# Patient Record
Sex: Female | Born: 1950
Health system: Southern US, Community
[De-identification: ages and names within clinical notes are randomized; demographics above are authoritative.]

## PROBLEM LIST (undated history)

## (undated) DIAGNOSIS — Z9109 Other allergy status, other than to drugs and biological substances: Secondary | ICD-10-CM

## (undated) DIAGNOSIS — Z9289 Personal history of other medical treatment: Secondary | ICD-10-CM

## (undated) DIAGNOSIS — H269 Unspecified cataract: Secondary | ICD-10-CM

## (undated) DIAGNOSIS — R110 Nausea: Secondary | ICD-10-CM

## (undated) DIAGNOSIS — G43109 Migraine with aura, not intractable, without status migrainosus: Secondary | ICD-10-CM

## (undated) DIAGNOSIS — T7840XA Allergy, unspecified, initial encounter: Secondary | ICD-10-CM

## (undated) DIAGNOSIS — Z8619 Personal history of other infectious and parasitic diseases: Secondary | ICD-10-CM

## (undated) DIAGNOSIS — F32A Depression, unspecified: Secondary | ICD-10-CM

## (undated) DIAGNOSIS — G709 Myoneural disorder, unspecified: Secondary | ICD-10-CM

## (undated) DIAGNOSIS — E785 Hyperlipidemia, unspecified: Secondary | ICD-10-CM

## (undated) DIAGNOSIS — F419 Anxiety disorder, unspecified: Secondary | ICD-10-CM

## (undated) DIAGNOSIS — I10 Essential (primary) hypertension: Secondary | ICD-10-CM

## (undated) DIAGNOSIS — F329 Major depressive disorder, single episode, unspecified: Secondary | ICD-10-CM

## (undated) DIAGNOSIS — K219 Gastro-esophageal reflux disease without esophagitis: Secondary | ICD-10-CM

## (undated) DIAGNOSIS — R748 Abnormal levels of other serum enzymes: Secondary | ICD-10-CM

## (undated) DIAGNOSIS — G039 Meningitis, unspecified: Secondary | ICD-10-CM

## (undated) HISTORY — DX: Personal history of other medical treatment: Z92.89

## (undated) HISTORY — DX: Hyperlipidemia, unspecified: E78.5

## (undated) HISTORY — DX: Essential (primary) hypertension: I10

## (undated) HISTORY — DX: Nausea: R11.0

## (undated) HISTORY — DX: Myoneural disorder, unspecified: G70.9

## (undated) HISTORY — DX: Depression, unspecified: F32.A

## (undated) HISTORY — PX: NASAL SINUS SURGERY: SHX719

## (undated) HISTORY — DX: Migraine with aura, not intractable, without status migrainosus: G43.109

## (undated) HISTORY — DX: Gastro-esophageal reflux disease without esophagitis: K21.9

## (undated) HISTORY — DX: Anxiety disorder, unspecified: F41.9

## (undated) HISTORY — DX: Allergy, unspecified, initial encounter: T78.40XA

## (undated) HISTORY — DX: Major depressive disorder, single episode, unspecified: F32.9

## (undated) HISTORY — DX: Unspecified cataract: H26.9

## (undated) HISTORY — DX: Personal history of other infectious and parasitic diseases: Z86.19

---

## 2000-10-28 ENCOUNTER — Other Ambulatory Visit: Admission: RE | Admit: 2000-10-28 | Discharge: 2000-10-28 | Payer: Self-pay | Admitting: Obstetrics and Gynecology

## 2001-11-07 ENCOUNTER — Other Ambulatory Visit: Admission: RE | Admit: 2001-11-07 | Discharge: 2001-11-07 | Payer: Self-pay | Admitting: Obstetrics and Gynecology

## 2002-11-13 ENCOUNTER — Other Ambulatory Visit: Admission: RE | Admit: 2002-11-13 | Discharge: 2002-11-13 | Payer: Self-pay | Admitting: Obstetrics and Gynecology

## 2003-02-13 ENCOUNTER — Inpatient Hospital Stay (HOSPITAL_COMMUNITY): Admission: EM | Admit: 2003-02-13 | Discharge: 2003-02-17 | Payer: Self-pay | Admitting: Emergency Medicine

## 2003-03-19 ENCOUNTER — Ambulatory Visit (HOSPITAL_COMMUNITY): Admission: RE | Admit: 2003-03-19 | Discharge: 2003-03-19 | Payer: Self-pay | Admitting: Neurology

## 2003-03-19 ENCOUNTER — Encounter (INDEPENDENT_AMBULATORY_CARE_PROVIDER_SITE_OTHER): Payer: Self-pay | Admitting: *Deleted

## 2003-04-02 ENCOUNTER — Encounter (INDEPENDENT_AMBULATORY_CARE_PROVIDER_SITE_OTHER): Payer: Self-pay | Admitting: *Deleted

## 2003-04-02 ENCOUNTER — Encounter (INDEPENDENT_AMBULATORY_CARE_PROVIDER_SITE_OTHER): Payer: Self-pay | Admitting: Neurology

## 2003-04-02 ENCOUNTER — Ambulatory Visit (HOSPITAL_COMMUNITY): Admission: RE | Admit: 2003-04-02 | Discharge: 2003-04-02 | Payer: Self-pay | Admitting: Neurology

## 2003-05-08 ENCOUNTER — Encounter: Admission: RE | Admit: 2003-05-08 | Discharge: 2003-06-24 | Payer: Self-pay | Admitting: Neurology

## 2004-02-26 ENCOUNTER — Other Ambulatory Visit: Admission: RE | Admit: 2004-02-26 | Discharge: 2004-02-26 | Payer: Self-pay | Admitting: Obstetrics and Gynecology

## 2005-04-08 ENCOUNTER — Other Ambulatory Visit: Admission: RE | Admit: 2005-04-08 | Discharge: 2005-04-08 | Payer: Self-pay | Admitting: Obstetrics and Gynecology

## 2005-11-04 ENCOUNTER — Encounter: Admission: RE | Admit: 2005-11-04 | Discharge: 2005-11-04 | Payer: Self-pay | Admitting: Gastroenterology

## 2006-05-10 ENCOUNTER — Other Ambulatory Visit: Admission: RE | Admit: 2006-05-10 | Discharge: 2006-05-10 | Payer: Self-pay | Admitting: Obstetrics and Gynecology

## 2007-05-12 ENCOUNTER — Other Ambulatory Visit: Admission: RE | Admit: 2007-05-12 | Discharge: 2007-05-12 | Payer: Self-pay | Admitting: Obstetrics and Gynecology

## 2008-03-29 HISTORY — PX: LAPAROSCOPIC CHOLECYSTECTOMY: SUR755

## 2008-05-17 ENCOUNTER — Other Ambulatory Visit: Admission: RE | Admit: 2008-05-17 | Discharge: 2008-05-17 | Payer: Self-pay | Admitting: Obstetrics and Gynecology

## 2008-11-19 ENCOUNTER — Emergency Department (HOSPITAL_BASED_OUTPATIENT_CLINIC_OR_DEPARTMENT_OTHER): Admission: EM | Admit: 2008-11-19 | Discharge: 2008-11-19 | Payer: Self-pay | Admitting: Emergency Medicine

## 2008-11-19 ENCOUNTER — Ambulatory Visit: Payer: Self-pay | Admitting: Diagnostic Radiology

## 2009-07-11 HISTORY — PX: ABDOMINOPLASTY: SUR9

## 2010-07-04 LAB — DIFFERENTIAL
Basophils Absolute: 0.1 10*3/uL (ref 0.0–0.1)
Basophils Relative: 1 % (ref 0–1)
Eosinophils Absolute: 0.1 10*3/uL (ref 0.0–0.7)
Eosinophils Relative: 1 % (ref 0–5)
Lymphocytes Relative: 16 % (ref 12–46)
Lymphs Abs: 1.8 10*3/uL (ref 0.7–4.0)
Monocytes Absolute: 0.7 10*3/uL (ref 0.1–1.0)
Monocytes Relative: 7 % (ref 3–12)
Neutro Abs: 8.2 10*3/uL — ABNORMAL HIGH (ref 1.7–7.7)
Neutrophils Relative %: 75 % (ref 43–77)

## 2010-07-04 LAB — COMPREHENSIVE METABOLIC PANEL
ALT: 18 U/L (ref 0–35)
AST: 25 U/L (ref 0–37)
Albumin: 4.1 g/dL (ref 3.5–5.2)
Alkaline Phosphatase: 106 U/L (ref 39–117)
BUN: 16 mg/dL (ref 6–23)
CO2: 28 mEq/L (ref 19–32)
Calcium: 9.5 mg/dL (ref 8.4–10.5)
Chloride: 103 mEq/L (ref 96–112)
Creatinine, Ser: 0.7 mg/dL (ref 0.4–1.2)
GFR calc Af Amer: >60 mL/min (ref >60)
GFR calc non Af Amer: >60 mL/min (ref >60)
Glucose, Bld: 90 mg/dL (ref 70–99)
Potassium: 4 mEq/L (ref 3.5–5.1)
Sodium: 140 mEq/L (ref 135–145)
Total Bilirubin: 0.4 mg/dL (ref 0.3–1.2)
Total Protein: 7.3 g/dL (ref 6.0–8.3)

## 2010-07-04 LAB — URINALYSIS, ROUTINE W REFLEX MICROSCOPIC
Bilirubin Urine: NEGATIVE
Glucose, UA: NEGATIVE mg/dL
Hgb urine dipstick: NEGATIVE
Ketones, ur: NEGATIVE mg/dL
Nitrite: NEGATIVE
Protein, ur: NEGATIVE mg/dL
Specific Gravity, Urine: 1.014 (ref 1.005–1.030)
Urobilinogen, UA: 0.2 mg/dL (ref 0.0–1.0)
pH: 7 (ref 5.0–8.0)

## 2010-07-04 LAB — URINE CULTURE: Colony Count: 3000

## 2010-07-04 LAB — CBC
HCT: 39.9 % (ref 36.0–46.0)
Hemoglobin: 14 g/dL (ref 12.0–15.0)
MCHC: 35.2 g/dL (ref 30.0–36.0)
MCV: 90.5 fL (ref 78.0–100.0)
Platelets: 91 10*3/uL — ABNORMAL LOW (ref 150–400)
RBC: 4.41 MIL/uL (ref 3.87–5.11)
RDW: 12 % (ref 11.5–15.5)
WBC: 10.9 10*3/uL — ABNORMAL HIGH (ref 4.0–10.5)

## 2010-07-04 LAB — POCT CARDIAC MARKERS
CKMB, poc: <1 ng/mL — ABNORMAL LOW (ref 1.0–8.0)
CKMB, poc: <1 ng/mL — ABNORMAL LOW (ref 1.0–8.0)
Myoglobin, poc: 38 ng/mL (ref 12–200)
Myoglobin, poc: 54.1 ng/mL (ref 12–200)
Troponin i, poc: <0.05 ng/mL (ref 0.00–0.09)
Troponin i, poc: <0.05 ng/mL (ref 0.00–0.09)

## 2010-07-04 LAB — URINE MICROSCOPIC-ADD ON

## 2010-07-04 LAB — LIPASE, BLOOD: Lipase: 61 U/L (ref 23–300)

## 2010-07-04 LAB — D-DIMER, QUANTITATIVE: D-Dimer, Quant: <0.22 ug/mL-FEU (ref 0.00–0.48)

## 2010-08-14 NOTE — H&P (Signed)
NAME:  [name], [name]                             ACCOUNT NO.:  [id number]   MEDICAL RECORD NO.:  [id number]                   PATIENT TYPE:  INP   LOCATION:  0460                                 FACILITY:  [facility]   PHYSICIAN:  Corinna L. Lendell Caprice, MD             DATE OF BIRTH:  09-28-1950   DATE OF ADMISSION:  02/13/2003  DATE OF DISCHARGE:                                HISTORY & PHYSICAL   CHIEF COMPLAINT:  Back pain and vomiting.   HISTORY OF PRESENT ILLNESS:  [name] is a 60 year old white female patient  of Dr. Duwayne Heck L. Mahaffey with a several-week history of low back pain.  It has worsened over the past few days, despite being on Percocet and  Celebrex.  She is also having bilateral hip pain.  She has had nausea,  vomiting and anorexia for the past three days.  She is unable to keep even  her medications down, despite Phenergan suppositories.  The pain is so bad  that it has kept her up at nights and she is miserable.  She also had a few  episodes of diarrhea yesterday.   PAST MEDICAL HISTORY:  Chronic sinusitis and allergies for which she  receives weekly shots.   MEDICATIONS:  Phenergan suppositories, Allegra-D, Percocet, Celebrex.   ALLERGIES:  Allergies to COMPAZINE.   SOCIAL HISTORY:  She is married.  She does not work.  She does not smoke,  drink or use drugs.   FAMILY HISTORY:  Family history is noncontributory.   REVIEW OF SYSTEMS:  CONSTITUTIONAL:  No fevers or chills, although she has  been febrile here in the emergency room.  HEENT:  She has had a headache  over the past few days.  She has a lot of postnasal drip because she has  been unable to take her Allegra-D.  RESPIRATORY:  No cough or shortness of  breath.  CARDIOVASCULAR:  No chest pains or palpitations.  GI:  As above.  GU:  No dysuria.  MUSCULOSKELETAL:  As above.  PSYCHIATRIC:  No depression.  NEUROLOGIC:  No seizures.  ENDOCRINE:  No diabetes.  SKIN:  No rash.  HEMATOLOGIC:  No history of  DVT.   PHYSICAL EXAMINATION:  VITAL SIGNS:  On physical examination, her  temperature initially in the emergency room was afebrile but she  subsequently had a temperature of 101.1, blood pressure 158/80, pulse 80,  respiratory rate 16, pulse oximetry 94%.  GENERAL:  In general, patient is very uncomfortable appearing and has  periodic dry heaves.  HEENT:  Normocephalic, atraumatic.  Her eyes are closed with a rag over her  head but she has pupils equal, round and reactive to light.  Sclerae  nonicteric.  She has moist mucous membranes.  NECK:  Neck is supple.  No lymphadenopathy.  LUNGS:  Lungs are clear to auscultation bilaterally without wheezes, rhonchi  or rales.  CARDIOVASCULAR:  Regular rate  and rhythm without murmurs, gallops or rubs.  ABDOMEN:  Normal bowel sounds.  Soft, nontender and nondistended.  GU AND RECTAL:  Deferred.  EXTREMITIES:  No clubbing, cyanosis, or edema.  BACK:  She does have some tenderness of her lumbar spine.  Straight leg  raise is negative.  MUSCULOSKELETAL:  On joint exam, she has good range of motion of the hips  but she is tender over both trochanters.   LABORATORIES:  Her UA has a few squamous epithelial cells, small leukocytes,  negative nitrites, 7 to 10 white cells, a few bacteria.  Complete metabolic  panel is significant for a potassium of 3.3 and a BUN of 26; her creatinine  is normal at 0.7, otherwise, essentially normal.  CBC is normal.   She reportedly had a pelvis film done and lumbar spine films done; I do not  have these results, but the MRI wet reading showed small herniated nucleus  pulposus at L2-3, L3-4 and L4-5 with possible left L5 radiculopathy.   ASSESSMENT AND PLAN:  1. Back pain/herniated nucleus pulposus with possible radiculopathy.  The     patient was seen by Dr. Stefani Dama here in the emergency room, who     recommends conservative treatment.  I will give IV pain medications, IV     steroids, IV Valium until she is  able to take p.o. medications.  He     apparently will continue to follow.  2. Nausea, vomiting and dehydration possibly secondary to pain medications     versus a viral gastroenteritis, as she has a fever currently.  She will     get IV fluids, IV Protonix, n.p.o., except for ice chips, and advance her     diet as tolerated, Zofran, as the Phenergan has not worked very well here     in the emergency room or at home.  3. Fever possibly due to above.  I will, however, check a chest x-ray.  The     urinalysis is not terribly consistent with a bad urinary tract infection,     however, I will repeat this, as it looks a bit contaminated.  4. Chronic sinusitis.  The patient will get Flonase until she is able to     take Allegra-D p.o.                                               Corinna L. Lendell Caprice, MD    CLS/MEDQ  D:  02/13/2003  T:  02/14/2003  Job:  [id number]   cc:   Duwayne Heck L. Mahaffey, M.D.  [address]  [place]  [place] [address]  Fax: [phone]

## 2010-08-14 NOTE — Consult Note (Signed)
NAMENOEL, HENANDEZ                             ACCOUNT NO.:  [id number]   MEDICAL RECORD NO.:  [id number]                   PATIENT TYPE:  INP   LOCATION:  0460                                 FACILITY:  [facility]   PHYSICIAN:  Stefani Dama, M.D.               DATE OF BIRTH:  [birth year]   DATE OF CONSULTATION:  02/13/2003  DATE OF DISCHARGE:                                   CONSULTATION   NEUROSURGERY CONSULTATION:   REQUESTOR:  Carleene Cooper, M.D.   REASON FOR REQUEST:  Back pain, bulging disk.   HISTORY OF PRESENT ILLNESS:  Patient is a 60 year old white female who has a  history of back pain that started a few weeks ago while she was dancing  holding her grandson, the pain localized to her lumbar spine, gradually  resolved after a few days but came back a few days later and was quite  severe in her back and radiating into both hips.  She denies any numbness or  tingling in the legs, denies any weakness in the lower extremities, bowel  and bladder control has not been a problem.  The pain became so severe that  patient was becoming gradually immobilized and presented to the emergency  room at the request of her primary care physician.  An MRI of the lumbar  spine was performed at [facility], this has been reviewed by  me, it demonstrates that she has a mild disk bulge at the level of L4-L5  without any evidence of neurogenic compression.   The patient complains of pain primarily in her low back radiating to both  hips.  There is no numbness and weakness of her legs.  She notes that the  pain is not relieved in any position.   PAST MEDICAL HISTORY:  Not obtained at the current time.   PHYSICAL EXAMINATION:  Her physical examination reveals that she is alert  and arousable to voice; however, she has considerable slurring of speech  secondary to the significant narcotic medications given in the emergency  department.  She moves very slowly and cautiously and can  turn onto her side  with a significant amount of back pain and spasm.  She is able to bear  weight on her legs and her motor strength is good in the iliopsoas, quad,  tibialis anterior, and her gastrocs.  Tone and bulk are normal.  Deep tendon  reflexes are 2+ in the patellae and the Achilles.  Babinski's are downgoing.  Palpation of her back reproduces some modest back pain in the lumbar spine,  no significant paraspinous muscle spasm is noted, however, there is great  tenderness over the palpation of the greater trochanters and Patrick's sign  is markedly positive.  The straight leg raising is negative to 60 degrees  save for some localized back pain.   IMPRESSION:  Patient has evidence of significant pain  in both hip regions  and in the low back to some degree.  She does not have a surgical lesion.  She does have a slight bulge of the disk at L4-5 which is normal considering  her age and I am not certain is the cause of her significant back and hip  pain.  At this point I would advise conservative treatment using a  nonsteroidal anti-inflammatory, some mild muscle relaxers such as Flexeril  and a pain medication for acute pain.  She could be considered to have a  lumbar epidural steroid injection if no other contraindication exists.  Furthermore, I feel evaluation of both hips is appropriate to rule out  particularly avascular  necrosis of the hips.  I am not certain that the patient has any underlying  cause that would lead this into consideration.  Other causes of an  inflammatory arthropathy should be worked up including evaluation with a sed  rate, rheumatoid factor and test for lupus if it is a consideration.  Follow  up will be on an as-requested basis.                                               Stefani Dama, M.D.    Merla Riches  D:  02/13/2003  T:  02/14/2003  Job:  [id number]

## 2010-08-14 NOTE — Discharge Summary (Signed)
[name], [name]                             ACCOUNT NO.:  [id number]   MEDICAL RECORD NO.:  [id number]                   PATIENT TYPE:  INP   LOCATION:  0460                                 FACILITY:  [facility]   PHYSICIAN:  Jackie Plum, M.D.             DATE OF BIRTH:  May 28, 1950   DATE OF ADMISSION:  02/13/2003  DATE OF DISCHARGE:  02/17/2003                                 DISCHARGE SUMMARY   DISCHARGE DIAGNOSES:  1. Nausea, vomiting, anorexia secondary to pain medication, possibly.     Resolved at time of discharge.  2. Low back pain improved significantly.  3. Left hip pain likely musculoskeletal.  4. History of chronic sinusitis and allergies.   DISCHARGE MEDICATIONS:  Darvocet-N 100 one to two tablets p.o. q.4-6h.  p.r.n. and prednisone 40 mg p.o. once daily for today and tomorrow.  Patient  is to resume her preadmission medications.   RADIOLOGIC INVESTIGATIONS:  1. MRI of the spine showed small to moderate right paracentral/foraminal     disk protrusion at T12-L1 which may contact central nerve roots.  2. Mild diffuse disk bulges at L2-3, L3-4, L4-5 with very small eccentric     paracentral/foraminal disk protrusions at these levels.  No evidence of     central or neural foraminal narrowing.  3. Bilateral hip x-rays on February 14, 2003 showed no acute findings.   CONSULTANT:  Dr. Danielle Dess of neurosurgery.   PROCEDURES:  Not applicable.   CONDITION ON DISCHARGE:  Improved and stable.   DISCHARGE LABORATORIES:  WBC count 9.2, hemoglobin 14.4, hematocrit 41.1,  MCV 87.7, platelet count 205,000.  Sodium 135, potassium 3.5, chloride 107,  CO2 26, glucose 134, BUN 21, creatinine 0.6.   ACTIVITIES:  As tolerated.   DIET:  Regular diet.   FOLLOWUP:  Followup appointment will be with Dr. Adonis Housekeeper, patient's primary  care physician, next week.  She is to call for appointment.  She is to  report to MD if there is any increasing pain or any other problems.   REASON FOR  HOSPITALIZATION:  Nausea, vomiting and intractable low back pain.   The patient is a 60 year old Caucasian lady who presented with several-week  history of low back pain worse for last few days prior to admission as well  as bilateral hip pain.  She had been nauseous with some vomiting for 2 days  prior to admission.  Unable to keep even medication down.                                               Jackie Plum, M.D.    GO/MEDQ  D:  02/17/2003  T:  02/17/2003  Job:  [id number]   cc:   Duwayne Heck L. Mahaffey, M.D.  [address]  [place]  [place]  [address]  Fax: [phone]

## 2010-08-14 NOTE — Discharge Summary (Signed)
[name], [name]                             ACCOUNT NO.:  [id number]   MEDICAL RECORD NO.:  [id number]                   PATIENT TYPE:  INP   LOCATION:  0460                                 FACILITY[facility]   PHYSICIAN:  Jackie Plum, M.D.             DATE OF BIRTH:  [birth year]   DATE OF ADMISSION:  02/13/2003  DATE OF DISCHARGE:                                 DISCHARGE SUMMARY   CONTINUATION:  In view of patient's persistent nausea and vomiting and anterior lobar pain,  it was thought expedient to admit patient to the hospital for further  evaluation.  Please see the admission H&P dictated by Dr. ___________  February 13, 2003, for further information regarding the patient's admission  symptoms, signs and assessment and plan at time.   HOSPITAL COURSE:  The patient was admitted to the hospitalist service.  She  was seen by Dr. Danielle Dess of neurosurgery, who, after reviewing the patient and  the patient's films, told that she does not have a physical lesion and that  this bulge at L4 to L5 may be normal for her age.  He advised conservative  treatment only with a nonsteroidal anti-inflammatory with a muscle relaxant.  The plan was that should these conservative treatments fail, then lumbar  epidural steroid injection may be appropriate.  He also suggested evaluation  of the hips to rule out avascular necrosis.  With muscle relaxants, pain  medications, the patient's symptoms have improved.  She has not been  nauseous or vomiting ovary the last 24 hours prior to discharge.  She is  able to keep food down without any problems.  She has been able to ambulate  in the hallways without any significant debilitating pain.  She feels that  she is strong enough and well enough to go home today, and she is being  discharged home with outpatient follow-up.  The patient has some bilateral  hip pain which was x-rayed and results as noted above (unremarkable).  I  believe that the cause of the  patient's hip pain may be musculoskeletal and  if supportive care with pain medicines fail, at that point, an MRI may be  necessary due to the concern for avascular necrosis is not obtained.                                               Jackie Plum, M.D.    GO/MEDQ  D:  02/17/2003  T:  02/17/2003  Job:  [id number]   cc:   Stefani Dama, M.D.  [address]  [place]  [place] [address]  Fax: [phone]

## 2011-01-19 ENCOUNTER — Other Ambulatory Visit: Payer: Self-pay | Admitting: Gastroenterology

## 2011-01-26 ENCOUNTER — Ambulatory Visit
Admission: RE | Admit: 2011-01-26 | Discharge: 2011-01-26 | Disposition: A | Discharge status: Home / Self Care | Payer: 59 | Source: Ambulatory Visit | Attending: Gastroenterology | Admitting: Gastroenterology

## 2011-01-26 ENCOUNTER — Other Ambulatory Visit: Payer: Self-pay

## 2011-08-09 ENCOUNTER — Ambulatory Visit (INDEPENDENT_AMBULATORY_CARE_PROVIDER_SITE_OTHER): Payer: 59 | Admitting: Surgery

## 2011-08-09 ENCOUNTER — Encounter (INDEPENDENT_AMBULATORY_CARE_PROVIDER_SITE_OTHER): Payer: Self-pay | Admitting: Surgery

## 2011-08-09 VITALS — BP 138/82 | HR 84 | Resp 18 | Ht 62.0 in | Wt 171.0 lb

## 2011-08-09 DIAGNOSIS — K429 Umbilical hernia without obstruction or gangrene: Secondary | ICD-10-CM | POA: Insufficient documentation

## 2011-08-09 NOTE — Patient Instructions (Signed)
Umbilical Herniorrhaphy A herniorrhaphy is surgery to repair a hernia. A hernia is a gap or weakness in the muscles of your abdomen. Umbilical means that your hernia is in the area around your belly button. You might be able to feel a small bulge in your abdomen where the hernia is. You might also have pain or discomfort. If the hernia is not repaired, the gap could get bigger. Your intestines could get trapped in the gap. This can be painful. It also can lead to other health problems, such as blocked intestines. LET YOUR CAREGIVER KNOW ABOUT:  Any allergies.   All medications you are taking, including:   Herbs, eyedrops, over-the-counter medications and cream   Blood thinners (anticoagulants), aspirin or other drugs that could affect blood clotting.   Use of steroids (by mouth or as creams).   Previous problems with anesthetics, including local anesthetics.   Possibility of pregnancy, if this applies.   Any history of blood clots.   Any history of bleeding or other blood problems.   Previous surgery.   Smoking history.   Other health problems.  RISKS AND COMPLICATIONS  Short-term possibilities include:   Pain.   Excessive bleeding.   Hematoma. This is a pooling of blood under the wound.   Infection at the surgery site or of the mesh.   Numbness at the surgery site.   Swelling and bruising.   Slow healing.   Blood clots.   Intestine or bowel damage. This is rare.   Longer-term possibilities include:   Scarring.   Skin damage.   The need for additional surgery.   Another hernia.  BEFORE THE PROCEDURE  Stop using aspirin and non-steroidal anti-inflammatory drugs (NSAIDs) for pain relief. Also stop taking vitamin E. If possible, do this two weeks in advance.   If you take blood-thinners, ask your healthcare provider when you should stop taking them.   Do not eat or drink for about 8 hours before your surgery.   You might be asked to shower or wash with a  special antibacterial soap before the procedure.   Wear clothes that will be easy to put on after the surgery.   Arrive at least an hour before the surgery, or whenever your surgeon recommends. This will give you time to check in and fill out any needed paperwork.   This surgery is usually an outpatient procedure, so you will be able to go home the same day. Less often people stay overnight in the hospital after the procedure. Ask your healthcare provider what to expect. Either way, make arrangements in advance for someone to drive you home. After an outpatient procedure, you should have someone stay with you overnight.  PROCEDURE Your procedure can be done with traditional surgery. The surgeon opens the abdomen and repairs the hernia. Or, sometimes it can be done with laparoscopic surgery. Then the procedure is done through multiple small incisions, using a camera to guide the repair. Talk with your surgeon about how the hernia will be repaired.  The preparation:   You will change into a hospital gown.   You will be given an IV. A needle will be inserted in your arm. Medication can flow directly into your body through this needle.   You might be given a sedative to help you relax.   You may be given a drug that will put you to sleep during the surgery (general anesthetic). Or, your abdomen will be numbed, and you will be drowsy but awake (local   anesthetic).   For a traditional surgery (sometimes called open surgery):   Once you are pain-free, the surgeon will make a small cut (incision) in your abdomen.   The gap in the muscle wall will be repaired. The surgeon could sew muscle together over the gap. Or, a mesh or soft screen material can be used to strengthen the area. The mesh acts as a scaffolding and the body grows new strong tissue into and around it. This new tissue is what closes the gap of the hernia and prevents it from coming back.   A drain might be put in. Fluid sometimes  collects under the wound as it heals. The drain helps get the fluid out of the area. A drain will probably be used if your hernia is large.   The surgeon will close the incision with small stitches.   For a laparoscopic surgery:   One you are pain-free, the surgeon will make a small incision in your abdomen.   A thin tube with a tiny camera attached to it will be inserted into the abdomen through the incision. What the camera "sees" is projected on a screen in the room. This gives the surgeon a good view of the hernia.   Other small incisions will be made so the surgeon can insert small tools that are used to repair the hernia.   The incisions will be closed with small stitches.  AFTER THE PROCEDURE  You will be stay in a recovery area until the anesthesia has worn off. Your blood pressure and pulse will be checked every so often.   You might be asked to get up and try walking.   Sometimes people can go home the same day. For others, an overnight stay is needed.  HOME CARE INSTRUCTIONS   Take any medication that your surgeon prescribed. Follow the directions carefully. Take all of the medication.   Ask your surgeon whether you can take over-the-counter medicines for pain, discomfort or fever. Do not take aspirin unless your healthcare team says that you should. Aspirin increases the chances of bleeding.   Do not get the incision area wet for the first few days after surgery (or until your surgeon says it is OK).   Avoid lifting heavy objects (more than 10 lbs, 4.5 kilograms) for 6 to 8 weeks after your surgery.   Expect some pain when climbing stairs for several days after surgery.   Avoid sexual activity for a few weeks. It can be uncomfortable or painful.   You should be able to drive within a few days. However, do not drive until you are no longer taking pain medicine. It can make you drowsy. It also can slow your reaction time.   You should be able to resume normal activity in  a few days. When you can return to work will depend on the type of work you do. You can go back to a desk job much sooner than you can return to work that requires physical labor. Talk about this with your healthcare provider.  SEEK MEDICAL CARE IF:   You notice blood or fluid leaking from the wound.   The area around the incision becomes red or swollen.   You are having problems urinating.   You become nauseous or throw up for more than two days after the surgery.   You develop a fever of more than 100.5 F (38.1 C).  SEEK IMMEDIATE MEDICAL CARE IF:  You develop a fever of more than   102.0 F (38.9 C). Document Released: 06/11/2008 Document Revised: 03/04/2011 Document Reviewed: 06/11/2008 [employer] Patient Information 2012 [employer]. 

## 2011-08-09 NOTE — Progress Notes (Signed)
Chief Complaint  Patient presents with  . New Evaluation    evaluate for possible ventral hernia after abdominoplasty - referral from Dr. Delia Chimes    HISTORY: The patient is a 61 year old white female who underwent abdominoplasty in May 2012. Approximately 2 weeks following her procedure she had an episode of nausea and vomiting. She felt a "pop" at the level of the umbilicus.  Patient then noted a small bulge which was more evident in a standing position then when in a recumbent position. This has not significantly changed over the past year. It does cause occasional discomfort when pressure is applied to this point on the abdominal wall.  Previous abdominal surgery includes laparoscopic cholecystectomy.  Past Medical History  Diagnosis Date  . GERD (gastroesophageal reflux disease)   . Neuromuscular disorder     arachnoiditis     Current Outpatient Prescriptions  Medication Sig Dispense Refill  . ARIPiprazole (ABILIFY) 2 MG tablet Take 2 mg by mouth daily.      . clonazePAM (KLONOPIN) 0.5 MG tablet Take 0.5 mg by mouth 2 (two) times daily.      Marland Kitchen Dexlansoprazole (DEXILANT PO) Take 40 mg by mouth daily.      . DULoxetine (CYMBALTA) 30 MG capsule Take 30 mg by mouth daily.      . DULoxetine (CYMBALTA) 60 MG capsule Take 60 mg by mouth at bedtime.      . fentaNYL (DURAGESIC - DOSED MCG/HR) 100 MCG/HR Place 1 patch onto the skin every 3 (three) days.      Marland Kitchen Fexofenadine-Pseudoephedrine (ALLEGRA-D PO) Take by mouth as needed.      . gabapentin (NEURONTIN) 600 MG tablet Take 600 mg by mouth. x5      . HYDROcodone-acetaminophen (VICODIN) 5-500 MG per tablet Take 2 tablets by mouth every 6 (six) hours as needed.         Allergies  Allergen Reactions  . Compazine (Prochlorperazine)     hyperactivity  . Morphine And Related Hives and Itching     No family history on file.   History   Social History  . Marital Status: Married    Spouse Name: N/A    Number of Children: N/A    . Years of Education: N/A   Social History Main Topics  . Smoking status: Former Smoker    Quit date: 08/09/1986  . Smokeless tobacco: None  . Alcohol Use: No  . Drug Use: No  . Sexually Active: None   Other Topics Concern  . None   Social History Narrative  . None     REVIEW OF SYSTEMS - PERTINENT POSITIVES ONLY: Intermittent pain with pressure at the umbilicus, no signs or symptoms of intestinal obstruction, no prior hernia repairs  EXAM: Filed Vitals:   08/09/11 1525  BP: 138/82  Pulse: 84  Resp: 18    HEENT: normocephalic; pupils equal and reactive; sclerae clear; dentition good; mucous membranes moist NECK:  symmetric on extension; no palpable anterior or posterior cervical lymphadenopathy; no supraclavicular masses; no tenderness CHEST: clear to auscultation bilaterally without rales, rhonchi, or wheezes CARDIAC: regular rate and rhythm without significant murmur; peripheral pulses are full ABDOMEN: soft without distension; bowel sounds present; no mass; no hepatosplenomegaly; Well-healed surgical incisions consistent with previous abdominoplasty; patient is examined both standing and recumbent. There is slight tenderness at the umbilicus. With Valsalva I can appreciate a small umbilical hernia. The fascial defect measured less than 1 cm. It is reducible. It is mildly tender. EXT:  non-tender  without edema; no deformity NEURO: no gross focal deficits; no sign of tremor   LABORATORY RESULTS: See [facility] (CHL-Epic) for most recent results   RADIOLOGY RESULTS: See [facility] (CHL-Epic) for most recent results   IMPRESSION: #1 reducible umbilical hernia, mildly symptomatic #2 status post abdominoplasty #3 status post laparoscopic cholecystectomy  PLAN: I provided the patient with written literature regarding umbilical hernia repair. We discussed the options for management. At this point the patient has a very small fascial defect at the level of  the umbilicus which likely measures less than 1 cm in size. Patient has mild pain on a rare occasion. The hernia has been essentially stable for one year.  Open repair of her umbilical hernia would put the umbilicus at risk for ischemia or infarction. I explained this to the patient.  An alternative approach would be laparoscopic repair. Certainly this would be a more involved procedure and would necessitate at least an overnight hospital stay.  I offered to reevaluate the patient in 6 months. If the hernia has not increased in size and she has not become more symptomatic, I believe it can be observed. Certainly if the hernia enlarges or if she experiences more discomfort or develops any signs or symptoms of intestinal obstruction, and the patient will have to undergo repair. At that point I would recommend a laparoscopic approach.  Patient will return in 6 months for evaluation. She will contact me in the interim if she develops more symptoms.  Velora Heckler, MD, FACS General & Endocrine Surgery [facility]   Visit Diagnoses: 1. Umbilical hernia     Primary Care Physician: Raynelle Jan., MD, MD  Plastic Surgeon:  Dr. Delia Chimes

## 2011-10-13 LAB — HM MAMMOGRAPHY

## 2011-11-01 LAB — HM PAP SMEAR: HM Pap smear: NEGATIVE

## 2012-03-29 DIAGNOSIS — Z9289 Personal history of other medical treatment: Secondary | ICD-10-CM

## 2012-03-29 HISTORY — DX: Personal history of other medical treatment: Z92.89

## 2012-05-03 ENCOUNTER — Encounter (HOSPITAL_COMMUNITY): Payer: Self-pay | Admitting: *Deleted

## 2012-05-03 ENCOUNTER — Other Ambulatory Visit: Payer: Self-pay | Admitting: Gastroenterology

## 2012-05-03 NOTE — Pre-Procedure Instructions (Signed)
Your procedure is scheduled ZO:[id number] 07,2014 Report to [facility] Admitting at:1100Call this number if you have problems morning of your procedure:[phone]  Follow all bowel prep instructions per your doctor's orders.  Do not eat or drink anything after midnight the night before your procedure. You may brush your teeth, rinse out your mouth, but no water, no food, no chewing gum, no mints, no candies, no chewing tobacco.     Take these medicines the morning of your procedure with A SIP OF WATER:Gabapentin,Dexilant and mat take pain medication  Please make arrangements for a responsible person to drive you home after the procedure. You cannot go home by cab/taxi. We recommend you have someone with you at home the first 24 hours after your procedure. Driver for procedure is spouse Tondra Reierson [phone]  LEAVE ALL VALUABLES, JEWELRY, BILLFOLD AT HOME.  NO DENTURES, CONTACT LENSES ALLOWED IN THE ENDOSCOPY ROOM.   YOU MAY WEAR DEODORANT, PLEASE REMOVE ALL JEWELRY, WATCHES RINGS, BODY PIERCINGS AND LEAVE AT HOME.   WOMEN: NO MAKE-UP, LOTIONS PERFUMES

## 2012-05-05 ENCOUNTER — Encounter (HOSPITAL_COMMUNITY): Payer: Self-pay | Admitting: *Deleted

## 2012-05-05 ENCOUNTER — Encounter (HOSPITAL_COMMUNITY): Payer: Self-pay | Admitting: Anesthesiology

## 2012-05-05 ENCOUNTER — Ambulatory Visit (HOSPITAL_COMMUNITY): Payer: 59 | Admitting: Anesthesiology

## 2012-05-05 ENCOUNTER — Encounter (HOSPITAL_COMMUNITY)
Admission: RE | Disposition: A | Discharge status: Home / Self Care | Payer: Self-pay | Source: Ambulatory Visit | Attending: Gastroenterology

## 2012-05-05 ENCOUNTER — Encounter (HOSPITAL_COMMUNITY): Payer: Self-pay | Admitting: Gastroenterology

## 2012-05-05 ENCOUNTER — Ambulatory Visit (HOSPITAL_COMMUNITY)
Admission: RE | Admit: 2012-05-05 | Discharge: 2012-05-05 | Disposition: A | Discharge status: Home / Self Care | Payer: 59 | Source: Ambulatory Visit | Attending: Gastroenterology | Admitting: Gastroenterology

## 2012-05-05 DIAGNOSIS — R748 Abnormal levels of other serum enzymes: Secondary | ICD-10-CM | POA: Insufficient documentation

## 2012-05-05 DIAGNOSIS — R1013 Epigastric pain: Secondary | ICD-10-CM | POA: Insufficient documentation

## 2012-05-05 DIAGNOSIS — K219 Gastro-esophageal reflux disease without esophagitis: Secondary | ICD-10-CM | POA: Insufficient documentation

## 2012-05-05 HISTORY — DX: Meningitis, unspecified: G03.9

## 2012-05-05 HISTORY — DX: Abnormal levels of other serum enzymes: R74.8

## 2012-05-05 HISTORY — DX: Other allergy status, other than to drugs and biological substances: Z91.09

## 2012-05-05 HISTORY — PX: EUS: SHX5427

## 2012-05-05 SURGERY — UPPER ENDOSCOPIC ULTRASOUND (EUS) LINEAR
Anesthesia: General

## 2012-05-05 MED ORDER — SODIUM CHLORIDE 0.9 % IV SOLN: INTRAVENOUS | Status: DC

## 2012-05-05 MED ORDER — MIDAZOLAM HCL 5 MG/5ML IJ SOLN
INTRAMUSCULAR | Status: DC | PRN
  Administered 2012-05-05: 2 mg via INTRAVENOUS

## 2012-05-05 MED ORDER — LACTATED RINGERS IV SOLN
INTRAVENOUS | Status: DC | PRN
  Administered 2012-05-05: 12:00:00 via INTRAVENOUS

## 2012-05-05 MED ORDER — PROPOFOL 10 MG/ML IV BOLUS
INTRAVENOUS | Status: DC | PRN
  Administered 2012-05-05: 120 mg via INTRAVENOUS

## 2012-05-05 MED ORDER — DEXAMETHASONE SODIUM PHOSPHATE 10 MG/ML IJ SOLN
INTRAMUSCULAR | Status: DC | PRN
  Administered 2012-05-05: 10 mg via INTRAVENOUS

## 2012-05-05 MED ORDER — FENTANYL CITRATE 0.05 MG/ML IJ SOLN
INTRAMUSCULAR | Status: DC | PRN
  Administered 2012-05-05: 100 ug via INTRAVENOUS

## 2012-05-05 MED ORDER — ONDANSETRON HCL 4 MG/2ML IJ SOLN
INTRAMUSCULAR | Status: DC | PRN
  Administered 2012-05-05: 4 mg via INTRAVENOUS

## 2012-05-05 MED ORDER — LACTATED RINGERS IV SOLN
INTRAVENOUS | Status: DC
  Administered 2012-05-05: 12:00:00 via INTRAVENOUS

## 2012-05-05 MED ORDER — SUCCINYLCHOLINE CHLORIDE 20 MG/ML IJ SOLN
INTRAMUSCULAR | Status: DC | PRN
  Administered 2012-05-05: 100 mg via INTRAVENOUS

## 2012-05-05 MED ORDER — ONDANSETRON HCL 4 MG/2ML IJ SOLN: 4.0000 mg | Freq: Once | INTRAMUSCULAR | Status: DC | PRN

## 2012-05-05 MED ORDER — PROPOFOL 10 MG/ML IV BOLUS: INTRAVENOUS | Status: DC | PRN

## 2012-05-05 NOTE — Op Note (Signed)
[facility] [address] [place], [address]   ENDOSCOPIC ULTRASOUND PROCEDURE REPORT  PATIENT: [name], [name]  MR#: [id number] BIRTHDATE: 26-Nov-1950  GENDER: Female ENDOSCOPIST: Jeani Hawking, MD REFERRED BY: PROCEDURE DATE:  05/05/2012 PROCEDURE:   Upper EUS ASA CLASS:      Class II INDICATIONS:   1.  Epigastric pain and abnormal liver enzymes. MEDICATIONS: See Anesthesia Report.  DESCRIPTION OF PROCEDURE:   After the risks benefits and alternatives of the procedure were  explained, informed consent was obtained. The patient was then placed in the left, lateral, decubitus postion and IV sedation was administered. Throughout the procedure, the patients blood pressure, pulse and oxygen saturations were monitored continuously.  Under direct visualization, the Pentax Linear [id number]  endoscope was introduced through the mouth  and advanced to the second portion of the duodenum .  Water was used as necessary to provide an acoustic interface.  Upon completion of the imaging, water was removed and the patient was sent to the recovery room in satisfactory condition.   FINDINGS: An excellent view of the CBD was able to be obtained.  The entire CBD was clearly visualized and there was no evidence of any retained stones or sludge.  The CBD diameter was noted to be a 9-10 mm.  The PD was also normal in caliber and course.  No evidence of any pancreatic masses.   The scope was then withdrawn from the patient and the procedure completed.  COMPLICATIONS: There were no complications. ENDOSCOPIC VISUALIZATION:    ENDOSCOPIC IMPRESSION: 1) Midly dilated CBD at 9-10 mm.  No other abnormalities identified.  RECOMMENDATIONS: 1) Repeat liver enzymes in one week. 2) ? trial of a PPI. 3) Follow up with Dr.  Loreta Ave as scheduled.   _______________________________ eSignedJeani Hawking, MD 05/05/2012 1:10 PM

## 2012-05-05 NOTE — Anesthesia Postprocedure Evaluation (Signed)
Anesthesia Post Note  Patient: [name]  Procedure(s) Performed: Procedure(s) (LRB): UPPER ENDOSCOPIC ULTRASOUND (EUS) LINEAR (N/A) ENDOSCOPIC RETROGRADE CHOLANGIOPANCREATOGRAPHY (ERCP) (N/A)  Anesthesia type: General  Patient location: PACU  Post pain: Pain level controlled  Post assessment: Patient's Cardiovascular Status Stable  Last Vitals:  Filed Vitals:   05/05/12 1340  BP: 125/79  Pulse:   Temp:   Resp: 14    Post vital signs: Reviewed and stable  Level of consciousness: alert  Complications: No apparent anesthesia complications

## 2012-05-05 NOTE — Preoperative (Signed)
Beta Blockers   Reason not to administer Beta Blockers:Not Applicable 

## 2012-05-05 NOTE — Anesthesia Preprocedure Evaluation (Signed)
Anesthesia Evaluation  Patient identified by MRN, date of birth, ID band Patient awake    Reviewed: Allergy & Precautions, H&P , NPO status , Patient's Chart, lab work & pertinent test results, reviewed documented beta blocker date and time   Airway Mallampati: II TM Distance: >3 FB Neck ROM: full    Dental   Pulmonary neg pulmonary ROS,  breath sounds clear to auscultation        Cardiovascular negative cardio ROS  Rhythm:regular     Neuro/Psych  Neuromuscular disease negative neurological ROS  negative psych ROS   GI/Hepatic Neg liver ROS, GERD-  Medicated and Controlled,  Endo/Other  negative endocrine ROS  Renal/GU negative Renal ROS  negative genitourinary   Musculoskeletal   Abdominal   Peds  Hematology negative hematology ROS (+)   Anesthesia Other Findings See surgeon's H&P   Reproductive/Obstetrics negative OB ROS                           Anesthesia Physical Anesthesia Plan  ASA: II  Anesthesia Plan: General   Post-op Pain Management:    Induction: Intravenous  Airway Management Planned: Oral ETT  Additional Equipment:   Intra-op Plan:   Post-operative Plan: Extubation in OR  Informed Consent: I have reviewed the patients History and Physical, chart, labs and discussed the procedure including the risks, benefits and alternatives for the proposed anesthesia with the patient or authorized representative who has indicated his/her understanding and acceptance.   Dental Advisory Given  Plan Discussed with: CRNA and Surgeon  Anesthesia Plan Comments:         Anesthesia Quick Evaluation

## 2012-05-05 NOTE — Transfer of Care (Signed)
Immediate Anesthesia Transfer of Care Note  Patient: [name]  Procedure(s) Performed: Procedure(s) (LRB) with comments: UPPER ENDOSCOPIC ULTRASOUND (EUS) LINEAR (N/A) ENDOSCOPIC RETROGRADE CHOLANGIOPANCREATOGRAPHY (ERCP) (N/A)  Patient Location: PACU and Endoscopy Unit  Anesthesia Type:General  Level of Consciousness: awake, alert  and patient cooperative  Airway & Oxygen Therapy: Patient Spontanous Breathing and Patient connected to face mask oxygen  Post-op Assessment: Report given to PACU RN and Post -op Vital signs reviewed and stable  Post vital signs: Reviewed and stable  Complications: No apparent anesthesia complications

## 2012-05-05 NOTE — H&P (Signed)
  [name] HPI: This is a 62 year old female who was recently treated at [facility] for choledocholithiasis.  She presented with upper abdominal pain and nausea/vomiting.  Initially her liver enzymes were normal, but as her symptoms worsened she was admitted to the hospital and her enzymes were noted to be elevated.  Her TB was at 1.7, but her AP was in the 300 range as well as the AST/ALT.  She ruled out for any viral hepatidites.  The CT scan and the ultrasound was negative for any abnormalities, but the MRCP revealed two filling defects.  An ERCP was performed and the stones were extracted.  Her liver enzymes did decline, but it never normalized and her symptoms also do not resolve.  She presented to Dr. Loreta Ave and her most recent AP was at 175 and her ALT was at 37.  With her persistent symptoms the decision was made to perform an EUS +/- ERCP.    Past Medical History  Diagnosis Date  . GERD (gastroesophageal reflux disease)   . Neuromuscular disorder     arachnoiditis  . Arachnoiditis   . Environmental allergies   . Elevated liver enzymes     Past Surgical History  Procedure Date  . Abdominoplasty 07/11/09  . Cholecystectomy 2010    History reviewed. No pertinent family history.  Social History:  reports that she quit smoking about 25 years ago. She has never used smokeless tobacco. She reports that she does not drink alcohol or use illicit drugs.  Allergies:  Allergies  Allergen Reactions  . Compazine (Prochlorperazine)     hyperactivity  . Morphine And Related Hives and Itching    Medications:  Scheduled:  Continuous:   . sodium chloride    . lactated ringers 125 mL/hr at 05/05/12 1217    No results found for this or any previous visit (from the past 24 hour(s)).   No results found.  ROS:  As stated above in the HPI otherwise negative.  Blood pressure 122/68, pulse 75, temperature 98.2 F (36.8 C), temperature source Oral, resp. rate 15, height  5\' 2"  (1.575 m), weight 140 lb (63.504 kg), SpO2 93.00%.    PE: Gen: NAD, Alert and Oriented HEENT:  /AT, EOMI Neck: Supple, no LAD Lungs: CTA Bilaterally CV: RRR without M/G/R ABM: Soft, NTND, +BS Ext: No C/C/E  Assessment/Plan: 1) History of choledocholithiasis. 2) Abnormal liver enzymes. 3) Persistent nausea and RUQ pain.  Plan: 1) EUS +/- ERCP.  Renada Cronin D 05/05/2012, 12:16 PM

## 2012-05-08 ENCOUNTER — Encounter (HOSPITAL_COMMUNITY): Payer: Self-pay | Admitting: Gastroenterology

## 2012-06-26 ENCOUNTER — Encounter: Payer: Self-pay | Admitting: Obstetrics and Gynecology

## 2012-06-27 ENCOUNTER — Encounter: Payer: Self-pay | Admitting: Obstetrics and Gynecology

## 2012-06-27 ENCOUNTER — Institutional Professional Consult (permissible substitution): Payer: Self-pay | Admitting: Obstetrics and Gynecology

## 2012-07-05 ENCOUNTER — Telehealth: Payer: Self-pay | Admitting: Obstetrics and Gynecology

## 2012-07-05 ENCOUNTER — Institutional Professional Consult (permissible substitution): Payer: Self-pay | Admitting: Obstetrics and Gynecology

## 2012-07-05 NOTE — Telephone Encounter (Signed)
Pt cancelled appt for this afternoon because she is sick. Will call back to reschedule.

## 2012-07-05 NOTE — Telephone Encounter (Signed)
What was the appt for?

## 2012-07-05 NOTE — Telephone Encounter (Signed)
Never mind [name].  I see that it was discuss vaginal dryness.  All is ok then and we don't need to do anything.

## 2012-11-01 ENCOUNTER — Ambulatory Visit: Payer: Self-pay | Admitting: Obstetrics and Gynecology

## 2012-11-01 ENCOUNTER — Telehealth: Payer: Self-pay | Admitting: Obstetrics and Gynecology

## 2012-11-01 NOTE — Telephone Encounter (Signed)
Patient cancelled AEX appointment today due to "coming down with something and not feeling well." Rescheduled for 11/15/12 with Dr. Tresa Res.

## 2012-11-15 ENCOUNTER — Encounter: Payer: Self-pay | Admitting: Obstetrics and Gynecology

## 2012-11-15 ENCOUNTER — Ambulatory Visit (INDEPENDENT_AMBULATORY_CARE_PROVIDER_SITE_OTHER): Payer: 59 | Admitting: Obstetrics and Gynecology

## 2012-11-15 VITALS — BP 118/60 | HR 72 | Resp 12 | Ht 62.0 in | Wt 147.2 lb

## 2012-11-15 DIAGNOSIS — Z01419 Encounter for gynecological examination (general) (routine) without abnormal findings: Secondary | ICD-10-CM

## 2012-11-15 DIAGNOSIS — Z0000 Encounter for general adult medical examination without abnormal findings: Secondary | ICD-10-CM

## 2012-11-15 LAB — POCT URINALYSIS DIPSTICK
Spec Grav, UA: 1.02
pH, UA: 6

## 2012-11-15 MED ORDER — ESTRADIOL 0.1 MG/GM VA CREA: TOPICAL_CREAM | VAGINAL | Status: DC

## 2012-11-15 NOTE — Progress Notes (Addendum)
62 y.o.   Married    Caucasian   female   G1P0010   here for annual exam.  Having work up for chronic nausea.  No positive dx yet.  Not sexually active in 2 years.  Tried to have sex, even using olive oil, and it is very painful.  Wants to be able to have sex.  Discussed option for vaginal estrogen and also po osphena, and pt wants to use the cream. Pt very stressed at home and is tearful today.     No LMP recorded. Patient is postmenopausal.          Sexually active: no  The current method of family planning is vasectomy.    Exercising: n/a Last mammogram:  10/2012 Last pap smear: 11/01/2011  Negative   History of abnormal pap: no Smoking: no Alcohol: no Last colonoscopy: 10/2005 Last Bone Density:  2007 Last tetanus shot: 2010 Last cholesterol check: 03/2012  Hgb: PCP maintains all labs               Urine:  negative   Family History  Problem Relation Age of Onset  . Alzheimer's disease Mother   . Hypertension Mother   . Stroke Mother   . Migraines Mother   . Hypertension Father     Patient Active Problem List   Diagnosis Date Noted  . Umbilical hernia 08/09/2011    Past Medical History  Diagnosis Date  . GERD (gastroesophageal reflux disease)   . Neuromuscular disorder     arachnoiditis  . Arachnoiditis   . Environmental allergies   . Elevated liver enzymes   . Depression   . Migraines   . H/O being hospitalized 03/2012    x1 week for nausea  . Nausea     chronic nausea     Past Surgical History  Procedure Laterality Date  . Abdominoplasty  07/11/09  . Cholecystectomy  2010  . Eus N/A 05/05/2012    Procedure: UPPER ENDOSCOPIC ULTRASOUND (EUS) LINEAR;  Surgeon: Theda Belfast, MD;  Location: [facility] ENDOSCOPY;  Service: Endoscopy;  Laterality: N/A;    Allergies: Compazine and Morphine and related  Current Outpatient Prescriptions  Medication Sig Dispense Refill  . CARAFATE 1 GM/10ML suspension 100 mLs.      . clonazePAM (KLONOPIN) 0.5 MG tablet Take 0.5 mg by mouth  2 (two) times daily.      Marland Kitchen Dexlansoprazole (DEXILANT PO) Take 40 mg by mouth daily.      Marland Kitchen dronabinol (MARINOL) 10 MG capsule Take 60 mg by mouth.      Marland Kitchen FLUoxetine (PROZAC) 20 MG capsule 20 mg.      . gabapentin (NEURONTIN) 600 MG tablet Take 600 mg by mouth. x5      . oxyCODONE (ROXICODONE) 15 MG immediate release tablet Take 15 mg by mouth every 4 (four) hours as needed.      . promethazine (PHENERGAN) 25 MG tablet 25 mg.       No current facility-administered medications for this visit.    ROS: Pertinent items are noted in HPI.  Social Hx: married, 2 adopted step daughters, homemaker   Exam:    BP 118/60  Pulse 72  Resp 12  Ht 5\' 2"  (1.575 m)  Wt 147 lb 3.2 oz (66.769 kg)  BMI 26.92 kg/m2Ht stable and wt down 21 pounds from last year   Wt Readings from Last 3 Encounters:  11/15/12 147 lb 3.2 oz (66.769 kg)  05/05/12 140 lb (63.504 kg)  05/05/12  140 lb (63.504 kg)     Ht Readings from Last 3 Encounters:  11/15/12 5\' 2"  (1.575 m)  05/05/12 5\' 2"  (1.575 m)  05/05/12 5\' 2"  (1.575 m)    General appearance: alert, cooperative and appears stated age Head: Normocephalic, without obvious abnormality, atraumatic Neck: no adenopathy, supple, symmetrical, trachea midline and thyroid not enlarged, symmetric, no tenderness/mass/nodules Lungs: clear to auscultation bilaterally Breasts: Inspection negative, No nipple retraction or dimpling, No nipple discharge or bleeding, No axillary or supraclavicular adenopathy, Normal to palpation without dominant masses Heart: regular rate and rhythm Abdomen: soft, non-tender; bowel sounds normal; no masses,  no organomegaly Extremities: extremities normal, atraumatic, no cyanosis or edema Skin: Skin color, texture, turgor normal. No rashes or lesions Lymph nodes: Cervical, supraclavicular, and axillary nodes normal. No abnormal inguinal nodes palpated Neurologic: Grossly normal   Pelvic: External genitalia:  no lesions               Urethra:  normal appearing urethra with no masses, tenderness or lesions              Bartholins and Skenes: normal                 Vagina: normal appearing vagina with normal color and discharge, no lesions              Cervix: normal appearance              Pap taken: no        Bimanual Exam:  Uterus:  uterus is normal size, shape, consistency and nontender                                      Adnexa: normal adnexa in size, nontender and no masses                                      Rectovaginal: Confirms                                      Anus:  normal sphincter tone, no lesions  A: normal menopausal exam, no HRT     H/o simple hyperplasia w/o atypia in 2004     Depression/anxiety; GERD; migraines     H/o accessory ribs     P:     mammogram counseled on breast self exam, mammography screening, adequate intake of calcium and vitamin D, diet and exercise return annually or prn   Pt is seeing a psychiatrist and feels very upset by her family situation.  We discussed the effects of anxiety on the GI tract.  Pt is worried that her husband will leave her beacause she can't have sex.  Will start estrace vaginal cream, and pt instructed.     An After Visit Summary was printed and given to the patient.   11/18/2012 Pt decided she would prefer the pill for vag dryness to the cream.  Rx for osphena 1 po qd sent with RF for 1 year.  CPRomine MD

## 2012-11-15 NOTE — Patient Instructions (Signed)

## 2012-11-17 ENCOUNTER — Telehealth: Payer: Self-pay | Admitting: Obstetrics and Gynecology

## 2012-11-17 NOTE — Telephone Encounter (Signed)
Spoke to patient regarding Estrace Cream. Patient would rather have pill medication. States the cream is too much bother to fool with in keeping applicator clean. States she did use x  2 nights. Pharmacy [facility]. Please advise.

## 2012-11-17 NOTE — Telephone Encounter (Signed)
Patient was given Estrace Cream to use on Wednesday. She has since decided that she would rather have something in pill form.

## 2012-11-18 MED ORDER — OSPEMIFENE 60 MG PO TABS: 1.0000 | ORAL_TABLET | Freq: Every day | ORAL | Status: DC

## 2012-11-18 NOTE — Telephone Encounter (Signed)
I sent the rx to her pharmacy for Osphena.

## 2012-11-18 NOTE — Addendum Note (Signed)
Addended by: Alison Murray on: 11/18/2012 12:23 PM   Modules accepted: Orders

## 2012-11-20 NOTE — Telephone Encounter (Signed)
Left a message on answering machine telling patient new Rx at [facility]

## 2012-12-27 DIAGNOSIS — R110 Nausea: Secondary | ICD-10-CM | POA: Diagnosis not present

## 2013-02-01 DIAGNOSIS — R141 Gas pain: Secondary | ICD-10-CM | POA: Diagnosis not present

## 2013-02-01 DIAGNOSIS — K5900 Constipation, unspecified: Secondary | ICD-10-CM | POA: Diagnosis not present

## 2013-02-01 DIAGNOSIS — R110 Nausea: Secondary | ICD-10-CM | POA: Diagnosis not present

## 2013-02-27 DIAGNOSIS — R112 Nausea with vomiting, unspecified: Secondary | ICD-10-CM | POA: Diagnosis not present

## 2013-02-27 DIAGNOSIS — IMO0002 Reserved for concepts with insufficient information to code with codable children: Secondary | ICD-10-CM | POA: Diagnosis not present

## 2013-02-27 DIAGNOSIS — G039 Meningitis, unspecified: Secondary | ICD-10-CM | POA: Diagnosis not present

## 2013-04-17 DIAGNOSIS — IMO0001 Reserved for inherently not codable concepts without codable children: Secondary | ICD-10-CM | POA: Diagnosis not present

## 2013-04-17 DIAGNOSIS — J069 Acute upper respiratory infection, unspecified: Secondary | ICD-10-CM | POA: Diagnosis not present

## 2013-04-19 DIAGNOSIS — R112 Nausea with vomiting, unspecified: Secondary | ICD-10-CM | POA: Diagnosis not present

## 2013-04-19 DIAGNOSIS — IMO0002 Reserved for concepts with insufficient information to code with codable children: Secondary | ICD-10-CM | POA: Diagnosis not present

## 2013-04-19 DIAGNOSIS — G039 Meningitis, unspecified: Secondary | ICD-10-CM | POA: Diagnosis not present

## 2013-06-14 DIAGNOSIS — R5381 Other malaise: Secondary | ICD-10-CM | POA: Diagnosis not present

## 2013-06-14 DIAGNOSIS — M359 Systemic involvement of connective tissue, unspecified: Secondary | ICD-10-CM | POA: Diagnosis not present

## 2013-06-14 DIAGNOSIS — E039 Hypothyroidism, unspecified: Secondary | ICD-10-CM | POA: Diagnosis not present

## 2013-06-14 DIAGNOSIS — D518 Other vitamin B12 deficiency anemias: Secondary | ICD-10-CM | POA: Diagnosis not present

## 2013-06-14 DIAGNOSIS — R7989 Other specified abnormal findings of blood chemistry: Secondary | ICD-10-CM | POA: Diagnosis not present

## 2013-06-14 DIAGNOSIS — R6889 Other general symptoms and signs: Secondary | ICD-10-CM | POA: Diagnosis not present

## 2013-06-14 DIAGNOSIS — E2839 Other primary ovarian failure: Secondary | ICD-10-CM | POA: Diagnosis not present

## 2013-06-14 DIAGNOSIS — R5383 Other fatigue: Secondary | ICD-10-CM | POA: Diagnosis not present

## 2013-06-19 DIAGNOSIS — M47817 Spondylosis without myelopathy or radiculopathy, lumbosacral region: Secondary | ICD-10-CM | POA: Diagnosis not present

## 2013-06-19 DIAGNOSIS — G039 Meningitis, unspecified: Secondary | ICD-10-CM | POA: Diagnosis not present

## 2013-07-16 DIAGNOSIS — E781 Pure hyperglyceridemia: Secondary | ICD-10-CM | POA: Diagnosis not present

## 2013-07-30 DIAGNOSIS — K589 Irritable bowel syndrome without diarrhea: Secondary | ICD-10-CM | POA: Diagnosis not present

## 2013-09-26 DIAGNOSIS — G039 Meningitis, unspecified: Secondary | ICD-10-CM | POA: Diagnosis not present

## 2013-09-26 DIAGNOSIS — M47817 Spondylosis without myelopathy or radiculopathy, lumbosacral region: Secondary | ICD-10-CM | POA: Diagnosis not present

## 2013-11-16 ENCOUNTER — Ambulatory Visit: Payer: 59 | Admitting: Gynecology

## 2013-11-27 DIAGNOSIS — Z0000 Encounter for general adult medical examination without abnormal findings: Secondary | ICD-10-CM | POA: Diagnosis not present

## 2013-11-27 DIAGNOSIS — K219 Gastro-esophageal reflux disease without esophagitis: Secondary | ICD-10-CM | POA: Diagnosis not present

## 2013-11-27 DIAGNOSIS — E669 Obesity, unspecified: Secondary | ICD-10-CM | POA: Diagnosis not present

## 2013-11-27 DIAGNOSIS — E781 Pure hyperglyceridemia: Secondary | ICD-10-CM | POA: Diagnosis not present

## 2013-11-27 DIAGNOSIS — F411 Generalized anxiety disorder: Secondary | ICD-10-CM | POA: Diagnosis not present

## 2013-11-27 DIAGNOSIS — J309 Allergic rhinitis, unspecified: Secondary | ICD-10-CM | POA: Diagnosis not present

## 2013-11-27 DIAGNOSIS — R110 Nausea: Secondary | ICD-10-CM | POA: Diagnosis not present

## 2013-11-27 DIAGNOSIS — F339 Major depressive disorder, recurrent, unspecified: Secondary | ICD-10-CM | POA: Diagnosis not present

## 2013-11-28 DIAGNOSIS — G039 Meningitis, unspecified: Secondary | ICD-10-CM | POA: Diagnosis not present

## 2013-12-04 ENCOUNTER — Encounter: Payer: Self-pay | Admitting: Gynecology

## 2013-12-04 ENCOUNTER — Ambulatory Visit (INDEPENDENT_AMBULATORY_CARE_PROVIDER_SITE_OTHER): Payer: 59 | Admitting: Gynecology

## 2013-12-04 VITALS — BP 140/80 | HR 78 | Resp 16 | Ht 61.0 in | Wt 144.0 lb

## 2013-12-04 DIAGNOSIS — G039 Meningitis, unspecified: Secondary | ICD-10-CM

## 2013-12-04 DIAGNOSIS — N63 Unspecified lump in unspecified breast: Secondary | ICD-10-CM

## 2013-12-04 DIAGNOSIS — Z01419 Encounter for gynecological examination (general) (routine) without abnormal findings: Secondary | ICD-10-CM

## 2013-12-04 DIAGNOSIS — K5900 Constipation, unspecified: Secondary | ICD-10-CM

## 2013-12-04 DIAGNOSIS — IMO0002 Reserved for concepts with insufficient information to code with codable children: Secondary | ICD-10-CM | POA: Diagnosis not present

## 2013-12-04 DIAGNOSIS — N6310 Unspecified lump in the right breast, unspecified quadrant: Secondary | ICD-10-CM

## 2013-12-04 NOTE — Patient Instructions (Signed)
miralax-poly-ethylene glycol powder glycerine suppository

## 2013-12-04 NOTE — Progress Notes (Addendum)
Patient scheduled for Diagnostic Mammogram of  R Breast with R Breast Ultrasound at [facility] for 12/11/13 at 1300. Patient agreeable. Order faxed to [facility].

## 2013-12-04 NOTE — Progress Notes (Signed)
63 y.o. Married Caucasian female   G1P0010 here for annual exam. Pt reports menses are absent due to menopause. She does not report hot flashes, once in awhile have night sweats, does have vaginal dryness.  She is not using lubricants,.  She does not report post-menopasual bleeding.  Pt reports no vaginal penetration for 3y as a result of pain.  Pt never used osphena due to GI issues.    Patient's last menstrual period was 12/28/2011.          Sexually active: No.  The current method of family planning is post menopausal status and Husband has Vasectomy.    Exercising: No.  The patient does not participate in regular exercise at present. Last pap: 11/01/11 NEG HR HPV Abnormal PAP: no Mammogram: 11/08/13 Bi-Rads 1 BSE: yes  Colonoscopy: 11/19/2005-2 polyps f/u in 10 years DEXA: 2007 Alcohol: no Tobacco: no  Labs:  Ward Givens, MD   Health Maintenance  Topic Date Due  . Tetanus/tdap  12/27/1969  . Zostavax  12/28/2010  . Influenza Vaccine  10/27/2013  . Pap Smear  11/01/2014  . Mammogram  11/09/2015  . Colonoscopy  11/20/2015    Family History  Problem Relation Age of Onset  . Alzheimer's disease Mother   . Hypertension Mother   . Stroke Mother   . Migraines Mother   . Hypertension Father     Patient Active Problem List   Diagnosis Date Noted  . Umbilical hernia 43/15/4008    Past Medical History  Diagnosis Date  . GERD (gastroesophageal reflux disease)   . Neuromuscular disorder     arachnoiditis  . Arachnoiditis   . Environmental allergies   . Elevated liver enzymes   . Depression   . Migraines   . H/O being hospitalized 03/2012    x1 week for nausea  . Nausea     chronic nausea     Past Surgical History  Procedure Laterality Date  . Abdominoplasty  07/11/09  . Cholecystectomy  2010  . Eus N/A 05/05/2012    Procedure: UPPER ENDOSCOPIC ULTRASOUND (EUS) LINEAR;  Surgeon: Beryle Beams, MD;  Location: [facility];  Service: Endoscopy;  Laterality: N/A;     Allergies: Compazine and Morphine and related  Current Outpatient Prescriptions  Medication Sig Dispense Refill  . CARAFATE 1 GM/10ML suspension 100 mLs.      . clonazePAM (KLONOPIN) 0.5 MG tablet Take 0.5 mg by mouth 2 (two) times daily.      Marland Kitchen Dexlansoprazole (DEXILANT PO) Take 40 mg by mouth daily.      Marland Kitchen dronabinol (MARINOL) 10 MG capsule Take 60 mg by mouth.      . estradiol (ESTRACE) 0.1 MG/GM vaginal cream 1/2 g pv qhs x 2 wks, then 1/2 g pv 2-3 times q wk  42.5 g  3  . FLUoxetine (PROZAC) 20 MG capsule 20 mg.      . gabapentin (NEURONTIN) 600 MG tablet Take 600 mg by mouth. x5      . Ospemifene (OSPHENA) 60 MG TABS Take 1 tablet by mouth daily.  30 tablet  12  . oxyCODONE (ROXICODONE) 15 MG immediate release tablet Take 15 mg by mouth every 4 (four) hours as needed.      . promethazine (PHENERGAN) 25 MG tablet 25 mg.       No current facility-administered medications for this visit.    ROS: Pertinent items are noted in HPI.  Exam:    LMP 12/28/2011 Weight change: @WEIGHTCHANGE @ Last 3 height  recordings:  Ht Readings from Last 3 Encounters:  11/15/12 5\' 2"  (1.575 m)  05/05/12 5\' 2"  (1.575 m)  05/05/12 5\' 2"  (1.575 m)   General appearance: alert, cooperative and appears stated age Head: Normocephalic, without obvious abnormality, atraumatic Neck: no adenopathy, no carotid bruit, no JVD, supple, symmetrical, trachea midline and thyroid not enlarged, symmetric, no tenderness/mass/nodules Lungs: clear to auscultation bilaterally Breasts: Inspection negative, No nipple retraction or dimpling, No nipple discharge or bleeding, No axillary or supraclavicular adenopathy, positive findings: tenderness on right at 8 o'clock, fibrocystic like changes, felt best in upright position, left negative Heart: regular rate and rhythm, S1, S2 normal, no murmur, click, rub or gallop Abdomen: soft, non-tender; bowel sounds normal; no masses,  no organomegaly Extremities: extremities normal,  atraumatic, no cyanosis or edema Skin: Skin color, texture, turgor normal. No rashes or lesions Lymph nodes: Cervical, supraclavicular, and axillary nodes normal. no inguinal nodes palpated Neurologic: Grossly normal   Pelvic: External genitalia:  no lesions              Urethra: normal appearing urethra with no masses, tenderness or lesions              Bartholins and Skenes: Bartholin's, Urethra, Skene's normal                 Vagina: atrophic, rusty discharge, large amount of hard stool in rectum limiting exam, affecting depth, tenderness at edge of stool on left              Cervix: normal appearance              Pap taken: No.        Bimanual Exam:  Uterus:  uterus is normal size, shape, consistency and nontender                                      Adnexa:    no masses                                      Rectovaginal: Deferred                                      Anus:  defer exam       1. Routine gynecological examination  counseled on breast self exam, adequate intake of calcium and vitamin D, diet and exercise return annually or prn Discussed PAP guideline changes, importance of weight bearing exercises, calcium, vit D and balanced diet.  2. Dyspareunia Evaluation limited by stool in rectum Pt with atrophic changes, normal length and aperture Will rto after chronic constipation resolved so RVE can be done to assess left sided tenderness  3. Unspecified constipation Many GI issues, on zofran for nausea, recommend using miralax to soften stool and help with evacuation so pelvic exam can be done, may contribute to dyspareunia  4. Mass of breast, right Right dx mammogram and u/s  An After Visit Summary was printed and given to the patient.

## 2013-12-14 ENCOUNTER — Telehealth: Payer: Self-pay | Admitting: Gynecology

## 2013-12-14 NOTE — Telephone Encounter (Signed)
Advised patient per Dr. Orpah Greek note did want patient to return and have vaginal exam. Patient states she used miralax and suppository and is feeling much better. Patient requests mid-afternoon appointment. Appointment scheduled for 01/04/14 at 1200. Patient agreeable. Routing to provider for final review. Patient agreeable to disposition. Will close encounter

## 2013-12-14 NOTE — Telephone Encounter (Signed)
Pt is not sure if she need an appointment to come back in to see Dr Charlies Constable.

## 2013-12-17 DIAGNOSIS — N644 Mastodynia: Secondary | ICD-10-CM | POA: Diagnosis not present

## 2013-12-25 ENCOUNTER — Ambulatory Visit (INDEPENDENT_AMBULATORY_CARE_PROVIDER_SITE_OTHER): Payer: 59 | Admitting: Family

## 2013-12-25 ENCOUNTER — Encounter: Payer: Self-pay | Admitting: Family

## 2013-12-25 VITALS — BP 135/75 | HR 65 | Temp 98.5°F | Resp 16 | Ht 61.75 in | Wt 146.4 lb

## 2013-12-25 DIAGNOSIS — Z23 Encounter for immunization: Secondary | ICD-10-CM

## 2013-12-25 DIAGNOSIS — F341 Dysthymic disorder: Secondary | ICD-10-CM

## 2013-12-25 DIAGNOSIS — F419 Anxiety disorder, unspecified: Secondary | ICD-10-CM

## 2013-12-25 DIAGNOSIS — F32A Depression, unspecified: Secondary | ICD-10-CM | POA: Insufficient documentation

## 2013-12-25 DIAGNOSIS — G039 Meningitis, unspecified: Secondary | ICD-10-CM | POA: Diagnosis not present

## 2013-12-25 DIAGNOSIS — R110 Nausea: Secondary | ICD-10-CM

## 2013-12-25 DIAGNOSIS — F329 Major depressive disorder, single episode, unspecified: Secondary | ICD-10-CM | POA: Insufficient documentation

## 2013-12-25 MED ORDER — ESCITALOPRAM OXALATE 10 MG PO TABS: 10.0000 mg | ORAL_TABLET | Freq: Every day | ORAL | Status: DC

## 2013-12-25 MED ORDER — OMEPRAZOLE 40 MG PO CPDR: 40.0000 mg | DELAYED_RELEASE_CAPSULE | Freq: Every day | ORAL | Status: DC

## 2013-12-25 NOTE — Assessment & Plan Note (Signed)
This is managed by Dr. Marijean Bravo- neurology. She continues to have chronic low back pain.

## 2013-12-25 NOTE — Assessment & Plan Note (Addendum)
I think most likely cause for her chronic nausea is her duragesic patch. IBS may also be a contributing factor given her irregular bowel patterns. I have advised her to speak with her neurologist about changing her duragesic patch to another med.  We also discussed that SSRI's can cause nausea, however her nausea has been present x 2 years and she started paxil 8 months ago without worsening of her symptoms. Will add empiric PPI to see if this helps. Continue miralax to prevent constipation.

## 2013-12-25 NOTE — Progress Notes (Signed)
Pre visit review using our clinic review tool, if applicable. No additional management support is needed unless otherwise documented below in the visit note. 

## 2013-12-25 NOTE — Patient Instructions (Signed)
Stop Paxil, start lexapro.   Start omeprazole Follow up in 1 month.

## 2013-12-25 NOTE — Assessment & Plan Note (Signed)
Uncontrolled. Will try switching paxil to lexapro to see if this helps.  If no significant improvement, consider addition of SNRI.

## 2013-12-25 NOTE — Progress Notes (Signed)
Subjective:    Patient ID: [name], female    DOB: Mar 25, 1951, 63 y.o.   MRN: [id number]  HPI  Pt new to establish care. He primary concern today is chronic nausea x 2 years. Has seen multiple doctors and cause has not been determined.    Doesn't remember last tetanus, would like flu and pneumonia vaccines. Last colonoscopy 2010. Has upcoming pap smear 01/04/14, last mammogram 12/17/13.  Reports that initially she "could barely eat."  Has seen Dr. Zandra Abts.  Was referred back to Dr. Marijean Bravo her neurologist.  Reports several endoscopies in the last 2 years.   Chart reviewed- pt had choledocholithiasis 2/14 and underwent EUS and ultimately underwent cholecystectomy. She has known umbilical hernia which is followed by Dr. Harlow Asa. In addition to seeing Dr. Lacinda Axon at [facility] for [facility], she has also seen Dr. Benson Norway and Dr. Collene Mares in the past.    Reports that she had a stent placed endoscopically following her cholecystectomy which was subsequently removed.  Dr. Benson Norway also did an endoscopy Reports last cholonoscopy 7 years ago.   Occasional GERD.  She has seen Dr. Collene Mares who had her on dexilant. However she has not been on Dexilant or a PPI in a long time. Reports that she also had a normal gastric emptying study.  .    Reports that her BM's are "feast or famine."  She takes miralax every day  She uses odansetron prn for nausea. Reports stress worsens the nausea.  Has occasional associated vomiting.  Stopped working due to nausea.    Weighed 200 pounds 2 years ago. Weight is now 146.    Anxiety/Depression- reports that she has been on paxil for 8 months.  Continues to have anxiety, irritability, anorexia.   Uses klonazepam, daily tearfulness. She uses zquil to sleep or clonazepam. Otherwise she wakes up 2-3 times a night.  She is working with a Transport planner.    Arachnoiditis- Fentanyl patch- x 7 years. Initially was on 12.37mcg. Now on 50 mcg. This is prescribed by Dr. Marijean Bravo her neurologist for her chronic  low back pain.   Review of Systems See HPI  Past Medical History  Diagnosis Date  . GERD (gastroesophageal reflux disease)   . Arachnoiditis   . Environmental allergies   . Elevated liver enzymes   . Depression   . Migraine with typical aura     resolved years ago  . H/O being hospitalized 03/2012    x1 week for nausea  . Nausea     chronic nausea   . History of chicken pox   . Allergy     seasonal    History   Social History  . Marital Status: Married    Spouse Name: N/A    Number of Children: N/A  . Years of Education: N/A   Occupational History  . Not on file.   Social History Main Topics  . Smoking status: Former Smoker    Quit date: 08/09/1986  . Smokeless tobacco: Never Used  . Alcohol Use: No  . Drug Use: No  . Sexual Activity: No     Comment: husband had vasectomy.    Other Topics Concern  . Not on file   Social History Narrative   2 step daughters- 66 and 49   She worked as an Web designer.   Enjoys puzzles, counted cross stitch.   Completed 1 year of college   4 cats    Past Surgical History  Procedure Laterality Date  .  Abdominoplasty  07/11/09  . Eus N/A 05/05/2012    Procedure: UPPER ENDOSCOPIC ULTRASOUND (EUS) LINEAR;  Surgeon: Beryle Beams, MD;  Location: [facility];  Service: Endoscopy;  Laterality: N/A;  . Laparoscopic cholecystectomy  2010  . Nasal sinus surgery      "Has had 2-3 surgeries over the years"    Family History  Problem Relation Age of Onset  . Alzheimer's disease Mother   . Hypertension Mother   . Migraines Mother   . Hypertension Father   . Other Father     ?carotid artery aneurysm    Allergies  Allergen Reactions  . Compazine [Prochlorperazine]     hyperactivity  . Morphine And Related Hives and Itching    Current Outpatient Prescriptions on File Prior to Visit  Medication Sig Dispense Refill  . clonazePAM (KLONOPIN) 0.5 MG tablet Take 0.5 mg by mouth 2 (two) times daily.      . fentaNYL  (DURAGESIC - DOSED MCG/HR) 50 MCG/HR See admin instructions. Every 3 days      . gabapentin (NEURONTIN) 600 MG tablet Take 600 mg by mouth. x5      . ondansetron (ZOFRAN) 8 MG tablet daily.       No current facility-administered medications on file prior to visit.    BP 135/75  Pulse 65  Temp(Src) 98.5 F (36.9 C) (Oral)  Resp 16  Ht 5' 1.75" (1.568 m)  Wt 146 lb 6.4 oz (66.407 kg)  BMI 27.01 kg/m2  SpO2 95%  LMP 03/29/1993       Objective:   Physical Exam  Constitutional: She is oriented to person, place, and time. She appears well-developed and well-nourished. No distress.  HENT:  Head: Normocephalic and atraumatic.  Cardiovascular: Normal rate and regular rhythm.   No murmur heard. Pulmonary/Chest: Effort normal and breath sounds normal. No respiratory distress. She has no wheezes. She has no rales. She exhibits no tenderness.  Abdominal: Soft. Bowel sounds are normal. She exhibits no distension and no mass. There is no tenderness. There is no rebound and no guarding.  Neurological: She is alert and oriented to person, place, and time.  Psychiatric: Her behavior is normal. Judgment and thought content normal.  Mildly anxious, flat affect, intermittently tearful          Assessment & Plan:  55 minutes spent with pt today.  >50% of this time was spent counseling patient on anxiety, depression, chronic nausea.

## 2014-01-04 ENCOUNTER — Ambulatory Visit: Payer: Medicare Other | Admitting: Gynecology

## 2014-01-04 ENCOUNTER — Telehealth: Payer: Self-pay | Admitting: Gynecology

## 2014-01-04 ENCOUNTER — Telehealth: Payer: Self-pay | Admitting: Family

## 2014-01-04 NOTE — Telephone Encounter (Signed)
Caller name: Kashia Relation to pt: Call back [place]:  Reason for call:  Pt wonders if her vagus nerve is causing her nausea.  She had her bladder removed, and she thinks this might be her issues.    Pt would like to possibly if you think she should.

## 2014-01-04 NOTE — Telephone Encounter (Signed)
FYI--patient called and cx for today due to Follow up/RVE/tf /pt rs due to up sick all night/[place]

## 2014-01-06 NOTE — Telephone Encounter (Signed)
That would be unlikely, I think that the duragesic patch is most likely cause.  I would recommend that she contact her neurologist- Dr. Marijean Bravo to discuss alternative treatment besides the fentanyl patch.

## 2014-01-07 NOTE — Telephone Encounter (Signed)
Notified pt and she states she has contacted Dr Barbera Setters office. He is out of the office until tomorrow and she is awaiting their return call.

## 2014-01-14 ENCOUNTER — Other Ambulatory Visit: Payer: Self-pay | Admitting: Family

## 2014-01-14 NOTE — Telephone Encounter (Signed)
Caller name: Aeon  Call back number:[phone]   Reason for call: pt states that she has a hx of nausea but her new Rx escitalopram (LEXAPRO) 10 MG tablet is making her more nauseous.  Advise.

## 2014-01-14 NOTE — Telephone Encounter (Signed)
[name] called to see if someone was going to call her back, I told her that you usually call back at the end of day after seeing patients.

## 2014-01-14 NOTE — Telephone Encounter (Signed)
Spoke with pt, she states she has been taking lexapro every morning.  Nausea is so bad that she states she can hardly get out of bed and feels sick all day.  Please advise.

## 2014-01-15 MED ORDER — VENLAFAXINE HCL ER 37.5 MG PO CP24: ORAL_CAPSULE | ORAL | Status: DC

## 2014-01-15 NOTE — Telephone Encounter (Signed)
Spoke with pt, she is agreeable to proceed with Effexor XR. Rx sent and f/u scheduled for 02/13/14 at 1:15pm. Cancelled 01/24/14 appt.

## 2014-01-15 NOTE — Telephone Encounter (Signed)
If she would like to come off of lexapro, then I would recommend a different med for anxiety/depression such as effexor.  If she would like to start, I will send to her pharmacy and she will need to follow up in 1 month (pended below).

## 2014-01-16 ENCOUNTER — Ambulatory Visit: Payer: Medicare Other | Admitting: Gynecology

## 2014-01-16 NOTE — Telephone Encounter (Signed)
Pt rescheduled her appointment for today because she is sick.

## 2014-01-17 ENCOUNTER — Telehealth: Payer: Self-pay | Admitting: Family Medicine

## 2014-01-17 ENCOUNTER — Other Ambulatory Visit: Payer: Self-pay | Admitting: Family

## 2014-01-17 DIAGNOSIS — F329 Major depressive disorder, single episode, unspecified: Secondary | ICD-10-CM

## 2014-01-17 DIAGNOSIS — F32A Depression, unspecified: Secondary | ICD-10-CM

## 2014-01-17 DIAGNOSIS — F419 Anxiety disorder, unspecified: Principal | ICD-10-CM

## 2014-01-17 MED ORDER — PAROXETINE HCL 30 MG PO TABS: 30.0000 mg | ORAL_TABLET | Freq: Every day | ORAL | Status: DC

## 2014-01-17 NOTE — Telephone Encounter (Signed)
Received call from patient's psychiatrist and therapist Dr. Elane Fritz at [place]. She reports the patient is seeing her and not doing well and she would like to put the patient back on Paxil 30 mg. She will discontinue the patient's Venlafaxine. She reports patient tried Venlafaxine in past without good results.  Informed Dr Silvio Pate that patient had only mentioned a therapist and that we will make note of this in her chart for future reference.

## 2014-01-18 ENCOUNTER — Ambulatory Visit (INDEPENDENT_AMBULATORY_CARE_PROVIDER_SITE_OTHER): Payer: 59 | Admitting: Gynecology

## 2014-01-18 ENCOUNTER — Encounter: Payer: Self-pay | Admitting: Gynecology

## 2014-01-18 VITALS — BP 128/74 | HR 68 | Resp 16 | Ht 61.0 in | Wt 146.0 lb

## 2014-01-18 DIAGNOSIS — R1032 Left lower quadrant pain: Secondary | ICD-10-CM

## 2014-01-18 DIAGNOSIS — N941 Dyspareunia: Secondary | ICD-10-CM | POA: Diagnosis not present

## 2014-01-18 DIAGNOSIS — IMO0002 Reserved for concepts with insufficient information to code with codable children: Secondary | ICD-10-CM

## 2014-01-18 MED ORDER — LIDOCAINE HCL 2 % EX GEL: 1.0000 "application " | CUTANEOUS | Status: DC | PRN

## 2014-01-18 NOTE — Patient Instructions (Signed)
Lidocaine jelly as directed Cocoanut oil as lubricant Glycerine suppository before sex

## 2014-01-18 NOTE — Progress Notes (Signed)
Subjective:     Patient ID: [name], female   DOB: 10-21-1950, 63 y.o.   MRN: [id number]  HPI Comments: Pt here for follow up.  She had been using the miralax daily and feels like she no longer needs to strain for bowel movements.  Pt states that she still has chronic nausea and is using zofran with good results, evaluation has been normal.  Pt states that she still has some LLQ discomfort that could not be illicited at annual    Review of Systems Per hpi    Objective:   Physical Exam  Nursing note and vitals reviewed. Constitutional: She is oriented to person, place, and time. She appears well-developed and well-nourished.  Genitourinary:   Pelvic: External genitalia:  PM changes              Urethra:  normal appearing urethra with no masses, tenderness or lesions              Bartholins and Skenes: normal                 Vagina: atrophic changes, moderate firm stool palpated through rectum              Cervix: normal appearance                     Bimanual Exam:  Uterus:  uterus is normal size, shape, consistency and nontender                                       Adnexa: normal adnexa in size, nontender and no masses                                      Rectovaginal: left sided firmness, posterior uterus c/w described pain   Neurological: She is alert and oriented to person, place, and time.       Assessment:     Atrophic vaginitis Dyspareunia llq fullness Chronic constipation     Plan:     Pt want to try lidocaine jelly to help break anticipation of pain Instructed on use PUS-reviewed u/s 2003, small posterior fibroid Recommend trying to keep rectum empty to reset sensation of fullness-pt agrees Pt is on several medications that can cause constipation

## 2014-01-21 ENCOUNTER — Telehealth: Payer: Self-pay | Admitting: Family

## 2014-01-21 DIAGNOSIS — G039 Meningitis, unspecified: Secondary | ICD-10-CM

## 2014-01-21 NOTE — Telephone Encounter (Signed)
Pt cancelled her appt on 02/13/14, states it was a follow-up on her meds that [facility] had changed, however her phychiatric dr took her off the lexapro and put her back on paxil. States the phyciatric doctor informed her that she is the only one that can change her meds. Pt states if Lenna Sciara still needs to see her to give her a call

## 2014-01-22 ENCOUNTER — Telehealth: Payer: Self-pay | Admitting: Gynecology

## 2014-01-22 DIAGNOSIS — R1032 Left lower quadrant pain: Secondary | ICD-10-CM

## 2014-01-22 NOTE — Telephone Encounter (Signed)
Noted,  Lets bring her back in 3 months for a complete physical.

## 2014-01-22 NOTE — Telephone Encounter (Signed)
Spoke with patient. Advised that per benefit quote received, she will be responsible for $100.19 when she comes in for PUS. Patient agreeable. Patient states that she is in bed with a headache, but will try to call back tomorrow for scheduling.

## 2014-01-23 DIAGNOSIS — G894 Chronic pain syndrome: Secondary | ICD-10-CM | POA: Diagnosis not present

## 2014-01-23 NOTE — Telephone Encounter (Signed)
[name] can you hold the order in the work que until patient schedules.

## 2014-01-24 ENCOUNTER — Ambulatory Visit: Payer: 59 | Admitting: Family

## 2014-01-24 NOTE — Telephone Encounter (Signed)
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

## 2014-01-25 ENCOUNTER — Other Ambulatory Visit: Payer: Self-pay | Admitting: Family

## 2014-01-25 NOTE — Telephone Encounter (Signed)
Rx request to pharmacy/[facility]S  

## 2014-01-28 ENCOUNTER — Encounter: Payer: Self-pay | Admitting: Gynecology

## 2014-01-29 DIAGNOSIS — M5418 Radiculopathy, sacral and sacrococcygeal region: Secondary | ICD-10-CM | POA: Diagnosis not present

## 2014-01-29 DIAGNOSIS — G039 Meningitis, unspecified: Secondary | ICD-10-CM | POA: Diagnosis not present

## 2014-01-30 NOTE — Telephone Encounter (Signed)
Pt calling to schedule an ultrasound appointment. °

## 2014-01-30 NOTE — Telephone Encounter (Signed)
Spoke with patient. Patient would like to schedule PUS at this time. Advised patient Dr.Lathrop is out of the office on administrative leave for personal reasons and will be relocating practices at the first of the year. Advised we would like to take the best care of her during this time and one of our other providers would be happy to see her and continue her care. Patient is agreeable. Ultrasound scheduled for 11/12 at 1 pm with 1:30 pm consult with Dr.Miller. Patient is agreeable to date and time. Patient verbalized understanding of the [facility] appointment cancellation policy. Advised will need to cancel or reschedule within 72 business hours of appointment (3 business days) or will have $100.00 late cancellation fee placed to account. $150.00 for Sonohysterogram.   Routing to provider for final review. Patient agreeable to disposition. Will close encounter

## 2014-01-31 ENCOUNTER — Telehealth: Payer: Self-pay | Admitting: *Deleted

## 2014-01-31 NOTE — Telephone Encounter (Signed)
Received medical records from [facility] [employer] [facility]. Records forwarded to [facility] JG//CMA

## 2014-02-05 NOTE — Addendum Note (Signed)
Addended by: Michele Mcalpine on: 02/05/2014 02:23 PM   Modules accepted: Orders

## 2014-02-05 NOTE — Telephone Encounter (Signed)
Patient has cancelled Pelvic ultrasound  appointment due to back pain. Reordered Pelvic ultrasound under provider Dr. Sabra Heck.

## 2014-02-07 ENCOUNTER — Other Ambulatory Visit: Payer: Medicare Other | Admitting: Obstetrics & Gynecology

## 2014-02-07 ENCOUNTER — Other Ambulatory Visit: Payer: Medicare Other

## 2014-02-13 ENCOUNTER — Ambulatory Visit: Payer: 59 | Admitting: Family

## 2014-02-26 DIAGNOSIS — M5418 Radiculopathy, sacral and sacrococcygeal region: Secondary | ICD-10-CM | POA: Diagnosis not present

## 2014-02-26 DIAGNOSIS — G039 Meningitis, unspecified: Secondary | ICD-10-CM | POA: Diagnosis not present

## 2014-02-26 DIAGNOSIS — M47817 Spondylosis without myelopathy or radiculopathy, lumbosacral region: Secondary | ICD-10-CM | POA: Diagnosis not present

## 2014-03-14 ENCOUNTER — Telehealth: Payer: Self-pay | Admitting: Emergency Medicine

## 2014-03-14 NOTE — Telephone Encounter (Signed)
-----   Message from Willow, MD sent at 03/08/2014  1:40 PM EST ----- Regarding: RE: Mammogram hold Ok to remove from mammogram hold.   Josefa Half ----- Message -----    From: Babbette Chafe, RN    Sent: 03/05/2014   2:14 PM      To: Brook E Amundson de Berton Lan, MD Subject: Mammogram hold                                 Dr. Quincy Simmonds,  This is a former patient of Dr. Charlies Constable. She had diagnostic imaging completed at [facility] for Breast Pain. Okay to remove from Mammogram hold? Images scanned into EPIC.

## 2014-04-12 DIAGNOSIS — G894 Chronic pain syndrome: Secondary | ICD-10-CM | POA: Diagnosis not present

## 2014-05-09 ENCOUNTER — Telehealth: Payer: Self-pay | Admitting: Obstetrics & Gynecology

## 2014-05-09 NOTE — Telephone Encounter (Signed)
Call to patient to see if she is interested in rescheduling PUS that she had cancelled due to back pain. Patient states that she does not want to proceed with the PUS. She states that she is not having any issues nor is she feeling any pain.

## 2014-05-10 ENCOUNTER — Encounter: Payer: Self-pay | Admitting: Physician Assistant

## 2014-05-10 ENCOUNTER — Ambulatory Visit (INDEPENDENT_AMBULATORY_CARE_PROVIDER_SITE_OTHER): Payer: 59 | Admitting: Physician Assistant

## 2014-05-10 VITALS — BP 127/83 | HR 74 | Temp 98.4°F | Resp 16 | Ht 61.0 in | Wt 156.1 lb

## 2014-05-10 DIAGNOSIS — J0100 Acute maxillary sinusitis, unspecified: Secondary | ICD-10-CM

## 2014-05-10 MED ORDER — AMOXICILLIN-POT CLAVULANATE 875-125 MG PO TABS: 1.0000 | ORAL_TABLET | Freq: Two times a day (BID) | ORAL | Status: DC

## 2014-05-10 NOTE — Progress Notes (Signed)
  Subjective:     [name] is a 64 y.o. female who presents for evaluation of symptoms of a URI, possible sinusitis. Symptoms include bilateral ear pressure/pain, congestion, nasal congestion, no  fever, non productive cough, post nasal drip, sinus pressure and tooth pain. Onset of symptoms was 7 days ago, and has been gradually worsening since that time. Treatment to date: cough suppressants and decongestants.  The following portions of the patient's history were reviewed and updated as appropriate: allergies, current medications, past family history, past medical history, past social history, past surgical history and problem list.  Review of Systems Pertinent items are noted in HPI.   Objective:    BP 127/83 mmHg  Pulse 74  Temp(Src) 98.4 F (36.9 C) (Oral)  Resp 16  Ht 5\' 1"  (1.549 m)  Wt 156 lb 2 oz (70.818 kg)  BMI 29.51 kg/m2  SpO2 97%  LMP 03/29/1993 General appearance: alert, cooperative, appears stated age and no distress Head: Normocephalic, without obvious abnormality, atraumatic Ears: abnormal TM left ear - erythematous and bulging Nose: mild congestion, turbinates red, swollen Throat: lips, mucosa, and tongue normal; teeth and gums normal Lungs: clear to auscultation bilaterally Heart: regular rate and rhythm, S1, S2 normal, no murmur, click, rub or gallop Lymph nodes: Cervical, supraclavicular, and axillary nodes normal.   Assessment:    sinusitis   Plan:    Discussed the diagnosis and treatment of sinusitis. Suggested symptomatic OTC remedies. Nasal saline spray for congestion. Augmentin per orders. Follow up as needed.

## 2014-05-10 NOTE — Patient Instructions (Signed)
Please take antibiotic as directed.  Increase fluid intake.  Use Saline nasal spray.  Take a daily multivitamin. Use Plain Mucinex to help with mucous production.  Place a humidifier in the bedroom.  Please call or return clinic if symptoms are not improving.  Sinusitis Sinusitis is redness, soreness, and swelling (inflammation) of the paranasal sinuses. Paranasal sinuses are air pockets within the bones of your face (beneath the eyes, the middle of the forehead, or above the eyes). In healthy paranasal sinuses, mucus is able to drain out, and air is able to circulate through them by way of your nose. However, when your paranasal sinuses are inflamed, mucus and air can become trapped. This can allow bacteria and other germs to grow and cause infection. Sinusitis can develop quickly and last only a short time (acute) or continue over a long period (chronic). Sinusitis that lasts for more than 12 weeks is considered chronic.  CAUSES  Causes of sinusitis include:  Allergies.  Structural abnormalities, such as displacement of the cartilage that separates your nostrils (deviated septum), which can decrease the air flow through your nose and sinuses and affect sinus drainage.  Functional abnormalities, such as when the small hairs (cilia) that line your sinuses and help remove mucus do not work properly or are not present. SYMPTOMS  Symptoms of acute and chronic sinusitis are the same. The primary symptoms are pain and pressure around the affected sinuses. Other symptoms include:  Upper toothache.  Earache.  Headache.  Bad breath.  Decreased sense of smell and taste.  A cough, which worsens when you are lying flat.  Fatigue.  Fever.  Thick drainage from your nose, which often is green and may contain pus (purulent).  Swelling and warmth over the affected sinuses. DIAGNOSIS  Your caregiver will perform a physical exam. During the exam, your caregiver may:  Look in your nose for signs  of abnormal growths in your nostrils (nasal polyps).  Tap over the affected sinus to check for signs of infection.  View the inside of your sinuses (endoscopy) with a special imaging device with a light attached (endoscope), which is inserted into your sinuses. If your caregiver suspects that you have chronic sinusitis, one or more of the following tests may be recommended:  Allergy tests.  Nasal culture A sample of mucus is taken from your nose and sent to a lab and screened for bacteria.  Nasal cytology A sample of mucus is taken from your nose and examined by your caregiver to determine if your sinusitis is related to an allergy. TREATMENT  Most cases of acute sinusitis are related to a viral infection and will resolve on their own within 10 days. Sometimes medicines are prescribed to help relieve symptoms (pain medicine, decongestants, nasal steroid sprays, or saline sprays).  However, for sinusitis related to a bacterial infection, your caregiver will prescribe antibiotic medicines. These are medicines that will help kill the bacteria causing the infection.  Rarely, sinusitis is caused by a fungal infection. In theses cases, your caregiver will prescribe antifungal medicine. For some cases of chronic sinusitis, surgery is needed. Generally, these are cases in which sinusitis recurs more than 3 times per year, despite other treatments. HOME CARE INSTRUCTIONS   Drink plenty of water. Water helps thin the mucus so your sinuses can drain more easily.  Use a humidifier.  Inhale steam 3 to 4 times a day (for example, sit in the bathroom with the shower running).  Apply a warm, moist washcloth to  your face 3 to 4 times a day, or as directed by your caregiver.  Use saline nasal sprays to help moisten and clean your sinuses.  Take over-the-counter or prescription medicines for pain, discomfort, or fever only as directed by your caregiver. SEEK IMMEDIATE MEDICAL CARE IF:  You have  increasing pain or severe headaches.  You have nausea, vomiting, or drowsiness.  You have swelling around your face.  You have vision problems.  You have a stiff neck.  You have difficulty breathing. MAKE SURE YOU:   Understand these instructions.  Will watch your condition.  Will get help right away if you are not doing well or get worse. Document Released: 03/15/2005 Document Revised: 06/07/2011 Document Reviewed: 03/30/2011 [facility] Patient Information 2014 [place], [place].

## 2014-05-10 NOTE — Progress Notes (Signed)
Pre visit review using our clinic review tool, if applicable. No additional management support is needed unless otherwise documented below in the visit note/SLS  

## 2014-05-16 NOTE — Telephone Encounter (Signed)
As pt had normal physical exam and significant improvement with left sided pain by treating constipation, ok to remove from work queue.  Thank you for update.

## 2014-05-22 DIAGNOSIS — M5418 Radiculopathy, sacral and sacrococcygeal region: Secondary | ICD-10-CM | POA: Diagnosis not present

## 2014-05-31 ENCOUNTER — Ambulatory Visit (INDEPENDENT_AMBULATORY_CARE_PROVIDER_SITE_OTHER): Payer: 59 | Admitting: Physician Assistant

## 2014-05-31 ENCOUNTER — Encounter: Payer: Self-pay | Admitting: Physician Assistant

## 2014-05-31 ENCOUNTER — Ambulatory Visit (HOSPITAL_BASED_OUTPATIENT_CLINIC_OR_DEPARTMENT_OTHER)
Admission: RE | Admit: 2014-05-31 | Discharge: 2014-05-31 | Disposition: A | Discharge status: Home / Self Care | Payer: 59 | Source: Ambulatory Visit | Attending: Physician Assistant | Admitting: Physician Assistant

## 2014-05-31 ENCOUNTER — Ambulatory Visit: Payer: Medicare Other | Admitting: Physician Assistant

## 2014-05-31 VITALS — BP 124/76 | HR 78 | Temp 98.2°F | Resp 16 | Ht 61.0 in | Wt 162.5 lb

## 2014-05-31 DIAGNOSIS — R05 Cough: Secondary | ICD-10-CM | POA: Diagnosis not present

## 2014-05-31 DIAGNOSIS — J208 Acute bronchitis due to other specified organisms: Secondary | ICD-10-CM

## 2014-05-31 DIAGNOSIS — R0602 Shortness of breath: Secondary | ICD-10-CM | POA: Insufficient documentation

## 2014-05-31 DIAGNOSIS — J00 Acute nasopharyngitis [common cold]: Secondary | ICD-10-CM

## 2014-05-31 DIAGNOSIS — B9689 Other specified bacterial agents as the cause of diseases classified elsewhere: Secondary | ICD-10-CM | POA: Insufficient documentation

## 2014-05-31 MED ORDER — LEVOFLOXACIN 750 MG PO TABS: 750.0000 mg | ORAL_TABLET | Freq: Every day | ORAL | Status: DC

## 2014-05-31 MED ORDER — BENZONATATE 200 MG PO CAPS: 200.0000 mg | ORAL_CAPSULE | Freq: Three times a day (TID) | ORAL | Status: DC | PRN

## 2014-05-31 NOTE — Progress Notes (Signed)
History of Present Illness: [name] is a 64 y.o. female who present to the clinic today complaining of recurrence sinus pressure, sinus pain and nasal congestion. Patient endorses symptoms have now moved into her chest.  Endorses SOB, cough productive green sputum.  Patient denies chest pain or fever. Patient was seen 1 month ago and treated for bacterial sinusitis with Augmentin. Endorses improvement initially, but then symptoms worsened.  History: Past Medical History  Diagnosis Date  . GERD (gastroesophageal reflux disease)   . Arachnoiditis   . Environmental allergies   . Elevated liver enzymes   . Depression   . Migraine with typical aura     resolved years ago  . H/O being hospitalized 03/2012    x1 week for nausea  . Nausea     chronic nausea   . History of chicken pox   . Allergy     seasonal    Current outpatient prescriptions:  .  Brexpiprazole 2 MG TABS, Take 2 mg by mouth every morning., Disp: , Rfl:  .  clonazePAM (KLONOPIN) 0.5 MG tablet, Take 0.5 mg by mouth 2 (two) times daily., Disp: , Rfl:  .  fentaNYL (DURAGESIC - DOSED MCG/HR) 50 MCG/HR, See admin instructions. Every 3 days, Disp: , Rfl:  .  gabapentin (NEURONTIN) 600 MG tablet, Take 600 mg by mouth. X[5]Take 2 in the Am, 1 in Afternoon, and 2 in the PM, Disp: , Rfl:  .  omeprazole (PRILOSEC) 40 MG capsule, TAKE 1 CAPSULE (40 MG TOTAL) BY MOUTH DAILY., Disp: 30 capsule, Rfl: 2 .  PARoxetine (PAXIL) 30 MG tablet, Take 1 tablet (30 mg total) by mouth daily., Disp: , Rfl:  .  benzonatate (TESSALON) 200 MG capsule, Take 1 capsule (200 mg total) by mouth 3 (three) times daily as needed for cough., Disp: 30 capsule, Rfl: 0 .  levofloxacin (LEVAQUIN) 750 MG tablet, Take 1 tablet (750 mg total) by mouth daily., Disp: 7 tablet, Rfl: 0 Allergies  Allergen Reactions  . Compazine [Prochlorperazine]     hyperactivity  . Morphine And Related Hives and Itching   Family History  Problem Relation Age of Onset  .  Alzheimer's disease Mother   . Hypertension Mother   . Migraines Mother   . Hypertension Father   . Other Father     ?carotid artery aneurysm   History   Social History  . Marital Status: Married    Spouse Name: N/A  . Number of Children: N/A  . Years of Education: N/A   Social History Main Topics  . Smoking status: Former Smoker    Quit date: 08/09/1986  . Smokeless tobacco: Never Used  . Alcohol Use: No  . Drug Use: No  . Sexual Activity: No     Comment: husband had vasectomy.    Other Topics Concern  . None   Social History Narrative   2 step daughters- 51 and 58   She worked as an Web designer.   Enjoys puzzles, counted cross stitch.   Completed 1 year of college   4 cats    Review of Systems: See HPI.  All other ROS are negative.  Physical Examination: BP 124/76 mmHg  Pulse 78  Temp(Src) 98.2 F (36.8 C) (Oral)  Resp 16  Ht 5\' 1"  (1.549 m)  Wt 162 lb 8 oz (73.71 kg)  BMI 30.72 kg/m2  SpO2 94%  LMP 03/29/1993  General appearance: alert, cooperative and appears stated age Head: Normocephalic, without obvious abnormality, atraumatic, sinuses  tender to percussion Eyes: conjunctivae/corneas clear. PERRL, EOM's intact. Fundi benign. Ears: normal TM's and external ear canals both ears Nose: moderate congestion, turbinates swollen, sinus tenderness bilateral Throat: lips, mucosa, and tongue normal; teeth and gums normal Neck: no adenopathy, no carotid bruit, no JVD, supple, symmetrical, trachea midline and thyroid not enlarged, symmetric, no tenderness/mass/nodules Lungs: clear to auscultation bilaterally Chest wall: no tenderness  Assessment/Plan: Acute bacterial bronchitis Exam good.  O2 at 94%.  Giving severity and chronicity of symptoms, will obtain CXR. Rx Levaquin.  Tessalon per orders.  Supportive measures discussed.

## 2014-05-31 NOTE — Progress Notes (Signed)
Pre visit review using our clinic review tool, if applicable. No additional management support is needed unless otherwise documented below in the visit note/SLS  

## 2014-05-31 NOTE — Assessment & Plan Note (Signed)
Exam good.  O2 at 94%.  Giving severity and chronicity of symptoms, will obtain CXR. Rx Levaquin.  Tessalon per orders.  Supportive measures discussed.

## 2014-05-31 NOTE — Patient Instructions (Signed)
Please go downstairs for x-ray.  I will give you a call with your results over the weekend if anything worrisome is noted. Otherwise I will call on Monday.  Take antibiotic as directed.  Use the Tessalon for cough. Stay well hydrated.  Get plenty of rest. Can use Mucinex to help with congestion.

## 2014-06-20 DIAGNOSIS — G039 Meningitis, unspecified: Secondary | ICD-10-CM | POA: Diagnosis not present

## 2014-06-20 DIAGNOSIS — M5418 Radiculopathy, sacral and sacrococcygeal region: Secondary | ICD-10-CM | POA: Diagnosis not present

## 2014-06-20 DIAGNOSIS — M47817 Spondylosis without myelopathy or radiculopathy, lumbosacral region: Secondary | ICD-10-CM | POA: Diagnosis not present

## 2014-07-07 DIAGNOSIS — M5126 Other intervertebral disc displacement, lumbar region: Secondary | ICD-10-CM | POA: Diagnosis not present

## 2014-07-24 DIAGNOSIS — Z79899 Other long term (current) drug therapy: Secondary | ICD-10-CM | POA: Diagnosis not present

## 2014-07-24 DIAGNOSIS — G039 Meningitis, unspecified: Secondary | ICD-10-CM | POA: Diagnosis not present

## 2014-09-11 ENCOUNTER — Encounter: Payer: Self-pay | Admitting: Physician Assistant

## 2014-09-11 ENCOUNTER — Ambulatory Visit (INDEPENDENT_AMBULATORY_CARE_PROVIDER_SITE_OTHER): Payer: 59 | Admitting: Physician Assistant

## 2014-09-11 VITALS — BP 156/83 | HR 64 | Temp 97.7°F | Resp 16 | Ht 61.0 in | Wt 167.4 lb

## 2014-09-11 DIAGNOSIS — J208 Acute bronchitis due to other specified organisms: Principal | ICD-10-CM

## 2014-09-11 DIAGNOSIS — J4531 Mild persistent asthma with (acute) exacerbation: Secondary | ICD-10-CM | POA: Diagnosis not present

## 2014-09-11 DIAGNOSIS — J209 Acute bronchitis, unspecified: Secondary | ICD-10-CM | POA: Diagnosis not present

## 2014-09-11 DIAGNOSIS — J45909 Unspecified asthma, uncomplicated: Secondary | ICD-10-CM | POA: Insufficient documentation

## 2014-09-11 DIAGNOSIS — B9689 Other specified bacterial agents as the cause of diseases classified elsewhere: Secondary | ICD-10-CM

## 2014-09-11 MED ORDER — ALBUTEROL SULFATE HFA 108 (90 BASE) MCG/ACT IN AERS: 2.0000 | INHALATION_SPRAY | Freq: Four times a day (QID) | RESPIRATORY_TRACT | Status: DC | PRN

## 2014-09-11 MED ORDER — METHYLPREDNISOLONE 4 MG PO TBPK: ORAL_TABLET | ORAL | Status: DC

## 2014-09-11 MED ORDER — AZITHROMYCIN 250 MG PO TABS: ORAL_TABLET | ORAL | Status: DC

## 2014-09-11 MED ORDER — BENZONATATE 200 MG PO CAPS
200.0000 mg | ORAL_CAPSULE | Freq: Two times a day (BID) | ORAL | Status: DC | PRN
Start: 2014-09-11 — End: 2014-09-24

## 2014-09-11 MED ORDER — ALBUTEROL SULFATE (2.5 MG/3ML) 0.083% IN NEBU
2.5000 mg | INHALATION_SOLUTION | Freq: Once | RESPIRATORY_TRACT | Status: AC
  Administered 2014-09-11: 2.5 mg via RESPIRATORY_TRACT

## 2014-09-11 NOTE — Patient Instructions (Signed)
Please take the antibiotic as directed.   Stay well hydrated and get plenty of rest. Take the steroid pack as directed.  Use the albuterol inhaler every 6 hours as needed for wheeze. Continue the Allegra but start an over-the-counter Flonase for allergies.  Follow-up in 1 week.  Metered Dose Inhaler (No Spacer Used) Inhaled medicines are the basis of treatment for asthma and other breathing problems. Inhaled medicine can only be effective if used properly. Good technique assures that the medicine reaches the lungs. Metered dose inhalers (MDIs) are used to deliver a variety of inhaled medicines. These include quick relief or rescue medicines (such as bronchodilators) and controller medicines (such as corticosteroids). The medicine is delivered by pushing down on a metal canister to release a set amount of spray. If you are using different kinds of inhalers, use your quick relief medicine to open the airways 10-15 minutes before using a steroid, if instructed to do so by your health care provider. If you are unsure which inhalers to use and the order of using them, ask your health care provider, nurse, or respiratory therapist. HOW TO USE THE INHALER 1. Remove the cap from the inhaler. 2. If you are using the inhaler for the first time, you will need to prime it. Shake the inhaler for 5 seconds and release four puffs into the air, away from your face. Ask your health care provider or pharmacist if you have questions about priming your inhaler. 3. Shake the inhaler for 5 seconds before each breath in (inhalation). 4. Position the inhaler so that the top of the canister faces up. 5. Put your index finger on the top of the medicine canister. Your thumb supports the bottom of the inhaler. 6. Open your mouth. 7. Either place the inhaler between your teeth and place your lips tightly around the mouthpiece, or hold the inhaler 1-2 inches away from your open mouth. If you are unsure of which technique to  use, ask your health care provider. 8. Breathe out (exhale) normally and as completely as possible. 9. Press the canister down with the index finger to release the medicine. 10. At the same time as the canister is pressed, inhale deeply and slowly until your lungs are completely filled. This should take 4-6 seconds. Keep your tongue down. 11. Hold the medicine in your lungs for 5-10 seconds (10 seconds is best). This helps the medicine get into the small airways of your lungs. 12. Breathe out slowly, through pursed lips. Whistling is an example of pursed lips. 13. Wait at least 1 minute between puffs. Continue with the above steps until you have taken the number of puffs your health care provider has ordered. Do not use the inhaler more than your health care provider directs you to. 14. Replace the cap on the inhaler. 15. Follow the directions from your health care provider or the inhaler insert for cleaning the inhaler. If you are using a steroid inhaler, after your last puff, rinse your mouth with water, gargle, and spit out the water. Do not swallow the water. AVOID:  Inhaling before or after starting the spray of medicine. It takes practice to coordinate your breathing with triggering the spray.  Inhaling through the nose (rather than the mouth) when triggering the spray. HOW TO DETERMINE IF YOUR INHALER IS FULL OR NEARLY EMPTY You cannot know when an inhaler is empty by shaking it. Some inhalers are now being made with dose counters. Ask your health care provider for a prescription  that has a dose counter if you feel you need that extra help. If your inhaler does not have a counter, ask your health care provider to help you determine the date you need to refill your inhaler. Write the refill date on a calendar or your inhaler canister. Refill your inhaler 7-10 days before it runs out. Be sure to keep an adequate supply of medicine. This includes making sure it has not expired, and making sure  you have a spare inhaler. SEEK MEDICAL CARE IF:  Symptoms are only partially relieved with your inhaler.  You are having trouble using your inhaler.  You experience an increase in phlegm. SEEK IMMEDIATE MEDICAL CARE IF:  You feel little or no relief with your inhalers. You are still wheezing and feeling shortness of breath, tightness in your chest, or both.  You have dizziness, headaches, or a fast heart rate.  You have chills, fever, or night sweats.  There is a noticeable increase in phlegm production, or there is blood in the phlegm. MAKE SURE YOU:  Understand these instructions.  Will watch your condition.  Will get help right away if you are not doing well or get worse. Document Released: 01/10/2007 Document Revised: 07/30/2013 Document Reviewed: 08/31/2012 [facility] Patient Information 2015 [place], [place]. This information is not intended to replace advice given to you by your health care provider. Make sure you discuss any questions you have with your health care provider.

## 2014-09-11 NOTE — Assessment & Plan Note (Signed)
Rx Z pack and Tessalon Perles. Supportive measures reviewed.  Patient also given albuterol inhaler and steroid pack for reactive airway disease.  Continue allergy regimen.

## 2014-09-11 NOTE — Progress Notes (Signed)
Patient presents to clinic today c/o non-producitve cough x 1 week with SOB and chest tightness with wheezing. Denies fever, chills, recent travel or sick contact. Endorses history of allergies that seem to be worsening despite OTC medication.  Denies chest pain, palpitations, lightheadedness or dizziness. Endorses mild nausea over the past couple of days.  Past Medical History  Diagnosis Date  . GERD (gastroesophageal reflux disease)   . Arachnoiditis   . Environmental allergies   . Elevated liver enzymes   . Depression   . Migraine with typical aura     resolved years ago  . H/O being hospitalized 03/2012    x1 week for nausea  . Nausea     chronic nausea   . History of chicken pox   . Allergy     seasonal    Current Outpatient Prescriptions on File Prior to Visit  Medication Sig Dispense Refill  . Brexpiprazole 2 MG TABS Take 2 mg by mouth every morning.    . clonazePAM (KLONOPIN) 0.5 MG tablet Take 0.5 mg by mouth 2 (two) times daily.    . fentaNYL (DURAGESIC - DOSED MCG/HR) 50 MCG/HR Place 75 mcg onto the skin every 3 (three) days. Every 3 days    . gabapentin (NEURONTIN) 600 MG tablet Take 600 mg by mouth. X[5]Take 2 in the Am, 1 in Afternoon, and 2 in the PM    . omeprazole (PRILOSEC) 40 MG capsule TAKE 1 CAPSULE (40 MG TOTAL) BY MOUTH DAILY. 30 capsule 2  . PARoxetine (PAXIL) 30 MG tablet Take 1 tablet (30 mg total) by mouth daily.     No current facility-administered medications on file prior to visit.    Allergies  Allergen Reactions  . Compazine [Prochlorperazine]     hyperactivity  . Morphine And Related Hives and Itching    Family History  Problem Relation Age of Onset  . Alzheimer's disease Mother   . Hypertension Mother   . Migraines Mother   . Hypertension Father   . Other Father     ?carotid artery aneurysm    History   Social History  . Marital Status: Married    Spouse Name: N/A  . Number of Children: N/A  . Years of Education: N/A    Social History Main Topics  . Smoking status: Former Smoker    Quit date: 08/09/1986  . Smokeless tobacco: Never Used  . Alcohol Use: No  . Drug Use: No  . Sexual Activity: No     Comment: husband had vasectomy.    Other Topics Concern  . None   Social History Narrative   2 step daughters- 80 and 85   She worked as an Web designer.   Enjoys puzzles, counted cross stitch.   Completed 1 year of college   4 cats   Review of Systems - See HPI.  All other ROS are negative.  BP 156/83 mmHg  Pulse 64  Temp(Src) 97.7 F (36.5 C) (Oral)  Resp 16  Ht 5\' 1"  (1.549 m)  Wt 167 lb 6 oz (75.921 kg)  BMI 31.64 kg/m2  SpO2 95%  LMP 03/29/1993  Physical Exam  Constitutional: She is well-developed, well-nourished, and in no distress.  HENT:  Head: Normocephalic and atraumatic.  Right Ear: External ear normal.  Left Ear: External ear normal.  Nose: Nose normal.  Mouth/Throat: Oropharynx is clear and moist. No oropharyngeal exudate.  TM within normal limits bilaterally.  Eyes: Conjunctivae are normal. Pupils are equal, round, and reactive  to light.  Neck: Neck supple.  Cardiovascular: Normal rate, regular rhythm, normal heart sounds and intact distal pulses.   Pulmonary/Chest: Effort normal. No respiratory distress. She has wheezes. She has no rales. She exhibits no tenderness.  Lymphadenopathy:    She has no cervical adenopathy.   Assessment/Plan: Acute bacterial bronchitis Rx Z pack and Tessalon Perles. Supportive measures reviewed.  Patient also given albuterol inhaler and steroid pack for reactive airway disease.  Continue allergy regimen.  Reactive airway disease Rx Z pack and Tessalon Perles. Supportive measures reviewed.  Patient also given albuterol inhaler and steroid pack for reactive airway disease.  Continue allergy regimen.

## 2014-09-18 ENCOUNTER — Encounter: Payer: Self-pay | Admitting: Physician Assistant

## 2014-09-18 ENCOUNTER — Ambulatory Visit (INDEPENDENT_AMBULATORY_CARE_PROVIDER_SITE_OTHER): Payer: 59 | Admitting: Physician Assistant

## 2014-09-18 VITALS — BP 130/80 | HR 66 | Temp 98.3°F | Resp 16 | Ht 61.0 in | Wt 165.4 lb

## 2014-09-18 DIAGNOSIS — J4531 Mild persistent asthma with (acute) exacerbation: Secondary | ICD-10-CM

## 2014-09-18 MED ORDER — METHYLPREDNISOLONE 4 MG PO TBPK: ORAL_TABLET | ORAL | Status: DC

## 2014-09-18 MED ORDER — AMOXICILLIN-POT CLAVULANATE 875-125 MG PO TABS: 1.0000 | ORAL_TABLET | Freq: Two times a day (BID) | ORAL | Status: DC

## 2014-09-18 MED ORDER — IPRATROPIUM-ALBUTEROL 0.5-2.5 (3) MG/3ML IN SOLN: 3.0000 mL | Freq: Four times a day (QID) | RESPIRATORY_TRACT | Status: DC

## 2014-09-18 NOTE — Assessment & Plan Note (Signed)
With continued bronchitis symptoms. Breathing improved but sill intermittent tightness and wheeze.  Duobneb given with 3% O2 increase.  Rx Augmentin. Rx Medrol dose pack.  Supportive measures reviewed. Continue inhalers as directed. Follow-up if symptoms not improving.

## 2014-09-18 NOTE — Patient Instructions (Signed)
Please take new antibiotic as directed.  Restart Medrol taking as directed. Continue inhalers and stay well hydrated.  Get plenty of rest. Call or return to clinic if symptoms are not resolving.

## 2014-09-18 NOTE — Progress Notes (Signed)
Patient presents to clinic today for follow-up of acute bronchitis and reactive airway disease.  Has completed Z-pack but notes continued chest congestion, cough, sinus pressure/pain.  Endorses breathing is better but does endorses intermittent chest tightness. Is using inhalers as directed.  Past Medical History  Diagnosis Date  . GERD (gastroesophageal reflux disease)   . Arachnoiditis   . Environmental allergies   . Elevated liver enzymes   . Depression   . Migraine with typical aura     resolved years ago  . H/O being hospitalized 03/2012    x1 week for nausea  . Nausea     chronic nausea   . History of chicken pox   . Allergy     seasonal    Current Outpatient Prescriptions on File Prior to Visit  Medication Sig Dispense Refill  . albuterol (PROVENTIL HFA;VENTOLIN HFA) 108 (90 BASE) MCG/ACT inhaler Inhale 2 puffs into the lungs every 6 (six) hours as needed for wheezing or shortness of breath. 1 Inhaler 0  . benzonatate (TESSALON) 200 MG capsule Take 1 capsule (200 mg total) by mouth 2 (two) times daily as needed for cough. 20 capsule 0  . clonazePAM (KLONOPIN) 0.5 MG tablet Take 0.5 mg by mouth 2 (two) times daily.    . fentaNYL (DURAGESIC - DOSED MCG/HR) 50 MCG/HR Place 75 mcg onto the skin every 3 (three) days. Every 3 days    . gabapentin (NEURONTIN) 600 MG tablet Take 600 mg by mouth. X[5]Take 2 in the Am, 1 in Afternoon, and 2 in the PM    . PARoxetine (PAXIL) 30 MG tablet Take 1 tablet (30 mg total) by mouth daily.     No current facility-administered medications on file prior to visit.    Allergies  Allergen Reactions  . Compazine [Prochlorperazine]     hyperactivity  . Morphine And Related Hives and Itching    Family History  Problem Relation Age of Onset  . Alzheimer's disease Mother   . Hypertension Mother   . Migraines Mother   . Hypertension Father   . Other Father     ?carotid artery aneurysm    History   Social History  . Marital Status:  Married    Spouse Name: N/A  . Number of Children: N/A  . Years of Education: N/A   Social History Main Topics  . Smoking status: Former Smoker    Quit date: 08/09/1986  . Smokeless tobacco: Never Used  . Alcohol Use: No  . Drug Use: No  . Sexual Activity: No     Comment: husband had vasectomy.    Other Topics Concern  . None   Social History Narrative   2 step daughters- 72 and 74   She worked as an Web designer.   Enjoys puzzles, counted cross stitch.   Completed 1 year of college   4 cats   Review of Systems - See HPI.  All other ROS are negative.  BP 130/80 mmHg  Pulse 66  Temp(Src) 98.3 F (36.8 C) (Oral)  Resp 16  Ht 5\' 1"  (1.549 m)  Wt 165 lb 6.4 oz (75.025 kg)  BMI 31.27 kg/m2  SpO2 95%  LMP 03/29/1993  Physical Exam  Constitutional: She is oriented to person, place, and time and well-developed, well-nourished, and in no distress.  HENT:  Head: Normocephalic and atraumatic.  Right Ear: External ear normal.  Left Ear: External ear normal.  Nose: Nose normal.  Mouth/Throat: Oropharynx is clear and moist. No  oropharyngeal exudate.  TM within normal limits.  Eyes: Conjunctivae are normal. Pupils are equal, round, and reactive to light.  Neck: Neck supple.  Cardiovascular: Normal rate, regular rhythm, normal heart sounds and intact distal pulses.   Pulmonary/Chest: Effort normal and breath sounds normal. No respiratory distress. She has no wheezes. She has no rales. She exhibits no tenderness.  Neurological: She is alert and oriented to person, place, and time.  Skin: Skin is warm and dry. No rash noted.  Vitals reviewed.  Assessment/Plan: Reactive airway disease With continued bronchitis symptoms. Breathing improved but sill intermittent tightness and wheeze.  Duobneb given with 3% O2 increase.  Rx Augmentin. Rx Medrol dose pack.  Supportive measures reviewed. Continue inhalers as directed. Follow-up if symptoms not improving.

## 2014-09-18 NOTE — Progress Notes (Signed)
Pre visit review using our clinic review tool, if applicable. No additional management support is needed unless otherwise documented below in the visit note. 

## 2014-09-24 ENCOUNTER — Other Ambulatory Visit: Payer: Self-pay | Admitting: Physician Assistant

## 2014-10-01 DIAGNOSIS — G039 Meningitis, unspecified: Secondary | ICD-10-CM | POA: Diagnosis not present

## 2014-10-01 DIAGNOSIS — R7989 Other specified abnormal findings of blood chemistry: Secondary | ICD-10-CM | POA: Diagnosis not present

## 2014-10-16 ENCOUNTER — Ambulatory Visit (INDEPENDENT_AMBULATORY_CARE_PROVIDER_SITE_OTHER): Payer: 59 | Admitting: Family

## 2014-10-16 ENCOUNTER — Encounter: Payer: Self-pay | Admitting: Family

## 2014-10-16 VITALS — BP 118/76 | HR 74 | Temp 98.4°F | Resp 16 | Ht 61.75 in | Wt 169.8 lb

## 2014-10-16 DIAGNOSIS — R110 Nausea: Secondary | ICD-10-CM | POA: Diagnosis not present

## 2014-10-16 DIAGNOSIS — K219 Gastro-esophageal reflux disease without esophagitis: Secondary | ICD-10-CM | POA: Insufficient documentation

## 2014-10-16 DIAGNOSIS — F329 Major depressive disorder, single episode, unspecified: Secondary | ICD-10-CM

## 2014-10-16 DIAGNOSIS — F418 Other specified anxiety disorders: Secondary | ICD-10-CM | POA: Diagnosis not present

## 2014-10-16 DIAGNOSIS — J309 Allergic rhinitis, unspecified: Secondary | ICD-10-CM | POA: Insufficient documentation

## 2014-10-16 DIAGNOSIS — F32A Depression, unspecified: Secondary | ICD-10-CM

## 2014-10-16 DIAGNOSIS — F419 Anxiety disorder, unspecified: Principal | ICD-10-CM

## 2014-10-16 MED ORDER — OMEPRAZOLE 40 MG PO CPDR: 40.0000 mg | DELAYED_RELEASE_CAPSULE | Freq: Every day | ORAL | Status: DC

## 2014-10-16 MED ORDER — FEXOFENADINE-PSEUDOEPHED ER 180-240 MG PO TB24: 1.0000 | ORAL_TABLET | Freq: Every day | ORAL | Status: DC | PRN

## 2014-10-16 NOTE — Assessment & Plan Note (Signed)
Add PPI, i think this is cause for her hoarseness.

## 2014-10-16 NOTE — Assessment & Plan Note (Signed)
Improved, management per psychiatry.

## 2014-10-16 NOTE — Assessment & Plan Note (Signed)
Use nasonex ever day.  Use allegra-D daily until symptoms are improved then as needed.

## 2014-10-16 NOTE — Progress Notes (Signed)
Subjective:    Patient ID: [name], female    DOB: 12/31/1950, 64 y.o.   MRN: [id number]  HPI  [name] is a 64 yr old female who presents today to discuss several symptoms.  She reports intermittent nasal congestion and daily headaches x 1 month.  Reports hoarseness and GI upset x 5 days.  She reports + post nasal drip. No fever.  She reports some facial and frontal sinus pressure.  Attributes her HA's to allergies.  She uses nasocort on/off.    Anxiety/Depression- Maintained on paxil, klonopin and Rixulti under the care of Dr. Elane Fritz. Reports that this is well controlled.   Review of Systems See HPI  Past Medical History  Diagnosis Date  . GERD (gastroesophageal reflux disease)   . Arachnoiditis   . Environmental allergies   . Elevated liver enzymes   . Depression   . Migraine with typical aura     resolved years ago  . H/O being hospitalized 03/2012    x1 week for nausea  . Nausea     chronic nausea   . History of chicken pox   . Allergy     seasonal    History   Social History  . Marital Status: Married    Spouse Name: N/A  . Number of Children: N/A  . Years of Education: N/A   Occupational History  . Not on file.   Social History Main Topics  . Smoking status: Former Smoker    Quit date: 08/09/1986  . Smokeless tobacco: Never Used  . Alcohol Use: No  . Drug Use: No  . Sexual Activity: No     Comment: husband had vasectomy.    Other Topics Concern  . Not on file   Social History Narrative   2 step daughters- 57 and 71   She worked as an Web designer.   Enjoys puzzles, counted cross stitch.   Completed 1 year of college   4 cats    Past Surgical History  Procedure Laterality Date  . Abdominoplasty  07/11/09  . Eus N/A 05/05/2012    Procedure: UPPER ENDOSCOPIC ULTRASOUND (EUS) LINEAR;  Surgeon: Beryle Beams, MD;  Location: [facility];  Service: Endoscopy;  Laterality: N/A;  . Laparoscopic cholecystectomy  2010  . Nasal  sinus surgery      "Has had 2-3 surgeries over the years"    Family History  Problem Relation Age of Onset  . Alzheimer's disease Mother   . Hypertension Mother   . Migraines Mother   . Hypertension Father   . Other Father     ?carotid artery aneurysm    Allergies  Allergen Reactions  . Compazine [Prochlorperazine]     hyperactivity  . Morphine And Related Hives and Itching    Current Outpatient Prescriptions on File Prior to Visit  Medication Sig Dispense Refill  . albuterol (PROVENTIL HFA;VENTOLIN HFA) 108 (90 BASE) MCG/ACT inhaler Inhale 2 puffs into the lungs every 6 (six) hours as needed for wheezing or shortness of breath. 1 Inhaler 0  . clonazePAM (KLONOPIN) 0.5 MG tablet Take 0.5 mg by mouth 2 (two) times daily.    . fentaNYL (DURAGESIC - DOSED MCG/HR) 50 MCG/HR Place 75 mcg onto the skin every 3 (three) days. Every 3 days    . gabapentin (NEURONTIN) 600 MG tablet Take 600 mg by mouth. X[5]Take 2 in the Am, 1 in Afternoon, and 2 in the PM    . PARoxetine (PAXIL) 30 MG tablet  Take 1 tablet (30 mg total) by mouth daily.     Current Facility-Administered Medications on File Prior to Visit  Medication Dose Route Frequency Provider Last Rate Last Dose  . ipratropium-albuterol (DUONEB) 0.5-2.5 (3) MG/3ML nebulizer solution 3 mL  3 mL Nebulization Q6H Brunetta Jeans, PA-C        BP 118/76 mmHg  Pulse 74  Temp(Src) 98.4 F (36.9 C) (Oral)  Resp 16  Ht 5' 1.75" (1.568 m)  Wt 169 lb 12.8 oz (77.021 kg)  BMI 31.33 kg/m2  SpO2 94%  LMP 03/29/1993       Objective:   Physical Exam  Constitutional: She is oriented to person, place, and time. She appears well-developed and well-nourished.  HENT:  Head: Normocephalic and atraumatic.  Cardiovascular: Normal rate, regular rhythm and normal heart sounds.   No murmur heard. Pulmonary/Chest: Effort normal and breath sounds normal. No respiratory distress. She has no wheezes.  Neurological: She is alert and oriented to  person, place, and time.  Skin: Skin is warm.  Psychiatric: She has a normal mood and affect. Her behavior is normal. Judgment and thought content normal.          Assessment & Plan:

## 2014-10-16 NOTE — Progress Notes (Signed)
Pre visit review using our clinic review tool, if applicable. No additional management support is needed unless otherwise documented below in the visit note. 

## 2014-10-16 NOTE — Patient Instructions (Addendum)
For Reflux- start omeprazole 40mg  once daily. Call if symptoms worsen or if they do not improve in 1-2 weeks.   For Allergies- use nasonex ever day.  Use allegra-D daily until symptoms are improved then as needed.  For nausea- consider an alternative to the duragesic patch as I believe that this may be cause of your nausea.  Studies show >10% of people on Fentanyl experience nausea.  Please schedule a medicare wellness visit at the front desk.

## 2014-10-16 NOTE — Assessment & Plan Note (Signed)
Suspect secondary to duragesic patch.  We discussed that she may wish to discuss other options with pain management, however at this time I don't think she is willing to change nmed.

## 2014-10-24 ENCOUNTER — Ambulatory Visit (INDEPENDENT_AMBULATORY_CARE_PROVIDER_SITE_OTHER): Payer: 59 | Admitting: Licensed Clinical Social Worker

## 2014-10-24 DIAGNOSIS — F332 Major depressive disorder, recurrent severe without psychotic features: Secondary | ICD-10-CM

## 2014-11-07 ENCOUNTER — Ambulatory Visit: Payer: 59 | Admitting: Licensed Clinical Social Worker

## 2014-11-14 ENCOUNTER — Ambulatory Visit (INDEPENDENT_AMBULATORY_CARE_PROVIDER_SITE_OTHER): Payer: 59 | Admitting: Licensed Clinical Social Worker

## 2014-11-14 DIAGNOSIS — F332 Major depressive disorder, recurrent severe without psychotic features: Secondary | ICD-10-CM

## 2014-11-25 ENCOUNTER — Telehealth: Payer: Self-pay | Admitting: Family

## 2014-11-25 ENCOUNTER — Encounter: Payer: Self-pay | Admitting: Family

## 2014-11-25 NOTE — Progress Notes (Unsigned)
Left message for patient to call back regarding Pre-visit information.

## 2014-11-25 NOTE — Telephone Encounter (Signed)
Spoke with patient regarding pre-visit.

## 2014-11-25 NOTE — Telephone Encounter (Signed)
Patient returning your call patient states pre visit questions.

## 2014-11-26 ENCOUNTER — Ambulatory Visit: Payer: 59 | Admitting: Family

## 2014-11-28 ENCOUNTER — Ambulatory Visit: Payer: 59 | Admitting: Licensed Clinical Social Worker

## 2014-12-03 ENCOUNTER — Ambulatory Visit (INDEPENDENT_AMBULATORY_CARE_PROVIDER_SITE_OTHER): Payer: 59 | Admitting: Licensed Clinical Social Worker

## 2014-12-03 DIAGNOSIS — F332 Major depressive disorder, recurrent severe without psychotic features: Secondary | ICD-10-CM

## 2014-12-06 ENCOUNTER — Ambulatory Visit: Payer: Medicare Other | Admitting: Gynecology

## 2014-12-10 ENCOUNTER — Ambulatory Visit: Payer: 59 | Admitting: Licensed Clinical Social Worker

## 2014-12-12 DIAGNOSIS — G039 Meningitis, unspecified: Secondary | ICD-10-CM | POA: Diagnosis not present

## 2014-12-12 DIAGNOSIS — M5418 Radiculopathy, sacral and sacrococcygeal region: Secondary | ICD-10-CM | POA: Diagnosis not present

## 2014-12-12 DIAGNOSIS — F339 Major depressive disorder, recurrent, unspecified: Secondary | ICD-10-CM | POA: Diagnosis not present

## 2014-12-13 ENCOUNTER — Encounter: Payer: Self-pay | Admitting: Family

## 2014-12-13 ENCOUNTER — Ambulatory Visit (INDEPENDENT_AMBULATORY_CARE_PROVIDER_SITE_OTHER): Payer: 59 | Admitting: Family

## 2014-12-13 VITALS — BP 137/83 | HR 65 | Temp 98.4°F | Resp 16 | Ht 62.0 in | Wt 167.0 lb

## 2014-12-13 DIAGNOSIS — R110 Nausea: Secondary | ICD-10-CM

## 2014-12-13 DIAGNOSIS — Z0000 Encounter for general adult medical examination without abnormal findings: Secondary | ICD-10-CM | POA: Diagnosis not present

## 2014-12-13 DIAGNOSIS — F32A Depression, unspecified: Secondary | ICD-10-CM

## 2014-12-13 DIAGNOSIS — F418 Other specified anxiety disorders: Secondary | ICD-10-CM | POA: Diagnosis not present

## 2014-12-13 DIAGNOSIS — Z1239 Encounter for other screening for malignant neoplasm of breast: Secondary | ICD-10-CM

## 2014-12-13 DIAGNOSIS — F329 Major depressive disorder, single episode, unspecified: Secondary | ICD-10-CM

## 2014-12-13 DIAGNOSIS — F419 Anxiety disorder, unspecified: Secondary | ICD-10-CM

## 2014-12-13 DIAGNOSIS — Z23 Encounter for immunization: Secondary | ICD-10-CM | POA: Diagnosis not present

## 2014-12-13 MED ORDER — ONDANSETRON 4 MG PO TBDP: 4.0000 mg | ORAL_TABLET | Freq: Three times a day (TID) | ORAL | Status: DC | PRN

## 2014-12-13 NOTE — Progress Notes (Signed)
   Subjective:    Patient ID: [name], female    DOB: 12/31/1950, 64 y.o.   MRN: [id number]  HPI    Review of Systems     Objective:   Physical Exam        Assessment & Plan:

## 2014-12-13 NOTE — Assessment & Plan Note (Signed)
Uncontrolled. Advised pt to arrange follow up with her psychiatrist.

## 2014-12-13 NOTE — Patient Instructions (Addendum)
Please arrange a follow up with Dr. Silvio Pate- I think that you are very depressed and need the care of a psychiatrist. Follow up in 6 months, sooner if problems or concerns.  If you decide to schedule with another psychiatrist, here are some numbers of local psychiatrists:  Psychiatric Services:  [facility], [place] [id number], [place], [address], [place], [place] [facility] [phone] [place], [place], [facility], [address] [place], [place]

## 2014-12-13 NOTE — Progress Notes (Signed)
Subjective:    [name] is a 64 y.o. female who presents for Medicare Annual/Subsequent preventive examination.  Preventive Screening-Counseling & Management  Tobacco History  Smoking status  . Former Smoker  . Quit date: 08/09/1986  Smokeless tobacco  . Never Used     Problems Prior to Visit 1.   Preventative:  Immunizations: tetanus up to date Diet: reports diet is poor- does not eat much at all.  Weight: 167 lb (75.751 kg)  Exercise:  Not exercising. Colonoscopy: due 2017, (Dr. Collene Mares) Dexa: was normal 2016 per Life Screen Pap Smear:  18 months ago- GYN Mammogram: due   2.  Asthma- reports no wheezing recently.   3.  Anxiety/Depression-  She is maintained on paxil.  Dr. Oren Section Sena  4.  Chronic nausea- She reports that she is seeing Dr. Marijean Bravo. Saw him yesterday. She is continued on Fentanyl.    5. Allergic rhinitis- stable on prn allegra.   Current Problems (verified) Patient Active Problem List   Diagnosis Date Noted  . Allergic rhinitis 10/16/2014  . GERD (gastroesophageal reflux disease) 10/16/2014  . Reactive airway disease 09/11/2014  . Chronic nausea 12/25/2013  . Anxiety and depression 12/25/2013  . Arachnoiditis   . Umbilical hernia 16/12/9602    Medications Prior to Visit Current Outpatient Prescriptions on File Prior to Visit  Medication Sig Dispense Refill  . clonazePAM (KLONOPIN) 0.5 MG tablet Take 0.5 mg by mouth 2 (two) times daily.    . fentaNYL (DURAGESIC - DOSED MCG/HR) 50 MCG/HR Place 75 mcg onto the skin every 3 (three) days. Every 3 days    . fexofenadine-pseudoephedrine (ALLEGRA-D ALLERGY & CONGESTION) 180-240 MG per 24 hr tablet Take 1 tablet by mouth daily as needed.    . gabapentin (NEURONTIN) 600 MG tablet Take 600 mg by mouth. X[5]Take 2 in the Am, 1 in Afternoon, and 2 in the PM    . omeprazole (PRILOSEC) 40 MG capsule Take 1 capsule (40 mg total) by mouth daily. 30 capsule 5  . PARoxetine (PAXIL) 30 MG tablet Take 1 tablet (30 mg  total) by mouth daily.     Current Facility-Administered Medications on File Prior to Visit  Medication Dose Route Frequency Provider Last Rate Last Dose  . ipratropium-albuterol (DUONEB) 0.5-2.5 (3) MG/3ML nebulizer solution 3 mL  3 mL Nebulization Q6H Brunetta Jeans, PA-C        Current Medications (verified) Current Outpatient Prescriptions  Medication Sig Dispense Refill  . clonazePAM (KLONOPIN) 0.5 MG tablet Take 0.5 mg by mouth 2 (two) times daily.    . fentaNYL (DURAGESIC - DOSED MCG/HR) 50 MCG/HR Place 75 mcg onto the skin every 3 (three) days. Every 3 days    . fexofenadine-pseudoephedrine (ALLEGRA-D ALLERGY & CONGESTION) 180-240 MG per 24 hr tablet Take 1 tablet by mouth daily as needed.    . gabapentin (NEURONTIN) 600 MG tablet Take 600 mg by mouth. X[5]Take 2 in the Am, 1 in Afternoon, and 2 in the PM    . omeprazole (PRILOSEC) 40 MG capsule Take 1 capsule (40 mg total) by mouth daily. 30 capsule 5  . PARoxetine (PAXIL) 30 MG tablet Take 1 tablet (30 mg total) by mouth daily.     Current Facility-Administered Medications  Medication Dose Route Frequency Provider Last Rate Last Dose  . ipratropium-albuterol (DUONEB) 0.5-2.5 (3) MG/3ML nebulizer solution 3 mL  3 mL Nebulization Q6H Brunetta Jeans, PA-C         Allergies (verified) Compazine and Morphine and related  PAST HISTORY  Family History Family History  Problem Relation Age of Onset  . Alzheimer's disease Mother   . Hypertension Mother   . Migraines Mother   . Hypertension Father   . Other Father     ?carotid artery aneurysm  . Cancer Brother     mouth and throat    Social History Social History  Substance Use Topics  . Smoking status: Former Smoker    Quit date: 08/09/1986  . Smokeless tobacco: Never Used  . Alcohol Use: No     Are there smokers in your home (other than you)? No  Risk Factors Current exercise habits: not exercising  Dietary issues discussed: discussed healthy diet    Cardiac risk factors: advanced age (older than 58 for men, 52 for women) and sedentary lifestyle.  Depression Screen (Note: if answer to either of the following is "Yes", a more complete depression screening is indicated)   Over the past two weeks, have you felt down, depressed or hopeless? Yes  Over the past two weeks, have you felt little interest or pleasure in doing things? Yes  Have you lost interest or pleasure in daily life? Yes  Do you often feel hopeless? Yes  Do you cry easily over simple problems? Yes  Activities of Daily Living In your present state of health, do you have any difficulty performing the following activities?:  Driving? no Managing money?  no Feeding yourself? no Getting from bed to chair? no Climbing a flight of stairs? no Preparing food and eating?: no Bathing or showering? No Getting dressed: No Getting to the toilet? No Using the toilet:No Moving around from place to place: No In the past year have you fallen or had a near fall?:No   Are you sexually active?  No  Do you have more than one partner?  No  Hearing Difficulties: No Do you often ask people to speak up or repeat themselves? No Do you experience ringing or noises in your ears? sometimes Do you have difficulty understanding soft or whispered voices? No   Do you feel that you have a problem with memory? No  Do you often misplace items? Yes- sometimes  Do you feel safe at home?  Yes  Cognitive Testing  Alert? Yes  Normal Appearance?Yes  Oriented to person? Yes  Place? Yes   Time? Yes  Recall of three objects?  Yes  Can perform simple calculations? Yes  Displays appropriate judgment?Yes  Can read the correct time from a watch face?Yes   Advanced Directives have been discussed with the patient? Yes  List the Names of Other Physician/Practitioners you currently use: 1.    Indicate any recent Medical Services you may have received from other than [facility] providers in the past year  (date may be approximate).  Immunization History  Administered Date(s) Administered  . Influenza,inj,Quad PF,36+ Mos 12/25/2013  . Td 06/06/2008  . Zoster 01/15/2011    Screening Tests Health Maintenance  Topic Date Due  . Hepatitis C Screening  [birth year]  . HIV Screening  12/27/1965  . INFLUENZA VACCINE  10/28/2014  . PAP SMEAR  11/01/2014  . COLONOSCOPY  11/20/2015  . MAMMOGRAM  12/18/2015  . TETANUS/TDAP  06/07/2018  . ZOSTAVAX  Completed    All answers were reviewed with the patient and necessary referrals were made:  O'SULLIVAN,Karia Ehresman S., NP   12/13/2014   History reviewed: allergies, current medications, past family history, past medical history, past social history, past surgical history and problem list  Review of Systems Pertinent items are noted in HPI.    Objective:     Vision by Snellen chart: right ZDG:UYQIHKV declines measurement, left QQV:ZDGLOVF declines measurement  Body mass index is 30.54 kg/(m^2). BP 137/83 mmHg  Pulse 65  Temp(Src) 98.4 F (36.9 C) (Oral)  Resp 16  Ht 5\' 2"  (1.575 m)  Wt 167 lb (75.751 kg)  BMI 30.54 kg/m2  SpO2 97%  LMP 03/29/1993   Physical Exam  Constitutional: Overweight white female. She is oriented to person, place, and time. She appears well-developed and well-nourished. No distress.  HENT:  Head: Normocephalic and atraumatic.  Right Ear: Tympanic membrane and ear canal normal.  Left Ear: Tympanic membrane and ear canal normal.  Mouth/Throat: Oropharynx is clear and moist.  Eyes: Pupils are equal, round, and reactive to light. No scleral icterus.  Neck: Normal range of motion. No thyromegaly present.  Cardiovascular: Normal rate and regular rhythm.   No murmur heard. Pulmonary/Chest: Effort normal and breath sounds normal. No respiratory distress. He has no wheezes. She has no rales. She exhibits no tenderness.  Abdominal: Soft. Bowel sounds are normal. He exhibits no distension and no mass. There is no  tenderness. There is no rebound and no guarding.  Musculoskeletal: She exhibits no edema.  Lymphadenopathy:    She has no cervical adenopathy.  Neurological: She is alert and oriented to person, place, and time.  She exhibits normal muscle tone. Coordination normal.  Skin: Skin is warm and dry.  Psychiatric: She is tearful. Affect is labile. Her behavior is normal. Judgment and thought content normal.  Breasts: Examined lying Right: Without masses, retractions, discharge or axillary adenopathy.  Left: Without masses, retractions, discharge or axillary adenopathy.  Pelvic: deferred to GYN        Assessment & Plan:      Assessment:      Plan:     During the course of the visit the patient was educated and counseled about appropriate screening and preventive services including:    Pneumococcal vaccine   Influenza vaccine  Screening mammography  Advanced directives: has NO advanced directive  - add't info requested. Referral to SW: info provided  Diet review for nutrition referral? Yes ____  Not Indicated _x___   Patient Instructions (the written plan) was given to the patient.  Medicare Attestation I have personally reviewed: The patient's medical and social history Their use of alcohol, tobacco or illicit drugs Their current medications and supplements The patient's functional ability including ADLs,fall risks, home safety risks, cognitive, and hearing and visual impairment Diet and physical activities Evidence for depression or mood disorders  The patient's weight, height, BMI, and visual acuity have been recorded in the chart.  I have made referrals, counseling, and provided education to the patient based on review of the above and I have provided the patient with a written personalized care plan for preventive services.     O'SULLIVAN,Malacki Mcphearson S., NP   12/13/2014

## 2014-12-13 NOTE — Assessment & Plan Note (Addendum)
Uncontrolled. I still suspect that this is due to duragesic patch. Advised pt to discuss with her pain management specialist. rx provided for zofran prn.

## 2014-12-13 NOTE — Progress Notes (Signed)
Pre visit review using our clinic review tool, if applicable. No additional management support is needed unless otherwise documented below in the visit note. 

## 2014-12-15 ENCOUNTER — Encounter: Payer: Self-pay | Admitting: Family

## 2014-12-17 ENCOUNTER — Ambulatory Visit: Payer: 59 | Admitting: Licensed Clinical Social Worker

## 2014-12-24 ENCOUNTER — Ambulatory Visit (HOSPITAL_BASED_OUTPATIENT_CLINIC_OR_DEPARTMENT_OTHER): Payer: Self-pay

## 2014-12-24 ENCOUNTER — Inpatient Hospital Stay (HOSPITAL_BASED_OUTPATIENT_CLINIC_OR_DEPARTMENT_OTHER)
Admission: RE | Admit: 2014-12-24 | Discharge: 2014-12-24 | Disposition: A | Discharge status: Home / Self Care | Payer: Self-pay | Source: Ambulatory Visit | Attending: Family | Admitting: Family

## 2015-01-03 ENCOUNTER — Ambulatory Visit: Payer: 59 | Admitting: Family

## 2015-01-06 ENCOUNTER — Telehealth: Payer: Self-pay | Admitting: Family

## 2015-01-06 MED ORDER — ONDANSETRON 4 MG PO TBDP: 4.0000 mg | ORAL_TABLET | Freq: Three times a day (TID) | ORAL | Status: DC | PRN

## 2015-01-06 NOTE — Telephone Encounter (Signed)
Refills sent, notified pt. 

## 2015-01-06 NOTE — Telephone Encounter (Signed)
Relation to PB:DHDI Call back number: [phone] Pharmacy: [facility], [place] [phone] (Phone) [phone] (Fax)         Reason for call:  Patient requesting  A refill ondansetron (ZOFRAN ODT) 4 MG disintegrating tablet

## 2015-01-07 ENCOUNTER — Ambulatory Visit (HOSPITAL_BASED_OUTPATIENT_CLINIC_OR_DEPARTMENT_OTHER): Payer: Self-pay

## 2015-01-17 ENCOUNTER — Ambulatory Visit (HOSPITAL_BASED_OUTPATIENT_CLINIC_OR_DEPARTMENT_OTHER): Payer: Self-pay

## 2015-01-28 DIAGNOSIS — Z79899 Other long term (current) drug therapy: Secondary | ICD-10-CM | POA: Diagnosis not present

## 2015-01-28 DIAGNOSIS — G039 Meningitis, unspecified: Secondary | ICD-10-CM | POA: Diagnosis not present

## 2015-01-29 MED ORDER — ONDANSETRON 8 MG PO TBDP: 8.0000 mg | ORAL_TABLET | Freq: Three times a day (TID) | ORAL | Status: DC | PRN

## 2015-01-29 NOTE — Telephone Encounter (Addendum)
Rx sent for 8mg  tabs.  Please cancel 4mg  tabs at pharmacy.

## 2015-01-29 NOTE — Telephone Encounter (Signed)
Spoke with Pam at [facility] and Ondansetron 4mg  was filled in October, they will cancel any additional refills if any remain. They did fill the 8mg  for pt today.

## 2015-01-29 NOTE — Telephone Encounter (Signed)
Relation to KH:TXHF  Call back [phone]  Reason for call:  Patient states she had 8MG  ondansetron (ZOFRAN ODT) and it works so much better then the 4MG  . Patient requesting an increase please advise patient directly.

## 2015-01-29 NOTE — Addendum Note (Signed)
Addended by: [name] on: 01/29/2015 12:50 PM   Modules accepted: Orders

## 2015-01-29 NOTE — Telephone Encounter (Signed)
Melissa Please advise dose change?

## 2015-02-13 DIAGNOSIS — R635 Abnormal weight gain: Secondary | ICD-10-CM | POA: Diagnosis not present

## 2015-02-13 DIAGNOSIS — K5900 Constipation, unspecified: Secondary | ICD-10-CM | POA: Diagnosis not present

## 2015-02-13 DIAGNOSIS — K5730 Diverticulosis of large intestine without perforation or abscess without bleeding: Secondary | ICD-10-CM | POA: Diagnosis not present

## 2015-02-13 DIAGNOSIS — R110 Nausea: Secondary | ICD-10-CM | POA: Diagnosis not present

## 2015-02-24 ENCOUNTER — Other Ambulatory Visit: Payer: Self-pay | Admitting: Family

## 2015-02-25 NOTE — Telephone Encounter (Signed)
Refill sent per [facility] refill protocol/SLS  

## 2015-03-03 ENCOUNTER — Other Ambulatory Visit: Payer: Self-pay | Admitting: Family

## 2015-03-03 NOTE — Telephone Encounter (Signed)
[name]-- pt received #30 ondansetron on 02/25/15 and is requesting refill now.  Please advise?

## 2015-03-05 DIAGNOSIS — M47817 Spondylosis without myelopathy or radiculopathy, lumbosacral region: Secondary | ICD-10-CM | POA: Diagnosis not present

## 2015-03-05 DIAGNOSIS — G039 Meningitis, unspecified: Secondary | ICD-10-CM | POA: Diagnosis not present

## 2015-03-05 DIAGNOSIS — R110 Nausea: Secondary | ICD-10-CM | POA: Diagnosis not present

## 2015-03-20 ENCOUNTER — Other Ambulatory Visit: Payer: Self-pay | Admitting: Family

## 2015-03-21 NOTE — Telephone Encounter (Signed)
Pt's husband here requesting refill of zofran.

## 2015-04-01 DIAGNOSIS — F331 Major depressive disorder, recurrent, moderate: Secondary | ICD-10-CM | POA: Diagnosis not present

## 2015-04-01 MED FILL — clonazePAM 1 MG TABS: 1 | 30 days supply | Qty: 90 | Fill #0

## 2015-04-01 MED FILL — PARoxetine HCL 30 MG TABS: 30 | 30 days supply | Qty: 30 | Fill #0

## 2015-04-21 DIAGNOSIS — F331 Major depressive disorder, recurrent, moderate: Secondary | ICD-10-CM | POA: Diagnosis not present

## 2015-04-28 ENCOUNTER — Other Ambulatory Visit: Payer: Self-pay | Admitting: Family

## 2015-04-29 MED FILL — PARoxetine HCL 30 MG TABS: 30 | 30 days supply | Qty: 30 | Fill #1

## 2015-04-30 MED FILL — HYDROCODON-APAP 5-325: 5-325 | 5 days supply | Qty: 60 | Fill #0

## 2015-04-30 MED FILL — ONDANSETRON ODT 8 MG TABLET: 8 | 10 days supply | Qty: 30 | Fill #0

## 2015-05-01 MED FILL — GABAPENTIN 600 MG TABLET: 600 | 30 days supply | Qty: 180 | Fill #0

## 2015-05-15 ENCOUNTER — Other Ambulatory Visit: Payer: Self-pay | Admitting: Family

## 2015-05-15 MED FILL — clonazePAM 1 MG TABS: 1 | 30 days supply | Qty: 90 | Fill #0

## 2015-05-15 MED FILL — PARoxetine HCL 20 MG TABS: 20 | 30 days supply | Qty: 30 | Fill #0

## 2015-05-15 NOTE — Telephone Encounter (Signed)
Last OV 10/16/14 zofran last filled 04/15/15 #30 with 0

## 2015-05-16 MED FILL — ONDANSETRON ODT 8 MG TABLET: 8 | 10 days supply | Qty: 30 | Fill #0

## 2015-05-28 LAB — HM COLONOSCOPY

## 2015-05-29 MED FILL — ONDANSETRON ODT 8 MG TABLET: 8 | 10 days supply | Qty: 30 | Fill #1

## 2015-06-13 ENCOUNTER — Encounter: Payer: Self-pay | Admitting: Family

## 2015-06-13 ENCOUNTER — Ambulatory Visit (INDEPENDENT_AMBULATORY_CARE_PROVIDER_SITE_OTHER): Payer: 59 | Admitting: Family

## 2015-06-13 VITALS — BP 118/80 | HR 84 | Temp 97.6°F | Ht 62.0 in | Wt 155.0 lb

## 2015-06-13 DIAGNOSIS — Z1159 Encounter for screening for other viral diseases: Secondary | ICD-10-CM | POA: Diagnosis not present

## 2015-06-13 DIAGNOSIS — E669 Obesity, unspecified: Secondary | ICD-10-CM | POA: Diagnosis not present

## 2015-06-13 DIAGNOSIS — Z1231 Encounter for screening mammogram for malignant neoplasm of breast: Secondary | ICD-10-CM

## 2015-06-13 DIAGNOSIS — Z0000 Encounter for general adult medical examination without abnormal findings: Secondary | ICD-10-CM

## 2015-06-13 LAB — BASIC METABOLIC PANEL
BUN: 17 mg/dL (ref 6–23)
CHLORIDE: 104 meq/L (ref 96–112)
CO2: 26 meq/L (ref 19–32)
CREATININE: 0.96 mg/dL (ref 0.40–1.20)
Calcium: 9.4 mg/dL (ref 8.4–10.5)
GFR: 62.1 mL/min (ref >60.00)
GLUCOSE: 102 mg/dL — AB (ref 70–99)
Potassium: 3.6 mEq/L (ref 3.5–5.1)
Sodium: 141 mEq/L (ref 135–145)

## 2015-06-13 LAB — LIPID PANEL
Cholesterol: 203 mg/dL — ABNORMAL HIGH (ref 0–200)
HDL: 49.1 mg/dL (ref >39.00)
LDL Cholesterol: 116 mg/dL — ABNORMAL HIGH (ref 0–99)
NonHDL: 153.43
TRIGLYCERIDES: 189 mg/dL — AB (ref 0.0–149.0)
Total CHOL/HDL Ratio: 4
VLDL: 37.8 mg/dL (ref 0.0–40.0)

## 2015-06-13 LAB — HEPATITIS C ANTIBODY: HCV Ab: NEGATIVE

## 2015-06-13 MED FILL — ONDANSETRON ODT 8 MG TABLET: 8 | 10 days supply | Qty: 30 | Fill #2

## 2015-06-13 NOTE — Patient Instructions (Addendum)
Please complete lab work prior to leaving. Try to add walking daily as able. Call if cough and diarrhea worsen or if they do not improve in 3 days. Please follow up with psychiatry to discuss depression symptoms.

## 2015-06-13 NOTE — Progress Notes (Signed)
Subjective:    Patient ID: [name], female    DOB: 1951-03-03, 65 y.o.   MRN: [id number]  HPI Subjective:    [name] is a 65 y.o. female who presents for Medicare Annual/Subsequent preventive examination.  Preventive Screening-Counseling & Management  Tobacco History  Smoking status  . Former Smoker  . Quit date: 08/09/1986  Smokeless tobacco  . Never Used     Problems Prior to Visit 1. Anxiety/Depression- Dr. Marlan Palau and Dr Elane Fritz.  Reports stable on current meds.  2.  GERD- reports stable. Did not start prilosec.    3.  Migraines- stable.    4.  Allergic rhinitis-  Using allegra D  5.  Patient presents today for complete physical.  Immunizations: up to date Diet: reports diet could be better.  Does not eat enough.  Exercise: not active.  Colonoscopy: 2007 Dexa:declines Pap Smear:  due Mammogram: due  She does report that she has had a cough x 6 days, chronic nausea and today has had some diarrhea.   Current Problems (verified) Patient Active Problem List   Diagnosis Date Noted  . Allergic rhinitis 10/16/2014  . GERD (gastroesophageal reflux disease) 10/16/2014  . Reactive airway disease 09/11/2014  . Chronic nausea 12/25/2013  . Anxiety and depression 12/25/2013  . Arachnoiditis   . Umbilical hernia 0000000    Medications Prior to Visit Current Outpatient Prescriptions on File Prior to Visit  Medication Sig Dispense Refill  . clonazePAM (KLONOPIN) 0.5 MG tablet Take 0.5 mg by mouth 2 (two) times daily.    . fentaNYL (DURAGESIC - DOSED MCG/HR) 50 MCG/HR Place 75 mcg onto the skin every 3 (three) days. Every 3 days    . fexofenadine-pseudoephedrine (ALLEGRA-D ALLERGY & CONGESTION) 180-240 MG per 24 hr tablet Take 1 tablet by mouth daily as needed.    . gabapentin (NEURONTIN) 600 MG tablet Take 600 mg by mouth. X[5]Take 2 in the Am, 1 in Afternoon, and 2 in the PM    . ondansetron (ZOFRAN-ODT) 8 MG disintegrating tablet DISSOLVE 1 TABLET  BY MOUTH EVERY 8 HOURS AS NEEDED FOR NAUSEA OR VOMITING 30 tablet 2  . PARoxetine (PAXIL) 30 MG tablet Take 1 tablet (30 mg total) by mouth daily.     Current Facility-Administered Medications on File Prior to Visit  Medication Dose Route Frequency Provider Last Rate Last Dose  . ipratropium-albuterol (DUONEB) 0.5-2.5 (3) MG/3ML nebulizer solution 3 mL  3 mL Nebulization Q6H Brunetta Jeans, PA-C        Current Medications (verified) Current Outpatient Prescriptions  Medication Sig Dispense Refill  . clonazePAM (KLONOPIN) 0.5 MG tablet Take 0.5 mg by mouth 2 (two) times daily.    . fentaNYL (DURAGESIC - DOSED MCG/HR) 50 MCG/HR Place 75 mcg onto the skin every 3 (three) days. Every 3 days    . fexofenadine-pseudoephedrine (ALLEGRA-D ALLERGY & CONGESTION) 180-240 MG per 24 hr tablet Take 1 tablet by mouth daily as needed.    . gabapentin (NEURONTIN) 600 MG tablet Take 600 mg by mouth. X[5]Take 2 in the Am, 1 in Afternoon, and 2 in the PM    . ondansetron (ZOFRAN-ODT) 8 MG disintegrating tablet DISSOLVE 1 TABLET BY MOUTH EVERY 8 HOURS AS NEEDED FOR NAUSEA OR VOMITING 30 tablet 2  . PARoxetine (PAXIL) 30 MG tablet Take 1 tablet (30 mg total) by mouth daily.     Current Facility-Administered Medications  Medication Dose Route Frequency Provider Last Rate Last Dose  . ipratropium-albuterol (DUONEB) 0.5-2.5 (  3) MG/3ML nebulizer solution 3 mL  3 mL Nebulization Q6H Brunetta Jeans, PA-C         Allergies (verified) Compazine and Morphine and related   PAST HISTORY  Family History Family History  Problem Relation Age of Onset  . Alzheimer's disease Mother   . Hypertension Mother   . Migraines Mother   . Hypertension Father   . Other Father     ?carotid artery aneurysm  . Cancer Brother     mouth and throat    Social History Social History  Substance Use Topics  . Smoking status: Former Smoker    Quit date: 08/09/1986  . Smokeless tobacco: Never Used  . Alcohol Use: No      Are there smokers in your home (other than you)? Yes- daughter- smokes outside  Risk Factors Current exercise habits: none  Dietary issues discussed: balanced diet   Cardiac risk factors: advanced age (older than 5 for men, 33 for women), obesity (BMI >= 30 kg/m2) and sedentary lifestyle.  Depression Screen (Note: if answer to either of the following is "Yes", a more complete depression screening is indicated)   Over the past two weeks, have you felt down, depressed or hopeless? Yes mild depression- unchanged  Over the past two weeks, have you felt little interest or pleasure in doing things? Yes  Have you lost interest or pleasure in daily life? No  Do you often feel hopeless? No  Do you cry easily over simple problems? Yes- sometimes  Activities of Daily Living In your present state of health, do you have any difficulty performing the following activities?:  Driving? no Managing money?  No Feeding yourself? No Getting from bed to chair? No . Climbing a flight of stairs? No Preparing food and eating?: No Bathing or showering? No Getting dressed: No Getting to the toilet? No Using the toilet:No Moving around from place to place: No In the past year have you fallen or had a near fall?:No   Are you sexually active?  No  Do you have more than one partner?  No  Hearing Difficulties: No Do you often ask people to speak up or repeat themselves? No Do you experience ringing or noises in your ears? No Do you have difficulty understanding soft or whispered voices? No   Do you feel that you have a problem with memory? No  Do you often misplace items? No  Do you feel safe at home?  Yes  Cognitive Testing  Alert? Yes  Normal Appearance?Yes  Oriented to person? Yes  Place? Yes   Time? Yes  Recall of three objects?  Yes  Can perform simple calculations? Yes  Displays appropriate judgment?Yes  Can read the correct time from a watch face?Yes   Advanced Directives have been  discussed with the patient? Yes  List the Names of Other Physician/Practitioners you currently use: See care team  Indicate any recent Medical Services you may have received from other than [facility] providers in the past year (date may be approximate).  Immunization History  Administered Date(s) Administered  . Influenza,inj,Quad PF,36+ Mos 12/25/2013, 12/13/2014  . Pneumococcal Polysaccharide-23 12/13/2014  . Td 06/06/2008  . Zoster 01/15/2011    Screening Tests Health Maintenance  Topic Date Due  . Hepatitis C Screening  1951-01-30  . HIV Screening  12/27/1965  . PAP SMEAR  11/01/2014  . INFLUENZA VACCINE  10/28/2015  . COLONOSCOPY  11/20/2015  . MAMMOGRAM  12/18/2015  . TETANUS/TDAP  06/07/2018  .  ZOSTAVAX  Completed    All answers were reviewed with the patient and necessary referrals were made:  O'SULLIVAN,Samuele Storey S., NP   06/13/2015   History reviewed: allergies, current medications, past family history, past medical history, past social history, past surgical history and problem list  Review of Systems Pertinent items are noted in HPI.    Objective:      Body mass index is 28.34 kg/(m^2). BP 118/80 mmHg  Pulse 84  Temp(Src) 97.6 F (36.4 C) (Oral)  Ht 5\' 2"  (1.575 m)  Wt 155 lb (70.308 kg)  BMI 28.34 kg/m2  SpO2 98%  LMP 03/29/1993  Physical Exam  Constitutional: She is oriented to person, place, and time. She appears well-developed and well-nourished. No distress.  HENT:  Head: Normocephalic and atraumatic.  Right Ear: Tympanic membrane and ear canal normal.  Left Ear: Tympanic membrane and ear canal normal.  Mouth/Throat: Oropharynx is clear and moist.  Eyes: Pupils are equal, round, and reactive to light. No scleral icterus.  Neck: Normal range of motion. No thyromegaly present.  Cardiovascular: Normal rate and regular rhythm.   No murmur heard. Pulmonary/Chest: Effort normal and breath sounds normal. No respiratory distress. He has no wheezes. She  has no rales. She exhibits no tenderness.  Abdominal: Soft. Bowel sounds are normal. He exhibits no distension and no mass. There is no tenderness. There is no rebound and no guarding.  Musculoskeletal: She exhibits no edema.  Lymphadenopathy:    She has no cervical adenopathy.  Neurological: She is alert and oriented to person, place, and time. She has normal reflexes. She exhibits normal muscle tone. Coordination normal.  Skin: Skin is warm and dry.  Psychiatric: She has a normal mood and affect. Her behavior is normal. Judgment and thought content normal.  Breast/pelvic: deferred to GYN        Assessment & Plan:        Assessment:          Plan:     During the course of the visit the patient was educated and counseled about appropriate screening and preventive services including:    Screening mammography  Screening Pap smear and pelvic exam   Bone densitometry screening  Nutrition counseling   Advanced directives: pt has HCPOA- gave pt a copy of  MOST form to review and bring back to sign if she wishes   Diet review for nutrition referral? Yes ____  Not Indicated ____   Patient Instructions (the written plan) was given to the patient.  Medicare Attestation I have personally reviewed: The patient's medical and social history Their use of alcohol, tobacco or illicit drugs Their current medications and supplements The patient's functional ability including ADLs,fall risks, home safety risks, cognitive, and hearing and visual impairment Diet and physical activities Evidence for depression or mood disorders  The patient's weight, height, BMI, and visual acuity have been recorded in the chart.  I have made referrals, counseling, and provided education to the patient based on review of the above and I have provided the patient with a written personalized care plan for preventive services.     O'SULLIVAN,Sulema Braid S., NP   06/13/2015         Review of  Systems     Objective:   Physical Exam        Assessment & Plan:

## 2015-06-13 NOTE — Progress Notes (Signed)
Pre visit review using our clinic review tool, if applicable. No additional management support is needed unless otherwise documented below in the visit note. 

## 2015-06-15 ENCOUNTER — Encounter: Payer: Self-pay | Admitting: Family

## 2015-06-15 DIAGNOSIS — E781 Pure hyperglyceridemia: Secondary | ICD-10-CM | POA: Insufficient documentation

## 2015-06-19 DIAGNOSIS — M5441 Lumbago with sciatica, right side: Secondary | ICD-10-CM | POA: Diagnosis not present

## 2015-06-19 DIAGNOSIS — M5442 Lumbago with sciatica, left side: Secondary | ICD-10-CM | POA: Diagnosis not present

## 2015-06-19 DIAGNOSIS — G8929 Other chronic pain: Secondary | ICD-10-CM | POA: Diagnosis not present

## 2015-06-19 DIAGNOSIS — M5418 Radiculopathy, sacral and sacrococcygeal region: Secondary | ICD-10-CM | POA: Diagnosis not present

## 2015-06-19 DIAGNOSIS — M797 Fibromyalgia: Secondary | ICD-10-CM | POA: Diagnosis not present

## 2015-06-19 DIAGNOSIS — M2550 Pain in unspecified joint: Secondary | ICD-10-CM | POA: Diagnosis not present

## 2015-06-26 ENCOUNTER — Other Ambulatory Visit: Payer: Self-pay | Admitting: Family

## 2015-06-26 ENCOUNTER — Ambulatory Visit (HOSPITAL_BASED_OUTPATIENT_CLINIC_OR_DEPARTMENT_OTHER)
Admission: RE | Admit: 2015-06-26 | Discharge: 2015-06-26 | Disposition: A | Discharge status: Home / Self Care | Payer: 59 | Source: Ambulatory Visit | Attending: Family | Admitting: Family

## 2015-06-26 DIAGNOSIS — Z1231 Encounter for screening mammogram for malignant neoplasm of breast: Secondary | ICD-10-CM | POA: Diagnosis not present

## 2015-06-29 ENCOUNTER — Encounter: Payer: Self-pay | Admitting: Family

## 2015-07-02 MED FILL — PARoxetine HCL 20 MG TABS: 20 | 30 days supply | Qty: 30 | Fill #1

## 2015-07-02 MED FILL — ONDANSETRON ODT 8 MG TABLET: 8 | 10 days supply | Qty: 30 | Fill #0

## 2015-07-10 ENCOUNTER — Encounter: Payer: Self-pay | Admitting: Obstetrics & Gynecology

## 2015-07-16 MED FILL — clonazePAM 1 MG TABS: 1 | 30 days supply | Qty: 90 | Fill #1

## 2015-07-18 ENCOUNTER — Telehealth: Payer: Self-pay | Admitting: Family

## 2015-07-18 ENCOUNTER — Ambulatory Visit (INDEPENDENT_AMBULATORY_CARE_PROVIDER_SITE_OTHER): Payer: Medicare Other | Admitting: Family Medicine

## 2015-07-18 ENCOUNTER — Encounter: Payer: Self-pay | Admitting: Family Medicine

## 2015-07-18 ENCOUNTER — Ambulatory Visit: Payer: Medicare Other | Admitting: Family

## 2015-07-18 VITALS — BP 120/80 | HR 77 | Temp 98.3°F | Wt 148.2 lb

## 2015-07-18 DIAGNOSIS — R110 Nausea: Secondary | ICD-10-CM

## 2015-07-18 MED ORDER — METOCLOPRAMIDE HCL 10 MG PO TABS: 10.0000 mg | ORAL_TABLET | Freq: Three times a day (TID) | ORAL | Status: DC

## 2015-07-18 MED FILL — METOCLOPRAMIDE 10 MG TABLET: 10 | 30 days supply | Qty: 90 | Fill #0

## 2015-07-18 NOTE — Patient Instructions (Signed)
Nausea, Adult °Nausea is the feeling that you have an upset stomach or have to vomit. Nausea by itself is not likely a serious concern, but it may be an early sign of more serious medical problems. As nausea gets worse, it can lead to vomiting. If vomiting develops, there is the risk of dehydration.  °CAUSES  °· Viral infections. °· Food poisoning. °· Medicines. °· Pregnancy. °· Motion sickness. °· Migraine headaches. °· Emotional distress. °· Severe pain from any source. °· Alcohol intoxication. °HOME CARE INSTRUCTIONS °· Get plenty of rest. °· Ask your caregiver about specific rehydration instructions. °· Eat small amounts of food and sip liquids more often. °· Take all medicines as told by your caregiver. °SEEK MEDICAL CARE IF: °· You have not improved after 2 days, or you get worse. °· You have a headache. °SEEK IMMEDIATE MEDICAL CARE IF:  °· You have a fever. °· You faint. °· You keep vomiting or have blood in your vomit. °· You are extremely weak or dehydrated. °· You have dark or bloody stools. °· You have severe chest or abdominal pain. °MAKE SURE YOU: °· Understand these instructions. °· Will watch your condition. °· Will get help right away if you are not doing well or get worse. °  °This information is not intended to replace advice given to you by your health care provider. Make sure you discuss any questions you have with your health care provider. °  °Document Released: 04/22/2004 Document Revised: 04/05/2014 Document Reviewed: 11/25/2010 °Elsevier Interactive Patient Education ©2016 [employer]vier [employer] ° °

## 2015-07-18 NOTE — Progress Notes (Signed)
Pre visit review using our clinic review tool, if applicable. No additional management support is needed unless otherwise documented below in the visit note. 

## 2015-07-20 NOTE — Progress Notes (Signed)
Patient ID: [name], female    DOB: 08-31-1950  Age: 65 y.o. MRN: [id number]    Subjective:  Subjective HPI [name] presents with c/o nausea that has been going on for a long time.  She has seen GI here and at [facility] and was supposed to f/u with [facility] but has not-- she said she did not realize she was supposed to make an appointment.  Pt was told by [facility] that the fentanyl was possibly causing the nausea but she refuses to come off of it.   Review of Systems  Constitutional: Negative for diaphoresis, appetite change, fatigue and unexpected weight change.  Eyes: Negative for pain, redness and visual disturbance.  Respiratory: Negative for cough, chest tightness, shortness of breath and wheezing.   Cardiovascular: Negative for chest pain, palpitations and leg swelling.  Gastrointestinal: Positive for nausea.  Endocrine: Negative for cold intolerance, heat intolerance, polydipsia, polyphagia and polyuria.  Genitourinary: Negative for dysuria, frequency and difficulty urinating.  Neurological: Negative for dizziness, light-headedness, numbness and headaches.    History Past Medical History  Diagnosis Date  . GERD (gastroesophageal reflux disease)   . Arachnoiditis   . Environmental allergies   . Elevated liver enzymes   . Depression   . Migraine with typical aura     resolved years ago  . H/O being hospitalized 03/2012    x1 week for nausea  . Nausea     chronic nausea   . History of chicken pox   . Allergy     seasonal    She has past surgical history that includes Abdominoplasty (07/11/09); EUS (N/A, 05/05/2012); Laparoscopic cholecystectomy (2010); and Nasal sinus surgery.   Her family history includes Alzheimer's disease in her mother; Cancer in her brother; Hypertension in her father and mother; Migraines in her mother; Other in her father.She reports that she quit smoking about 28 years ago. She has never used smokeless tobacco. She reports that she does not drink  alcohol or use illicit drugs.  Current Outpatient Prescriptions on File Prior to Visit  Medication Sig Dispense Refill  . clonazePAM (KLONOPIN) 0.5 MG tablet Take 0.5 mg by mouth 2 (two) times daily.    . fentaNYL (DURAGESIC - DOSED MCG/HR) 50 MCG/HR Place 75 mcg onto the skin every 3 (three) days. Every 3 days    . fexofenadine-pseudoephedrine (ALLEGRA-D ALLERGY & CONGESTION) 180-240 MG per 24 hr tablet Take 1 tablet by mouth daily as needed.    . gabapentin (NEURONTIN) 600 MG tablet Take 600 mg by mouth. X[5]Take 2 in the Am, 1 in Afternoon, and 2 in the PM    . ondansetron (ZOFRAN-ODT) 8 MG disintegrating tablet DISSOLVE 1 TABLET BY MOUTH EVERY 8 HOURS AS NEEDED FOR NAUSEA OR VOMITING 30 tablet 2  . PARoxetine (PAXIL) 30 MG tablet Take 1 tablet (30 mg total) by mouth daily.     Current Facility-Administered Medications on File Prior to Visit  Medication Dose Route Frequency Provider Last Rate Last Dose  . ipratropium-albuterol (DUONEB) 0.5-2.5 (3) MG/3ML nebulizer solution 3 mL  3 mL Nebulization Q6H Brunetta Jeans, PA-C         Objective:  Objective Physical Exam  Constitutional: She is oriented to person, place, and time. She appears well-developed and well-nourished.  HENT:  Head: Normocephalic and atraumatic.  Eyes: Conjunctivae and EOM are normal.  Neck: Normal range of motion. Neck supple. No JVD present. Carotid bruit is not present. No thyromegaly present.  Cardiovascular: Normal rate, regular rhythm and normal  heart sounds.   No murmur heard. Pulmonary/Chest: Effort normal and breath sounds normal. No respiratory distress. She has no wheezes. She has no rales. She exhibits no tenderness.  Musculoskeletal: She exhibits no edema.  Neurological: She is alert and oriented to person, place, and time.  Psychiatric: She has a normal mood and affect.  Vitals reviewed.  BP 120/80 mmHg  Pulse 77  Temp(Src) 98.3 F (36.8 C) (Oral)  Wt 148 lb 3.2 oz (67.223 kg)  SpO2 98%  LMP  03/29/1993 Wt Readings from Last 3 Encounters:  07/18/15 148 lb 3.2 oz (67.223 kg)  06/13/15 155 lb (70.308 kg)  12/13/14 167 lb (75.751 kg)     Lab Results  Component Value Date   WBC 10.9* 11/19/2008   HGB 14.0 11/19/2008   HCT 39.9 11/19/2008   PLT 91* 11/19/2008   GLUCOSE 102* 06/13/2015   CHOL 203* 06/13/2015   TRIG 189.0* 06/13/2015   HDL 49.10 06/13/2015   LDLCALC 116* 06/13/2015   ALT 18 11/19/2008   AST 25 11/19/2008   NA 141 06/13/2015   K 3.6 06/13/2015   CL 104 06/13/2015   CREATININE 0.96 06/13/2015   BUN 17 06/13/2015   CO2 26 06/13/2015    Mm Screening Breast Tomo Bilateral  06/27/2015  CLINICAL DATA:  Screening. EXAM: 2D DIGITAL SCREENING BILATERAL MAMMOGRAM WITH CAD AND ADJUNCT TOMO COMPARISON:  None. ACR Breast Density Category a: The breast tissue is almost entirely fatty. FINDINGS: There are no findings suspicious for malignancy. Images were processed with CAD. IMPRESSION: No mammographic evidence of malignancy. A result letter of this screening mammogram will be mailed directly to the patient. RECOMMENDATION: Screening mammogram in one year. (Code:SM-B-01Y) BI-RADS CATEGORY  1: Negative. Electronically Signed   By: Curlene Dolphin M.D.   On: 06/27/2015 14:55     Assessment & Plan:  Plan I am having [name] start on metoCLOPramide. I am also having her maintain her gabapentin, clonazePAM, fentaNYL, PARoxetine, fexofenadine-pseudoephedrine, and ondansetron. We will continue to administer ipratropium-albuterol.  Meds ordered this encounter  Medications  . metoCLOPramide (REGLAN) 10 MG tablet    Sig: Take 1 tablet (10 mg total) by mouth 3 (three) times daily before meals.    Dispense:  90 tablet    Refill:  3    Problem List Items Addressed This Visit    None    Visit Diagnoses    Nausea in adult    -  Primary    Relevant Medications    metoCLOPramide (REGLAN) 10 MG tablet    Other Relevant Orders    Ambulatory referral to Gastroenterology       f/u with [facility] Cont other meds Call or rto prn  Follow-up: Return if symptoms worsen or fail to improve.  Ann Held, DO

## 2015-07-21 NOTE — Telephone Encounter (Signed)
I believe pt came in late 07/18/15 for 10:30am appt, she was seen 11:00am by Dr. Etter Sjogren, charge or no charge?

## 2015-07-21 NOTE — Telephone Encounter (Signed)
No charge. 

## 2015-07-22 DIAGNOSIS — R140 Abdominal distension (gaseous): Secondary | ICD-10-CM | POA: Diagnosis not present

## 2015-07-22 DIAGNOSIS — R194 Change in bowel habit: Secondary | ICD-10-CM | POA: Diagnosis not present

## 2015-07-22 DIAGNOSIS — Z87891 Personal history of nicotine dependence: Secondary | ICD-10-CM | POA: Diagnosis not present

## 2015-07-22 DIAGNOSIS — R112 Nausea with vomiting, unspecified: Secondary | ICD-10-CM | POA: Diagnosis not present

## 2015-07-22 DIAGNOSIS — R630 Anorexia: Secondary | ICD-10-CM | POA: Diagnosis not present

## 2015-07-22 DIAGNOSIS — Z86010 Personal history of colonic polyps: Secondary | ICD-10-CM | POA: Diagnosis not present

## 2015-07-22 MED FILL — MIRTAZAPINE 15 MG TABLET: 15 | 30 days supply | Qty: 30 | Fill #0

## 2015-07-25 MED FILL — ONDANSETRON ODT 8 MG TABLET: 8 | 10 days supply | Qty: 30 | Fill #1

## 2015-07-28 ENCOUNTER — Ambulatory Visit: Payer: Self-pay | Admitting: Obstetrics & Gynecology

## 2015-07-28 ENCOUNTER — Encounter: Payer: Self-pay | Admitting: Obstetrics & Gynecology

## 2015-08-01 MED FILL — PARoxetine HCL 20 MG TABS: 20 | 30 days supply | Qty: 30 | Fill #2

## 2015-08-07 DIAGNOSIS — K3189 Other diseases of stomach and duodenum: Secondary | ICD-10-CM | POA: Diagnosis not present

## 2015-08-07 DIAGNOSIS — Z87891 Personal history of nicotine dependence: Secondary | ICD-10-CM | POA: Diagnosis not present

## 2015-08-07 DIAGNOSIS — E781 Pure hyperglyceridemia: Secondary | ICD-10-CM | POA: Diagnosis not present

## 2015-08-07 DIAGNOSIS — R110 Nausea: Secondary | ICD-10-CM | POA: Diagnosis not present

## 2015-08-07 DIAGNOSIS — R197 Diarrhea, unspecified: Secondary | ICD-10-CM | POA: Diagnosis not present

## 2015-08-07 DIAGNOSIS — K5289 Other specified noninfective gastroenteritis and colitis: Secondary | ICD-10-CM | POA: Diagnosis not present

## 2015-08-07 DIAGNOSIS — K2950 Unspecified chronic gastritis without bleeding: Secondary | ICD-10-CM | POA: Diagnosis not present

## 2015-08-07 DIAGNOSIS — K2930 Chronic superficial gastritis without bleeding: Secondary | ICD-10-CM | POA: Diagnosis not present

## 2015-08-07 DIAGNOSIS — F329 Major depressive disorder, single episode, unspecified: Secondary | ICD-10-CM | POA: Diagnosis not present

## 2015-08-07 DIAGNOSIS — E669 Obesity, unspecified: Secondary | ICD-10-CM | POA: Diagnosis not present

## 2015-08-07 DIAGNOSIS — M797 Fibromyalgia: Secondary | ICD-10-CM | POA: Diagnosis not present

## 2015-08-07 DIAGNOSIS — K648 Other hemorrhoids: Secondary | ICD-10-CM | POA: Diagnosis not present

## 2015-08-07 DIAGNOSIS — F419 Anxiety disorder, unspecified: Secondary | ICD-10-CM | POA: Diagnosis not present

## 2015-08-07 MED FILL — SUCRALFATE 1 GM TABLET: 1 | 30 days supply | Qty: 120 | Fill #0

## 2015-08-11 MED FILL — ONDANSETRON ODT 8 MG TABLET: 8 | 10 days supply | Qty: 30 | Fill #2

## 2015-08-28 ENCOUNTER — Other Ambulatory Visit: Payer: Self-pay | Admitting: Family

## 2015-08-28 MED FILL — clonazePAM 1 MG TABS: 1 | 30 days supply | Qty: 90 | Fill #2

## 2015-08-29 MED FILL — ONDANSETRON ODT 8 MG TABLET: 8 | 10 days supply | Qty: 30 | Fill #0

## 2015-09-16 DIAGNOSIS — R112 Nausea with vomiting, unspecified: Secondary | ICD-10-CM | POA: Diagnosis not present

## 2015-09-16 DIAGNOSIS — R140 Abdominal distension (gaseous): Secondary | ICD-10-CM | POA: Diagnosis not present

## 2015-09-16 DIAGNOSIS — K5900 Constipation, unspecified: Secondary | ICD-10-CM | POA: Diagnosis not present

## 2015-09-16 DIAGNOSIS — R197 Diarrhea, unspecified: Secondary | ICD-10-CM | POA: Diagnosis not present

## 2015-09-16 DIAGNOSIS — R630 Anorexia: Secondary | ICD-10-CM | POA: Diagnosis not present

## 2015-09-16 DIAGNOSIS — R1013 Epigastric pain: Secondary | ICD-10-CM | POA: Diagnosis not present

## 2015-09-22 MED FILL — PARoxetine HCL 20 MG TABS: 20 | 30 days supply | Qty: 30 | Fill #3

## 2015-10-06 MED FILL — clonazePAM 1 MG TABS: 1 | 30 days supply | Qty: 90 | Fill #3

## 2015-10-21 DIAGNOSIS — Z79899 Other long term (current) drug therapy: Secondary | ICD-10-CM | POA: Diagnosis not present

## 2015-10-21 DIAGNOSIS — M797 Fibromyalgia: Secondary | ICD-10-CM | POA: Diagnosis not present

## 2015-10-21 DIAGNOSIS — G43009 Migraine without aura, not intractable, without status migrainosus: Secondary | ICD-10-CM | POA: Diagnosis not present

## 2015-10-21 DIAGNOSIS — M5442 Lumbago with sciatica, left side: Secondary | ICD-10-CM | POA: Diagnosis not present

## 2015-10-21 DIAGNOSIS — M5418 Radiculopathy, sacral and sacrococcygeal region: Secondary | ICD-10-CM | POA: Diagnosis not present

## 2015-10-21 DIAGNOSIS — G8929 Other chronic pain: Secondary | ICD-10-CM | POA: Diagnosis not present

## 2015-10-21 DIAGNOSIS — M5441 Lumbago with sciatica, right side: Secondary | ICD-10-CM | POA: Diagnosis not present

## 2015-10-22 DIAGNOSIS — K5900 Constipation, unspecified: Secondary | ICD-10-CM | POA: Diagnosis not present

## 2015-10-22 DIAGNOSIS — R110 Nausea: Secondary | ICD-10-CM | POA: Diagnosis not present

## 2015-10-22 DIAGNOSIS — K582 Mixed irritable bowel syndrome: Secondary | ICD-10-CM | POA: Diagnosis not present

## 2015-10-24 MED FILL — DICYCLOMINE 10 MG CAPSULE: 10 | 30 days supply | Qty: 120 | Fill #0

## 2015-10-27 ENCOUNTER — Encounter: Payer: Self-pay | Admitting: Family

## 2015-10-27 ENCOUNTER — Ambulatory Visit (INDEPENDENT_AMBULATORY_CARE_PROVIDER_SITE_OTHER): Payer: 59 | Admitting: Family

## 2015-10-27 DIAGNOSIS — F418 Other specified anxiety disorders: Secondary | ICD-10-CM

## 2015-10-27 DIAGNOSIS — F419 Anxiety disorder, unspecified: Principal | ICD-10-CM

## 2015-10-27 DIAGNOSIS — F32A Depression, unspecified: Secondary | ICD-10-CM

## 2015-10-27 DIAGNOSIS — F329 Major depressive disorder, single episode, unspecified: Secondary | ICD-10-CM

## 2015-10-27 DIAGNOSIS — F341 Dysthymic disorder: Secondary | ICD-10-CM

## 2015-10-27 MED ORDER — VENLAFAXINE HCL 37.5 MG PO TABS: ORAL_TABLET | ORAL | 0 refills | Status: DC

## 2015-10-27 MED FILL — VENLAFAXINE HCL 37.5 MG TAB: 37.5 | 31 days supply | Qty: 60 | Fill #0

## 2015-10-27 NOTE — Assessment & Plan Note (Signed)
Uncontrolled. Plan to continue paxil, add effexor. Continue klonopin prn. A controlled substance contract is signed today and will collect UDS.

## 2015-10-27 NOTE — Progress Notes (Signed)
Subjective:    Patient ID: Nahlia Cartwright, female    DOB: 1950-11-07, 64 y.o.   MRN: [id number]  HPI  Ms. Kowatch is a 65 yr old female who presents today for follow up of her depression.  She is currently maintained on paxil 30mg .   She used klonopin as needed.  Klonopin is currently being prescribed by Dr. Elane Fritz (psychiatry).  She tells me that she is no longer following with psychiatry because she did  Reports that she "stays home all the time," cries frequently, feels "fed up with my stomach." Feels that her daughter's don't understand what she goes through with her stomach. Wants to be able to enjoy life more.  Feels like she "needs to be alone so I won't upset anybody."  She has tried rexulti.  This did not help.  Does not need refill  She continues to see Dr. Lacinda Axon at [facility] who diagnosed her with IBS.    Review of Systems See HPI  Past Medical History:  Diagnosis Date  . Allergy    seasonal  . Arachnoiditis   . Depression   . Elevated liver enzymes   . Environmental allergies   . GERD (gastroesophageal reflux disease)   . H/O being hospitalized 03/2012   x1 week for nausea  . History of chicken pox   . Migraine with typical aura    resolved years ago  . Nausea    chronic nausea      Social History   Social History  . Marital status: Married    Spouse name: N/A  . Number of children: N/A  . Years of education: N/A   Occupational History  . Not on file.   Social History Main Topics  . Smoking status: Former Smoker    Quit date: 08/09/1986  . Smokeless tobacco: Never Used  . Alcohol use No  . Drug use: No  . Sexual activity: No     Comment: husband had vasectomy.    Other Topics Concern  . Not on file   Social History Narrative   2 step daughters- 25 and 42   She worked as an Web designer.   Enjoys puzzles, counted cross stitch.   Completed 1 year of college   4 cats    Past Surgical History:  Procedure Laterality Date  .  ABDOMINOPLASTY  07/11/09  . EUS N/A 05/05/2012   Procedure: UPPER ENDOSCOPIC ULTRASOUND (EUS) LINEAR;  Surgeon: Beryle Beams, MD;  Location: [facility];  Service: Endoscopy;  Laterality: N/A;  . LAPAROSCOPIC CHOLECYSTECTOMY  2010  . NASAL SINUS SURGERY     "Has had 2-3 surgeries over the years"    Family History  Problem Relation Age of Onset  . Alzheimer's disease Mother   . Hypertension Mother   . Migraines Mother   . Hypertension Father   . Other Father     ?carotid artery aneurysm  . Cancer Brother     mouth and throat    Allergies  Allergen Reactions  . Compazine [Prochlorperazine]     hyperactivity  . Morphine And Related Hives and Itching    Current Outpatient Prescriptions on File Prior to Visit  Medication Sig Dispense Refill  . clonazePAM (KLONOPIN) 0.5 MG tablet Take 0.5 mg by mouth 2 (two) times daily.    . fexofenadine-pseudoephedrine (ALLEGRA-D ALLERGY & CONGESTION) 180-240 MG per 24 hr tablet Take 1 tablet by mouth daily as needed.    . ondansetron (ZOFRAN-ODT) 8 MG disintegrating tablet DISSOLVE  1 TABLET BY MOUTH EVERY 8 HOURS AS NEEDED FOR NAUSEA OR VOMITING 30 tablet 2  . PARoxetine (PAXIL) 30 MG tablet Take 1 tablet (30 mg total) by mouth daily.     Current Facility-Administered Medications on File Prior to Visit  Medication Dose Route Frequency Provider Last Rate Last Dose  . ipratropium-albuterol (DUONEB) 0.5-2.5 (3) MG/3ML nebulizer solution 3 mL  3 mL Nebulization Q6H Brunetta Jeans, PA-C        BP 128/80   Pulse 70   Temp 98.3 F (36.8 C) (Oral)   Resp 18   Ht 5\' 2"  (1.575 m)   Wt 139 lb 3.2 oz (63.1 kg)   LMP 03/29/1993 Comment: 12/25/13 Pt states last cycle has been about 20 yrs ago.  SpO2 97% Comment: room air  BMI 25.46 kg/m       Objective:   Physical Exam  Constitutional: She appears well-developed and well-nourished. No distress.  Psychiatric: Judgment and thought content normal.  Anxious appearing, tearful            Assessment & Plan:

## 2015-10-27 NOTE — Patient Instructions (Addendum)
Continue Paxil and as needed Klonopin. Add effexor 1 tab once daily for 3 days then increase to 1 tab twice daily.

## 2015-10-27 NOTE — Progress Notes (Signed)
Pre visit review using our clinic review tool, if applicable. No additional management support is needed unless otherwise documented below in the visit note. 

## 2015-11-03 MED FILL — PARoxetine HCL 20 MG TABS: 20 | 30 days supply | Qty: 30 | Fill #4

## 2015-11-05 ENCOUNTER — Telehealth: Payer: Self-pay | Admitting: Family

## 2015-11-05 MED ORDER — PAROXETINE HCL 40 MG PO TABS: 40.0000 mg | ORAL_TABLET | ORAL | 2 refills | Status: DC

## 2015-11-05 MED FILL — PARoxetine HCL 40 MG TABS: 40 | 30 days supply | Qty: 30 | Fill #0

## 2015-11-05 NOTE — Telephone Encounter (Signed)
°  Relationship to patient: Self  Can be reached: [phone]    Reason for call: Patient is requesting to increase her dosage of venlafaxine (EFFEXOR) 37.5 MG tablet [id number] to 3 tablets per day instead of 2

## 2015-11-05 NOTE — Telephone Encounter (Signed)
Please continue effexor, increase paxil from 30mg  to 40mg .  Confirm no SI/HI ideation. Follow up as scheduled, sooner if symptoms do not improve.

## 2015-11-05 NOTE — Telephone Encounter (Signed)
Spoke with pt. States she still feels useless, down in the dumps and sad. Symptoms improved the first 2 days but have now returned. Has been taking for 1 1/2 weeks.  Please advise?

## 2015-11-05 NOTE — Telephone Encounter (Signed)
Pt denies SI/HI thoughts. Voices understanding of below instructions.

## 2015-11-07 ENCOUNTER — Telehealth: Payer: Self-pay | Admitting: Family

## 2015-11-07 MED ORDER — CLONAZEPAM 1 MG PO TABS: 1.0000 mg | ORAL_TABLET | Freq: Three times a day (TID) | ORAL | 0 refills | Status: DC | PRN

## 2015-11-07 MED ORDER — CLONAZEPAM 0.5 MG PO TABS: 0.5000 mg | ORAL_TABLET | Freq: Two times a day (BID) | ORAL | 1 refills | Status: DC

## 2015-11-07 MED FILL — clonazePAM 1 MG TABS: 1 | 30 days supply | Qty: 90 | Fill #0

## 2015-11-07 NOTE — Telephone Encounter (Signed)
Requesting refill on Clonazepam 1.0mg , please call into [place]. Patients contact number is [phone]

## 2015-11-07 NOTE — Telephone Encounter (Signed)
Melissa-- I don't see that we have prescribed this for pt before. Please advise

## 2015-11-07 NOTE — Telephone Encounter (Signed)
Spoke with pharmacy, they state that pt has been on 1mg  three times a day for > 1 yr. Spoke with PCP and received verbal auth to proceed with 1mg  three times daily, #90 x no refills. Rx called to pharmacy and notified pt.

## 2015-11-07 NOTE — Telephone Encounter (Signed)
See rx. 

## 2015-11-12 MED FILL — HYDROCODON-APAP 5-325: 5-325 | 30 days supply | Qty: 60 | Fill #0

## 2015-11-27 MED FILL — ONDANSETRON ODT 8 MG TABLET: 8 | 10 days supply | Qty: 30 | Fill #1

## 2015-11-28 ENCOUNTER — Encounter: Payer: Self-pay | Admitting: Family

## 2015-11-28 ENCOUNTER — Ambulatory Visit (INDEPENDENT_AMBULATORY_CARE_PROVIDER_SITE_OTHER): Payer: 59 | Admitting: Family

## 2015-11-28 DIAGNOSIS — F418 Other specified anxiety disorders: Secondary | ICD-10-CM | POA: Diagnosis not present

## 2015-11-28 DIAGNOSIS — F419 Anxiety disorder, unspecified: Principal | ICD-10-CM

## 2015-11-28 DIAGNOSIS — F32A Depression, unspecified: Secondary | ICD-10-CM

## 2015-11-28 DIAGNOSIS — F329 Major depressive disorder, single episode, unspecified: Secondary | ICD-10-CM

## 2015-11-28 MED ORDER — VENLAFAXINE HCL ER 75 MG PO CP24: 150.0000 mg | ORAL_CAPSULE | Freq: Every day | ORAL | 2 refills | Status: DC

## 2015-11-28 MED FILL — VENLAFAXINE HCL ER 75 MG CA: 75 | 30 days supply | Qty: 60 | Fill #0

## 2015-11-28 NOTE — Assessment & Plan Note (Signed)
Uncontrolled. Will increase effexor. Advised pt to continue current dose of paxil and schedule an appointment with a therapist. Continue prn klonopin.

## 2015-11-28 NOTE — Patient Instructions (Addendum)
Please change Effexor to Effexor ER 75mg  (take 2 tabs once daily).  Continue current dose of paxil.   Please contact the [place] and schedule an appointment with a therapist at our office. [phone]

## 2015-11-28 NOTE — Progress Notes (Signed)
Pre visit review using our clinic review tool, if applicable. No additional management support is needed unless otherwise documented below in the visit note. 

## 2015-11-28 NOTE — Progress Notes (Signed)
Subjective:    Patient ID: [name], female    DOB: 11/17/1950, 65 y.o.   MRN: [id number]  HPI  [name] is a 65 yr old female who presents today for follow up. Last visit (10/27/15) she noted worsening depression symptoms. She was continued on paxil and effexor was added to her regimen. She was continued on prn klonopin. She contacted [place] back on 11/05/15 and reported that her symptoms had improved for the first 2 days on effexor but that she was feeling back "down in the dumps." We then increased her paxil from 30-40mg  and continued her on effexor. Still feeling down.  Some good days (more than before though).  Denies Si/HI. Has had a "couple"  Panic attacks, but not that bad.    Review of Systems See HPI  Past Medical History:  Diagnosis Date  . Allergy    seasonal  . Arachnoiditis   . Depression   . Elevated liver enzymes   . Environmental allergies   . GERD (gastroesophageal reflux disease)   . H/O being hospitalized 03/2012   x1 week for nausea  . History of chicken pox   . Migraine with typical aura    resolved years ago  . Nausea    chronic nausea      Social History   Social History  . Marital status: Married    Spouse name: N/A  . Number of children: N/A  . Years of education: N/A   Occupational History  . Not on file.   Social History Main Topics  . Smoking status: Former Smoker    Quit date: 08/09/1986  . Smokeless tobacco: Never Used  . Alcohol use No  . Drug use: No  . Sexual activity: No     Comment: husband had vasectomy.    Other Topics Concern  . Not on file   Social History Narrative   2 step daughters- 53 and 1   She worked as an Web designer.   Enjoys puzzles, counted cross stitch.   Completed 1 year of college   4 cats    Past Surgical History:  Procedure Laterality Date  . ABDOMINOPLASTY  07/11/09  . EUS N/A 05/05/2012   Procedure: UPPER ENDOSCOPIC ULTRASOUND (EUS) LINEAR;  Surgeon: Beryle Beams, MD;  Location: [facility];  Service: Endoscopy;  Laterality: N/A;  . LAPAROSCOPIC CHOLECYSTECTOMY  2010  . NASAL SINUS SURGERY     "Has had 2-3 surgeries over the years"    Family History  Problem Relation Age of Onset  . Alzheimer's disease Mother   . Hypertension Mother   . Migraines Mother   . Hypertension Father   . Other Father     ?carotid artery aneurysm  . Cancer Brother     mouth and throat    Allergies  Allergen Reactions  . Compazine [Prochlorperazine]     hyperactivity  . Morphine And Related Hives and Itching    Current Outpatient Prescriptions on File Prior to Visit  Medication Sig Dispense Refill  . clonazePAM (KLONOPIN) 1 MG tablet Take 1 tablet (1 mg total) by mouth 3 (three) times daily as needed for anxiety. 90 tablet 0  . dicyclomine (BENTYL) 10 MG capsule Take 10 mg by mouth 4 (four) times daily as needed.    . fentaNYL (DURAGESIC - DOSED MCG/HR) 12 MCG/HR Place 1 patch onto the skin every 3 (three) days.    . fexofenadine-pseudoephedrine (ALLEGRA-D ALLERGY & CONGESTION) 180-240 MG per 24 hr tablet Take  1 tablet by mouth daily as needed.    . ondansetron (ZOFRAN-ODT) 8 MG disintegrating tablet DISSOLVE 1 TABLET BY MOUTH EVERY 8 HOURS AS NEEDED FOR NAUSEA OR VOMITING 30 tablet 2  . PARoxetine (PAXIL) 40 MG tablet Take 1 tablet (40 mg total) by mouth every morning. 30 tablet 2   Current Facility-Administered Medications on File Prior to Visit  Medication Dose Route Frequency Provider Last Rate Last Dose  . ipratropium-albuterol (DUONEB) 0.5-2.5 (3) MG/3ML nebulizer solution 3 mL  3 mL Nebulization Q6H Brunetta Jeans, PA-C        BP 114/74 (BP Location: Right Arm, Patient Position: Sitting, Cuff Size: Normal)   Pulse 74   Temp 98.3 F (36.8 C) (Oral)   Resp 16   Ht 5\' 2"  (1.575 m)   Wt 142 lb 12.8 oz (64.8 kg)   LMP 03/29/1993 Comment: 12/25/13 Pt states last cycle has been about 20 yrs ago.  SpO2 98%   BMI 26.12 kg/m       Objective:   Physical Exam    Constitutional: She appears well-developed and well-nourished.  Psychiatric: Her behavior is normal. Judgment and thought content normal.  Flat affect, mildly anxious appearing            Assessment & Plan:

## 2015-12-04 MED FILL — DICYCLOMINE 10 MG CAPSULE: 10 | 30 days supply | Qty: 120 | Fill #1

## 2015-12-04 MED FILL — PARoxetine HCL 40 MG TABS: 40 | 30 days supply | Qty: 30 | Fill #1

## 2015-12-17 ENCOUNTER — Telehealth: Payer: Self-pay | Admitting: *Deleted

## 2015-12-17 MED ORDER — CLONAZEPAM 1 MG PO TABS: 1.0000 mg | ORAL_TABLET | Freq: Three times a day (TID) | ORAL | 0 refills | Status: DC | PRN

## 2015-12-17 MED FILL — clonazePAM 1 MG TABS: 1 | 30 days supply | Qty: 90 | Fill #0

## 2015-12-17 NOTE — Telephone Encounter (Signed)
Received fax from pharmacy requesting clonazepam 1mg  three times daily.   Last Rf:  11/07/15 Last OV: 11/28/15 Next OV:  01/09/16 UDS: moderate, 10/27/15  Rx printed and forwarded to PCP for signature.

## 2015-12-17 NOTE — Telephone Encounter (Signed)
Rx faxed to pharmacy at 2:05pm.

## 2015-12-24 MED FILL — VENLAFAXINE HCL ER 75 MG CA: 75 | 30 days supply | Qty: 60 | Fill #1

## 2015-12-30 MED FILL — DICYCLOMINE 10 MG CAPSULE: 10 | 30 days supply | Qty: 120 | Fill #2

## 2015-12-30 MED FILL — PARoxetine HCL 40 MG TABS: 40 | 30 days supply | Qty: 30 | Fill #2

## 2016-01-01 DIAGNOSIS — K581 Irritable bowel syndrome with constipation: Secondary | ICD-10-CM | POA: Diagnosis not present

## 2016-01-01 DIAGNOSIS — Z79899 Other long term (current) drug therapy: Secondary | ICD-10-CM | POA: Diagnosis not present

## 2016-01-01 MED FILL — TRIMETHOBENZAMIDE 300 MG CA: 300 | 30 days supply | Qty: 90 | Fill #0

## 2016-01-09 ENCOUNTER — Ambulatory Visit (INDEPENDENT_AMBULATORY_CARE_PROVIDER_SITE_OTHER): Payer: 59 | Admitting: Family

## 2016-01-09 ENCOUNTER — Encounter: Payer: Self-pay | Admitting: Family

## 2016-01-09 DIAGNOSIS — F418 Other specified anxiety disorders: Secondary | ICD-10-CM | POA: Diagnosis not present

## 2016-01-09 DIAGNOSIS — F419 Anxiety disorder, unspecified: Principal | ICD-10-CM

## 2016-01-09 DIAGNOSIS — R110 Nausea: Secondary | ICD-10-CM | POA: Diagnosis not present

## 2016-01-09 DIAGNOSIS — Z23 Encounter for immunization: Secondary | ICD-10-CM | POA: Diagnosis not present

## 2016-01-09 DIAGNOSIS — F329 Major depressive disorder, single episode, unspecified: Secondary | ICD-10-CM

## 2016-01-09 DIAGNOSIS — F32A Depression, unspecified: Secondary | ICD-10-CM

## 2016-01-09 NOTE — Progress Notes (Addendum)
Subjective:    Patient ID: [name], female    DOB: November 14, 1950, 65 y.o.   MRN: [id number]  HPI  [name] is a 65 yr old female who presents today to discuss anxiety and depression. She notes no help with effexor so she stopped it.  She has not taken effexor all week. Notes that her mood has been "bad this week."  Has been staying in the house and not getting out much.  She has an appointment with Dr. Kelton Pillar (psychiatry) to establish care. Has trouble staying asleep.  Using zquil to help.    Chronic nausea- ongoing. Following with GI for this.  Still on fentanyl. She is very frustrated with her nausea, "I can't take this anymore."   Review of Systems    see HPI  Past Medical History:  Diagnosis Date  . Allergy    seasonal  . Arachnoiditis   . Depression   . Elevated liver enzymes   . Environmental allergies   . GERD (gastroesophageal reflux disease)   . H/O being hospitalized 03/2012   x1 week for nausea  . History of chicken pox   . Migraine with typical aura    resolved years ago  . Nausea    chronic nausea      Social History   Social History  . Marital status: Married    Spouse name: N/A  . Number of children: N/A  . Years of education: N/A   Occupational History  . Not on file.   Social History Main Topics  . Smoking status: Former Smoker    Quit date: 08/09/1986  . Smokeless tobacco: Never Used  . Alcohol use No  . Drug use: No  . Sexual activity: No     Comment: husband had vasectomy.    Other Topics Concern  . Not on file   Social History Narrative   2 step daughters- 58 and 17   She worked as an Web designer.   Enjoys puzzles, counted cross stitch.   Completed 1 year of college   4 cats    Past Surgical History:  Procedure Laterality Date  . ABDOMINOPLASTY  07/11/09  . EUS N/A 05/05/2012   Procedure: UPPER ENDOSCOPIC ULTRASOUND (EUS) LINEAR;  Surgeon: Beryle Beams, MD;  Location: [facility];  Service: Endoscopy;  Laterality:  N/A;  . LAPAROSCOPIC CHOLECYSTECTOMY  2010  . NASAL SINUS SURGERY     "Has had 2-3 surgeries over the years"    Family History  Problem Relation Age of Onset  . Alzheimer's disease Mother   . Hypertension Mother   . Migraines Mother   . Hypertension Father   . Other Father     ?carotid artery aneurysm  . Cancer Brother     mouth and throat    Allergies  Allergen Reactions  . Compazine [Prochlorperazine]     hyperactivity  . Morphine And Related Hives and Itching    Current Outpatient Prescriptions on File Prior to Visit  Medication Sig Dispense Refill  . clonazePAM (KLONOPIN) 1 MG tablet Take 1 tablet (1 mg total) by mouth 3 (three) times daily as needed for anxiety. 90 tablet 0  . fentaNYL (DURAGESIC - DOSED MCG/HR) 12 MCG/HR Place 1 patch onto the skin every 3 (three) days.    . fexofenadine-pseudoephedrine (ALLEGRA-D ALLERGY & CONGESTION) 180-240 MG per 24 hr tablet Take 1 tablet by mouth daily as needed.    Marland Kitchen HYDROcodone-acetaminophen (NORCO/VICODIN) 5-325 MG tablet Take 5-325 tablets by mouth as  needed.    . ondansetron (ZOFRAN-ODT) 8 MG disintegrating tablet DISSOLVE 1 TABLET BY MOUTH EVERY 8 HOURS AS NEEDED FOR NAUSEA OR VOMITING 30 tablet 2  . PARoxetine (PAXIL) 40 MG tablet Take 1 tablet (40 mg total) by mouth every morning. 30 tablet 2  . venlafaxine XR (EFFEXOR-XR) 75 MG 24 hr capsule Take 2 capsules (150 mg total) by mouth daily with breakfast. 60 capsule 2   Current Facility-Administered Medications on File Prior to Visit  Medication Dose Route Frequency Provider Last Rate Last Dose  . ipratropium-albuterol (DUONEB) 0.5-2.5 (3) MG/3ML nebulizer solution 3 mL  3 mL Nebulization Q6H Brunetta Jeans, PA-C        BP 133/79 (BP Location: Right Arm, Cuff Size: Normal)   Pulse 73   Temp 98.4 F (36.9 C) (Oral)   Resp 16   Ht 5\' 2"  (1.575 m)   Wt 142 lb 6.4 oz (64.6 kg)   LMP 03/29/1993 Comment: 12/25/13 Pt states last cycle has been about 20 yrs ago.  SpO2 98%  Comment: room air  BMI 26.05 kg/m    Objective:   Physical Exam  Constitutional: She is oriented to person, place, and time. She appears well-developed and well-nourished.  Cardiovascular: Normal rate, regular rhythm and normal heart sounds.   No murmur heard. Pulmonary/Chest: Effort normal and breath sounds normal. No respiratory distress. She has no wheezes.  Abdominal: She exhibits no distension. There is no tenderness. There is no rebound and no guarding.  Neurological: She is alert and oriented to person, place, and time.  Psychiatric: Her behavior is normal. Judgment and thought content normal.  Flat affect          Assessment & Plan:

## 2016-01-09 NOTE — Assessment & Plan Note (Signed)
Uncontrolled. Restart effexor, advised pt to keep her appointment with psychiatry to establish.

## 2016-01-09 NOTE — Progress Notes (Signed)
Pre visit review using our clinic review tool, if applicable. No additional management support is needed unless otherwise documented below in the visit note. 

## 2016-01-09 NOTE — Patient Instructions (Signed)
Restart effexor.   Keep your upcoming appointment with psychiatry. Please contact your pain management specialist to discuss trial off of fentanyl to see it this helps with your nausea.

## 2016-01-09 NOTE — Assessment & Plan Note (Signed)
I advised to her to touch base with her pain doctor about trial off of duragesic to see if this helps her nausea.

## 2016-01-13 ENCOUNTER — Telehealth: Payer: Self-pay | Admitting: *Deleted

## 2016-01-13 MED ORDER — CLONAZEPAM 1 MG PO TABS: 1.0000 mg | ORAL_TABLET | Freq: Three times a day (TID) | ORAL | 0 refills | Status: DC | PRN

## 2016-01-13 NOTE — Telephone Encounter (Signed)
Received fax from [facility] requesting clonazepam.  Last RX:  12/17/15, #90 UDS: moderate, 10/27/15. Last OV: 01/09/16 Next OV: 07/09/16.  Rx printed and forwarded to PCP for signature.

## 2016-01-14 MED FILL — clonazePAM 1 MG TABS: 1 | 30 days supply | Qty: 90 | Fill #0

## 2016-01-14 NOTE — Telephone Encounter (Signed)
Rx faxed to pharmacy at 9:40am. 

## 2016-01-15 ENCOUNTER — Ambulatory Visit (INDEPENDENT_AMBULATORY_CARE_PROVIDER_SITE_OTHER): Payer: Medicare Other | Admitting: Licensed Clinical Social Worker

## 2016-01-15 DIAGNOSIS — F332 Major depressive disorder, recurrent severe without psychotic features: Secondary | ICD-10-CM

## 2016-01-20 MED FILL — ONDANSETRON ODT 8 MG TABLET: 8 | 10 days supply | Qty: 30 | Fill #2

## 2016-01-29 ENCOUNTER — Ambulatory Visit (INDEPENDENT_AMBULATORY_CARE_PROVIDER_SITE_OTHER): Payer: Medicare Other | Admitting: Licensed Clinical Social Worker

## 2016-01-29 DIAGNOSIS — F332 Major depressive disorder, recurrent severe without psychotic features: Secondary | ICD-10-CM

## 2016-02-02 ENCOUNTER — Other Ambulatory Visit: Payer: Self-pay | Admitting: Family

## 2016-02-02 MED FILL — PARoxetine HCL 40 MG TABS: 40 | 30 days supply | Qty: 30 | Fill #0

## 2016-02-02 MED FILL — VENLAFAXINE HCL ER 75 MG CA: 75 | 30 days supply | Qty: 60 | Fill #2

## 2016-02-02 NOTE — Telephone Encounter (Signed)
Medication filled to pharmacy as requested.   

## 2016-02-11 ENCOUNTER — Telehealth: Payer: Self-pay | Admitting: *Deleted

## 2016-02-11 MED ORDER — CLONAZEPAM 1 MG PO TABS: 1.0000 mg | ORAL_TABLET | Freq: Three times a day (TID) | ORAL | 0 refills | Status: DC | PRN

## 2016-02-11 MED FILL — clonazePAM 1 MG TABS: 1 | 30 days supply | Qty: 90 | Fill #0

## 2016-02-11 NOTE — Telephone Encounter (Signed)
Rx faxed to pharmacy  

## 2016-02-11 NOTE — Telephone Encounter (Signed)
Last CLonazepam Rx: 01/13/16, #90 Last OV: 01/09/16 Next OV: 06/2016 UDS:  Moderate risk, 10/27/15  Rx printed and forwarded to PCP for signature.

## 2016-02-12 ENCOUNTER — Other Ambulatory Visit: Payer: Self-pay | Admitting: Family

## 2016-02-12 ENCOUNTER — Ambulatory Visit: Payer: Medicare Other | Admitting: Licensed Clinical Social Worker

## 2016-02-12 MED FILL — ONDANSETRON ODT 8 MG TABLET: 8 | 10 days supply | Qty: 30 | Fill #0

## 2016-02-23 DIAGNOSIS — M5442 Lumbago with sciatica, left side: Secondary | ICD-10-CM | POA: Diagnosis not present

## 2016-02-23 DIAGNOSIS — M797 Fibromyalgia: Secondary | ICD-10-CM | POA: Diagnosis not present

## 2016-02-23 DIAGNOSIS — M5418 Radiculopathy, sacral and sacrococcygeal region: Secondary | ICD-10-CM | POA: Diagnosis not present

## 2016-02-23 DIAGNOSIS — G8929 Other chronic pain: Secondary | ICD-10-CM | POA: Diagnosis not present

## 2016-02-23 DIAGNOSIS — M5441 Lumbago with sciatica, right side: Secondary | ICD-10-CM | POA: Diagnosis not present

## 2016-02-23 DIAGNOSIS — G43009 Migraine without aura, not intractable, without status migrainosus: Secondary | ICD-10-CM | POA: Diagnosis not present

## 2016-03-03 ENCOUNTER — Other Ambulatory Visit: Payer: Self-pay | Admitting: Family

## 2016-03-03 MED FILL — VENLAFAXINE HCL ER 75 MG CA: 75 | 30 days supply | Qty: 60 | Fill #0

## 2016-03-03 MED FILL — PARoxetine HCL 40 MG TABS: 40 | 30 days supply | Qty: 30 | Fill #1

## 2016-03-03 NOTE — Telephone Encounter (Signed)
Refill sent per [facility] refill protocol/SLS  

## 2016-03-04 ENCOUNTER — Ambulatory Visit (INDEPENDENT_AMBULATORY_CARE_PROVIDER_SITE_OTHER): Payer: Medicare Other | Admitting: Licensed Clinical Social Worker

## 2016-03-04 DIAGNOSIS — F332 Major depressive disorder, recurrent severe without psychotic features: Secondary | ICD-10-CM

## 2016-03-11 ENCOUNTER — Ambulatory Visit: Payer: Medicare Other | Admitting: Licensed Clinical Social Worker

## 2016-03-15 MED FILL — ONDANSETRON ODT 8 MG TABLET: 8 | 10 days supply | Qty: 30 | Fill #1

## 2016-03-16 ENCOUNTER — Telehealth: Payer: Self-pay | Admitting: *Deleted

## 2016-03-16 MED ORDER — CLONAZEPAM 1 MG PO TABS: 1.0000 mg | ORAL_TABLET | Freq: Three times a day (TID) | ORAL | 0 refills | Status: DC | PRN

## 2016-03-16 NOTE — Telephone Encounter (Signed)
Received fax from [facility] requesting refill of clonazepam 1mg , #90.  Last RF: 02/11/16, #90 Last OV: 01/09/16 Next OV: 07/09/16 UDS: 10/27/15, due now  Rx printed and forwarded to PCP for signature. Pt will need to pick up Rx and provide UDS.

## 2016-03-16 NOTE — Telephone Encounter (Signed)
Rx placed at front desk for pick up and pt has been notified. She will pick up Rx tomorrow.

## 2016-03-18 ENCOUNTER — Ambulatory Visit (INDEPENDENT_AMBULATORY_CARE_PROVIDER_SITE_OTHER): Payer: Medicare Other | Admitting: Licensed Clinical Social Worker

## 2016-03-18 DIAGNOSIS — F3341 Major depressive disorder, recurrent, in partial remission: Secondary | ICD-10-CM | POA: Diagnosis not present

## 2016-03-19 ENCOUNTER — Encounter: Payer: Self-pay | Admitting: Family

## 2016-03-19 ENCOUNTER — Ambulatory Visit (INDEPENDENT_AMBULATORY_CARE_PROVIDER_SITE_OTHER): Payer: 59 | Admitting: Family

## 2016-03-19 VITALS — BP 133/79 | HR 88 | Temp 98.4°F | Resp 16 | Ht 62.0 in | Wt 146.6 lb

## 2016-03-19 DIAGNOSIS — F329 Major depressive disorder, single episode, unspecified: Secondary | ICD-10-CM | POA: Diagnosis not present

## 2016-03-19 DIAGNOSIS — Z79899 Other long term (current) drug therapy: Secondary | ICD-10-CM | POA: Diagnosis not present

## 2016-03-19 DIAGNOSIS — F32A Depression, unspecified: Secondary | ICD-10-CM

## 2016-03-19 MED ORDER — VENLAFAXINE HCL 100 MG PO TABS: 100.0000 mg | ORAL_TABLET | Freq: Two times a day (BID) | ORAL | 2 refills | Status: DC

## 2016-03-19 MED FILL — clonazePAM 1 MG TABS: 1 | 30 days supply | Qty: 90 | Fill #0

## 2016-03-19 MED FILL — VENLAFAXINE HCL 100 MG TAB: 100 | 30 days supply | Qty: 60 | Fill #0

## 2016-03-19 NOTE — Patient Instructions (Signed)
Change effexor to 100mg  twice daily.  Call if depression symptoms worsen or if they do not improve.

## 2016-03-19 NOTE — Progress Notes (Signed)
Pre visit review using our clinic review tool, if applicable. No additional management support is needed unless otherwise documented below in the visit note. 

## 2016-03-19 NOTE — Progress Notes (Signed)
Subjective:    Patient ID: [name], female    DOB: [birth year], 65 y.o.   MRN: [id number]  HPI   [name] is a 65 yr old female who presents today to discuss depression. She reports good compliance with her effexor and paxil.  Notes feeling sad and daily tearfulness.  Reports that she was really close to her mother who "passed a long time ago."  Really missing her this time of the year, "I just miss my friend, there is a void."     Review of Systems See HPI  Past Medical History:  Diagnosis Date  . Allergy    seasonal  . Arachnoiditis   . Depression   . Elevated liver enzymes   . Environmental allergies   . GERD (gastroesophageal reflux disease)   . H/O being hospitalized 03/2012   x1 week for nausea  . History of chicken pox   . Migraine with typical aura    resolved years ago  . Nausea    chronic nausea      Social History   Social History  . Marital status: Married    Spouse name: N/A  . Number of children: N/A  . Years of education: N/A   Occupational History  . Not on file.   Social History Main Topics  . Smoking status: Former Smoker    Quit date: 08/09/1986  . Smokeless tobacco: Never Used  . Alcohol use No  . Drug use: No  . Sexual activity: No     Comment: husband had vasectomy.    Other Topics Concern  . Not on file   Social History Narrative   2 step daughters- 65 and 52   She worked as an Web designer.   Enjoys puzzles, counted cross stitch.   Completed 1 year of college   4 cats    Past Surgical History:  Procedure Laterality Date  . ABDOMINOPLASTY  07/11/09  . EUS N/A 05/05/2012   Procedure: UPPER ENDOSCOPIC ULTRASOUND (EUS) LINEAR;  Surgeon: Beryle Beams, MD;  Location: [facility];  Service: Endoscopy;  Laterality: N/A;  . LAPAROSCOPIC CHOLECYSTECTOMY  2010  . NASAL SINUS SURGERY     "Has had 2-3 surgeries over the years"    Family History  Problem Relation Age of Onset  . Alzheimer's disease Mother   .  Hypertension Mother   . Migraines Mother   . Hypertension Father   . Other Father     ?carotid artery aneurysm  . Cancer Brother     mouth and throat    Allergies  Allergen Reactions  . Compazine [Prochlorperazine]     hyperactivity  . Morphine And Related Hives and Itching    Current Outpatient Prescriptions on File Prior to Visit  Medication Sig Dispense Refill  . clonazePAM (KLONOPIN) 1 MG tablet Take 1 tablet (1 mg total) by mouth 3 (three) times daily as needed for anxiety. 90 tablet 0  . fentaNYL (DURAGESIC - DOSED MCG/HR) 12 MCG/HR Place 1 patch onto the skin every 3 (three) days.    . fexofenadine-pseudoephedrine (ALLEGRA-D ALLERGY & CONGESTION) 180-240 MG per 24 hr tablet Take 1 tablet by mouth daily as needed.    Marland Kitchen HYDROcodone-acetaminophen (NORCO/VICODIN) 5-325 MG tablet Take 5-325 tablets by mouth as needed.    . ondansetron (ZOFRAN-ODT) 8 MG disintegrating tablet DISSOLVE 1 TABLET BY MOUTH EVERY 8 HOURS AS NEEDED FOR NAUSEA OR VOMITING 30 tablet 2  . PARoxetine (PAXIL) 40 MG tablet TAKE 1 TABLET (40  MG TOTAL) BY MOUTH EVERY MORNING. 30 tablet 2  . trimethobenzamide (TIGAN) 300 MG capsule Take 300 mg by mouth 4 (four) times daily.     Current Facility-Administered Medications on File Prior to Visit  Medication Dose Route Frequency Provider Last Rate Last Dose  . ipratropium-albuterol (DUONEB) 0.5-2.5 (3) MG/3ML nebulizer solution 3 mL  3 mL Nebulization Q6H Brunetta Jeans, PA-C        BP 133/79 (BP Location: Right Arm, Cuff Size: Normal)   Pulse 88   Temp 98.4 F (36.9 C) (Oral)   Resp 16   Ht 5\' 2"  (1.575 m)   Wt 146 lb 9.6 oz (66.5 kg)   LMP 03/29/1993 Comment: 12/25/13 Pt states last cycle has been about 20 yrs ago.  SpO2 (!) 88%   BMI 26.81 kg/m       Objective:   Physical Exam  Constitutional: She is oriented to person, place, and time. She appears well-developed and well-nourished. No distress.  Neurological: She is alert and oriented to person,  place, and time.  Psychiatric: Her behavior is normal. Judgment and thought content normal.  Mild tearfulness upon discussion of her mother.           Assessment & Plan:  Depression- deteriorated- will increase effexor from 150mg  xr daily to 100mg  bid. She is advised to call if symptoms worsen or if they do not improve.  I think that getting through the holidays will help her as well.

## 2016-03-31 ENCOUNTER — Telehealth: Payer: Self-pay | Admitting: Family

## 2016-03-31 MED ORDER — PAROXETINE HCL 40 MG PO TABS: 40.0000 mg | ORAL_TABLET | ORAL | 1 refills | Status: DC

## 2016-03-31 NOTE — Telephone Encounter (Signed)
Refill sent.

## 2016-03-31 NOTE — Telephone Encounter (Signed)
Self. Refill request for PARoxetine - 90 day supply.    Pharmacy: [facility]

## 2016-04-01 ENCOUNTER — Ambulatory Visit: Payer: Medicare Other | Admitting: Licensed Clinical Social Worker

## 2016-04-09 ENCOUNTER — Telehealth: Payer: Self-pay | Admitting: Family

## 2016-04-09 MED ORDER — ONDANSETRON 8 MG PO TBDP: ORAL_TABLET | ORAL | 1 refills | Status: DC

## 2016-04-09 NOTE — Telephone Encounter (Signed)
Melissa please advise what quantity would be sufficient for 90 day supply?

## 2016-04-09 NOTE — Telephone Encounter (Signed)
Patient is request a refill of ondansetron (ZOFRAN-ODT) 8 MG disintegrating tablet  90 day supply. Please advise  Pharmacy: [place], [place]

## 2016-04-09 NOTE — Telephone Encounter (Signed)
Lets give #90 please.

## 2016-04-09 NOTE — Telephone Encounter (Signed)
Rx sent for #90, notified pt.

## 2016-04-12 ENCOUNTER — Other Ambulatory Visit: Payer: Self-pay | Admitting: Family

## 2016-04-19 ENCOUNTER — Telehealth: Payer: Self-pay | Admitting: *Deleted

## 2016-04-19 MED ORDER — CLONAZEPAM 1 MG PO TABS: 1.0000 mg | ORAL_TABLET | Freq: Three times a day (TID) | ORAL | 0 refills | Status: DC | PRN

## 2016-04-19 MED FILL — clonazePAM 1 MG TABS: 1 | 30 days supply | Qty: 90 | Fill #0

## 2016-04-19 NOTE — Telephone Encounter (Signed)
Received request from [facility] for clonazepam:  Last RF: 03/16/17, #90 Last OV:  03/19/16 Next OV:  04/30/16 UDS: 03/19/16 and due 05/2016  Rx called to [facility] at [facility].

## 2016-04-22 MED FILL — VENLAFAXINE HCL 100 MG TAB: 100 | 30 days supply | Qty: 60 | Fill #1

## 2016-04-29 ENCOUNTER — Ambulatory Visit (INDEPENDENT_AMBULATORY_CARE_PROVIDER_SITE_OTHER): Payer: Medicare Other | Admitting: Licensed Clinical Social Worker

## 2016-04-29 DIAGNOSIS — F3341 Major depressive disorder, recurrent, in partial remission: Secondary | ICD-10-CM

## 2016-04-30 ENCOUNTER — Ambulatory Visit: Payer: Medicare Other | Admitting: Family

## 2016-04-30 ENCOUNTER — Telehealth: Payer: Self-pay | Admitting: Family

## 2016-04-30 NOTE — Telephone Encounter (Signed)
Pt lvm at 7:39 to cancel her appt. She said that she's not feeling well and need to stay in bed. Pt's appt is scheduled for this morning at 10:45. Pt says that she will call back in later to reschedule appt at her convenience.

## 2016-04-30 NOTE — Telephone Encounter (Signed)
Ok, no charge please.  

## 2016-05-05 ENCOUNTER — Encounter: Payer: Self-pay | Admitting: Family

## 2016-05-05 ENCOUNTER — Ambulatory Visit (INDEPENDENT_AMBULATORY_CARE_PROVIDER_SITE_OTHER): Payer: 59 | Admitting: Family

## 2016-05-05 DIAGNOSIS — F32A Depression, unspecified: Secondary | ICD-10-CM

## 2016-05-05 DIAGNOSIS — F419 Anxiety disorder, unspecified: Principal | ICD-10-CM

## 2016-05-05 DIAGNOSIS — F418 Other specified anxiety disorders: Secondary | ICD-10-CM

## 2016-05-05 DIAGNOSIS — F329 Major depressive disorder, single episode, unspecified: Secondary | ICD-10-CM

## 2016-05-05 MED ORDER — VENLAFAXINE HCL 100 MG PO TABS: 100.0000 mg | ORAL_TABLET | Freq: Two times a day (BID) | ORAL | 0 refills | Status: DC

## 2016-05-05 NOTE — Patient Instructions (Signed)
Please continue current dose of effexor.  

## 2016-05-05 NOTE — Progress Notes (Signed)
Pre visit review using our clinic review tool, if applicable. No additional management support is needed unless otherwise documented below in the visit note. 

## 2016-05-05 NOTE — Assessment & Plan Note (Signed)
Depression is improved. Continue current dose of effexor.

## 2016-05-05 NOTE — Progress Notes (Signed)
Subjective:    Patient ID: [name], female    DOB: [birth year], 66 y.o.   MRN: [id number]  HPI  [name] is a 66 yr old female who presents today for follow up of her depression.  Last visit she reported worsening of her depression symptoms following the loss of her mother.  We increased her effexor from 150mg  XR daily to 100mg  bid.  She reports that her mood is better since we increased her effexor.  She notes that she has some stress with her adult daughters but she and her husband are working through that.   Review of Systems    see HPI  Past Medical History:  Diagnosis Date  . Allergy    seasonal  . Arachnoiditis   . Depression   . Elevated liver enzymes   . Environmental allergies   . GERD (gastroesophageal reflux disease)   . H/O being hospitalized 03/2012   x1 week for nausea  . History of chicken pox   . Migraine with typical aura    resolved years ago  . Nausea    chronic nausea      Social History   Social History  . Marital status: Married    Spouse name: N/A  . Number of children: N/A  . Years of education: N/A   Occupational History  . Not on file.   Social History Main Topics  . Smoking status: Former Smoker    Quit date: 08/09/1986  . Smokeless tobacco: Never Used  . Alcohol use No  . Drug use: No  . Sexual activity: No     Comment: husband had vasectomy.    Other Topics Concern  . Not on file   Social History Narrative   2 step daughters- 29 and 78   She worked as an Web designer.   Enjoys puzzles, counted cross stitch.   Completed 1 year of college   4 cats    Past Surgical History:  Procedure Laterality Date  . ABDOMINOPLASTY  07/11/09  . EUS N/A 05/05/2012   Procedure: UPPER ENDOSCOPIC ULTRASOUND (EUS) LINEAR;  Surgeon: Beryle Beams, MD;  Location: [facility];  Service: Endoscopy;  Laterality: N/A;  . LAPAROSCOPIC CHOLECYSTECTOMY  2010  . NASAL SINUS SURGERY     "Has had 2-3 surgeries over the years"    Family  History  Problem Relation Age of Onset  . Alzheimer's disease Mother   . Hypertension Mother   . Migraines Mother   . Hypertension Father   . Other Father     ?carotid artery aneurysm  . Cancer Brother     mouth and throat    Allergies  Allergen Reactions  . Compazine [Prochlorperazine]     hyperactivity  . Morphine And Related Hives and Itching    Current Outpatient Prescriptions on File Prior to Visit  Medication Sig Dispense Refill  . clonazePAM (KLONOPIN) 1 MG tablet Take 1 tablet (1 mg total) by mouth 3 (three) times daily as needed for anxiety. 90 tablet 0  . fentaNYL (DURAGESIC - DOSED MCG/HR) 12 MCG/HR Place 1 patch onto the skin every 3 (three) days.    . fexofenadine-pseudoephedrine (ALLEGRA-D ALLERGY & CONGESTION) 180-240 MG per 24 hr tablet Take 1 tablet by mouth daily as needed.    Marland Kitchen HYDROcodone-acetaminophen (NORCO/VICODIN) 5-325 MG tablet Take 5-325 tablets by mouth as needed.    . ondansetron (ZOFRAN-ODT) 8 MG disintegrating tablet DISSOLVE 1 TABLET BY MOUTH EVERY 8 HOURS AS NEEDED FOR NAUSEA OR  VOMITING 90 tablet 1  . PARoxetine (PAXIL) 40 MG tablet Take 1 tablet (40 mg total) by mouth every morning. 90 tablet 1  . venlafaxine (EFFEXOR) 100 MG tablet Take 1 tablet (100 mg total) by mouth 2 (two) times daily. 60 tablet 2  . trimethobenzamide (TIGAN) 300 MG capsule Take 300 mg by mouth 4 (four) times daily.     Current Facility-Administered Medications on File Prior to Visit  Medication Dose Route Frequency Provider Last Rate Last Dose  . ipratropium-albuterol (DUONEB) 0.5-2.5 (3) MG/3ML nebulizer solution 3 mL  3 mL Nebulization Q6H Brunetta Jeans, PA-C        BP (!) 140/59 (BP Location: Right Arm, Cuff Size: Large)   Pulse 74   Temp 98.2 F (36.8 C) (Oral)   Ht 5\' 2"  (1.575 m)   Wt 147 lb 6.4 oz (66.9 kg)   LMP 12/28/2011   BMI 26.96 kg/m    Objective:   Physical Exam  Constitutional: She is oriented to person, place, and time. She appears  well-developed and well-nourished.  HENT:  Head: Normocephalic and atraumatic.  Cardiovascular: Normal rate, regular rhythm and normal heart sounds.   No murmur heard. Pulmonary/Chest: Effort normal and breath sounds normal. No respiratory distress. She has no wheezes.  Musculoskeletal: She exhibits no edema.  Neurological: She is alert and oriented to person, place, and time.  Psychiatric: She has a normal mood and affect. Her behavior is normal. Judgment and thought content normal.          Assessment & Plan:

## 2016-05-19 ENCOUNTER — Telehealth: Payer: Self-pay | Admitting: *Deleted

## 2016-05-19 MED ORDER — CLONAZEPAM 1 MG PO TABS: 1.0000 mg | ORAL_TABLET | Freq: Three times a day (TID) | ORAL | 0 refills | Status: DC | PRN

## 2016-05-19 MED FILL — clonazePAM 1 MG TABS: 1 | 30 days supply | Qty: 90 | Fill #0

## 2016-05-19 NOTE — Telephone Encounter (Signed)
Received fax from [facility] requesting refill of pt's clonazepam 1mg .   Last RX: 04/19/16, #90 Last OV: 05/05/16 Next OV: 08/11/16 UDS: 03/19/17, moderate and due 06/17/16  Rx printed and forwarded to PCP for signature.

## 2016-05-19 NOTE — Telephone Encounter (Signed)
Rx was faxed to pharmacy.  

## 2016-05-20 ENCOUNTER — Ambulatory Visit: Payer: 59 | Admitting: Licensed Clinical Social Worker

## 2016-06-11 ENCOUNTER — Other Ambulatory Visit: Payer: Self-pay | Admitting: Family

## 2016-06-11 NOTE — Telephone Encounter (Signed)
Pt needs refill Clonazepam   Last RX: 05/19/2016  Last OV:05/05/2016  Next OV:08/11/2016  UDS:03/28/16  CSC: 10/27/2015

## 2016-06-11 NOTE — Telephone Encounter (Signed)
Refill faxed to pharmacy.

## 2016-06-11 NOTE — Telephone Encounter (Signed)
rx signed

## 2016-06-16 MED FILL — clonazePAM 1 MG TABS: 1 | 30 days supply | Qty: 90 | Fill #0

## 2016-06-22 DIAGNOSIS — Z79899 Other long term (current) drug therapy: Secondary | ICD-10-CM | POA: Diagnosis not present

## 2016-06-22 DIAGNOSIS — G43009 Migraine without aura, not intractable, without status migrainosus: Secondary | ICD-10-CM | POA: Diagnosis not present

## 2016-06-22 DIAGNOSIS — M797 Fibromyalgia: Secondary | ICD-10-CM | POA: Diagnosis not present

## 2016-06-22 DIAGNOSIS — G8929 Other chronic pain: Secondary | ICD-10-CM | POA: Diagnosis not present

## 2016-06-22 DIAGNOSIS — M5442 Lumbago with sciatica, left side: Secondary | ICD-10-CM | POA: Diagnosis not present

## 2016-06-22 DIAGNOSIS — M5418 Radiculopathy, sacral and sacrococcygeal region: Secondary | ICD-10-CM | POA: Diagnosis not present

## 2016-06-22 DIAGNOSIS — M5441 Lumbago with sciatica, right side: Secondary | ICD-10-CM | POA: Diagnosis not present

## 2016-06-28 DIAGNOSIS — G8929 Other chronic pain: Secondary | ICD-10-CM | POA: Diagnosis not present

## 2016-06-28 DIAGNOSIS — M5441 Lumbago with sciatica, right side: Secondary | ICD-10-CM | POA: Diagnosis not present

## 2016-06-28 DIAGNOSIS — M5442 Lumbago with sciatica, left side: Secondary | ICD-10-CM | POA: Diagnosis not present

## 2016-07-07 ENCOUNTER — Other Ambulatory Visit: Payer: Self-pay | Admitting: Family

## 2016-07-07 DIAGNOSIS — Z1231 Encounter for screening mammogram for malignant neoplasm of breast: Secondary | ICD-10-CM

## 2016-07-08 ENCOUNTER — Other Ambulatory Visit: Payer: Self-pay | Admitting: Family

## 2016-07-08 NOTE — Telephone Encounter (Signed)
eScribe request from [facility] for refill on Effexor 100 mg tab Last filled - 05/05/16, #[id number] Last AEX - 05/05/16 Next AEX - 3-Mths, 08/11/16 UDS - 03/20/16, Moderate Please Advise on refills [due on 08/03/16, 30-day cycle]/SLS 04/12

## 2016-07-09 ENCOUNTER — Ambulatory Visit: Payer: Self-pay | Admitting: Family

## 2016-07-15 ENCOUNTER — Ambulatory Visit (INDEPENDENT_AMBULATORY_CARE_PROVIDER_SITE_OTHER): Payer: 59 | Admitting: Licensed Clinical Social Worker

## 2016-07-15 ENCOUNTER — Other Ambulatory Visit: Payer: Self-pay | Admitting: Family

## 2016-07-15 DIAGNOSIS — F3341 Major depressive disorder, recurrent, in partial remission: Secondary | ICD-10-CM | POA: Diagnosis not present

## 2016-07-15 NOTE — Telephone Encounter (Signed)
Ok to send # 90 zero refills. Ask her to come by lab to do UDS pls.

## 2016-07-15 NOTE — Telephone Encounter (Signed)
Called patient and she needs a refill on clonazepam instead.    Clonazepam Last filled 06/11/16 #90 Last ov 05/05/16 Next ov scheduled 08/11/16 Contract signed 10/27/15 UDS due now

## 2016-07-15 NOTE — Telephone Encounter (Signed)
Caller name: [place]  Relation to pt: self  Call back number: [place]  Reason for call: Pt came in office requesting need refill on PARoxetine (PAXIL) 40 MG tablet. Please advise.

## 2016-07-16 MED ORDER — CLONAZEPAM 1 MG PO TABS: ORAL_TABLET | ORAL | 0 refills | Status: DC

## 2016-07-16 MED FILL — clonazePAM 1 MG TABS: 1 | 30 days supply | Qty: 90 | Fill #0

## 2016-07-16 NOTE — Telephone Encounter (Signed)
Rx called to [facility] at [facility]. Notified pt and informed her that she will need to complete UDS today. Pt voices understanding.

## 2016-07-19 ENCOUNTER — Ambulatory Visit (HOSPITAL_BASED_OUTPATIENT_CLINIC_OR_DEPARTMENT_OTHER): Payer: Self-pay

## 2016-07-22 ENCOUNTER — Ambulatory Visit: Payer: Self-pay | Admitting: Licensed Clinical Social Worker

## 2016-08-02 ENCOUNTER — Encounter: Payer: Self-pay | Admitting: Family

## 2016-08-02 ENCOUNTER — Ambulatory Visit (INDEPENDENT_AMBULATORY_CARE_PROVIDER_SITE_OTHER): Payer: 59 | Admitting: Family

## 2016-08-02 VITALS — BP 136/91 | HR 78 | Temp 98.2°F | Resp 20 | Ht 62.0 in | Wt 148.4 lb

## 2016-08-02 DIAGNOSIS — F419 Anxiety disorder, unspecified: Secondary | ICD-10-CM

## 2016-08-02 DIAGNOSIS — R110 Nausea: Secondary | ICD-10-CM | POA: Diagnosis not present

## 2016-08-02 DIAGNOSIS — F329 Major depressive disorder, single episode, unspecified: Secondary | ICD-10-CM | POA: Diagnosis not present

## 2016-08-02 DIAGNOSIS — F418 Other specified anxiety disorders: Secondary | ICD-10-CM

## 2016-08-02 NOTE — Progress Notes (Signed)
Subjective:    Patient ID: [name], female    DOB: 02-11-1951, 66 y.o.   MRN: [id number]  HPI  [name] is a 66 yr old female who presents today for follow up.  Last visit she reported improvement in her depression on increased effexor. She continues current dose of effexor as well as maximum dose of paxil.   Today she reports worsening depression. Denies SI/HI. Her husband accompanies her and describes that she hardly ever leaves the house and has become increasingly withdrawn from her friends.  He states she used to be a very vibrant and active person but seems to really have a low self esteem which stems from her childhood.    Chronic nausea- continues to bother her. Her chronic nausea also makes her feel more depressed.  She is still maintained on fentanyl patch.    Review of Systems    see HPI  Past Medical History:  Diagnosis Date  . Allergy    seasonal  . Arachnoiditis   . Depression   . Elevated liver enzymes   . Environmental allergies   . GERD (gastroesophageal reflux disease)   . H/O being hospitalized 03/2012   x1 week for nausea  . History of chicken pox   . Migraine with typical aura    resolved years ago  . Nausea    chronic nausea      Social History   Social History  . Marital status: Married    Spouse name: N/A  . Number of children: N/A  . Years of education: N/A   Occupational History  . Not on file.   Social History Main Topics  . Smoking status: Former Smoker    Quit date: 08/09/1986  . Smokeless tobacco: Never Used  . Alcohol use No  . Drug use: No  . Sexual activity: No     Comment: husband had vasectomy.    Other Topics Concern  . Not on file   Social History Narrative   2 step daughters- 69 and 69   She worked as an Web designer.   Enjoys puzzles, counted cross stitch.   Completed 1 year of college   4 cats    Past Surgical History:  Procedure Laterality Date  . ABDOMINOPLASTY  07/11/09  . EUS N/A 05/05/2012   Procedure: UPPER ENDOSCOPIC ULTRASOUND (EUS) LINEAR;  Surgeon: Beryle Beams, MD;  Location: [facility];  Service: Endoscopy;  Laterality: N/A;  . LAPAROSCOPIC CHOLECYSTECTOMY  2010  . NASAL SINUS SURGERY     "Has had 2-3 surgeries over the years"    Family History  Problem Relation Age of Onset  . Alzheimer's disease Mother   . Hypertension Mother   . Migraines Mother   . Hypertension Father   . Other Father     ?carotid artery aneurysm  . Cancer Brother     mouth and throat    Allergies  Allergen Reactions  . Compazine [Prochlorperazine]     hyperactivity  . Morphine And Related Hives and Itching    Current Outpatient Prescriptions on File Prior to Visit  Medication Sig Dispense Refill  . clonazePAM (KLONOPIN) 1 MG tablet TAKE 1 TABLET BY MOUTH THREE TIMES A DAY AS NEEDED FOR ANXIETY 90 tablet 0  . fentaNYL (DURAGESIC - DOSED MCG/HR) 12 MCG/HR Place 1 patch onto the skin every 3 (three) days.    . fexofenadine-pseudoephedrine (ALLEGRA-D ALLERGY & CONGESTION) 180-240 MG per 24 hr tablet Take 1 tablet by mouth daily as  needed.    Marland Kitchen HYDROcodone-acetaminophen (NORCO/VICODIN) 5-325 MG tablet Take 5-325 tablets by mouth as needed.    . ondansetron (ZOFRAN-ODT) 8 MG disintegrating tablet DISSOLVE 1 TABLET BY MOUTH EVERY 8 HOURS AS NEEDED FOR NAUSEA OR VOMITING 90 tablet 1  . PARoxetine (PAXIL) 40 MG tablet Take 1 tablet (40 mg total) by mouth every morning. 90 tablet 1  . venlafaxine (EFFEXOR) 100 MG tablet TAKE 1 TABLET BY MOUTH TWO  TIMES DAILY 180 tablet 1   Current Facility-Administered Medications on File Prior to Visit  Medication Dose Route Frequency Provider Last Rate Last Dose  . ipratropium-albuterol (DUONEB) 0.5-2.5 (3) MG/3ML nebulizer solution 3 mL  3 mL Nebulization Q6H Raiford Noble C, PA-C        BP (!) 136/91 (BP Location: Right Arm, Cuff Size: Normal)   Pulse 78   Temp 98.2 F (36.8 C) (Oral)   Resp 20   Ht 5\' 2"  (1.575 m)   Wt 148 lb 6.4 oz (67.3 kg)    LMP 12/28/2011   SpO2 99% Comment: room air  BMI 27.14 kg/m    Objective:   Physical Exam  Constitutional: She appears well-developed and well-nourished. She appears distressed.  Psychiatric: Judgment and thought content normal.  Flat affect, tearful          Assessment & Plan:  Depression- uncontrolled.  At this point, I have recommended referral to psychiatry as well as a therapist.  She is advised to continue current meds and to go to the ER if she develops SI/HI and she verbalizes understanding.  Chronic nausea-  I again spoke with patient today about my high suspicion that fentanyl is causing her nausea.  Unfortunately, when she stops the fentanyl patch she is supplementing with hydrocodone which can also cause her nausea.  I advised her to continue to try to come off of the fentanyl patch to see if this will help her.   25 minutes spent with pt today.  >50% of this time was spent counseling patient on her anxiety/depression and chronic nausea causes.

## 2016-08-02 NOTE — Progress Notes (Signed)
Pre visit review using our clinic review tool, if applicable. No additional management support is needed unless otherwise documented below in the visit note. 

## 2016-08-02 NOTE — Patient Instructions (Signed)
Please work on getting back in with a Social worker. Call psychiatry to schedule a consult. Work with Dr. Macario Carls on trying to come off of fentanyl.  Psychiatric Services:  [facility], [place] [address], [place], [place] [place], [place], [facility], [address], [place], [place]

## 2016-08-11 ENCOUNTER — Ambulatory Visit: Payer: Self-pay | Admitting: Family

## 2016-08-16 ENCOUNTER — Other Ambulatory Visit: Payer: Self-pay | Admitting: Family

## 2016-08-16 MED ORDER — ONDANSETRON 8 MG PO TBDP: ORAL_TABLET | ORAL | 1 refills | Status: DC

## 2016-08-16 NOTE — Telephone Encounter (Signed)
Spoke with pt re: Rx that should be on file with [facility] from 04/09/16, #90 x 1 refill. Advised pt to contact [facility] for refill on file. Pt states current bottle says #60 x no refills and she is not going to go through this. Requests that we send Rx to [place]. Advised pt I will contact [facility] tomorrow to cancel any refills on file and will send rx to [place].

## 2016-08-16 NOTE — Telephone Encounter (Signed)
Caller name: [name] Relationship to patient: self Can be reached: [phone] Pharmacy: [place], [facility]  Reason for call: pt called stating she has 8 tablets left of ondansetron. Please send asap. 90 day supply if possible.

## 2016-08-17 ENCOUNTER — Encounter: Payer: Self-pay | Admitting: Family

## 2016-08-17 ENCOUNTER — Telehealth: Payer: Self-pay | Admitting: *Deleted

## 2016-08-17 DIAGNOSIS — Z79899 Other long term (current) drug therapy: Secondary | ICD-10-CM | POA: Diagnosis not present

## 2016-08-17 MED ORDER — CLONAZEPAM 1 MG PO TABS: ORAL_TABLET | ORAL | 0 refills | Status: DC

## 2016-08-17 MED FILL — clonazePAM 1 MG TABS: 1 | 30 days supply | Qty: 90 | Fill #0

## 2016-08-17 MED FILL — ONDANSETRON ODT 8 MG TABLET: 8 | 30 days supply | Qty: 90 | Fill #0

## 2016-08-17 NOTE — Telephone Encounter (Signed)
Received fax from [facility] requesting refill of clonazepam 1mg .  Last OV:  08/02/16 Next OV:  08/24/16 UDS: 03/19/16 and was due 06/17/16. Will have pt pick up Rx and provide UDS at that time.  Rx printed and forwarded to PCP for signature.

## 2016-08-17 NOTE — Telephone Encounter (Signed)
Rx placed at front desk for pick up and mychart message sent to pt. 

## 2016-08-17 NOTE — Telephone Encounter (Signed)
Spoke with Stormy at [facility] and cancelled previous ondansetron Rx. Refill sent to [facility] on 08/16/16.

## 2016-08-24 ENCOUNTER — Ambulatory Visit: Payer: Medicare Other | Admitting: Family

## 2016-08-25 ENCOUNTER — Ambulatory Visit: Payer: Medicare Other | Admitting: *Deleted

## 2016-08-26 ENCOUNTER — Ambulatory Visit (HOSPITAL_BASED_OUTPATIENT_CLINIC_OR_DEPARTMENT_OTHER): Payer: Self-pay

## 2016-09-02 ENCOUNTER — Ambulatory Visit (HOSPITAL_BASED_OUTPATIENT_CLINIC_OR_DEPARTMENT_OTHER): Payer: Self-pay

## 2016-09-06 ENCOUNTER — Encounter: Payer: Self-pay | Admitting: Family

## 2016-09-06 DIAGNOSIS — R110 Nausea: Secondary | ICD-10-CM

## 2016-09-07 NOTE — Progress Notes (Signed)
Subjective:   [name] is a 66 y.o. female who presents for Medicare Annual (Subsequent) preventive examination.  Review of Systems:  No ROS.  Medicare Wellness Visit. Additional risk factors are reflected in the social history.  Sleep patterns: Takes Zquil, Klonopin, and hydrocodone and still has trouble sleeping. Home Safety/Smoke Alarms: Feels safe in home. Smoke alarms in place.  Living environment; residence and Firearm Safety: Lives at home with husband. Stairs has railing.  Seat Belt Safety/Bike Helmet: Wears seat belt.   Counseling:   Eye Exam- wearing glasses. My eye doctor yearly. Dental- Dr.Robando every 6 months.  Female:   Pap- last 11/01/11: negative per abstract report. Pt states she will make appt.     Mammo-  Scheduled for 09/23/16.     Dexa scan-   Last 04/13/05: no report on file. Declines today. CCS- last 05/28/15: no polyps per pt. (Dr[facility]Cook at [facility])  Cardiac Risk Factors include: advanced age (>76men, >56 women);sedentary lifestyle    Objective:     Vitals: BP 130/82 (BP Location: Right Arm, Patient Position: Sitting, Cuff Size: Normal)   Pulse 83   Ht 5\' 2"  (1.575 m)   Wt 147 lb 9.6 oz (67 kg)   LMP 12/28/2011   SpO2 98%   BMI 27.00 kg/m   Body mass index is 27 kg/m.   Tobacco History  Smoking Status  . Former Smoker  . Quit date: 08/09/1986  Smokeless Tobacco  . Never Used     Counseling given: No   Past Medical History:  Diagnosis Date  . Allergy    seasonal  . Arachnoiditis   . Depression   . Elevated liver enzymes   . Environmental allergies   . GERD (gastroesophageal reflux disease)   . H/O being hospitalized 03/2012   x1 week for nausea  . History of chicken pox   . Migraine with typical aura    resolved years ago  . Nausea    chronic nausea    Past Surgical History:  Procedure Laterality Date  . ABDOMINOPLASTY  07/11/09  . EUS N/A 05/05/2012   Procedure: UPPER ENDOSCOPIC ULTRASOUND (EUS) LINEAR;  Surgeon: Beryle Beams, MD;  Location: [facility];  Service: Endoscopy;  Laterality: N/A;  . LAPAROSCOPIC CHOLECYSTECTOMY  2010  . NASAL SINUS SURGERY     "Has had 2-3 surgeries over the years"   Family History  Problem Relation Age of Onset  . Alzheimer's disease Mother   . Hypertension Mother   . Migraines Mother   . Hypertension Father   . Other Father        ?carotid artery aneurysm  . Cancer Brother        mouth and throat   History  Sexual Activity  . Sexual activity: No    Comment: husband had vasectomy.     Outpatient Encounter Prescriptions as of 09/08/2016  Medication Sig  . clonazePAM (KLONOPIN) 1 MG tablet TAKE 1 TABLET BY MOUTH THREE TIMES A DAY AS NEEDED FOR ANXIETY  . fexofenadine-pseudoephedrine (ALLEGRA-D ALLERGY & CONGESTION) 180-240 MG per 24 hr tablet Take 1 tablet by mouth daily as needed.  Marland Kitchen HYDROcodone-acetaminophen (NORCO/VICODIN) 5-325 MG tablet Take 5-325 tablets by mouth as needed.  . ondansetron (ZOFRAN-ODT) 8 MG disintegrating tablet DISSOLVE 1 TABLET BY MOUTH EVERY 8 HOURS AS NEEDED FOR NAUSEA OR VOMITING  . PARoxetine (PAXIL) 40 MG tablet Take 1 tablet (40 mg total) by mouth every morning.  . venlafaxine (EFFEXOR) 100 MG tablet TAKE 1 TABLET  BY MOUTH TWO  TIMES DAILY  . [DISCONTINUED] fentaNYL (DURAGESIC - DOSED MCG/HR) 12 MCG/HR Place 1 patch onto the skin every 3 (three) days.   Facility-Administered Encounter Medications as of 09/08/2016  Medication  . ipratropium-albuterol (DUONEB) 0.5-2.5 (3) MG/3ML nebulizer solution 3 mL    Activities of Daily Living In your present state of health, do you have any difficulty performing the following activities: 09/08/2016  Hearing? N  Vision? N  Difficulty concentrating or making decisions? N  Walking or climbing stairs? N  Dressing or bathing? N  Doing errands, shopping? N  Preparing Food and eating ? N  Using the Toilet? N  In the past six months, have you accidently leaked urine? N  Do you have problems with loss  of bowel control? N  Managing your Medications? N  Managing your Finances? N  Housekeeping or managing your Housekeeping? N  Some recent data might be hidden    Patient Care Team: [name], NP as PCP - General (Internal Medicine) Renee Pain, MD as Referring Physician (Plastic Surgery) Cleta Alberts, MD as Referring Physician (Neurology) Juanita Craver, MD as Consulting Physician (Gastroenterology)    Assessment:    Physical assessment deferred to PCP.  Exercise Activities and Dietary recommendations Current Exercise Habits: The patient does not participate in regular exercise at present, Exercise limited by: orthopedic condition(s)   Diet (meal preparation, eat out, water intake, caffeinated beverages, dairy products, fruits and vegetables): in general, a "healthy" diet       Goals      Patient Stated   . Would like to feel 100% (pt-stated)      Fall Risk Fall Risk  09/08/2016 03/19/2016 06/13/2015 06/13/2015 12/13/2014  Falls in the past year? Yes No No No No  Number falls in past yr: 1 - - - -  Injury with Fall? No - - - -   Depression Screen PHQ 2/9 Scores 09/08/2016 08/02/2016 03/19/2016 06/13/2015  PHQ - 2 Score 6 6 6 4   PHQ- 9 Score 21 21 21  -  Exception Documentation - - - -     Cognitive Function Ad8 score reviewed for issues:  Issues making decisions:no  Less interest in hobbies / activities:no  Repeats questions, stories (family complaining):no  Trouble using ordinary gadgets (microwave, computer, phone):no  Forgets the month or year: no  Mismanaging finances: no  Remembering appts:no  Daily problems with thinking and/or memory:no Ad8 score is=0        Immunization History  Administered Date(s) Administered  . Influenza, High Dose Seasonal PF 01/09/2016  . Influenza,inj,Quad PF,36+ Mos 12/25/2013, 12/13/2014  . Pneumococcal Polysaccharide-23 12/13/2014  . Td 06/06/2008  . Zoster 01/15/2011   Screening Tests Health Maintenance    Topic Date Due  . PAP SMEAR  11/01/2014  . DEXA SCAN  12/28/2015  . PNA vac Low Risk Adult (1 of 2 - PCV13) 12/28/2015  . HIV Screening  11/27/2016 (Originally 12/27/1965)  . INFLUENZA VACCINE  10/27/2016  . MAMMOGRAM  06/25/2017  . TETANUS/TDAP  06/07/2018  . COLONOSCOPY  05/27/2025  . Hepatitis C Screening  Completed      Plan:  Appointment with [name], NP 09/14/16.  Eat heart healthy diet (full of fruits, vegetables, whole grains, lean protein, water--limit salt, fat, and sugar intake) and increase physical activity as tolerated.  Continue doing brain stimulating activities (puzzles, reading, adult coloring books, staying active) to keep memory sharp.      I have personally reviewed and noted the  following in the patient's chart:   . Medical and social history . Use of alcohol, tobacco or illicit drugs  . Current medications and supplements . Functional ability and status . Nutritional status . Physical activity . Advanced directives . List of other physicians . Hospitalizations, surgeries, and ER visits in previous 12 months . Vitals . Screenings to include cognitive, depression, and falls . Referrals and appointments  In addition, I have reviewed and discussed with patient certain preventive protocols, quality metrics, and best practice recommendations. A written personalized care plan for preventive services as well as general preventive health recommendations were provided to patient.     Shela Nevin, [place]  09/08/2016

## 2016-09-08 ENCOUNTER — Ambulatory Visit (INDEPENDENT_AMBULATORY_CARE_PROVIDER_SITE_OTHER): Payer: 59 | Admitting: *Deleted

## 2016-09-08 ENCOUNTER — Encounter: Payer: Self-pay | Admitting: *Deleted

## 2016-09-08 VITALS — BP 130/82 | HR 83 | Ht 62.0 in | Wt 147.6 lb

## 2016-09-08 DIAGNOSIS — Z0000 Encounter for general adult medical examination without abnormal findings: Secondary | ICD-10-CM | POA: Diagnosis not present

## 2016-09-08 NOTE — Progress Notes (Signed)
Reviewed and agree.

## 2016-09-08 NOTE — Addendum Note (Signed)
Addended by: [name] on: 09/08/2016 05:44 PM   Modules accepted: Level of Service

## 2016-09-08 NOTE — Patient Instructions (Signed)
[name] , Thank you for taking time to come for your Medicare Wellness Visit. I appreciate your ongoing commitment to your health goals. Please review the following plan we discussed and let me know if I can assist you in the future.   These are the goals we discussed: Goals      Patient Stated   . Would like to feel 100% (pt-stated)       This is a list of the screening recommended for you and due dates:  Health Maintenance  Topic Date Due  . Pap Smear  11/01/2014  . DEXA scan (bone density measurement)  12/28/2015  . Pneumonia vaccines (1 of 2 - PCV13) 12/28/2015  . HIV Screening  11/27/2016*  . Flu Shot  10/27/2016  . Mammogram  06/25/2017  . Tetanus Vaccine  06/07/2018  . Colon Cancer Screening  05/27/2025  .  Hepatitis C: One time screening is recommended by Center for Disease Control  (CDC) for  adults born from [birth year] through 1965.   Completed  *Topic was postponed. The date shown is not the original due date.    Appointment with [name], NP 09/14/16.  Eat heart healthy diet (full of fruits, vegetables, whole grains, lean protein, water--limit salt, fat, and sugar intake) and increase physical activity as tolerated.  Continue doing brain stimulating activities (puzzles, reading, adult coloring books, staying active) to keep memory sharp.     Health Maintenance for Postmenopausal Women Menopause is a normal process in which your reproductive ability comes to an end. This process happens gradually over a span of months to years, usually between the ages of 10 and 73. Menopause is complete when you have missed 12 consecutive menstrual periods. It is important to talk with your health care provider about some of the most common conditions that affect postmenopausal women, such as heart disease, cancer, and bone loss (osteoporosis). Adopting a healthy lifestyle and getting preventive care can help to promote your health and wellness. Those actions can also lower your  chances of developing some of these common conditions. What should I know about menopause? During menopause, you may experience a number of symptoms, such as:  Moderate-to-severe hot flashes.  Night sweats.  Decrease in sex drive.  Mood swings.  Headaches.  Tiredness.  Irritability.  Memory problems.  Insomnia.  Choosing to treat or not to treat menopausal changes is an individual decision that you make with your health care provider. What should I know about hormone replacement therapy and supplements? Hormone therapy products are effective for treating symptoms that are associated with menopause, such as hot flashes and night sweats. Hormone replacement carries certain risks, especially as you become older. If you are thinking about using estrogen or estrogen with progestin treatments, discuss the benefits and risks with your health care provider. What should I know about heart disease and stroke? Heart disease, heart attack, and stroke become more likely as you age. This may be due, in part, to the hormonal changes that your body experiences during menopause. These can affect how your body processes dietary fats, triglycerides, and cholesterol. Heart attack and stroke are both medical emergencies. There are many things that you can do to help prevent heart disease and stroke:  Have your blood pressure checked at least every 1-2 years. High blood pressure causes heart disease and increases the risk of stroke.  If you are 67-55 years old, ask your health care provider if you should take aspirin to prevent a heart attack or  a stroke.  Do not use any tobacco products, including cigarettes, chewing tobacco, or electronic cigarettes. If you need help quitting, ask your health care provider.  It is important to eat a healthy diet and maintain a healthy weight. ? Be sure to include plenty of vegetables, fruits, low-fat dairy products, and lean protein. ? Avoid eating foods that are  high in solid fats, added sugars, or salt (sodium).  Get regular exercise. This is one of the most important things that you can do for your health. ? Try to exercise for at least 150 minutes each week. The type of exercise that you do should increase your heart rate and make you sweat. This is known as moderate-intensity exercise. ? Try to do strengthening exercises at least twice each week. Do these in addition to the moderate-intensity exercise.  Know your numbers.Ask your health care provider to check your cholesterol and your blood glucose. Continue to have your blood tested as directed by your health care provider.  What should I know about cancer screening? There are several types of cancer. Take the following steps to reduce your risk and to catch any cancer development as early as possible. Breast Cancer  Practice breast self-awareness. ? This means understanding how your breasts normally appear and feel. ? It also means doing regular breast self-exams. Let your health care provider know about any changes, no matter how small.  If you are 73 or older, have a clinician do a breast exam (clinical breast exam or CBE) every year. Depending on your age, family history, and medical history, it may be recommended that you also have a yearly breast X-ray (mammogram).  If you have a family history of breast cancer, talk with your health care provider about genetic screening.  If you are at high risk for breast cancer, talk with your health care provider about having an MRI and a mammogram every year.  Breast cancer (BRCA) gene test is recommended for women who have family members with BRCA-related cancers. Results of the assessment will determine the need for genetic counseling and BRCA1 and for BRCA2 testing. BRCA-related cancers include these types: ? Breast. This occurs in males or females. ? Ovarian. ? Tubal. This may also be called fallopian tube cancer. ? Cancer of the abdominal or  pelvic lining (peritoneal cancer). ? Prostate. ? Pancreatic.  Cervical, Uterine, and Ovarian Cancer Your health care provider may recommend that you be screened regularly for cancer of the pelvic organs. These include your ovaries, uterus, and vagina. This screening involves a pelvic exam, which includes checking for microscopic changes to the surface of your cervix (Pap test).  For women ages 21-65, health care providers may recommend a pelvic exam and a Pap test every three years. For women ages 84-65, they may recommend the Pap test and pelvic exam, combined with testing for human papilloma virus (HPV), every five years. Some types of HPV increase your risk of cervical cancer. Testing for HPV may also be done on women of any age who have unclear Pap test results.  Other health care providers may not recommend any screening for nonpregnant women who are considered low risk for pelvic cancer and have no symptoms. Ask your health care provider if a screening pelvic exam is right for you.  If you have had past treatment for cervical cancer or a condition that could lead to cancer, you need Pap tests and screening for cancer for at least 20 years after your treatment. If Pap tests  have been discontinued for you, your risk factors (such as having a new sexual partner) need to be reassessed to determine if you should start having screenings again. Some women have medical problems that increase the chance of getting cervical cancer. In these cases, your health care provider may recommend that you have screening and Pap tests more often.  If you have a family history of uterine cancer or ovarian cancer, talk with your health care provider about genetic screening.  If you have vaginal bleeding after reaching menopause, tell your health care provider.  There are currently no reliable tests available to screen for ovarian cancer.  Lung Cancer Lung cancer screening is recommended for adults 57-3 years old  who are at high risk for lung cancer because of a history of smoking. A yearly low-dose CT scan of the lungs is recommended if you:  Currently smoke.  Have a history of at least 30 pack-years of smoking and you currently smoke or have quit within the past 15 years. A pack-year is smoking an average of one pack of cigarettes per day for one year.  Yearly screening should:  Continue until it has been 15 years since you quit.  Stop if you develop a health problem that would prevent you from having lung cancer treatment.  Colorectal Cancer  This type of cancer can be detected and can often be prevented.  Routine colorectal cancer screening usually begins at age 3 and continues through age 45.  If you have risk factors for colon cancer, your health care provider may recommend that you be screened at an earlier age.  If you have a family history of colorectal cancer, talk with your health care provider about genetic screening.  Your health care provider may also recommend using home test kits to check for hidden blood in your stool.  A small camera at the end of a tube can be used to examine your colon directly (sigmoidoscopy or colonoscopy). This is done to check for the earliest forms of colorectal cancer.  Direct examination of the colon should be repeated every 5-10 years until age 13. However, if early forms of precancerous polyps or small growths are found or if you have a family history or genetic risk for colorectal cancer, you may need to be screened more often.  Skin Cancer  Check your skin from head to toe regularly.  Monitor any moles. Be sure to tell your health care provider: ? About any new moles or changes in moles, especially if there is a change in a mole's shape or color. ? If you have a mole that is larger than the size of a pencil eraser.  If any of your family members has a history of skin cancer, especially at a young age, talk with your health care provider  about genetic screening.  Always use sunscreen. Apply sunscreen liberally and repeatedly throughout the day.  Whenever you are outside, protect yourself by wearing long sleeves, pants, a wide-brimmed hat, and sunglasses.  What should I know about osteoporosis? Osteoporosis is a condition in which bone destruction happens more quickly than new bone creation. After menopause, you may be at an increased risk for osteoporosis. To help prevent osteoporosis or the bone fractures that can happen because of osteoporosis, the following is recommended:  If you are 93-68 years old, get at least 1,000 mg of calcium and at least 600 mg of vitamin D per day.  If you are older than age 45 but younger than  age 64, get at least 1,200 mg of calcium and at least 600 mg of vitamin D per day.  If you are older than age 22, get at least 1,200 mg of calcium and at least 800 mg of vitamin D per day.  Smoking and excessive alcohol intake increase the risk of osteoporosis. Eat foods that are rich in calcium and vitamin D, and do weight-bearing exercises several times each week as directed by your health care provider. What should I know about how menopause affects my mental health? Depression may occur at any age, but it is more common as you become older. Common symptoms of depression include:  Low or sad mood.  Changes in sleep patterns.  Changes in appetite or eating patterns.  Feeling an overall lack of motivation or enjoyment of activities that you previously enjoyed.  Frequent crying spells.  Talk with your health care provider if you think that you are experiencing depression. What should I know about immunizations? It is important that you get and maintain your immunizations. These include:  Tetanus, diphtheria, and pertussis (Tdap) booster vaccine.  Influenza every year before the flu season begins.  Pneumonia vaccine.  Shingles vaccine.  Your health care provider may also recommend other  immunizations. This information is not intended to replace advice given to you by your health care provider. Make sure you discuss any questions you have with your health care provider. Document Released: 05/07/2005 Document Revised: 10/03/2015 Document Reviewed: 12/17/2014 Elsevier Interactive Patient Education  2018 [employer].

## 2016-09-14 ENCOUNTER — Ambulatory Visit (INDEPENDENT_AMBULATORY_CARE_PROVIDER_SITE_OTHER): Payer: 59 | Admitting: Family

## 2016-09-14 ENCOUNTER — Encounter: Payer: Self-pay | Admitting: Family

## 2016-09-14 ENCOUNTER — Other Ambulatory Visit (HOSPITAL_COMMUNITY)
Admission: RE | Admit: 2016-09-14 | Discharge: 2016-09-14 | Disposition: A | Discharge status: Home / Self Care | Payer: 59 | Source: Ambulatory Visit | Attending: Family | Admitting: Family

## 2016-09-14 VITALS — BP 139/73 | HR 72 | Temp 98.5°F | Resp 18 | Ht 62.0 in | Wt 144.4 lb

## 2016-09-14 DIAGNOSIS — F329 Major depressive disorder, single episode, unspecified: Secondary | ICD-10-CM

## 2016-09-14 DIAGNOSIS — Z01419 Encounter for gynecological examination (general) (routine) without abnormal findings: Secondary | ICD-10-CM

## 2016-09-14 DIAGNOSIS — N952 Postmenopausal atrophic vaginitis: Secondary | ICD-10-CM

## 2016-09-14 DIAGNOSIS — F32A Depression, unspecified: Secondary | ICD-10-CM

## 2016-09-14 MED ORDER — CLONAZEPAM 1 MG PO TABS: ORAL_TABLET | ORAL | 0 refills | Status: DC

## 2016-09-14 MED ORDER — ESTRADIOL 0.1 MG/GM VA CREA: TOPICAL_CREAM | VAGINAL | 5 refills | Status: DC

## 2016-09-14 MED FILL — ESTRACE 0.01% CREAM: 0.1 | 25 days supply | Qty: 43 | Fill #0

## 2016-09-14 MED FILL — clonazePAM 1 MG TABS: 1 | 30 days supply | Qty: 90 | Fill #0

## 2016-09-14 NOTE — Progress Notes (Signed)
Subjective:    Patient ID: [name], female    DOB: 1950/07/01, 66 y.o.   MRN: [id number]  HPI   Patient presents today with chief complaint of vaginal pain. She reports that she has had vaginal pain with intercourse x 3 years. She has had dryness since menopause.  Depression- worse but she stopped her effexor and paxil because her husband "told me too." She has been of fer of her medications for several weeks.  Review of Systems See HPI  Past Medical History:  Diagnosis Date  . Allergy    seasonal  . Arachnoiditis   . Depression   . Elevated liver enzymes   . Environmental allergies   . GERD (gastroesophageal reflux disease)   . H/O being hospitalized 03/2012   x1 week for nausea  . History of chicken pox   . Migraine with typical aura    resolved years ago  . Nausea    chronic nausea      Social History   Social History  . Marital status: Married    Spouse name: N/A  . Number of children: N/A  . Years of education: N/A   Occupational History  . Not on file.   Social History Main Topics  . Smoking status: Former Smoker    Quit date: 08/09/1986  . Smokeless tobacco: Never Used  . Alcohol use No  . Drug use: No  . Sexual activity: No     Comment: husband had vasectomy.    Other Topics Concern  . Not on file   Social History Narrative   2 step daughters- 48 and 32   She worked as an Web designer.   Enjoys puzzles, counted cross stitch.   Completed 1 year of college   4 cats    Past Surgical History:  Procedure Laterality Date  . ABDOMINOPLASTY  07/11/09  . EUS N/A 05/05/2012   Procedure: UPPER ENDOSCOPIC ULTRASOUND (EUS) LINEAR;  Surgeon: Beryle Beams, MD;  Location: [facility];  Service: Endoscopy;  Laterality: N/A;  . LAPAROSCOPIC CHOLECYSTECTOMY  2010  . NASAL SINUS SURGERY     "Has had 2-3 surgeries over the years"    Family History  Problem Relation Age of Onset  . Alzheimer's disease Mother   . Hypertension Mother   .  Migraines Mother   . Hypertension Father   . Other Father        ?carotid artery aneurysm  . Cancer Brother        mouth and throat    Allergies  Allergen Reactions  . Compazine [Prochlorperazine]     hyperactivity  . Morphine And Related Hives and Itching    Current Outpatient Prescriptions on File Prior to Visit  Medication Sig Dispense Refill  . fexofenadine-pseudoephedrine (ALLEGRA-D ALLERGY & CONGESTION) 180-240 MG per 24 hr tablet Take 1 tablet by mouth daily as needed.    Marland Kitchen HYDROcodone-acetaminophen (NORCO/VICODIN) 5-325 MG tablet Take 5-325 tablets by mouth as needed.    . ondansetron (ZOFRAN-ODT) 8 MG disintegrating tablet DISSOLVE 1 TABLET BY MOUTH EVERY 8 HOURS AS NEEDED FOR NAUSEA OR VOMITING 90 tablet 1  . PARoxetine (PAXIL) 40 MG tablet Take 1 tablet (40 mg total) by mouth every morning. 90 tablet 1  . venlafaxine (EFFEXOR) 100 MG tablet TAKE 1 TABLET BY MOUTH TWO  TIMES DAILY 180 tablet 1   Current Facility-Administered Medications on File Prior to Visit  Medication Dose Route Frequency Provider Last Rate Last Dose  . ipratropium-albuterol (DUONEB) 0.5-2.5 (  3) MG/3ML nebulizer solution 3 mL  3 mL Nebulization Q6H Raiford Noble C, PA-C        BP 139/73 (BP Location: Left Arm, Cuff Size: Normal)   Pulse 72   Temp 98.5 F (36.9 C) (Oral)   Resp 18   Ht 5\' 2"  (1.575 m)   Wt 144 lb 6.4 oz (65.5 kg)   LMP 12/28/2011   SpO2 100%   BMI 26.41 kg/m       Objective:   Physical Exam  Constitutional: She appears well-developed and well-nourished. No distress.  Genitourinary: No vaginal discharge found.  Genitourinary Comments: Vaginal atrophy noted.  Normal cervix  No vaginal lesions.   No adnexal fullness.   Psychiatric:  Flat affect, pleasant.           Assessment & Plan:  Atrophic vaginitis- suspect that this is related to postmenopausal atrophic vaginitis. Will give patient a trial ofvaginal estrogen cream. We discussed pros and cons of estrogen  therapy even topical form. She verbalizes understanding and wishes to proceed.   Depression- uncontrolled. Suspect this is due to her recent abrupt discontinuation Paxil effexor. I've advised her to restart these medications and to keep her upcoming appointment with pscychiatry. She is reminded to go to the emergency room or  She has active suicidal thoughts.

## 2016-09-16 LAB — CYTOLOGY - PAP
Diagnosis: NEGATIVE
HPV: NOT DETECTED

## 2016-09-20 ENCOUNTER — Other Ambulatory Visit: Payer: Self-pay | Admitting: Family

## 2016-09-23 ENCOUNTER — Ambulatory Visit (HOSPITAL_BASED_OUTPATIENT_CLINIC_OR_DEPARTMENT_OTHER): Payer: Self-pay

## 2016-10-05 ENCOUNTER — Ambulatory Visit (HOSPITAL_BASED_OUTPATIENT_CLINIC_OR_DEPARTMENT_OTHER): Payer: Self-pay

## 2016-10-18 ENCOUNTER — Other Ambulatory Visit: Payer: Self-pay | Admitting: Family

## 2016-10-18 NOTE — Telephone Encounter (Signed)
Relation to SX:[id number] Call back [place]: [place], [place] - [address] [phone] (Phone) [phone] (Fax)     Reason for call:  Patient checking on the status clonazePAM (KLONOPIN) 1 MG tablet refill, stating she has 1 left and she takes 3x daily, please advise

## 2016-10-20 ENCOUNTER — Other Ambulatory Visit: Payer: Self-pay | Admitting: Emergency Medicine

## 2016-10-20 MED ORDER — PAROXETINE HCL 40 MG PO TABS: 40.0000 mg | ORAL_TABLET | Freq: Every morning | ORAL | 1 refills | Status: DC

## 2016-10-20 MED ORDER — VENLAFAXINE HCL 100 MG PO TABS: 100.0000 mg | ORAL_TABLET | Freq: Two times a day (BID) | ORAL | 1 refills | Status: DC

## 2016-10-20 MED ORDER — CLONAZEPAM 1 MG PO TABS: ORAL_TABLET | ORAL | 0 refills | Status: DC

## 2016-10-20 NOTE — Telephone Encounter (Signed)
Pt called in to be advised. She says that she is out of depression medication. She said that she was told that she cant take a pain medication with a narcotic, pt would like a call back to discuss and be advised further.    CB: [phone]

## 2016-10-20 NOTE — Telephone Encounter (Signed)
Refills sent on her depression medication as well.  OK to take depression meds with narcotics. Her pain management doctor can decide if they are comfortable prescribing narcotics (hydrocodone) with her being on klonopin.  OK to take depression meds with narcotics. See rx for klonopin.

## 2016-10-20 NOTE — Telephone Encounter (Signed)
See rx for clonazepam.  It is up to her pain management MD if they are comfortable prescribing narcotic (Norco) with clonazepam. It is ok to take her other meds such as paxil and effexor with her narcotic and clonazepam.

## 2016-10-20 NOTE — Telephone Encounter (Signed)
Pt called in to be advised. She says that she is out of depression medication. She said that she was told that she cant take a pain medication with a narcotic, pt would like a call back to discuss and be advised further.    CB: [phone]

## 2016-10-21 ENCOUNTER — Other Ambulatory Visit: Payer: Self-pay | Admitting: Emergency Medicine

## 2016-10-21 MED FILL — clonazePAM 1 MG TABS: 1 | 30 days supply | Qty: 90 | Fill #0

## 2016-10-21 NOTE — Telephone Encounter (Signed)
Tried to call pt back but I keep getting a busy signal.

## 2016-10-25 NOTE — Telephone Encounter (Signed)
See 10/18/16 refill note.

## 2016-10-25 NOTE — Telephone Encounter (Signed)
Attempted to reach pt, line busy and unable to leave message.

## 2016-10-26 NOTE — Telephone Encounter (Signed)
Notified pt. She states Dr Macario Carls will not prescribed Hydrocodone for her so she is no longer taking medication. Med list updated.

## 2016-11-09 MED FILL — ONDANSETRON ODT 8 MG TABLET: 8 | 30 days supply | Qty: 90 | Fill #1

## 2016-12-15 ENCOUNTER — Encounter: Payer: Self-pay | Admitting: Family

## 2016-12-15 ENCOUNTER — Telehealth: Payer: Self-pay | Admitting: Family

## 2016-12-15 ENCOUNTER — Ambulatory Visit (INDEPENDENT_AMBULATORY_CARE_PROVIDER_SITE_OTHER): Payer: 59 | Admitting: Family

## 2016-12-15 VITALS — BP 121/77 | HR 80 | Temp 98.3°F | Resp 16 | Ht 62.0 in | Wt 145.6 lb

## 2016-12-15 DIAGNOSIS — Z0000 Encounter for general adult medical examination without abnormal findings: Secondary | ICD-10-CM | POA: Diagnosis not present

## 2016-12-15 DIAGNOSIS — Z23 Encounter for immunization: Secondary | ICD-10-CM | POA: Diagnosis not present

## 2016-12-15 NOTE — Progress Notes (Signed)
Subjective:    Patient ID: [name], female    DOB: 1951-02-19, 66 y.o.   MRN: [id number]  HPI  [name] is a 66 yr old female who presents today for follow up of her depression.  Saw Dr. Toy Care. Reports that she referred her for EMDR, she was placed on trintellix, deplin and effexor/paxil was decreased.  She continues klonazepam.    She continues to have chronic nausea.   Review of Systems    see HPI  Past Medical History:  Diagnosis Date  . Allergy    seasonal  . Arachnoiditis   . Depression   . Elevated liver enzymes   . Environmental allergies   . GERD (gastroesophageal reflux disease)   . H/O being hospitalized 03/2012   x1 week for nausea  . History of chicken pox   . Migraine with typical aura    resolved years ago  . Nausea    chronic nausea      Social History   Social History  . Marital status: Married    Spouse name: N/A  . Number of children: N/A  . Years of education: N/A   Occupational History  . Not on file.   Social History Main Topics  . Smoking status: Former Smoker    Quit date: 08/09/1986  . Smokeless tobacco: Never Used  . Alcohol use No  . Drug use: No  . Sexual activity: No     Comment: husband had vasectomy.    Other Topics Concern  . Not on file   Social History Narrative   2 step daughters- 56 and 58   She worked as an Web designer.   Enjoys puzzles, counted cross stitch.   Completed 1 year of college   4 cats    Past Surgical History:  Procedure Laterality Date  . ABDOMINOPLASTY  07/11/09  . EUS N/A 05/05/2012   Procedure: UPPER ENDOSCOPIC ULTRASOUND (EUS) LINEAR;  Surgeon: Beryle Beams, MD;  Location: [facility];  Service: Endoscopy;  Laterality: N/A;  . LAPAROSCOPIC CHOLECYSTECTOMY  2010  . NASAL SINUS SURGERY     "Has had 2-3 surgeries over the years"    Family History  Problem Relation Age of Onset  . Alzheimer's disease Mother   . Hypertension Mother   . Migraines Mother   . Hypertension  Father   . Other Father        ?carotid artery aneurysm  . Cancer Brother        mouth and throat    Allergies  Allergen Reactions  . Compazine [Prochlorperazine]     hyperactivity  . Morphine And Related Hives and Itching    Current Outpatient Prescriptions on File Prior to Visit  Medication Sig Dispense Refill  . clonazePAM (KLONOPIN) 1 MG tablet TAKE 1 TABLET BY MOUTH THREE TIMES A DAY AS NEEDED FOR ANXIETY 90 tablet 0  . fexofenadine-pseudoephedrine (ALLEGRA-D ALLERGY & CONGESTION) 180-240 MG per 24 hr tablet Take 1 tablet by mouth daily as needed.    . ondansetron (ZOFRAN-ODT) 8 MG disintegrating tablet DISSOLVE 1 TABLET BY MOUTH EVERY 8 HOURS AS NEEDED FOR NAUSEA OR VOMITING 90 tablet 1  . PARoxetine (PAXIL) 40 MG tablet Take 1 tablet (40 mg total) by mouth every morning. (Patient taking differently: Take 20 mg by mouth every morning. ) 90 tablet 1  . venlafaxine (EFFEXOR) 100 MG tablet Take 1 tablet (100 mg total) by mouth 2 (two) times daily. (Patient taking differently: Take 50 mg by mouth  2 (two) times daily. ) 180 tablet 1   Current Facility-Administered Medications on File Prior to Visit  Medication Dose Route Frequency Provider Last Rate Last Dose  . ipratropium-albuterol (DUONEB) 0.5-2.5 (3) MG/3ML nebulizer solution 3 mL  3 mL Nebulization Q6H Raiford Noble C, PA-C        BP 121/77 (BP Location: Right Arm, Cuff Size: Normal)   Pulse 80   Temp 98.3 F (36.8 C) (Oral)   Resp 16   Ht 5\' 2"  (1.575 m)   Wt 145 lb 9.6 oz (66 kg)   LMP 12/28/2011   SpO2 98%   BMI 26.63 kg/m    Objective:   Physical Exam  Constitutional: She is oriented to person, place, and time. She appears well-developed and well-nourished.  HENT:  Head: Normocephalic and atraumatic.  Neurological: She is alert and oriented to person, place, and time.  Psychiatric: She has a normal mood and affect. Her behavior is normal. Judgment and thought content normal.          Assessment & Plan:   Depression- remains uncontrolled.  I advised her to continue her work with psychiatry.  I also advised her to obtain future refills of clonazepam from psychiatry.   A total of 15  minutes were spent face-to-face with the patient during this encounter and over half of that time was spent on counseling and coordination of care. The patient was counseled on depression and some of the effects that it is likely having on her health such as her chronic nause. Marland Kitchen

## 2016-12-15 NOTE — Telephone Encounter (Signed)
OK with me.

## 2016-12-15 NOTE — Telephone Encounter (Signed)
Pt request switch PCP from [facility] to [facility]. Pt did not want to specify a reason. Call pt [phone].

## 2016-12-15 NOTE — Patient Instructions (Signed)
Please keep your upcoming appointments with psychology.

## 2016-12-16 ENCOUNTER — Telehealth: Payer: Self-pay | Admitting: Family

## 2016-12-16 NOTE — Telephone Encounter (Signed)
-----   Message from Darreld Mclean, MD sent at 12/15/2016  2:48 PM EDT ----- Regarding: RE: transferring pt I am sorry, please let her know I am not accepting internal transfers at this time ----- Message ----- From: Rosalin Hawking Sent: 12/15/2016  10:50 AM To: [name], NP, Darreld Mclean, MD Subject: transferring pt                                Pt states is needing a MD provider and would like to know if ok to transfer from [name] to Dr Lorelei Pont. Please advise.

## 2016-12-16 NOTE — Telephone Encounter (Signed)
Pt was informed the below and understood.

## 2016-12-29 ENCOUNTER — Other Ambulatory Visit: Payer: Self-pay | Admitting: Family

## 2016-12-31 NOTE — Telephone Encounter (Signed)
Melissa, please advise ondansetron request:  Last rx sent 08/16/16, #90 x 1 refill. Ok to send?

## 2017-01-04 MED FILL — ONDANSETRON ODT 8 MG TABLET: 8 | 30 days supply | Qty: 90 | Fill #0

## 2017-01-04 NOTE — Progress Notes (Signed)
[facility] [address], [place], [place] [address] [phone]  Date:  01/05/2017   Name:  [name]   DOB:  Dec 17, 1950   MRN:  [id number]  PCP:  [name], NP    Chief Complaint: facial pressure (Pt reports pressure in face, headaches and post nasal drip with nausea x 2 weeks.)   History of Present Illness:  [name] is a 66 y.o. very pleasant female patient who presents with the following:  Pt of [place] with history of GERD, dyslipidemia She has noted sinus congestion, pain and pressure for about 2 weeks She has PND that can lead to vomiting at times  She has had a temp up to maybe 101 She is not really coughing  She is quite congested in her nose She has noted some chills No body aches Sx started with left ear ache and ST, this is now better She has noted some occasional diarrhea No sick contacts   She has tried allegra, ibuprofen  She has IBSD but her sx have not been particularly active recently  Patient Active Problem List   Diagnosis Date Noted  . Hypertriglyceridemia 06/15/2015  . Allergic rhinitis 10/16/2014  . GERD (gastroesophageal reflux disease) 10/16/2014  . Reactive airway disease 09/11/2014  . Chronic nausea 12/25/2013  . Anxiety and depression 12/25/2013  . Arachnoiditis   . Umbilical hernia 81/03/7508    Past Medical History:  Diagnosis Date  . Allergy    seasonal  . Arachnoiditis   . Depression   . Elevated liver enzymes   . Environmental allergies   . GERD (gastroesophageal reflux disease)   . H/O being hospitalized 03/2012   x1 week for nausea  . History of chicken pox   . Migraine with typical aura    resolved years ago  . Nausea    chronic nausea     Past Surgical History:  Procedure Laterality Date  . ABDOMINOPLASTY  07/11/09  . EUS N/A 05/05/2012   Procedure: UPPER ENDOSCOPIC ULTRASOUND (EUS) LINEAR;  Surgeon: Beryle Beams, MD;  Location: [facility]  ENDOSCOPY;  Service: Endoscopy;  Laterality: N/A;  . LAPAROSCOPIC CHOLECYSTECTOMY  2010  . NASAL SINUS SURGERY     "Has had 2-3 surgeries over the years"    Social History  Substance Use Topics  . Smoking status: Former Smoker    Quit date: 08/09/1986  . Smokeless tobacco: Never Used  . Alcohol use No    Family History  Problem Relation Age of Onset  . Alzheimer's disease Mother   . Hypertension Mother   . Migraines Mother   . Hypertension Father   . Other Father        ?carotid artery aneurysm  . Cancer Brother        mouth and throat    Allergies  Allergen Reactions  . Compazine [Prochlorperazine]     hyperactivity  . Morphine And Related Hives and Itching    Medication list has been reviewed and updated.  Current Outpatient Prescriptions on File Prior to Visit  Medication Sig Dispense Refill  . clonazePAM (KLONOPIN) 1 MG tablet TAKE 1 TABLET BY MOUTH THREE TIMES A DAY AS NEEDED FOR ANXIETY 90 tablet 0  . fexofenadine-pseudoephedrine (ALLEGRA-D ALLERGY & CONGESTION) 180-240 MG per 24 hr tablet Take 1 tablet by mouth daily as needed.    Marland Kitchen L-Methylfolate (DEPLIN PO) Take 1 tablet by mouth daily.    . ondansetron (ZOFRAN-ODT) 8  MG disintegrating tablet DISSOLVE 1 TABLET BY MOUTH EVERY 8 HOURS AS NEEDED FOR NAUSEA OR VOMITING 90 tablet 0  . PARoxetine (PAXIL) 40 MG tablet Take 1 tablet (40 mg total) by mouth every morning. (Patient taking differently: Take 20 mg by mouth every morning. ) 90 tablet 1  . vortioxetine HBr (TRINTELLIX) 10 MG TABS Take 1 tablet by mouth daily.     Current Facility-Administered Medications on File Prior to Visit  Medication Dose Route Frequency Provider Last Rate Last Dose  . ipratropium-albuterol (DUONEB) 0.5-2.5 (3) MG/3ML nebulizer solution 3 mL  3 mL Nebulization Q6H Brunetta Jeans, PA-C        Review of Systems:  As per HPI- otherwise negative.   Physical Examination: Vitals:   01/05/17 1510  BP: 128/80  Pulse: 71  Resp: 16   Temp: 98.4 F (36.9 C)  SpO2: 97%   Vitals:   01/05/17 1510  Weight: 144 lb 9.6 oz (65.6 kg)  Height: 5\' 2"  (1.575 m)   Body mass index is 26.45 kg/m. Ideal Body Weight: Weight in (lb) to have BMI = 25: 136.4  GEN: WDWN, NAD, Non-toxic, A & O x 3, looks well HEENT: Atraumatic, Normocephalic. Neck supple. No masses, No LAD.  Bilateral TM wnl, oropharynx normal.  PEERL,EOMI.   Nasal cavity is inflamed and red, she has frontal sinus tenderness to percussion Ears and Nose: No external deformity. CV: RRR, No M/G/R. No JVD. No thrill. No extra heart sounds. PULM: CTA B, no wheezes, crackles, rhonchi. No retractions. No resp. distress. No accessory muscle use. ABD: S, NT, ND EXTR: No c/c/e NEURO Normal gait.  PSYCH: Normally interactive. Conversant. Not depressed or anxious appearing.  Calm demeanor.    Assessment and Plan: Acute recurrent frontal sinusitis - Plan: amoxicillin-clavulanate (AUGMENTIN) 875-125 MG tablet  Here today with a likely sinus infection She has noted possible low grade fevers at home- will treat with augmentin for 10 days.  She will let me know if not improved in the next few days- Sooner if worse.    Signed Lamar Blinks, MD

## 2017-01-05 ENCOUNTER — Encounter: Payer: Self-pay | Admitting: Family Medicine

## 2017-01-05 ENCOUNTER — Ambulatory Visit (INDEPENDENT_AMBULATORY_CARE_PROVIDER_SITE_OTHER): Payer: 59 | Admitting: Family Medicine

## 2017-01-05 VITALS — BP 128/80 | HR 71 | Temp 98.4°F | Resp 16 | Ht 62.0 in | Wt 144.6 lb

## 2017-01-05 DIAGNOSIS — J0111 Acute recurrent frontal sinusitis: Secondary | ICD-10-CM | POA: Diagnosis not present

## 2017-01-05 MED ORDER — AMOXICILLIN-POT CLAVULANATE 875-125 MG PO TABS: 1.0000 | ORAL_TABLET | Freq: Two times a day (BID) | ORAL | 0 refills | Status: DC

## 2017-01-05 MED FILL — AMOX-CLAV 875-125 MG TABLET: 875-125 | 10 days supply | Qty: 20 | Fill #0

## 2017-01-05 NOTE — Patient Instructions (Signed)
We are going to treat you for a sinus infection with augmentin twice a day for 10 days  Continue using OTC medications as needed- you might try adding mucinex to your regimen as needed for sinus congestion and pressure Please let me know if you are not feeling better in the next few days I would also encourage a probiotic to help avoid diarrhea while you are on the antibiotic

## 2017-01-12 DIAGNOSIS — G43009 Migraine without aura, not intractable, without status migrainosus: Secondary | ICD-10-CM | POA: Diagnosis not present

## 2017-01-12 DIAGNOSIS — M5418 Radiculopathy, sacral and sacrococcygeal region: Secondary | ICD-10-CM | POA: Diagnosis not present

## 2017-01-12 DIAGNOSIS — M797 Fibromyalgia: Secondary | ICD-10-CM | POA: Diagnosis not present

## 2017-01-12 DIAGNOSIS — G039 Meningitis, unspecified: Secondary | ICD-10-CM | POA: Diagnosis not present

## 2017-01-12 DIAGNOSIS — M5442 Lumbago with sciatica, left side: Secondary | ICD-10-CM | POA: Diagnosis not present

## 2017-01-12 DIAGNOSIS — M5441 Lumbago with sciatica, right side: Secondary | ICD-10-CM | POA: Diagnosis not present

## 2017-01-19 ENCOUNTER — Encounter: Payer: Self-pay | Admitting: Family

## 2017-01-19 ENCOUNTER — Ambulatory Visit (INDEPENDENT_AMBULATORY_CARE_PROVIDER_SITE_OTHER): Payer: 59 | Admitting: Family

## 2017-01-19 VITALS — BP 125/75 | HR 77 | Temp 98.3°F | Resp 16 | Ht 62.0 in | Wt 146.6 lb

## 2017-01-19 DIAGNOSIS — D229 Melanocytic nevi, unspecified: Secondary | ICD-10-CM

## 2017-01-19 DIAGNOSIS — J309 Allergic rhinitis, unspecified: Secondary | ICD-10-CM

## 2017-01-19 MED ORDER — FLUTICASONE PROPIONATE 50 MCG/ACT NA SUSP: 2.0000 | Freq: Every day | NASAL | 6 refills | Status: DC

## 2017-01-19 MED ORDER — LORATADINE 10 MG PO TABS
10.0000 mg | ORAL_TABLET | Freq: Every day | ORAL | 11 refills | Status: DC
Start: 2017-01-19 — End: 2017-02-14

## 2017-01-19 NOTE — Patient Instructions (Signed)
Your symptoms seem to be most related to allergy symptoms.  Please begin Claritin 10 mg once daily as well as Flonase 2 sprays each nostril once daily. Call if you develop fever over 101, no worsening symptoms, or if your symptoms are not improved in 4-5 days.

## 2017-01-19 NOTE — Progress Notes (Signed)
99213-Low MDM due to OTC med and acute uncomplicated illness.   Thanks, [employer]

## 2017-01-19 NOTE — Addendum Note (Signed)
Addended by: [name] on: 01/19/2017 01:18 PM   Modules accepted: Level of Service

## 2017-01-19 NOTE — Progress Notes (Signed)
Subjective:    Patient ID: [name], female    DOB: 1950-12-25, 66 y.o.   MRN: [id number]  HPI   Patient is a 66 year old female who presents today with ongoing complaints of facial pressure.  She was evaluated on January 05, 2017 and diagnosed with sinusitis. She was treated with a 10-day course of Augmentin.  She was also advised to use Mucinex as needed for congestion and pressure. She has had chills/eye burning but has not taken temp.  Has some post-nasal drip, which makes her more nauseas. Reports mild fullness in her sinuses.  Had only mild improvement in her symptoms with augmentin. Using mucinex without significant improvement in her symptoms.  She reports eye itching and sneezing.  Reports that she has multiple moles. Would like referral to dermatology for skin check.  Review of Systems    see HPI  Past Medical History:  Diagnosis Date  . Allergy    seasonal  . Arachnoiditis   . Depression   . Elevated liver enzymes   . Environmental allergies   . GERD (gastroesophageal reflux disease)   . H/O being hospitalized 03/2012   x1 week for nausea  . History of chicken pox   . Migraine with typical aura    resolved years ago  . Nausea    chronic nausea      Social History   Social History  . Marital status: Married    Spouse name: N/A  . Number of children: N/A  . Years of education: N/A   Occupational History  . Not on file.   Social History Main Topics  . Smoking status: Former Smoker    Quit date: 08/09/1986  . Smokeless tobacco: Never Used  . Alcohol use No  . Drug use: No  . Sexual activity: No     Comment: husband had vasectomy.    Other Topics Concern  . Not on file   Social History Narrative   2 step daughters- 16 and 21   She worked as an Web designer.   Enjoys puzzles, counted cross stitch.   Completed 1 year of college   4 cats    Past Surgical History:  Procedure Laterality Date  . ABDOMINOPLASTY  07/11/09  . EUS N/A  05/05/2012   Procedure: UPPER ENDOSCOPIC ULTRASOUND (EUS) LINEAR;  Surgeon: Beryle Beams, MD;  Location: [facility] END[facility]COPY;  Service: Endoscopy;  Laterality: N/A;  . LAPAROSCOPIC CHOLECYSTECTOMY  2010  . NASAL SINUS SURGERY     "Has had 2-3 surgeries over the years"    Family History  Problem Relation Age of Onset  . Alzheimer's disease Mother   . Hypertension Mother   . Migraines Mother   . Hypertension Father   . Other Father        ?carotid artery aneurysm  . Cancer Brother        mouth and throat    Allergies  Allergen Reactions  . Compazine [Prochlorperazine]     hyperactivity  . Morphine And Related Hives and Itching    Current Outpatient Prescriptions on File Prior to Visit  Medication Sig Dispense Refill  . clonazePAM (KLONOPIN) 1 MG tablet TAKE 1 TABLET BY MOUTH THREE TIMES A DAY AS NEEDED FOR ANXIETY 90 tablet 0  . L-Methylfolate (DEPLIN PO) Take 1 tablet by mouth daily.    . ondansetron (ZOFRAN-ODT) 8 MG disintegrating tablet DISSOLVE 1 TABLET BY MOUTH EVERY 8 HOURS AS NEEDED FOR NAUSEA OR VOMITING 90 tablet 0  . PARoxetine (PAXIL)  40 MG tablet Take 1 tablet (40 mg total) by mouth every morning. (Patient taking differently: Take 10 mg by mouth every morning. ) 90 tablet 1  . vortioxetine HBr (TRINTELLIX) 10 MG TABS Take 15 mg by mouth daily.      Current Facility-Administered Medications on File Prior to Visit  Medication Dose Route Frequency Provider Last Rate Last Dose  . ipratropium-albuterol (DUONEB) 0.5-2.5 (3) MG/3ML nebulizer solution 3 mL  3 mL Nebulization Q6H Martin, William C, PA-C        BP 125/75 (BP Location: Right Arm, Cuff Size: Normal)   Pulse 77   Temp 98.3 F (36.8 C) (Oral)   Resp 16   Ht 5\' 2"  (1.575 m)   Wt 146 lb 9.6 oz (66.5 kg)   LMP 12/28/2011   SpO2 97%   BMI 26.81 kg/m    Objective:   Physical Exam  Constitutional: She is oriented to person, place, and time. She appears well-developed and well-nourished.  Pt swallowing post  nasal drip frequently during visit  HENT:  Right Ear: Tympanic membrane and ear canal normal.  Left Ear: Tympanic membrane and ear canal normal.  Nose: Right sinus exhibits no frontal sinus tenderness. Left sinus exhibits no frontal sinus tenderness.  Mouth/Throat: No oropharyngeal exudate or posterior oropharyngeal edema.  Mild bilateral maxillary tenderness to palpation  Cardiovascular: Normal rate, regular rhythm and normal heart sounds.   No murmur heard. Pulmonary/Chest: Effort normal and breath sounds normal. No respiratory distress. She has no wheezes.  Musculoskeletal: She exhibits no edema.  Lymphadenopathy:    She has no cervical adenopathy.  Neurological: She is alert and oriented to person, place, and time.  Psychiatric: She has a normal mood and affect. Her behavior is normal. Judgment and thought content normal.          Assessment & Plan:  Allergic rhinitis- Symptoms most consistent with allergic rhinitis. Advised pt as follows:    Your symptoms seem to be most related to allergy symptoms.  Please begin Claritin 10 mg once daily as well as Flonase 2 sprays each nostril once daily. Call if you develop fever over 101, no worsening symptoms, or if your symptoms are not improved in 4-5 days.  Moles- requests referral to dermatology for skin check. Will place referral.

## 2017-01-25 ENCOUNTER — Telehealth: Payer: Self-pay | Admitting: Family

## 2017-01-25 NOTE — Telephone Encounter (Signed)
Pt says dermatology in [place] is too far. Pt wants a female dermatologist in [place] if possible please. Please make referral to female dermatology [place].

## 2017-01-25 NOTE — Telephone Encounter (Signed)
Referral was never sent to [facility], Original referral was sent to [facility], their office would not schedule patient due to NOS of appt. Then it was sent to [facility]. Referral has now been sent to [facility], awaiting appt

## 2017-01-25 NOTE — Telephone Encounter (Signed)
Delsa Sale-- can you please look into this and see where referral went? Can we get her in with a derm in [place]?

## 2017-02-14 ENCOUNTER — Ambulatory Visit (INDEPENDENT_AMBULATORY_CARE_PROVIDER_SITE_OTHER): Payer: 59 | Admitting: Family

## 2017-02-14 ENCOUNTER — Encounter: Payer: Self-pay | Admitting: Family

## 2017-02-14 VITALS — BP 129/69 | HR 77 | Temp 98.2°F | Resp 18 | Ht 62.0 in | Wt 151.6 lb

## 2017-02-14 DIAGNOSIS — J329 Chronic sinusitis, unspecified: Secondary | ICD-10-CM

## 2017-02-14 MED ORDER — AMOXICILLIN-POT CLAVULANATE 875-125 MG PO TABS: 1.0000 | ORAL_TABLET | Freq: Two times a day (BID) | ORAL | 0 refills | Status: DC

## 2017-02-14 MED FILL — AMOX-CLAV 875-125 MG TABLET: 875-125 | 14 days supply | Qty: 28 | Fill #0

## 2017-02-14 NOTE — Patient Instructions (Signed)
Continue probiotic, start augmentin. Continue allegra. Call if new/worsening symptoms or if not improved in 1 week.

## 2017-02-14 NOTE — Progress Notes (Signed)
Subjective:    Patient ID: [name], female    DOB: 1950/05/01, 66 y.o.   MRN: [id number]  HPI  [name] is a 66 yr old female who presents today with chief complaint of nasal congestion.   Continues to have nasal congestion/scratchy throat.  claritin did not help, allegra did help. flonase caused GI upset. Had some vomiting due to the trintellix that she was prescribed by Dr. Toy Care who stopped trintellix and increased her paxil.  Reports + facial pressure, ear pressure, mild intermittent cough, mild voice hoarseness.    Review of Systems See HPI  Past Medical History:  Diagnosis Date  . Allergy    seasonal  . Arachnoiditis   . Depression   . Elevated liver enzymes   . Environmental allergies   . GERD (gastroesophageal reflux disease)   . H/O being hospitalized 03/2012   x1 week for nausea  . History of chicken pox   . Migraine with typical aura    resolved years ago  . Nausea    chronic nausea      Social History   Socioeconomic History  . Marital status: Married    Spouse name: Not on file  . Number of children: Not on file  . Years of education: Not on file  . Highest education level: Not on file  Social Needs  . Financial resource strain: Not on file  . Food insecurity - worry: Not on file  . Food insecurity - inability: Not on file  . Transportation needs - medical: Not on file  . Transportation needs - non-medical: Not on file  Occupational History  . Not on file  Tobacco Use  . Smoking status: Former Smoker    Last attempt to quit: 08/09/1986    Years since quitting: 30.5  . Smokeless tobacco: Never Used  Substance and Sexual Activity  . Alcohol use: No  . Drug use: No  . Sexual activity: No    Partners: Male    Birth control/protection: Other-see comments    Comment: husband had vasectomy.   Other Topics Concern  . Not on file  Social History Narrative   2 step daughters- 92 and 50   She worked as an Web designer.   Enjoys  puzzles, counted cross stitch.   Completed 1 year of college   4 cats    Past Surgical History:  Procedure Laterality Date  . ABDOMINOPLASTY  07/11/09  . LAPAROSCOPIC CHOLECYSTECTOMY  2010  . NASAL SINUS SURGERY     "Has had 2-3 surgeries over the years"  . UPPER ENDOSCOPIC ULTRASOUND (EUS) LINEAR N/A 05/05/2012   Performed by Beryle Beams, MD at [facility] History  Problem Relation Age of Onset  . Alzheimer's disease Mother   . Hypertension Mother   . Migraines Mother   . Hypertension Father   . Other Father        ?carotid artery aneurysm  . Cancer Brother        mouth and throat    Allergies  Allergen Reactions  . Compazine [Prochlorperazine]     hyperactivity  . Morphine And Related Hives and Itching  . Trintellix [Vortioxetine] Nausea And Vomiting    Current Outpatient Medications on File Prior to Visit  Medication Sig Dispense Refill  . clonazePAM (KLONOPIN) 1 MG tablet TAKE 1 TABLET BY MOUTH THREE TIMES A DAY AS NEEDED FOR ANXIETY 90 tablet 0  . fexofenadine (ALLEGRA) 60 MG tablet Take 60 mg  2 (two) times daily by mouth.    Marland Kitchen L-Methylfolate (DEPLIN PO) Take 1 tablet by mouth daily.    . mirtazapine (REMERON) 15 MG tablet Take 15 mg by mouth at bedtime.  12  . ondansetron (ZOFRAN-ODT) 8 MG disintegrating tablet DISSOLVE 1 TABLET BY MOUTH EVERY 8 HOURS AS NEEDED FOR NAUSEA OR VOMITING 90 tablet 0  . PARoxetine (PAXIL) 40 MG tablet Take 1 tablet (40 mg total) by mouth every morning. (Patient taking differently: Take 10 mg by mouth every morning. ) 90 tablet 1  . topiramate (TOPAMAX) 25 MG tablet START 1 AT BED TIME AND INCREASE BY 1 WEEKLY TO 2 TWICE DAILY  1   Current Facility-Administered Medications on File Prior to Visit  Medication Dose Route Frequency Provider Last Rate Last Dose  . ipratropium-albuterol (DUONEB) 0.5-2.5 (3) MG/3ML nebulizer solution 3 mL  3 mL Nebulization Q6H Raiford Noble C, PA-C        BP 129/69 (BP Location: Right Arm,  Cuff Size: Normal)   Pulse 77   Temp 98.2 F (36.8 C) (Oral)   Resp 18   Ht 5\' 2"  (1.575 m)   Wt 151 lb 9.6 oz (68.8 kg)   LMP 12/28/2011   SpO2 98%   BMI 27.73 kg/m       Objective:   Physical Exam  Constitutional: She appears well-developed and well-nourished.  HENT:  Head: Normocephalic and atraumatic.  Right Ear: Tympanic membrane and ear canal normal.  Left Ear: Tympanic membrane and ear canal normal.  Nose: Right sinus exhibits maxillary sinus tenderness and frontal sinus tenderness. Left sinus exhibits maxillary sinus tenderness and frontal sinus tenderness.  Mouth/Throat: No oropharyngeal exudate, posterior oropharyngeal edema or posterior oropharyngeal erythema.  Cardiovascular: Normal rate, regular rhythm and normal heart sounds.  No murmur heard. Pulmonary/Chest: Effort normal and breath sounds normal. No respiratory distress. She has no wheezes.  Psychiatric: She has a normal mood and affect. Her behavior is normal. Judgment and thought content normal.          Assessment & Plan:   Sinusitis- will rx with augmentin for a longer course. Plan 14 day course. Continue allegra. If symptoms worsen or fail to improve consider referral to ENT.

## 2017-02-22 DIAGNOSIS — G43009 Migraine without aura, not intractable, without status migrainosus: Secondary | ICD-10-CM | POA: Diagnosis not present

## 2017-02-22 DIAGNOSIS — M5442 Lumbago with sciatica, left side: Secondary | ICD-10-CM | POA: Diagnosis not present

## 2017-02-22 DIAGNOSIS — M797 Fibromyalgia: Secondary | ICD-10-CM | POA: Diagnosis not present

## 2017-02-22 DIAGNOSIS — M5441 Lumbago with sciatica, right side: Secondary | ICD-10-CM | POA: Diagnosis not present

## 2017-02-22 DIAGNOSIS — M5418 Radiculopathy, sacral and sacrococcygeal region: Secondary | ICD-10-CM | POA: Diagnosis not present

## 2017-02-28 ENCOUNTER — Other Ambulatory Visit: Payer: Self-pay | Admitting: Family

## 2017-03-01 MED FILL — ONDANSETRON ODT 8 MG TABLET: 8 | 30 days supply | Qty: 90 | Fill #0

## 2017-03-05 ENCOUNTER — Other Ambulatory Visit: Payer: Self-pay | Admitting: Family

## 2017-03-12 ENCOUNTER — Other Ambulatory Visit: Payer: Self-pay | Admitting: Family

## 2017-04-19 ENCOUNTER — Other Ambulatory Visit: Payer: Self-pay | Admitting: Family

## 2017-04-19 MED FILL — ONDANSETRON ODT 8 MG TABLET: 8 | 30 days supply | Qty: 90 | Fill #0

## 2017-05-25 DIAGNOSIS — M5442 Lumbago with sciatica, left side: Secondary | ICD-10-CM | POA: Diagnosis not present

## 2017-05-25 DIAGNOSIS — G43009 Migraine without aura, not intractable, without status migrainosus: Secondary | ICD-10-CM | POA: Diagnosis not present

## 2017-05-25 DIAGNOSIS — M5441 Lumbago with sciatica, right side: Secondary | ICD-10-CM | POA: Diagnosis not present

## 2017-05-25 DIAGNOSIS — M797 Fibromyalgia: Secondary | ICD-10-CM | POA: Diagnosis not present

## 2017-05-25 DIAGNOSIS — G8929 Other chronic pain: Secondary | ICD-10-CM | POA: Diagnosis not present

## 2017-06-13 ENCOUNTER — Other Ambulatory Visit: Payer: Self-pay | Admitting: Family

## 2017-06-13 MED FILL — ONDANSETRON ODT 8 MG TABLET: 8 | 30 days supply | Qty: 90 | Fill #0

## 2017-08-12 ENCOUNTER — Other Ambulatory Visit: Payer: Self-pay | Admitting: Family

## 2017-08-12 MED FILL — ONDANSETRON ODT 8 MG TABLET: 8 | 30 days supply | Qty: 90 | Fill #0

## 2017-08-13 ENCOUNTER — Other Ambulatory Visit: Payer: Self-pay | Admitting: Family

## 2017-08-15 NOTE — Telephone Encounter (Signed)
90 day supply of Paxil sent to [facility]. Pt is due for follow up of anxiety / depression. Please call pt to schedule appt with [facility] soon. Thanks!

## 2017-08-16 NOTE — Telephone Encounter (Signed)
Called pt several times and only received busy signals. Will attempt again later.

## 2017-09-07 NOTE — Progress Notes (Signed)
Subjective:   [name] is a 67 y.o. female who presents for Medicare Annual (Subsequent) preventive examination.  Review of Systems: No ROS.  Medicare Wellness Visit. Additional risk factors are reflected in the social history.  Cardiac Risk Factors include: advanced age (>50men, >53 women);sedentary lifestyle Sleep patterns:  Sleep about 10 hrs with taking Zquil, Klonopin, and remeron. Naps as needed. Home Safety/Smoke Alarms: Feels safe in home. Smoke alarms in place.  Living environment; residence and Firearm Safety: Lives with husband in 2 story home. Does well with stairs.   Female:   Pap-utd       Mammo- active order      Dexa scan- declines       [facility]- 05/28/15-[facility] per pt     Objective:     Vitals: BP 120/72 (BP Location: Left Arm, Patient Position: Sitting, Cuff Size: Normal)   Pulse 94   Ht 5\' 2"  (1.575 m)   Wt 160 lb 12.8 oz (72.9 kg)   LMP 12/28/2011   SpO2 98%   BMI 29.41 kg/m   Body mass index is 29.41 kg/m.  Advanced Directives 09/09/2017 09/08/2016 12/13/2014 05/05/2012  Does Patient Have a Medical Advance Directive? No Yes No Patient has advance directive, copy not in chart  Type of Advance Directive - [place];Living will - Living will  Copy of [place] in Chart? - No - copy requested - -  Would patient like information on creating a medical advance directive? Yes (MAU/Ambulatory/Procedural Areas - Information given) - Yes - Educational materials given -  Pre-existing out of facility DNR order (yellow form or pink MOST form) - - - No    Tobacco Social History   Tobacco Use  Smoking Status Former Smoker  . Last attempt to quit: 08/09/1986  . Years since quitting: 31.1  Smokeless Tobacco Never Used     Counseling given: Not Answered   Clinical Intake Pain : No/denies pain      Past Medical History:  Diagnosis Date  . Allergy    seasonal  . Arachnoiditis   . Depression   . Elevated liver enzymes     . Environmental allergies   . GERD (gastroesophageal reflux disease)   . H/O being hospitalized 03/2012   x1 week for nausea  . History of chicken pox   . Migraine with typical aura    resolved years ago  . Nausea    chronic nausea    Past Surgical History:  Procedure Laterality Date  . ABDOMINOPLASTY  07/11/09  . EUS N/A 05/05/2012   Procedure: UPPER ENDOSCOPIC ULTRASOUND (EUS) LINEAR;  Surgeon: Beryle Beams, MD;  Location: [facility];  Service: Endoscopy;  Laterality: N/A;  . LAPAROSCOPIC CHOLECYSTECTOMY  2010  . NASAL SINUS SURGERY     "Has had 2-3 surgeries over the years"   Family History  Problem Relation Age of Onset  . Alzheimer's disease Mother   . Hypertension Mother   . Migraines Mother   . Hypertension Father   . Other Father        ?carotid artery aneurysm  . Cancer Brother        mouth and throat   Social History   Socioeconomic History  . Marital status: Married    Spouse name: Not on file  . Number of children: Not on file  . Years of education: Not on file  . Highest education level: Not on file  Occupational History  . Not on file  Social Needs  . Financial resource strain: Not on file  . Food insecurity:    Worry: Not on file    Inability: Not on file  . Transportation needs:    Medical: Not on file    Non-medical: Not on file  Tobacco Use  . Smoking status: Former Smoker    Last attempt to quit: 08/09/1986    Years since quitting: 31.1  . Smokeless tobacco: Never Used  Substance and Sexual Activity  . Alcohol use: No  . Drug use: No  . Sexual activity: Never    Partners: Male    Birth control/protection: Other-see comments    Comment: husband had vasectomy.   Lifestyle  . Physical activity:    Days per week: Not on file    Minutes per session: Not on file  . Stress: Not on file  Relationships  . Social connections:    Talks on phone: Not on file    Gets together: Not on file    Attends religious service: Not on file    Active  member of club or organization: Not on file    Attends meetings of clubs or organizations: Not on file    Relationship status: Not on file  Other Topics Concern  . Not on file  Social History Narrative   2 step daughters- 30 and 45   She worked as an Web designer.   Enjoys puzzles, counted cross stitch.   Completed 1 year of college   4 cats    Outpatient Encounter Medications as of 09/09/2017  Medication Sig  . amphetamine-dextroamphetamine (ADDERALL) 20 MG tablet Take 20 mg by mouth daily.  . clonazePAM (KLONOPIN) 1 MG tablet TAKE 1 TABLET BY MOUTH THREE TIMES A DAY AS NEEDED FOR ANXIETY  . fexofenadine (ALLEGRA) 60 MG tablet Take 60 mg 2 (two) times daily by mouth.  . mirtazapine (REMERON) 15 MG tablet Take 15 mg by mouth at bedtime.  Marland Kitchen PARoxetine (PAXIL) 40 MG tablet TAKE 1 TABLET BY MOUTH  EVERY MORNING  . L-Methylfolate (DEPLIN PO) Take 1 tablet by mouth daily.  . ondansetron (ZOFRAN-ODT) 8 MG disintegrating tablet DISSOLVE 1 TABLET BY MOUTH EVERY 8 HOURS AS NEEDED FOR NAUSEA OR VOMITING (Patient not taking: Reported on 09/09/2017)  . topiramate (TOPAMAX) 25 MG tablet START 1 AT BED TIME AND INCREASE BY 1 WEEKLY TO 2 TWICE DAILY  . [DISCONTINUED] amoxicillin-clavulanate (AUGMENTIN) 875-125 MG tablet Take 1 tablet 2 (two) times daily by mouth.   Facility-Administered Encounter Medications as of 09/09/2017  Medication  . ipratropium-albuterol (DUONEB) 0.5-2.5 (3) MG/3ML nebulizer solution 3 mL    Activities of Daily Living In your present state of health, do you have any difficulty performing the following activities: 09/09/2017  Hearing? N  Vision? N  Comment wears glasses.  Difficulty concentrating or making decisions? N  Walking or climbing stairs? N  Dressing or bathing? N  Doing errands, shopping? N  Preparing Food and eating ? N  Using the Toilet? N  In the past six months, have you accidently leaked urine? N  Do you have problems with loss of bowel  control? N  Managing your Medications? N  Managing your Finances? N  Housekeeping or managing your Housekeeping? N  Some recent data might be hidden    Patient Care Team: [name], NP as PCP - General (Internal Medicine) Renee Pain, MD as Referring Physician (Plastic Surgery) Cleta Alberts, MD as Referring Physician (Neurology) Juanita Craver, MD as Consulting Physician (  Gastroenterology)    Assessment:   This is a routine wellness examination for Garnell. Physical assessment deferred to PCP.  Exercise Activities and Dietary recommendations Current Exercise Habits: The patient does not participate in regular exercise at present, Exercise limited by: None identified   Diet (meal preparation, eat out, water intake, caffeinated beverages, dairy products, fruits and vegetables): well balanced, on average, 3 meals per day Breakfast: cereal Lunch: breakfast bar and fruit Dinner:  cereal    Goals    . Continue involvement in social activities       Fall Risk Fall Risk  09/09/2017 09/08/2016 03/19/2016 06/13/2015 06/13/2015  Falls in the past year? No Yes No No No  Number falls in past yr: - 1 - - -  Injury with Fall? - No - - -    Depression Screen PHQ 2/9 Scores 09/09/2017 12/15/2016 09/08/2016 08/02/2016  PHQ - 2 Score 1 6 6 6   PHQ- 9 Score - 21 21 21   Exception Documentation - - - -     Cognitive Function MMSE - Mini Mental State Exam 09/09/2017  Orientation to time 5  Orientation to Place 5  Registration 3  Attention/ Calculation 5  Recall 3  Language- name 2 objects 2  Language- repeat 1  Language- follow 3 step command 3  Language- read & follow direction 1  Write a sentence 1  Copy design 1  Total score 30        Immunization History  Administered Date(s) Administered  . Influenza, High Dose Seasonal PF 01/09/2016, 12/15/2016  . Influenza,inj,Quad PF,6+ Mos 12/25/2013, 12/13/2014  . Pneumococcal Polysaccharide-23 12/13/2014  . Td 06/06/2008  .  Zoster 01/15/2011    Screening Tests Health Maintenance  Topic Date Due  . PNA vac Low Risk Adult (1 of 2 - PCV13) 12/28/2015  . MAMMOGRAM  06/25/2017  . DEXA SCAN  09/10/2018 (Originally 12/28/2015)  . INFLUENZA VACCINE  10/27/2017  . TETANUS/TDAP  06/07/2018  . COLONOSCOPY  05/27/2025  . Hepatitis C Screening  Completed       Plan:     Please schedule your next medicare wellness visit with me in 1 yr.  Continue to eat heart healthy diet (full of fruits, vegetables, whole grains, lean protein, water--limit salt, fat, and sugar intake) and increase physical activity as tolerated.  Continue doing brain stimulating activities (puzzles, reading, adult coloring books, staying active) to keep memory sharp.     I have personally reviewed and noted the following in the patient's chart:   . Medical and social history . Use of alcohol, tobacco or illicit drugs  . Current medications and supplements . Functional ability and status . Nutritional status . Physical activity . Advanced directives . List of other physicians . Hospitalizations, surgeries, and ER visits in previous 12 months . Vitals . Screenings to include cognitive, depression, and falls . Referrals and appointments  In addition, I have reviewed and discussed with patient certain preventive protocols, quality metrics, and best practice recommendations. A written personalized care plan for preventive services as well as general preventive health recommendations were provided to patient.     Shela Nevin, [place]  09/09/2017

## 2017-09-09 ENCOUNTER — Encounter: Payer: Self-pay | Admitting: *Deleted

## 2017-09-09 ENCOUNTER — Ambulatory Visit (INDEPENDENT_AMBULATORY_CARE_PROVIDER_SITE_OTHER): Payer: 59 | Admitting: *Deleted

## 2017-09-09 VITALS — BP 120/72 | HR 94 | Ht 62.0 in | Wt 160.8 lb

## 2017-09-09 DIAGNOSIS — Z0000 Encounter for general adult medical examination without abnormal findings: Secondary | ICD-10-CM

## 2017-09-09 NOTE — Progress Notes (Signed)
RN note reviewed and agree.  

## 2017-09-09 NOTE — Patient Instructions (Signed)
Great to see you today!!!  Please schedule your next medicare wellness visit with me in 1 yr.  Continue to eat heart healthy diet (full of fruits, vegetables, whole grains, lean protein, water--limit salt, fat, and sugar intake) and increase physical activity as tolerated.  Continue doing brain stimulating activities (puzzles, reading, adult coloring books, staying active) to keep memory sharp.    [name] , Thank you for taking time to come for your Medicare Wellness Visit. I appreciate your ongoing commitment to your health goals. Please review the following plan we discussed and let me know if I can assist you in the future.   These are the goals we discussed: Goals    . Continue involvement in social activities       This is a list of the screening recommended for you and due dates:  Health Maintenance  Topic Date Due  . Pneumonia vaccines (1 of 2 - PCV13) 12/28/2015  . Mammogram  06/25/2017  . DEXA scan (bone density measurement)  09/10/2018*  . Flu Shot  10/27/2017  . Tetanus Vaccine  06/07/2018  . Colon Cancer Screening  05/27/2025  .  Hepatitis C: One time screening is recommended by Center for Disease Control  (CDC) for  adults born from [birth year] through 1965.   Completed  *Topic was postponed. The date shown is not the original due date.    Health Maintenance for Postmenopausal Women Menopause is a normal process in which your reproductive ability comes to an end. This process happens gradually over a span of months to years, usually between the ages of 14 and 88. Menopause is complete when you have missed 12 consecutive menstrual periods. It is important to talk with your health care provider about some of the most common conditions that affect postmenopausal women, such as heart disease, cancer, and bone loss (osteoporosis). Adopting a healthy lifestyle and getting preventive care can help to promote your health and wellness. Those actions can also lower your chances of  developing some of these common conditions. What should I know about menopause? During menopause, you may experience a number of symptoms, such as:  Moderate-to-severe hot flashes.  Night sweats.  Decrease in sex drive.  Mood swings.  Headaches.  Tiredness.  Irritability.  Memory problems.  Insomnia.  Choosing to treat or not to treat menopausal changes is an individual decision that you make with your health care provider. What should I know about hormone replacement therapy and supplements? Hormone therapy products are effective for treating symptoms that are associated with menopause, such as hot flashes and night sweats. Hormone replacement carries certain risks, especially as you become older. If you are thinking about using estrogen or estrogen with progestin treatments, discuss the benefits and risks with your health care provider. What should I know about heart disease and stroke? Heart disease, heart attack, and stroke become more likely as you age. This may be due, in part, to the hormonal changes that your body experiences during menopause. These can affect how your body processes dietary fats, triglycerides, and cholesterol. Heart attack and stroke are both medical emergencies. There are many things that you can do to help prevent heart disease and stroke:  Have your blood pressure checked at least every 1-2 years. High blood pressure causes heart disease and increases the risk of stroke.  If you are 1-63 years old, ask your health care provider if you should take aspirin to prevent a heart attack or a stroke.  Do not use  any tobacco products, including cigarettes, chewing tobacco, or electronic cigarettes. If you need help quitting, ask your health care provider.  It is important to eat a healthy diet and maintain a healthy weight. ? Be sure to include plenty of vegetables, fruits, low-fat dairy products, and lean protein. ? Avoid eating foods that are high in solid  fats, added sugars, or salt (sodium).  Get regular exercise. This is one of the most important things that you can do for your health. ? Try to exercise for at least 150 minutes each week. The type of exercise that you do should increase your heart rate and make you sweat. This is known as moderate-intensity exercise. ? Try to do strengthening exercises at least twice each week. Do these in addition to the moderate-intensity exercise.  Know your numbers.Ask your health care provider to check your cholesterol and your blood glucose. Continue to have your blood tested as directed by your health care provider.  What should I know about cancer screening? There are several types of cancer. Take the following steps to reduce your risk and to catch any cancer development as early as possible. Breast Cancer  Practice breast self-awareness. ? This means understanding how your breasts normally appear and feel. ? It also means doing regular breast self-exams. Let your health care provider know about any changes, no matter how small.  If you are 33 or older, have a clinician do a breast exam (clinical breast exam or CBE) every year. Depending on your age, family history, and medical history, it may be recommended that you also have a yearly breast X-ray (mammogram).  If you have a family history of breast cancer, talk with your health care provider about genetic screening.  If you are at high risk for breast cancer, talk with your health care provider about having an MRI and a mammogram every year.  Breast cancer (BRCA) gene test is recommended for women who have family members with BRCA-related cancers. Results of the assessment will determine the need for genetic counseling and BRCA1 and for BRCA2 testing. BRCA-related cancers include these types: ? Breast. This occurs in males or females. ? Ovarian. ? Tubal. This may also be called fallopian tube cancer. ? Cancer of the abdominal or pelvic lining  (peritoneal cancer). ? Prostate. ? Pancreatic.  Cervical, Uterine, and Ovarian Cancer Your health care provider may recommend that you be screened regularly for cancer of the pelvic organs. These include your ovaries, uterus, and vagina. This screening involves a pelvic exam, which includes checking for microscopic changes to the surface of your cervix (Pap test).  For women ages 21-65, health care providers may recommend a pelvic exam and a Pap test every three years. For women ages 30-65, they may recommend the Pap test and pelvic exam, combined with testing for human papilloma virus (HPV), every five years. Some types of HPV increase your risk of cervical cancer. Testing for HPV may also be done on women of any age who have unclear Pap test results.  Other health care providers may not recommend any screening for nonpregnant women who are considered low risk for pelvic cancer and have no symptoms. Ask your health care provider if a screening pelvic exam is right for you.  If you have had past treatment for cervical cancer or a condition that could lead to cancer, you need Pap tests and screening for cancer for at least 20 years after your treatment. If Pap tests have been discontinued for you, your  risk factors (such as having a new sexual partner) need to be reassessed to determine if you should start having screenings again. Some women have medical problems that increase the chance of getting cervical cancer. In these cases, your health care provider may recommend that you have screening and Pap tests more often.  If you have a family history of uterine cancer or ovarian cancer, talk with your health care provider about genetic screening.  If you have vaginal bleeding after reaching menopause, tell your health care provider.  There are currently no reliable tests available to screen for ovarian cancer.  Lung Cancer Lung cancer screening is recommended for adults 49-58 years old who are at  high risk for lung cancer because of a history of smoking. A yearly low-dose CT scan of the lungs is recommended if you:  Currently smoke.  Have a history of at least 30 pack-years of smoking and you currently smoke or have quit within the past 15 years. A pack-year is smoking an average of one pack of cigarettes per day for one year.  Yearly screening should:  Continue until it has been 15 years since you quit.  Stop if you develop a health problem that would prevent you from having lung cancer treatment.  Colorectal Cancer  This type of cancer can be detected and can often be prevented.  Routine colorectal cancer screening usually begins at age 38 and continues through age 16.  If you have risk factors for colon cancer, your health care provider may recommend that you be screened at an earlier age.  If you have a family history of colorectal cancer, talk with your health care provider about genetic screening.  Your health care provider may also recommend using home test kits to check for hidden blood in your stool.  A small camera at the end of a tube can be used to examine your colon directly (sigmoidoscopy or colonoscopy). This is done to check for the earliest forms of colorectal cancer.  Direct examination of the colon should be repeated every 5-10 years until age 83. However, if early forms of precancerous polyps or small growths are found or if you have a family history or genetic risk for colorectal cancer, you may need to be screened more often.  Skin Cancer  Check your skin from head to toe regularly.  Monitor any moles. Be sure to tell your health care provider: ? About any new moles or changes in moles, especially if there is a change in a mole's shape or color. ? If you have a mole that is larger than the size of a pencil eraser.  If any of your family members has a history of skin cancer, especially at a young age, talk with your health care provider about genetic  screening.  Always use sunscreen. Apply sunscreen liberally and repeatedly throughout the day.  Whenever you are outside, protect yourself by wearing long sleeves, pants, a wide-brimmed hat, and sunglasses.  What should I know about osteoporosis? Osteoporosis is a condition in which bone destruction happens more quickly than new bone creation. After menopause, you may be at an increased risk for osteoporosis. To help prevent osteoporosis or the bone fractures that can happen because of osteoporosis, the following is recommended:  If you are 73-26 years old, get at least 1,000 mg of calcium and at least 600 mg of vitamin D per day.  If you are older than age 70 but younger than age 77, get at least 63,200  mg of calcium and at least 600 mg of vitamin D per day.  If you are older than age 107, get at least 1,200 mg of calcium and at least 800 mg of vitamin D per day.  Smoking and excessive alcohol intake increase the risk of osteoporosis. Eat foods that are rich in calcium and vitamin D, and do weight-bearing exercises several times each week as directed by your health care provider. What should I know about how menopause affects my mental health? Depression may occur at any age, but it is more common as you become older. Common symptoms of depression include:  Low or sad mood.  Changes in sleep patterns.  Changes in appetite or eating patterns.  Feeling an overall lack of motivation or enjoyment of activities that you previously enjoyed.  Frequent crying spells.  Talk with your health care provider if you think that you are experiencing depression. What should I know about immunizations? It is important that you get and maintain your immunizations. These include:  Tetanus, diphtheria, and pertussis (Tdap) booster vaccine.  Influenza every year before the flu season begins.  Pneumonia vaccine.  Shingles vaccine.  Your health care provider may also recommend other  immunizations. This information is not intended to replace advice given to you by your health care provider. Make sure you discuss any questions you have with your health care provider. Document Released: 05/07/2005 Document Revised: 10/03/2015 Document Reviewed: 12/17/2014 Elsevier Interactive Patient Education  2018 [employer].

## 2017-10-25 ENCOUNTER — Other Ambulatory Visit: Payer: Self-pay | Admitting: Family

## 2017-10-26 NOTE — Telephone Encounter (Signed)
90 day supply of paxil sent to [facility]. Pt was due for 6 month follow up in March and is past due. Previous phone call attempts have been unsuccessful. Mailed letter to schedule appt soon.

## 2017-11-01 ENCOUNTER — Encounter: Payer: Self-pay | Admitting: Internal Medicine

## 2017-11-01 ENCOUNTER — Ambulatory Visit (INDEPENDENT_AMBULATORY_CARE_PROVIDER_SITE_OTHER): Payer: 59 | Admitting: Internal Medicine

## 2017-11-01 VITALS — BP 122/72 | HR 76 | Temp 98.3°F | Resp 16 | Ht 62.0 in | Wt 161.2 lb

## 2017-11-01 DIAGNOSIS — J019 Acute sinusitis, unspecified: Secondary | ICD-10-CM

## 2017-11-01 MED ORDER — AMOXICILLIN-POT CLAVULANATE 875-125 MG PO TABS: 1.0000 | ORAL_TABLET | Freq: Two times a day (BID) | ORAL | 0 refills | Status: DC

## 2017-11-01 MED ORDER — AZELASTINE HCL 0.1 % NA SOLN: 2.0000 | Freq: Every evening | NASAL | 3 refills | Status: DC | PRN

## 2017-11-01 MED FILL — AMOX-CLAV 875-125 MG TABLET: 875-125 | 7 days supply | Qty: 14 | Fill #0

## 2017-11-01 NOTE — Patient Instructions (Signed)
Rest, fluids , tylenol  For cough:  Take Mucinex DM twice a day as needed until better  For nasal congestion: Use OTC   Flonase : 2 nasal sprays on each side of the nose in the morning until you feel better Use ASTELIN a prescribed spray : 2 nasal sprays on each side of the nose at night until you feel better   Take the antibiotic as prescribed  (Augmentin)  Call if not gradually better over the next  10 days  Call anytime if the symptoms are severe

## 2017-11-01 NOTE — Progress Notes (Signed)
Pre visit review using our clinic review tool, if applicable. No additional management support is needed unless otherwise documented below in the visit note. 

## 2017-11-01 NOTE — Progress Notes (Signed)
Subjective:    Patient ID: [name], female    DOB: 1951/01/17, 67 y.o.   MRN: [id number]  DOS:  11/01/2017 Type of visit - description : acute Interval history: Sx started 2 weeks ago: Sinus congestion, worse on the left, eyes watery and itchy, + sore throat. Unable to get any nasal discharge when blowing her nose. Few days later developed some cough and raspy voice. was taking Allegra, thinks she is switched to Claritin-D without much help.  Review of Systems  Denies any fever chills Some chest congestion and wheezing noted by her husband.  Past Medical History:  Diagnosis Date  . Allergy    seasonal  . Arachnoiditis   . Depression   . Elevated liver enzymes   . Environmental allergies   . GERD (gastroesophageal reflux disease)   . H/O being hospitalized 03/2012   x1 week for nausea  . History of chicken pox   . Migraine with typical aura    resolved years ago  . Nausea    chronic nausea     Past Surgical History:  Procedure Laterality Date  . ABDOMINOPLASTY  07/11/09  . EUS N/A 05/05/2012   Procedure: UPPER ENDOSCOPIC ULTRASOUND (EUS) LINEAR;  Surgeon: Beryle Beams, MD;  Location: [facility];  Service: Endoscopy;  Laterality: N/A;  . LAPAROSCOPIC CHOLECYSTECTOMY  2010  . NASAL SINUS SURGERY     "Has had 2-3 surgeries over the years"    Social History   Socioeconomic History  . Marital status: Married    Spouse name: Not on file  . Number of children: Not on file  . Years of education: Not on file  . Highest education level: Not on file  Occupational History  . Not on file  Social Needs  . Financial resource strain: Not on file  . Food insecurity:    Worry: Not on file    Inability: Not on file  . Transportation needs:    Medical: Not on file    Non-medical: Not on file  Tobacco Use  . Smoking status: Former Smoker    Last attempt to quit: 08/09/1986    Years since quitting: 31.2  . Smokeless tobacco: Never Used  Substance and Sexual Activity    . Alcohol use: No  . Drug use: No  . Sexual activity: Not Currently    Partners: Male    Birth control/protection: Other-see comments    Comment: husband had vasectomy.   Lifestyle  . Physical activity:    Days per week: Not on file    Minutes per session: Not on file  . Stress: Not on file  Relationships  . Social connections:    Talks on phone: Not on file    Gets together: Not on file    Attends religious service: Not on file    Active member of club or organization: Not on file    Attends meetings of clubs or organizations: Not on file    Relationship status: Not on file  . Intimate partner violence:    Fear of current or ex partner: Not on file    Emotionally abused: Not on file    Physically abused: Not on file    Forced sexual activity: Not on file  Other Topics Concern  . Not on file  Social History Narrative   2 step daughters- 31 and 105   She worked as an Web designer.   Enjoys puzzles, counted cross stitch.   Completed 1 year of college  4 cats      Allergies as of 11/01/2017      Reactions   Compazine [prochlorperazine]    hyperactivity   Morphine And Related Hives, Itching   Trintellix [vortioxetine] Nausea And Vomiting      Medication List        Accurate as of 11/01/17  6:34 PM. Always use your most recent med list.          amoxicillin-clavulanate 875-125 MG tablet Commonly known as:  AUGMENTIN Take 1 tablet by mouth 2 (two) times daily.   amphetamine-dextroamphetamine 20 MG tablet Commonly known as:  ADDERALL Take 20 mg by mouth daily.   azelastine 0.1 % nasal spray Commonly known as:  ASTELIN Place 2 sprays into both nostrils at bedtime as needed for rhinitis. Use in each nostril as directed   clonazePAM 1 MG tablet Commonly known as:  KLONOPIN TAKE 1 TABLET BY MOUTH THREE TIMES A DAY AS NEEDED FOR ANXIETY   DEPLIN PO Take 1 tablet by mouth daily.   fexofenadine 60 MG tablet Commonly known as:  ALLEGRA Take 60 mg 2  (two) times daily by mouth.   loratadine-pseudoephedrine 10-240 MG 24 hr tablet Commonly known as:  CLARITIN-D 24-hour Take 1 tablet by mouth daily.   mirtazapine 15 MG tablet Commonly known as:  REMERON Take 15 mg by mouth at bedtime.   ondansetron 8 MG disintegrating tablet Commonly known as:  ZOFRAN-ODT DISSOLVE 1 TABLET BY MOUTH EVERY 8 HOURS AS NEEDED FOR NAUSEA OR VOMITING   PARoxetine 40 MG tablet Commonly known as:  PAXIL TAKE 1 TABLET BY MOUTH  EVERY MORNING   topiramate 25 MG tablet Commonly known as:  TOPAMAX START 1 AT BED TIME AND INCREASE BY 1 WEEKLY TO 2 TWICE DAILY          Objective:   Physical Exam BP 122/72 (BP Location: Left Arm, Patient Position: Sitting, Cuff Size: Small)   Pulse 76   Temp 98.3 F (36.8 C) (Oral)   Resp 16   Ht 5\' 2"  (1.575 m)   Wt 161 lb 4 oz (73.1 kg)   LMP 12/28/2011   SpO2 97%   BMI 29.49 kg/m  General:   Well developed, NAD, see BMI.  HEENT:  Normocephalic . Face symmetric, atraumatic. Raspy voice noted. TMs: Bulge, worse on the left.  Nose congested.  Sinuses: TTP at the maxillary and frontal areas, worse on the left maxillary area.  Throat symmetric. lungs:  CTA B Normal respiratory effort, no intercostal retractions, no accessory muscle use. Heart: RRR,  no murmur.  No pretibial edema bilaterally  Skin: Not pale. Not jaundice Neurologic:  alert & oriented X3.  Speech normal, gait appropriate for age and unassisted Psych--  Cognition and judgment appear intact.  Cooperative with normal attention span and concentration.  Behavior appropriate. No anxious or depressed appearing.      Assessment & Plan:   Sinusitis: Symptoms likely due to sinusitis, in addition to supportive treatment, will prescribe Augmentin.  See AVS.  Call if not better.  Also, advised not to take Claritin-D long-term.

## 2017-11-11 ENCOUNTER — Ambulatory Visit (INDEPENDENT_AMBULATORY_CARE_PROVIDER_SITE_OTHER): Payer: 59 | Admitting: Family

## 2017-11-11 ENCOUNTER — Encounter: Payer: Self-pay | Admitting: Family

## 2017-11-11 VITALS — BP 139/85 | HR 66 | Temp 98.3°F | Resp 16 | Ht 62.0 in | Wt 162.0 lb

## 2017-11-11 DIAGNOSIS — J309 Allergic rhinitis, unspecified: Secondary | ICD-10-CM | POA: Diagnosis not present

## 2017-11-11 DIAGNOSIS — F32A Depression, unspecified: Secondary | ICD-10-CM

## 2017-11-11 DIAGNOSIS — F329 Major depressive disorder, single episode, unspecified: Secondary | ICD-10-CM

## 2017-11-11 MED ORDER — ZOSTER VAC RECOMB ADJUVANTED 50 MCG/0.5ML IM SUSR: 0.5000 mL | Freq: Once | INTRAMUSCULAR | 1 refills | Status: AC

## 2017-11-11 NOTE — Progress Notes (Signed)
Subjective:    Patient ID: [name], female    DOB: [birth year], 67 y.o.   MRN: [id number]  HPI   [name] is a 67 yr old female who presents today for follow up.  1) Depression/anxiety- maintained on paxil and prn clonazepam.  She is also maintained on Remeron.  She is following with Dr. Toy Care. Reports that adderall was added to paroxetine.  Since that time she feels great. Has been getting out of the house more.  Seeing family.    2) Allergic rhinitis- she is using claritin D and flonase.  Reports that symptoms are stable on this regimen.    Review of Systems    see HPI  Past Medical History:  Diagnosis Date  . Allergy    seasonal  . Arachnoiditis   . Depression   . Elevated liver enzymes   . Environmental allergies   . GERD (gastroesophageal reflux disease)   . H/O being hospitalized 03/2012   x1 week for nausea  . History of chicken pox   . Migraine with typical aura    resolved years ago  . Nausea    chronic nausea      Social History   Socioeconomic History  . Marital status: Married    Spouse name: Not on file  . Number of children: Not on file  . Years of education: Not on file  . Highest education level: Not on file  Occupational History  . Not on file  Social Needs  . Financial resource strain: Not on file  . Food insecurity:    Worry: Not on file    Inability: Not on file  . Transportation needs:    Medical: Not on file    Non-medical: Not on file  Tobacco Use  . Smoking status: Former Smoker    Last attempt to quit: 08/09/1986    Years since quitting: 31.2  . Smokeless tobacco: Never Used  Substance and Sexual Activity  . Alcohol use: No  . Drug use: No  . Sexual activity: Not Currently    Partners: Male    Birth control/protection: Other-see comments    Comment: husband had vasectomy.   Lifestyle  . Physical activity:    Days per week: Not on file    Minutes per session: Not on file  . Stress: Not on file  Relationships  . Social  connections:    Talks on phone: Not on file    Gets together: Not on file    Attends religious service: Not on file    Active member of club or organization: Not on file    Attends meetings of clubs or organizations: Not on file    Relationship status: Not on file  . Intimate partner violence:    Fear of current or ex partner: Not on file    Emotionally abused: Not on file    Physically abused: Not on file    Forced sexual activity: Not on file  Other Topics Concern  . Not on file  Social History Narrative   2 step daughters- 8 and 52   She worked as an Web designer.   Enjoys puzzles, counted cross stitch.   Completed 1 year of college   4 cats    Past Surgical History:  Procedure Laterality Date  . ABDOMINOPLASTY  07/11/09  . EUS N/A 05/05/2012   Procedure: UPPER ENDOSCOPIC ULTRASOUND (EUS) LINEAR;  Surgeon: Beryle Beams, MD;  Location: [facility];  Service: Endoscopy;  Laterality:  N/A;  . LAPAROSCOPIC CHOLECYSTECTOMY  2010  . NASAL SINUS SURGERY     "Has had 2-3 surgeries over the years"    Family History  Problem Relation Age of Onset  . Alzheimer's disease Mother   . Hypertension Mother   . Migraines Mother   . Hypertension Father   . Other Father        ?carotid artery aneurysm  . Cancer Brother        mouth and throat    Allergies  Allergen Reactions  . Compazine [Prochlorperazine]     hyperactivity  . Morphine And Related Hives and Itching  . Trintellix [Vortioxetine] Nausea And Vomiting    Current Outpatient Medications on File Prior to Visit  Medication Sig Dispense Refill  . amphetamine-dextroamphetamine (ADDERALL) 20 MG tablet Take 20 mg by mouth daily.    Marland Kitchen azelastine (ASTELIN) 0.1 % nasal spray Place 2 sprays into both nostrils at bedtime as needed for rhinitis. Use in each nostril as directed 30 mL 3  . clonazePAM (KLONOPIN) 1 MG tablet TAKE 1 TABLET BY MOUTH THREE TIMES A DAY AS NEEDED FOR ANXIETY 90 tablet 0  .  loratadine-pseudoephedrine (CLARITIN-D 24-HOUR) 10-240 MG 24 hr tablet Take 1 tablet by mouth daily.    . mirtazapine (REMERON) 15 MG tablet Take 15 mg by mouth at bedtime.  12  . ondansetron (ZOFRAN-ODT) 8 MG disintegrating tablet DISSOLVE 1 TABLET BY MOUTH EVERY 8 HOURS AS NEEDED FOR NAUSEA OR VOMITING (Patient not taking: Reported on 09/09/2017) 90 tablet 0  . PARoxetine (PAXIL) 40 MG tablet TAKE 1 TABLET BY MOUTH  EVERY MORNING 90 tablet 0   No current facility-administered medications on file prior to visit.     BP 139/85 (BP Location: Right Arm, Patient Position: Sitting, Cuff Size: Small)   Pulse 66   Temp 98.3 F (36.8 C) (Oral)   Resp 16   Ht 5\' 2"  (1.575 m)   Wt 162 lb (73.5 kg)   LMP 12/28/2011   SpO2 96%   BMI 29.63 kg/m    Objective:   Physical Exam  Constitutional: She is oriented to person, place, and time. She appears well-developed and well-nourished.  Cardiovascular: Normal rate, regular rhythm and normal heart sounds.  No murmur heard. Pulmonary/Chest: Effort normal and breath sounds normal. No respiratory distress. She has no wheezes.  Neurological: She is alert and oriented to person, place, and time.  Psychiatric: She has a normal mood and affect. Her behavior is normal. Judgment and thought content normal.          Assessment & Plan:  Depression- her mood is the best I have ever seen her.  Advised her to continue her work with Dr. Toy Care.  Allergic rhinitis- stable. Continue current meds.

## 2017-11-14 ENCOUNTER — Other Ambulatory Visit: Payer: Self-pay | Admitting: Family

## 2017-11-22 DIAGNOSIS — M5418 Radiculopathy, sacral and sacrococcygeal region: Secondary | ICD-10-CM | POA: Diagnosis not present

## 2017-11-22 DIAGNOSIS — M5442 Lumbago with sciatica, left side: Secondary | ICD-10-CM | POA: Diagnosis not present

## 2017-11-22 DIAGNOSIS — M5441 Lumbago with sciatica, right side: Secondary | ICD-10-CM | POA: Diagnosis not present

## 2017-11-22 DIAGNOSIS — G43009 Migraine without aura, not intractable, without status migrainosus: Secondary | ICD-10-CM | POA: Diagnosis not present

## 2017-12-13 ENCOUNTER — Ambulatory Visit: Payer: Self-pay

## 2017-12-13 NOTE — Telephone Encounter (Signed)
Call placed to patient who called with c/o dizziness. Pt states it has been going on since 12/09/17. She feels the room is spinning when she sits up.  She denies weakness, tingling, on one side. She is unsure about fever but has had sinus congestion she has been treating with musinex for several days.  She was treated about 1 month ago for a sinus infection that involved her left ear.  She feel that her right ear is full and questions ear infection this time.  She has never experienced vertigo. Care advice given and appointment per protocol.    Reason for Disposition . [1] MODERATE dizziness (e.g., vertigo; feels very unsteady, interferes with normal activities) AND [2] has NOT been evaluated by physician for this  Answer Assessment - Initial Assessment Questions 1. DESCRIPTION: "Describe your dizziness."     Room spinning 2. VERTIGO: "Do you feel like either you or the room is spinning or tilting?"      spinning 3. LIGHTHEADED: "Do you feel lightheaded?" (e.g., somewhat faint, woozy, weak upon standing)     No Im not reeling faint  4. SEVERITY: "How bad is it?"  "Can you walk?"   - MILD - Feels unsteady but walking normally.   - MODERATE - Feels very unsteady when walking, but not falling; interferes with normal activities (e.g., school, work) .   - SEVERE - Unable to walk without falling (requires assistance).     Moderate 5. ONSET:  "When did the dizziness begin?"     12/09/17 6. AGGRAVATING FACTORS: "Does anything make it worse?" (e.g., standing, change in head position)     Standing yanning 7. CAUSE: "What do you think is causing the dizziness?"     Sinus congestion taking musinex 8. RECURRENT SYMPTOM: "Have you had dizziness before?" If so, ask: "When was the last time?" "What happened that time?"     no 9. OTHER SYMPTOMS: "Do you have any other symptoms?" (e.g., headache, weakness, numbness, vomiting, earache)     Antibiotics a month ago for sinus infection left ear. feel like rt ear  infection this time 10. PREGNANCY: "Is there any chance you are pregnant?" "When was your last menstrual period?"       no  Protocols used: DIZZINESS - VERTIGO-A-AH

## 2017-12-14 ENCOUNTER — Encounter: Payer: Self-pay | Admitting: Family

## 2017-12-14 ENCOUNTER — Ambulatory Visit (INDEPENDENT_AMBULATORY_CARE_PROVIDER_SITE_OTHER): Payer: 59 | Admitting: Family

## 2017-12-14 VITALS — BP 124/84 | HR 64 | Temp 98.1°F | Resp 18 | Ht 62.0 in | Wt 160.8 lb

## 2017-12-14 DIAGNOSIS — Z23 Encounter for immunization: Secondary | ICD-10-CM

## 2017-12-14 DIAGNOSIS — H8110 Benign paroxysmal vertigo, unspecified ear: Secondary | ICD-10-CM | POA: Diagnosis not present

## 2017-12-14 MED ORDER — MECLIZINE HCL 12.5 MG PO TABS: 12.5000 mg | ORAL_TABLET | Freq: Three times a day (TID) | ORAL | 0 refills | Status: DC | PRN

## 2017-12-14 MED FILL — MECLIZINE 12.5 MG CAPLET: 12.5 | 33 days supply | Qty: 100 | Fill #0

## 2017-12-14 NOTE — Progress Notes (Signed)
Subjective:    Patient ID: [name], female    DOB: January 21, 1951, 66 y.o.   MRN: [id number]  HPI  Patient is a 67 yr old female who presents today with chief complaint of dizziness. She reports that symptoms began on 9/13.  Worse with positional changes.  Reports some nasal congestion/congestion in throat as well which she has been taking mucinex for.   Denies associated nausea/vomitting with dizziness.   Review of Systems See HPI  Past Medical History:  Diagnosis Date  . Allergy    seasonal  . Arachnoiditis   . Depression   . Elevated liver enzymes   . Environmental allergies   . GERD (gastroesophageal reflux disease)   . H/O being hospitalized 03/2012   x1 week for nausea  . History of chicken pox   . Migraine with typical aura    resolved years ago  . Nausea    chronic nausea      Social History   Socioeconomic History  . Marital status: Married    Spouse name: Not on file  . Number of children: Not on file  . Years of education: Not on file  . Highest education level: Not on file  Occupational History  . Not on file  Social Needs  . Financial resource strain: Not on file  . Food insecurity:    Worry: Not on file    Inability: Not on file  . Transportation needs:    Medical: Not on file    Non-medical: Not on file  Tobacco Use  . Smoking status: Former Smoker    Last attempt to quit: 08/09/1986    Years since quitting: 31.3  . Smokeless tobacco: Never Used  Substance and Sexual Activity  . Alcohol use: No  . Drug use: No  . Sexual activity: Not Currently    Partners: Male    Birth control/protection: Other-see comments    Comment: husband had vasectomy.   Lifestyle  . Physical activity:    Days per week: Not on file    Minutes per session: Not on file  . Stress: Not on file  Relationships  . Social connections:    Talks on phone: Not on file    Gets together: Not on file    Attends religious service: Not on file    Active member of club or  organization: Not on file    Attends meetings of clubs or organizations: Not on file    Relationship status: Not on file  . Intimate partner violence:    Fear of current or ex partner: Not on file    Emotionally abused: Not on file    Physically abused: Not on file    Forced sexual activity: Not on file  Other Topics Concern  . Not on file  Social History Narrative   2 step daughters- 27 and 28   She worked as an Web designer.   Enjoys puzzles, counted cross stitch.   Completed 1 year of college   4 cats    Past Surgical History:  Procedure Laterality Date  . ABDOMINOPLASTY  07/11/09  . EUS N/A 05/05/2012   Procedure: UPPER ENDOSCOPIC ULTRASOUND (EUS) LINEAR;  Surgeon: Beryle Beams, MD;  Location: [facility];  Service: Endoscopy;  Laterality: N/A;  . LAPAROSCOPIC CHOLECYSTECTOMY  2010  . NASAL SINUS SURGERY     "Has had 2-3 surgeries over the years"    Family History  Problem Relation Age of Onset  . Alzheimer's disease Mother   .  Hypertension Mother   . Migraines Mother   . Hypertension Father   . Other Father        ?carotid artery aneurysm  . Cancer Brother        mouth and throat    Allergies  Allergen Reactions  . Compazine [Prochlorperazine]     hyperactivity  . Morphine And Related Hives and Itching  . Trintellix [Vortioxetine] Nausea And Vomiting    Current Outpatient Medications on File Prior to Visit  Medication Sig Dispense Refill  . amphetamine-dextroamphetamine (ADDERALL) 20 MG tablet Take 20 mg by mouth daily.    . clonazePAM (KLONOPIN) 1 MG tablet TAKE 1 TABLET BY MOUTH THREE TIMES A DAY AS NEEDED FOR ANXIETY 90 tablet 0  . loratadine-pseudoephedrine (CLARITIN-D 24-HOUR) 10-240 MG 24 hr tablet Take 1 tablet by mouth daily.    . mirtazapine (REMERON) 15 MG tablet Take 15 mg by mouth at bedtime.  12  . ondansetron (ZOFRAN-ODT) 8 MG disintegrating tablet DISSOLVE 1 TABLET BY MOUTH EVERY 8 HOURS AS NEEDED FOR NAUSEA OR VOMITING 90 tablet  0  . PARoxetine (PAXIL) 40 MG tablet TAKE 1 TABLET BY MOUTH  EVERY MORNING 90 tablet 0   No current facility-administered medications on file prior to visit.     BP 124/84 (BP Location: Right Arm, Cuff Size: Normal)   Pulse 64   Temp 98.1 F (36.7 C) (Oral)   Resp 18   Ht 5\' 2"  (1.575 m)   Wt 160 lb 12.8 oz (72.9 kg)   LMP 12/28/2011   SpO2 97%   BMI 29.41 kg/m       Objective:   Physical Exam  Constitutional: She is oriented to person, place, and time. She appears well-developed and well-nourished.  HENT:  Right Ear: External ear and ear canal normal.  Left Ear: Ear canal normal.  Mouth/Throat: No oropharyngeal exudate or posterior oropharyngeal edema.  Eyes: EOM are normal.  Cardiovascular: Normal rate, regular rhythm and normal heart sounds.  No murmur heard. Pulmonary/Chest: Effort normal and breath sounds normal. No respiratory distress. She has no wheezes.  Musculoskeletal: She exhibits no edema.  Neurological: She is alert and oriented to person, place, and time. She exhibits normal muscle tone.  + hall pike dix left, minimal nystagmus but increased dizzies.  Neg on right  Psychiatric: She has a normal mood and affect. Her behavior is normal. Judgment and thought content normal.          Assessment & Plan:  BPV- she had some mild orthostasis as well. Recommended that she push fluids, use meclizine prn, and call if symptoms worsen/fail to improve in the next few weeks.  Discussed red flags for stroke symptoms and she understands.  Flu shot today

## 2017-12-14 NOTE — Patient Instructions (Signed)
You may use meclizine as needed for dizziness. Call if symptoms worsen or if not improved in a few weeks.

## 2017-12-20 ENCOUNTER — Other Ambulatory Visit: Payer: Self-pay | Admitting: Family

## 2018-01-20 ENCOUNTER — Encounter: Payer: Self-pay | Admitting: Family

## 2018-01-20 ENCOUNTER — Ambulatory Visit (INDEPENDENT_AMBULATORY_CARE_PROVIDER_SITE_OTHER): Payer: 59 | Admitting: Family

## 2018-01-20 VITALS — BP 120/76 | HR 92 | Temp 98.6°F | Resp 16 | Ht 62.0 in | Wt 159.0 lb

## 2018-01-20 DIAGNOSIS — J329 Chronic sinusitis, unspecified: Secondary | ICD-10-CM

## 2018-01-20 DIAGNOSIS — Z0000 Encounter for general adult medical examination without abnormal findings: Secondary | ICD-10-CM

## 2018-01-20 MED ORDER — AMOXICILLIN-POT CLAVULANATE 875-125 MG PO TABS: 1.0000 | ORAL_TABLET | Freq: Two times a day (BID) | ORAL | 0 refills | Status: DC

## 2018-01-20 MED FILL — AMOX-CLAV 875-125 MG TABLET: 875-125 | 10 days supply | Qty: 20 | Fill #0

## 2018-01-20 NOTE — Progress Notes (Signed)
Subjective:    Patient ID: [name], female    DOB: Dec 27, 1950, 67 y.o.   MRN: [id number]  HPI  Patient is a 67 yr old female who presents today with chief complaint of nasal congestion. Symptoms began 2 weeks ago. Has associated facial pain. Had mild scratchy throat, ears blocked mild sore throat.  Tried dayquil/nyquil, allegra, claritin, equate without significant improvement in her symptoms.  Reports + pain in her face.  No nasal discharge but feels post nasal drip.  Notes that she did have diarrhea all morning.  Took immodium before she left the house.     Review of Systems See HPI  Past Medical History:  Diagnosis Date  . Allergy    seasonal  . Arachnoiditis   . Depression   . Elevated liver enzymes   . Environmental allergies   . GERD (gastroesophageal reflux disease)   . H/O being hospitalized 03/2012   x1 week for nausea  . History of chicken pox   . Migraine with typical aura    resolved years ago  . Nausea    chronic nausea      Social History   Socioeconomic History  . Marital status: Married    Spouse name: Not on file  . Number of children: Not on file  . Years of education: Not on file  . Highest education level: Not on file  Occupational History  . Not on file  Social Needs  . Financial resource strain: Not on file  . Food insecurity:    Worry: Not on file    Inability: Not on file  . Transportation needs:    Medical: Not on file    Non-medical: Not on file  Tobacco Use  . Smoking status: Former Smoker    Last attempt to quit: 08/09/1986    Years since quitting: 31.4  . Smokeless tobacco: Never Used  Substance and Sexual Activity  . Alcohol use: No  . Drug use: No  . Sexual activity: Not Currently    Partners: Male    Birth control/protection: Other-see comments    Comment: husband had vasectomy.   Lifestyle  . Physical activity:    Days per week: Not on file    Minutes per session: Not on file  . Stress: Not on file  Relationships    . Social connections:    Talks on phone: Not on file    Gets together: Not on file    Attends religious service: Not on file    Active member of club or organization: Not on file    Attends meetings of clubs or organizations: Not on file    Relationship status: Not on file  . Intimate partner violence:    Fear of current or ex partner: Not on file    Emotionally abused: Not on file    Physically abused: Not on file    Forced sexual activity: Not on file  Other Topics Concern  . Not on file  Social History Narrative   2 step daughters- 26 and 66   She worked as an Web designer.   Enjoys puzzles, counted cross stitch.   Completed 1 year of college   4 cats    Past Surgical History:  Procedure Laterality Date  . ABDOMINOPLASTY  07/11/09  . EUS N/A 05/05/2012   Procedure: UPPER ENDOSCOPIC ULTRASOUND (EUS) LINEAR;  Surgeon: Beryle Beams, MD;  Location: [facility];  Service: Endoscopy;  Laterality: N/A;  . LAPAROSCOPIC CHOLECYSTECTOMY  2010  .  NASAL SINUS SURGERY     "Has had 2-3 surgeries over the years"    Family History  Problem Relation Age of Onset  . Alzheimer's disease Mother   . Hypertension Mother   . Migraines Mother   . Hypertension Father   . Other Father        ?carotid artery aneurysm  . Cancer Brother        mouth and throat    Allergies  Allergen Reactions  . Compazine [Prochlorperazine]     hyperactivity  . Morphine And Related Hives and Itching  . Trintellix [Vortioxetine] Nausea And Vomiting    Current Outpatient Medications on File Prior to Visit  Medication Sig Dispense Refill  . amphetamine-dextroamphetamine (ADDERALL) 20 MG tablet Take 20 mg by mouth daily.    . clonazePAM (KLONOPIN) 1 MG tablet TAKE 1 TABLET BY MOUTH THREE TIMES A DAY AS NEEDED FOR ANXIETY 90 tablet 0  . meclizine (ANTIVERT) 12.5 MG tablet Take 1 tablet (12.5 mg total) by mouth 3 (three) times daily as needed for dizziness. 30 tablet 0  . mirtazapine (REMERON)  15 MG tablet Take 15 mg by mouth at bedtime.  12  . ondansetron (ZOFRAN-ODT) 8 MG disintegrating tablet DISSOLVE 1 TABLET BY MOUTH EVERY 8 HOURS AS NEEDED FOR NAUSEA OR VOMITING 90 tablet 0  . PARoxetine (PAXIL) 40 MG tablet Take 1 tablet (40 mg total) by mouth every morning. 90 tablet 1  . loratadine-pseudoephedrine (CLARITIN-D 24-HOUR) 10-240 MG 24 hr tablet Take 1 tablet by mouth daily.     No current facility-administered medications on file prior to visit.     BP 120/76 (BP Location: Right Arm, Patient Position: Sitting, Cuff Size: Small)   Pulse 92   Temp 98.6 F (37 C) (Oral)   Resp 16   Ht 5\' 2"  (1.575 m)   Wt 159 lb (72.1 kg)   LMP 12/28/2011   SpO2 96%   BMI 29.08 kg/m       Objective:   Physical Exam  Constitutional: She appears well-developed and well-nourished.  HENT:  Head: Normocephalic and atraumatic.  Right Ear: Hearing and tympanic membrane normal.  Left Ear: Hearing and tympanic membrane normal.  Nose: Right sinus exhibits maxillary sinus tenderness and frontal sinus tenderness. Left sinus exhibits maxillary sinus tenderness and frontal sinus tenderness.  Mouth/Throat: No oropharyngeal exudate, posterior oropharyngeal edema or posterior oropharyngeal erythema.  Neck: Neck supple.  Cardiovascular: Normal rate, regular rhythm and normal heart sounds.  No murmur heard. Pulmonary/Chest: Effort normal and breath sounds normal. No respiratory distress. She has no wheezes.  Lymphadenopathy:    She has no cervical adenopathy.  Psychiatric: She has a normal mood and affect. Her behavior is normal. Judgment and thought content normal.          Assessment & Plan:  Sinusitis- pt is advised as follows:  Start augmentin twice daily. Use netipot once daily followed by flonase 2 sprays each nostril  Once daily. Call if new/worsening symptoms or if not improved in 3-4 days.  Of note she complains of "bumps or vagina" and dyspareunia. Declines pelvic exam today or  GYN referral- will schedule for another day.

## 2018-01-20 NOTE — Patient Instructions (Signed)
Start augmentin twice daily. Use netipot once daily followed by flonase 2 sprays each nostril  Once daily. Call if new/worsening symptoms or if not improved in 3-4 days. Schedule pelvic at your convenience.

## 2018-02-02 ENCOUNTER — Ambulatory Visit (HOSPITAL_BASED_OUTPATIENT_CLINIC_OR_DEPARTMENT_OTHER)
Admission: RE | Admit: 2018-02-02 | Discharge: 2018-02-02 | Disposition: A | Discharge status: Home / Self Care | Payer: 59 | Source: Ambulatory Visit | Attending: Medical | Admitting: Medical

## 2018-02-02 ENCOUNTER — Ambulatory Visit (INDEPENDENT_AMBULATORY_CARE_PROVIDER_SITE_OTHER): Payer: 59 | Admitting: Medical

## 2018-02-02 ENCOUNTER — Other Ambulatory Visit: Payer: Self-pay

## 2018-02-02 ENCOUNTER — Encounter: Payer: Self-pay | Admitting: Medical

## 2018-02-02 ENCOUNTER — Ambulatory Visit (HOSPITAL_BASED_OUTPATIENT_CLINIC_OR_DEPARTMENT_OTHER)
Admission: RE | Admit: 2018-02-02 | Discharge: 2018-02-02 | Disposition: A | Discharge status: Home / Self Care | Payer: 59 | Source: Ambulatory Visit | Attending: Family | Admitting: Family

## 2018-02-02 VITALS — BP 132/80 | HR 77 | Temp 98.3°F | Wt 162.0 lb

## 2018-02-02 DIAGNOSIS — R05 Cough: Secondary | ICD-10-CM

## 2018-02-02 DIAGNOSIS — Z0000 Encounter for general adult medical examination without abnormal findings: Secondary | ICD-10-CM

## 2018-02-02 DIAGNOSIS — Z1231 Encounter for screening mammogram for malignant neoplasm of breast: Secondary | ICD-10-CM | POA: Diagnosis not present

## 2018-02-02 DIAGNOSIS — R059 Cough, unspecified: Secondary | ICD-10-CM

## 2018-02-02 DIAGNOSIS — J3489 Other specified disorders of nose and nasal sinuses: Secondary | ICD-10-CM | POA: Diagnosis not present

## 2018-02-02 DIAGNOSIS — R0989 Other specified symptoms and signs involving the circulatory and respiratory systems: Secondary | ICD-10-CM | POA: Diagnosis not present

## 2018-02-02 DIAGNOSIS — J309 Allergic rhinitis, unspecified: Secondary | ICD-10-CM | POA: Diagnosis not present

## 2018-02-02 MED ORDER — MONTELUKAST SODIUM 10 MG PO TABS: 10.0000 mg | ORAL_TABLET | Freq: Every day | ORAL | 3 refills | Status: DC

## 2018-02-02 MED ORDER — METHYLPREDNISOLONE ACETATE 40 MG/ML IJ SUSP
40.0000 mg | Freq: Once | INTRAMUSCULAR | Status: AC
  Administered 2018-02-02: 40 mg via INTRAMUSCULAR

## 2018-02-02 MED ORDER — AZITHROMYCIN 250 MG PO TABS: ORAL_TABLET | ORAL | 0 refills | Status: DC

## 2018-02-02 MED FILL — MONTELUKAST SOD 10 MG TAB: 10 | 30 days supply | Qty: 30 | Fill #0

## 2018-02-02 MED FILL — AZITHROMYCIN 250 MG TABLET: 250 | 5 days supply | Qty: 6 | Fill #0

## 2018-02-02 NOTE — Patient Instructions (Signed)
You do have some minimal persisting sinus pressure presently.  Possible residual infection present.  Will prescribe a azithromycin.  This has some bronchitis coverage as well.  Since you has had symptoms for 2 weeks with occasional cough and moderate fatigue, I do think it is a good idea to get chest x-ray today to rule out any walking pneumonia.  For your year-round allergies, recommend you continue Allegra and I prescribed montelukast.  Also I do think Depo-Medrol injection would be helpful today as you probably did have component of allergy flare recently.  Follow-up in 7 to 10 days or as needed.

## 2018-02-02 NOTE — Progress Notes (Signed)
Pt. taking excedrin, tylenol, and ibupforen alternating for sx.

## 2018-02-02 NOTE — Progress Notes (Signed)
Subjective:    Patient ID: [name], female    DOB: 28-Aug-1950, 67 y.o.   MRN: [id number]  HPI  Pt in for evaluation. Pt has hoarse voice, nasal congestion, sneezing, pnd and some chest congestion.(overall sick for 2 weeks or more)  Pt states augmentin seemed to take pressure off her sinus.(but not completely)  But she states still has above symptoms and is tired. No fever, no chills or sweats.  Typically has allergies all year.  Pt is not diabetic.   Review of Systems  Constitutional: Negative for chills, fatigue and fever.  HENT: Positive for congestion, postnasal drip, sinus pressure, sinus pain and sneezing.   Respiratory: Positive for cough. Negative for chest tightness, shortness of breath and wheezing.   Cardiovascular: Negative for chest pain and palpitations.  Gastrointestinal: Negative for abdominal pain.  Musculoskeletal: Negative for back pain and myalgias.  Skin: Negative for rash.  Neurological: Negative for dizziness, seizures and light-headedness.  Hematological: Negative for adenopathy. Does not bruise/bleed easily.  Psychiatric/Behavioral: Negative for behavioral problems.    Past Medical History:  Diagnosis Date  . Allergy    seasonal  . Arachnoiditis   . Depression   . Elevated liver enzymes   . Environmental allergies   . GERD (gastroesophageal reflux disease)   . H/O being hospitalized 03/2012   x1 week for nausea  . History of chicken pox   . Migraine with typical aura    resolved years ago  . Nausea    chronic nausea      Social History   Socioeconomic History  . Marital status: Married    Spouse name: Not on file  . Number of children: Not on file  . Years of education: Not on file  . Highest education level: Not on file  Occupational History  . Not on file  Social Needs  . Financial resource strain: Not on file  . Food insecurity:    Worry: Not on file    Inability: Not on file  . Transportation needs:    Medical: Not on  file    Non-medical: Not on file  Tobacco Use  . Smoking status: Former Smoker    Last attempt to quit: 08/09/1986    Years since quitting: 31.5  . Smokeless tobacco: Never Used  Substance and Sexual Activity  . Alcohol use: No  . Drug use: No  . Sexual activity: Not Currently    Partners: Male    Birth control/protection: Other-see comments    Comment: husband had vasectomy.   Lifestyle  . Physical activity:    Days per week: Not on file    Minutes per session: Not on file  . Stress: Not on file  Relationships  . Social connections:    Talks on phone: Not on file    Gets together: Not on file    Attends religious service: Not on file    Active member of club or organization: Not on file    Attends meetings of clubs or organizations: Not on file    Relationship status: Not on file  . Intimate partner violence:    Fear of current or ex partner: Not on file    Emotionally abused: Not on file    Physically abused: Not on file    Forced sexual activity: Not on file  Other Topics Concern  . Not on file  Social History Narrative   2 step daughters- 5 and 82   She worked as an Web designer.  Enjoys puzzles, counted cross stitch.   Completed 1 year of college   4 cats    Past Surgical History:  Procedure Laterality Date  . ABDOMINOPLASTY  07/11/09  . EUS N/A 05/05/2012   Procedure: UPPER ENDOSCOPIC ULTRASOUND (EUS) LINEAR;  Surgeon: Beryle Beams, MD;  Location: [facility];  Service: Endoscopy;  Laterality: N/A;  . LAPAROSCOPIC CHOLECYSTECTOMY  2010  . NASAL SINUS SURGERY     "Has had 2-3 surgeries over the years"    Family History  Problem Relation Age of Onset  . Alzheimer's disease Mother   . Hypertension Mother   . Migraines Mother   . Hypertension Father   . Other Father        ?carotid artery aneurysm  . Cancer Brother        mouth and throat    Allergies  Allergen Reactions  . Compazine [Prochlorperazine]     hyperactivity  . Morphine  And Related Hives and Itching  . Trintellix [Vortioxetine] Nausea And Vomiting    Current Outpatient Medications on File Prior to Visit  Medication Sig Dispense Refill  . amphetamine-dextroamphetamine (ADDERALL) 20 MG tablet Take 20 mg by mouth daily.    . clonazePAM (KLONOPIN) 1 MG tablet TAKE 1 TABLET BY MOUTH THREE TIMES A DAY AS NEEDED FOR ANXIETY 90 tablet 0  . meclizine (ANTIVERT) 12.5 MG tablet Take 1 tablet (12.5 mg total) by mouth 3 (three) times daily as needed for dizziness. 30 tablet 0  . mirtazapine (REMERON) 15 MG tablet Take 15 mg by mouth at bedtime.  12  . ondansetron (ZOFRAN-ODT) 8 MG disintegrating tablet DISSOLVE 1 TABLET BY MOUTH EVERY 8 HOURS AS NEEDED FOR NAUSEA OR VOMITING 90 tablet 0  . PARoxetine (PAXIL) 40 MG tablet Take 1 tablet (40 mg total) by mouth every morning. 90 tablet 1   No current facility-administered medications on file prior to visit.     BP 132/80 (BP Location: Right Arm, Patient Position: Sitting, Cuff Size: Normal)   Pulse 77   Temp 98.3 F (36.8 C)   Wt 162 lb (73.5 kg)   LMP 12/28/2011   SpO2 97%   BMI 29.63 kg/m       Objective:   Physical Exam  General  Mental Status - Alert. General Appearance - Well groomed. Not in acute distress.  Skin Rashes- No Rashes.  HEENT Head- Normal. Ear Auditory Canal - Left- Normal. Right - Normal.Tympanic Membrane- Left- Normal. Right- Normal. Eye Sclera/Conjunctiva- Left- Normal. Right- Normal. Nose & Sinuses Nasal Mucosa- Left-  Boggy and Congested. Right-  Boggy and  Congested.Bilateral  Faint maxillary pressure but no frontal sinus pressure. Mouth & Throat Lips: Upper Lip- Normal: no dryness, cracking, pallor, cyanosis, or vesicular eruption. Lower Lip-Normal: no dryness, cracking, pallor, cyanosis or vesicular eruption. Buccal Mucosa- Bilateral- No Aphthous ulcers. Oropharynx- No Discharge or Erythema. +pnd. Tonsils: Characteristics- Bilateral- No Erythema or Congestion.  Size/Enlargement- Bilateral- No enlargement. Discharge- bilateral-None.  Neck Neck- Supple. No Masses.   Chest and Lung Exam Auscultation: Breath Sounds:-Clear even and unlabored. Mild shallow respirations.  Cardiovascular Auscultation:Rythm- Regular, rate and rhythm. Murmurs & Other Heart Sounds:Ausculatation of the heart reveal- No Murmurs.  Lymphatic Head & Neck General Head & Neck Lymphatics: Bilateral: Description- No Localized lymphadenopathy.       Assessment & Plan:  You do have some minimal persisting sinus pressure presently.  Possible residual infection present.  Will prescribe a azithromycin.  This has some bronchitis coverage as well.  Since you has  had symptoms for 2 weeks with occasional cough and moderate fatigue, I do think it is a good idea to get chest x-ray today to rule out any walking pneumonia.  For your year-round allergies, recommend you continue Allegra and I prescribed montelukast.  Also I do think Depo-Medrol injection would be helpful today as you probably did have component of allergy flare recently.  Follow-up in 7 to 10 days or as needed.  Mackie Pai, PA-C

## 2018-02-07 ENCOUNTER — Encounter: Payer: Self-pay | Admitting: Medical

## 2018-02-08 ENCOUNTER — Encounter: Payer: Self-pay | Admitting: Family

## 2018-02-10 ENCOUNTER — Encounter: Payer: Self-pay | Admitting: Family Medicine

## 2018-02-10 ENCOUNTER — Ambulatory Visit: Payer: Self-pay

## 2018-02-10 ENCOUNTER — Ambulatory Visit (INDEPENDENT_AMBULATORY_CARE_PROVIDER_SITE_OTHER): Payer: 59 | Admitting: Family Medicine

## 2018-02-10 VITALS — BP 148/94 | HR 86 | Temp 98.0°F | Ht 62.0 in | Wt 158.0 lb

## 2018-02-10 DIAGNOSIS — R1013 Epigastric pain: Secondary | ICD-10-CM

## 2018-02-10 DIAGNOSIS — K219 Gastro-esophageal reflux disease without esophagitis: Secondary | ICD-10-CM

## 2018-02-10 MED ORDER — OMEPRAZOLE 40 MG PO CPDR: 40.0000 mg | DELAYED_RELEASE_CAPSULE | Freq: Every day | ORAL | 3 refills | Status: DC

## 2018-02-10 NOTE — Progress Notes (Signed)
[name] is a 67 y.o. female  Chief Complaint  Patient presents with  . Abdominal Pain    for x2 days , epigastric area , took 2 gas x . no urinary problems     HPI: [name] is a 67 y.o. female who is a patient of [name]. She is here for a same day visit and complains of 2 day h/o epigastric abdominal pain. Pain comes/goes.  No fever, chills. No vomiting, diarrhea. Pt does have a h/o GERD and chronic nausea. She was on omeprazole in the past but no longer takes it because "the symptoms seemed to have been controlled." She states in the past 2 mo symptoms have worsened and are now daily. She has modified her diet but she still experiences symptoms.  No urinary symptoms.  She tried taking 2 Gas-Ex tabs this AM without much change. She tried Tums and Pepto-bismol yesterday but without significant relief. She regularly takes probiotics and digestive enzymes. She has also takes 2 senna at bedtime to help prevent constipation.   She had a cholecystectomy in 2010.  Past Medical History:  Diagnosis Date  . Allergy    seasonal  . Arachnoiditis   . Depression   . Elevated liver enzymes   . Environmental allergies   . GERD (gastroesophageal reflux disease)   . H/O being hospitalized 03/2012   x1 week for nausea  . History of chicken pox   . Migraine with typical aura    resolved years ago  . Nausea    chronic nausea     Past Surgical History:  Procedure Laterality Date  . ABDOMINOPLASTY  07/11/09  . EUS N/A 05/05/2012   Procedure: UPPER ENDOSCOPIC ULTRASOUND (EUS) LINEAR;  Surgeon: Beryle Beams, MD;  Location: [facility];  Service: Endoscopy;  Laterality: N/A;  . LAPAROSCOPIC CHOLECYSTECTOMY  2010  . NASAL SINUS SURGERY     "Has had 2-3 surgeries over the years"    Social History   Socioeconomic History  . Marital status: Married    Spouse name: Not on file  . Number of children: Not on file  . Years of education: Not on file  . Highest education level:  Not on file  Occupational History  . Not on file  Social Needs  . Financial resource strain: Not on file  . Food insecurity:    Worry: Not on file    Inability: Not on file  . Transportation needs:    Medical: Not on file    Non-medical: Not on file  Tobacco Use  . Smoking status: Former Smoker    Last attempt to quit: 08/09/1986    Years since quitting: 31.5  . Smokeless tobacco: Never Used  Substance and Sexual Activity  . Alcohol use: No  . Drug use: No  . Sexual activity: Not Currently    Partners: Male    Birth control/protection: Other-see comments    Comment: husband had vasectomy.   Lifestyle  . Physical activity:    Days per week: Not on file    Minutes per session: Not on file  . Stress: Not on file  Relationships  . Social connections:    Talks on phone: Not on file    Gets together: Not on file    Attends religious service: Not on file    Active member of club or organization: Not on file    Attends meetings of clubs or organizations: Not on file    Relationship status: Not on  file  . Intimate partner violence:    Fear of current or ex partner: Not on file    Emotionally abused: Not on file    Physically abused: Not on file    Forced sexual activity: Not on file  Other Topics Concern  . Not on file  Social History Narrative   2 step daughters- 72 and 57   She worked as an Web designer.   Enjoys puzzles, counted cross stitch.   Completed 1 year of college   4 cats    Family History  Problem Relation Age of Onset  . Alzheimer's disease Mother   . Hypertension Mother   . Migraines Mother   . Hypertension Father   . Other Father        ?carotid artery aneurysm  . Cancer Brother        mouth and throat     Immunization History  Administered Date(s) Administered  . Influenza, High Dose Seasonal PF 01/09/2016, 12/15/2016, 12/14/2017  . Influenza,inj,Quad PF,6+ Mos 12/25/2013, 12/13/2014  . Pneumococcal Polysaccharide-23 12/13/2014  .  Td 06/06/2008  . Zoster 01/15/2011    Outpatient Encounter Medications as of 02/10/2018  Medication Sig  . amphetamine-dextroamphetamine (ADDERALL) 20 MG tablet Take 20 mg by mouth daily.  . clonazePAM (KLONOPIN) 1 MG tablet TAKE 1 TABLET BY MOUTH THREE TIMES A DAY AS NEEDED FOR ANXIETY  . mirtazapine (REMERON) 15 MG tablet Take 15 mg by mouth at bedtime.  . montelukast (SINGULAIR) 10 MG tablet Take 1 tablet (10 mg total) by mouth at bedtime.  . ondansetron (ZOFRAN-ODT) 8 MG disintegrating tablet DISSOLVE 1 TABLET BY MOUTH EVERY 8 HOURS AS NEEDED FOR NAUSEA OR VOMITING  . PARoxetine (PAXIL) 40 MG tablet Take 1 tablet (40 mg total) by mouth every morning.  . [DISCONTINUED] azithromycin (ZITHROMAX) 250 MG tablet Take 2 tablets by mouth on day 1, followed by 1 tablet by mouth daily for 4 days. (Patient not taking: Reported on 02/10/2018)  . [DISCONTINUED] meclizine (ANTIVERT) 12.5 MG tablet Take 1 tablet (12.5 mg total) by mouth 3 (three) times daily as needed for dizziness. (Patient not taking: Reported on 02/10/2018)   No facility-administered encounter medications on file as of 02/10/2018.      ROS: Gen: no fever, chills  Skin: no rash, itching ENT: no ear pain, ear drainage, nasal congestion, rhinorrhea, sinus pressure, sore throat Eyes: no blurry vision, double vision Resp: no cough, wheeze,SOB CV: no CP, palpitations, LE edema,  GI: no heartburn, n/v/d/c, abd pain GU: no dysuria, urgency, frequency, hematuria  MSK: no joint pain, myalgias, back pain Neuro: no dizziness, headache, weakness, vertigo Psych: no depression, anxiety, insomnia   Allergies  Allergen Reactions  . Compazine [Prochlorperazine]     hyperactivity  . Morphine And Related Hives and Itching  . Trintellix [Vortioxetine] Nausea And Vomiting    BP (!) 148/94 (BP Location: Right Arm, Patient Position: Sitting, Cuff Size: Normal)   Pulse 86   Temp 98 F (36.7 C) (Oral)   Ht 5\' 2"  (1.575 m)   Wt 158 lb  (71.7 kg)   LMP 12/28/2011   SpO2 95%   BMI 28.90 kg/m   BP Readings from Last 3 Encounters:  02/10/18 (!) 148/94  02/02/18 132/80  01/20/18 120/76   Physical Exam  Constitutional: She is oriented to person, place, and time. She appears well-developed and well-nourished. No distress.  Pulmonary/Chest: Breath sounds normal. No respiratory distress. She has no wheezes. She has no rhonchi.  Abdominal: Soft. Bowel  sounds are normal. She exhibits no distension and no mass. There is no splenomegaly or hepatomegaly. There is tenderness ( epigastric TTP) in the epigastric area. There is no rebound, no guarding and no CVA tenderness.    Neurological: She is alert and oriented to person, place, and time.  Skin: Skin is warm and dry.  Psychiatric: She has a normal mood and affect. Her behavior is normal.    A/P:  1. Gastroesophageal reflux disease, esophagitis presence not specified 2. Epigastric abdominal pain - intermittent epigastric pain x 2 days, h/o GERD but off omeprazole x months with increased GERD symptoms x 2 mo - no improvement with very modified/restricted diet, multiple OTC meds Restart: - omeprazole (PRILOSEC) 40 MG capsule; Take 1 capsule (40 mg total) by mouth daily.  Dispense: 90 capsule; Refill: 3 - if no or minimal improvement in 2 weeks, would consider gastric ulcer, r/o h pylori, consider GI referral as pt notes chronic h/o GERD and she may be candidate for TIF endoscopy procedure Discussed plan and reviewed medications with patient, including risks, benefits, and potential side effects. Pt expressed understand. All questions answered.

## 2018-02-10 NOTE — Telephone Encounter (Signed)
Noted. Thanks.

## 2018-02-10 NOTE — Telephone Encounter (Signed)
Pt. Reports 2 days of abdominal pain.Midline above her belly button. Comes and goes. Hurts worse after eating. Has had gallbladder removed. "It feels just like my gallbladder pain before surgery." Some nausea. Denies chest pain, radiation of pain. Has some shortness of breath at times.Belching relieves the "pressure." No availability at [facility]. Appointment made at [facility].  Reason for Disposition . [1] MODERATE pain (e.g., interferes with normal activities) AND [2] pain comes and goes (cramps) AND [3] present > 24 hours  (Exception: pain with Vomiting or Diarrhea - see that Guideline)  Answer Assessment - Initial Assessment Questions 1. LOCATION: "Where does it hurt?"      Middle of belly - above belly button and below breast 2. RADIATION: "Does the pain shoot anywhere else?" (e.g., chest, back)     Shoots up into her chest 3. ONSET: "When did the pain begin?" (e.g., minutes, hours or days ago)       2 days agp 4. SUDDEN: "Gradual or sudden onset?"     Sudden 5. PATTERN "Does the pain come and go, or is it constant?"    - If constant: "Is it getting better, staying the same, or worsening?"      (Note: Constant means the pain never goes away completely; most serious pain is constant and it progresses)     - If intermittent: "How long does it last?" "Do you have pain now?"     (Note: Intermittent means the pain goes away completely between bouts)     Comes and goes 6. SEVERITY: "How bad is the pain?"  (e.g., Scale 1-10; mild, moderate, or severe)   - MILD (1-3): doesn't interfere with normal activities, abdomen soft and not tender to touch    - MODERATE (4-7): interferes with normal activities or awakens from sleep, tender to touch    - SEVERE (8-10): excruciating pain, doubled over, unable to do any normal activities        10 7. RECURRENT SYMPTOM: "Have you ever had this type of abdominal pain before?" If so, ask: "When was the last time?" and "What happened that time?"      No 8.  CAUSE: "What do you think is causing the abdominal pain?"     Unsure 9. RELIEVING/AGGRAVATING FACTORS: "What makes it better or worse?" (e.g., movement, antacids, bowel movement)     Tried Pepto, antacids 10. OTHER SYMPTOMS: "Has there been any vomiting, diarrhea, constipation, or urine problems?"       Shortness of breath at times. Pain is worse after eating 11. PREGNANCY: "Is there any chance you are pregnant?" "When was your last menstrual period?"       No  Protocols used: ABDOMINAL PAIN - [facility]

## 2018-02-17 ENCOUNTER — Encounter: Payer: Self-pay | Admitting: Family

## 2018-02-17 ENCOUNTER — Ambulatory Visit (INDEPENDENT_AMBULATORY_CARE_PROVIDER_SITE_OTHER): Payer: 59 | Admitting: Family

## 2018-02-17 VITALS — BP 166/80 | HR 82 | Temp 98.6°F | Ht 61.5 in | Wt 160.8 lb

## 2018-02-17 DIAGNOSIS — Z23 Encounter for immunization: Secondary | ICD-10-CM

## 2018-02-17 DIAGNOSIS — L723 Sebaceous cyst: Secondary | ICD-10-CM | POA: Diagnosis not present

## 2018-02-17 DIAGNOSIS — I10 Essential (primary) hypertension: Secondary | ICD-10-CM | POA: Diagnosis not present

## 2018-02-17 MED ORDER — AMLODIPINE BESYLATE 5 MG PO TABS: 5.0000 mg | ORAL_TABLET | Freq: Every day | ORAL | 2 refills | Status: DC

## 2018-02-17 MED FILL — AMLODIPINE BESYLATE 5 MG TA: 5 | 30 days supply | Qty: 30 | Fill #0

## 2018-02-17 NOTE — Patient Instructions (Addendum)
Begin amlodipine once daily for blood pressure. Work on a low sodium diet, regular exercise and weight loss to help lower your blood pressure.

## 2018-02-17 NOTE — Progress Notes (Signed)
Subjective:    Patient ID: [name], female    DOB: 1950/09/01, 67 y.o.   MRN: [id number]  HPI  [name] is a 67 yr old female who presents today with chief complaint of vaginal lesions. Reports that "bumps" have been present for several years. Not enlarging. Not painful.  "just want it checked."     Review of Systems See HPI  Past Medical History:  Diagnosis Date  . Allergy    seasonal  . Arachnoiditis   . Depression   . Elevated liver enzymes   . Environmental allergies   . GERD (gastroesophageal reflux disease)   . H/O being hospitalized 03/2012   x1 week for nausea  . History of chicken pox   . Migraine with typical aura    resolved years ago  . Nausea    chronic nausea      Social History   Socioeconomic History  . Marital status: Married    Spouse name: Not on file  . Number of children: Not on file  . Years of education: Not on file  . Highest education level: Not on file  Occupational History  . Not on file  Social Needs  . Financial resource strain: Not on file  . Food insecurity:    Worry: Not on file    Inability: Not on file  . Transportation needs:    Medical: Not on file    Non-medical: Not on file  Tobacco Use  . Smoking status: Former Smoker    Last attempt to quit: 08/09/1986    Years since quitting: 31.5  . Smokeless tobacco: Never Used  Substance and Sexual Activity  . Alcohol use: No  . Drug use: No  . Sexual activity: Not Currently    Partners: Male    Birth control/protection: Other-see comments    Comment: husband had vasectomy.   Lifestyle  . Physical activity:    Days per week: Not on file    Minutes per session: Not on file  . Stress: Not on file  Relationships  . Social connections:    Talks on phone: Not on file    Gets together: Not on file    Attends religious service: Not on file    Active member of club or organization: Not on file    Attends meetings of clubs or organizations: Not on file    Relationship  status: Not on file  . Intimate partner violence:    Fear of current or ex partner: Not on file    Emotionally abused: Not on file    Physically abused: Not on file    Forced sexual activity: Not on file  Other Topics Concern  . Not on file  Social History Narrative   2 step daughters- 41 and 37   She worked as an Web designer.   Enjoys puzzles, counted cross stitch.   Completed 1 year of college   4 cats    Past Surgical History:  Procedure Laterality Date  . ABDOMINOPLASTY  07/11/09  . EUS N/A 05/05/2012   Procedure: UPPER ENDOSCOPIC ULTRASOUND (EUS) LINEAR;  Surgeon: Beryle Beams, MD;  Location: [facility];  Service: Endoscopy;  Laterality: N/A;  . LAPAROSCOPIC CHOLECYSTECTOMY  2010  . NASAL SINUS SURGERY     "Has had 2-3 surgeries over the years"    Family History  Problem Relation Age of Onset  . Alzheimer's disease Mother   . Hypertension Mother   . Migraines Mother   .  Hypertension Father   . Other Father        ?carotid artery aneurysm  . Cancer Brother        mouth and throat    Allergies  Allergen Reactions  . Compazine [Prochlorperazine]     hyperactivity  . Morphine And Related Hives and Itching  . Trintellix [Vortioxetine] Nausea And Vomiting    Current Outpatient Medications on File Prior to Visit  Medication Sig Dispense Refill  . amphetamine-dextroamphetamine (ADDERALL) 20 MG tablet Take 20 mg by mouth daily.    . clonazePAM (KLONOPIN) 1 MG tablet TAKE 1 TABLET BY MOUTH THREE TIMES A DAY AS NEEDED FOR ANXIETY 90 tablet 0  . mirtazapine (REMERON) 15 MG tablet Take 15 mg by mouth at bedtime.  12  . montelukast (SINGULAIR) 10 MG tablet Take 1 tablet (10 mg total) by mouth at bedtime. 30 tablet 3  . omeprazole (PRILOSEC) 40 MG capsule Take 1 capsule (40 mg total) by mouth daily. 90 capsule 3  . ondansetron (ZOFRAN-ODT) 8 MG disintegrating tablet DISSOLVE 1 TABLET BY MOUTH EVERY 8 HOURS AS NEEDED FOR NAUSEA OR VOMITING 90 tablet 0  .  PARoxetine (PAXIL) 40 MG tablet Take 1 tablet (40 mg total) by mouth every morning. 90 tablet 1   No current facility-administered medications on file prior to visit.     BP (!) 166/80 (BP Location: Right Arm, Patient Position: Sitting, Cuff Size: Normal)   Pulse 82   Temp 98.6 F (37 C) (Oral)   Ht 5' 1.5" (1.562 m)   Wt 160 lb 12.8 oz (72.9 kg)   LMP 12/28/2011   SpO2 98%   BMI 29.89 kg/m        Objective:   Physical Exam  Constitutional: She is oriented to person, place, and time. She appears well-developed and well-nourished.  Cardiovascular: Normal rate, regular rhythm and normal heart sounds.  No murmur heard. Pulmonary/Chest: Effort normal and breath sounds normal. No respiratory distress. She has no wheezes.  Musculoskeletal: She exhibits no edema.  Neurological: She is alert and oriented to person, place, and time.  Skin: Skin is warm and dry.  Psychiatric: She has a normal mood and affect. Her behavior is normal. Judgment and thought content normal.  GU:  Multiple small sebaceous cysts on vulva bilaterally        Assessment & Plan:   HTN- bp elevated. Add amlodipine 5mg . Discussed low sodium diet/exercise and weight loss. Follow up in 1 month. BP Readings from Last 3 Encounters:  02/17/18 (!) 166/80  02/10/18 (!) 148/94  02/02/18 132/80   Sebaceous cyst-(vulva) reassurance provided re: benign nature of these lesions.

## 2018-03-02 MED FILL — MONTELUKAST SOD 10 MG TAB: 10 | 30 days supply | Qty: 30 | Fill #1

## 2018-03-07 ENCOUNTER — Encounter: Payer: 59 | Admitting: Family

## 2018-03-16 MED FILL — AMLODIPINE BESYLATE 5 MG TA: 5 | 30 days supply | Qty: 30 | Fill #1

## 2018-03-17 ENCOUNTER — Ambulatory Visit: Payer: 59 | Admitting: Family

## 2018-03-28 ENCOUNTER — Ambulatory Visit (INDEPENDENT_AMBULATORY_CARE_PROVIDER_SITE_OTHER): Payer: 59 | Admitting: Family

## 2018-03-28 ENCOUNTER — Encounter: Payer: Self-pay | Admitting: Family

## 2018-03-28 VITALS — BP 122/74 | HR 72 | Temp 98.4°F | Resp 16 | Ht 62.0 in | Wt 162.0 lb

## 2018-03-28 DIAGNOSIS — I10 Essential (primary) hypertension: Secondary | ICD-10-CM

## 2018-03-28 DIAGNOSIS — R6882 Decreased libido: Secondary | ICD-10-CM

## 2018-03-28 DIAGNOSIS — J069 Acute upper respiratory infection, unspecified: Secondary | ICD-10-CM

## 2018-03-28 MED ORDER — AMLODIPINE BESYLATE 5 MG PO TABS: 5.0000 mg | ORAL_TABLET | Freq: Every day | ORAL | 2 refills | Status: DC

## 2018-03-28 NOTE — Patient Instructions (Signed)
Please continue amlodipine

## 2018-03-28 NOTE — Progress Notes (Signed)
Subjective:    Patient ID: [name], female    DOB: 04/07/1950, 67 y.o.   MRN: [id number]  HPI  Patient is a 67 yr old female who presents today for follow up.  HTN- last visit we added amlodipine 5mg  once daily. She denies side effects or swelling.   BP Readings from Last 3 Encounters:  03/28/18 122/74  02/17/18 (!) 166/80  02/10/18 (!) 148/94   URI symptoms, runny nose, malaise x 2 days.  +sick contacts  She also complains about ongoing low libido.  She is interested in seeing gynecology to discuss further.  She reports that she has not been sexually active with her husband in 1 to 3 years.   Review of Systems See HPI  Past Medical History:  Diagnosis Date  . Allergy    seasonal  . Arachnoiditis   . Depression   . Elevated liver enzymes   . Environmental allergies   . GERD (gastroesophageal reflux disease)   . H/O being hospitalized 03/2012   x1 week for nausea  . History of chicken pox   . Hypertension   . Migraine with typical aura    resolved years ago  . Nausea    chronic nausea      Social History   Socioeconomic History  . Marital status: Married    Spouse name: Not on file  . Number of children: Not on file  . Years of education: Not on file  . Highest education level: Not on file  Occupational History  . Not on file  Social Needs  . Financial resource strain: Not on file  . Food insecurity:    Worry: Not on file    Inability: Not on file  . Transportation needs:    Medical: Not on file    Non-medical: Not on file  Tobacco Use  . Smoking status: Former Smoker    Last attempt to quit: 08/09/1986    Years since quitting: 31.6  . Smokeless tobacco: Never Used  Substance and Sexual Activity  . Alcohol use: No  . Drug use: No  . Sexual activity: Not Currently    Partners: Male    Birth control/protection: Other-see comments    Comment: husband had vasectomy.   Lifestyle  . Physical activity:    Days per week: Not on file    Minutes per  session: Not on file  . Stress: Not on file  Relationships  . Social connections:    Talks on phone: Not on file    Gets together: Not on file    Attends religious service: Not on file    Active member of club or organization: Not on file    Attends meetings of clubs or organizations: Not on file    Relationship status: Not on file  . Intimate partner violence:    Fear of current or ex partner: Not on file    Emotionally abused: Not on file    Physically abused: Not on file    Forced sexual activity: Not on file  Other Topics Concern  . Not on file  Social History Narrative   2 step daughters- 60 and 65   She worked as an Web designer.   Enjoys puzzles, counted cross stitch.   Completed 1 year of college   4 cats    Past Surgical History:  Procedure Laterality Date  . ABDOMINOPLASTY  07/11/09  . EUS N/A 05/05/2012   Procedure: UPPER ENDOSCOPIC ULTRASOUND (EUS) LINEAR;  Surgeon: Saralyn Pilar  Renee Ramus, MD;  Location: [facility];  Service: Endoscopy;  Laterality: N/A;  . LAPAROSCOPIC CHOLECYSTECTOMY  2010  . NASAL SINUS SURGERY     "Has had 2-3 surgeries over the years"    Family History  Problem Relation Age of Onset  . Alzheimer's disease Mother   . Hypertension Mother   . Migraines Mother   . Hypertension Father   . Other Father        ?carotid artery aneurysm  . Cancer Brother        mouth and throat    Allergies  Allergen Reactions  . Compazine [Prochlorperazine]     hyperactivity  . Morphine And Related Hives and Itching  . Trintellix [Vortioxetine] Nausea And Vomiting    Current Outpatient Medications on File Prior to Visit  Medication Sig Dispense Refill  . amLODipine (NORVASC) 5 MG tablet Take 1 tablet (5 mg total) by mouth daily. 30 tablet 2  . amphetamine-dextroamphetamine (ADDERALL) 20 MG tablet Take 20 mg by mouth daily.    . clonazePAM (KLONOPIN) 1 MG tablet TAKE 1 TABLET BY MOUTH THREE TIMES A DAY AS NEEDED FOR ANXIETY 90 tablet 0  .  mirtazapine (REMERON) 15 MG tablet Take 15 mg by mouth at bedtime.  12  . montelukast (SINGULAIR) 10 MG tablet Take 1 tablet (10 mg total) by mouth at bedtime. 30 tablet 3  . omeprazole (PRILOSEC) 40 MG capsule Take 1 capsule (40 mg total) by mouth daily. 90 capsule 3  . ondansetron (ZOFRAN-ODT) 8 MG disintegrating tablet DISSOLVE 1 TABLET BY MOUTH EVERY 8 HOURS AS NEEDED FOR NAUSEA OR VOMITING 90 tablet 0  . PARoxetine (PAXIL) 40 MG tablet Take 1 tablet (40 mg total) by mouth every morning. 90 tablet 1   No current facility-administered medications on file prior to visit.     BP 122/74 (BP Location: Right Arm, Patient Position: Sitting, Cuff Size: Small)   Pulse 72   Temp 98.4 F (36.9 C) (Oral)   Resp 16   Ht 5\' 2"  (1.575 m)   Wt 162 lb (73.5 kg)   LMP 12/28/2011   SpO2 99%   BMI 29.63 kg/m       Objective:   Physical Exam Constitutional:      Appearance: She is well-developed.  HENT:     Right Ear: Tympanic membrane and ear canal normal.     Left Ear: Tympanic membrane and ear canal normal.     Mouth/Throat:     Mouth: Mucous membranes are moist.     Pharynx: No oropharyngeal exudate.  Neck:     Musculoskeletal: Neck supple.     Thyroid: No thyromegaly.  Cardiovascular:     Rate and Rhythm: Normal rate and regular rhythm.     Heart sounds: Normal heart sounds. No murmur.  Pulmonary:     Effort: Pulmonary effort is normal. No respiratory distress.     Breath sounds: Normal breath sounds. No wheezing.  Skin:    General: Skin is warm and dry.  Neurological:     Mental Status: She is alert and oriented to person, place, and time.  Psychiatric:        Behavior: Behavior normal.        Thought Content: Thought content normal.        Judgment: Judgment normal.           Assessment & Plan:  Hypertension-blood pressure is stable and improved on amlodipine.  Continue same.  Upper respiratory infection-discussed supportive measures.  Patient was advised to call if  symptoms worsen or if not improved in 1 week.  Low libido-will refer to gynecology at patient request.

## 2018-04-03 ENCOUNTER — Encounter: Payer: Self-pay | Admitting: Family

## 2018-04-17 ENCOUNTER — Ambulatory Visit (INDEPENDENT_AMBULATORY_CARE_PROVIDER_SITE_OTHER): Payer: 59 | Admitting: Family

## 2018-04-17 ENCOUNTER — Ambulatory Visit (HOSPITAL_BASED_OUTPATIENT_CLINIC_OR_DEPARTMENT_OTHER)
Admission: RE | Admit: 2018-04-17 | Discharge: 2018-04-17 | Disposition: A | Discharge status: Home / Self Care | Payer: 59 | Source: Ambulatory Visit | Attending: Family | Admitting: Family

## 2018-04-17 ENCOUNTER — Encounter: Payer: Self-pay | Admitting: Family

## 2018-04-17 VITALS — BP 140/82 | HR 89 | Temp 98.6°F | Resp 16 | Ht 62.0 in | Wt 160.0 lb

## 2018-04-17 DIAGNOSIS — R05 Cough: Secondary | ICD-10-CM

## 2018-04-17 DIAGNOSIS — J101 Influenza due to other identified influenza virus with other respiratory manifestations: Secondary | ICD-10-CM | POA: Diagnosis not present

## 2018-04-17 DIAGNOSIS — R059 Cough, unspecified: Secondary | ICD-10-CM

## 2018-04-17 DIAGNOSIS — R52 Pain, unspecified: Secondary | ICD-10-CM | POA: Diagnosis not present

## 2018-04-17 LAB — POC INFLUENZA A&B (BINAX/QUICKVUE)
Influenza A, POC: POSITIVE — AB
Influenza B, POC: NEGATIVE

## 2018-04-17 MED ORDER — ALBUTEROL SULFATE (2.5 MG/3ML) 0.083% IN NEBU: 2.5000 mg | INHALATION_SOLUTION | Freq: Once | RESPIRATORY_TRACT | Status: DC

## 2018-04-17 MED ORDER — ALBUTEROL SULFATE (2.5 MG/3ML) 0.083% IN NEBU
2.5000 mg | INHALATION_SOLUTION | Freq: Once | RESPIRATORY_TRACT | Status: AC
  Administered 2018-04-17: 2.5 mg via RESPIRATORY_TRACT

## 2018-04-17 NOTE — Progress Notes (Signed)
Subjective:    Patient ID: [name], female    DOB: 12-20-1950, 68 y.o.   MRN: [id number]  HPI  Patient is a 68yr old female who presents today with c/o persistent cough as well as nasal congestion/HA. We saw her for URI back on 03/28/18.   Symptoms started on 1/15. Then on 1/17 she started coughing which got "deeper and deeper." Did not sleep Saturday due to cough. vomited x 1 today. She reports that she had subjective temp on saturday. She has had myalgia.    Review of Systems    see HPI  Past Medical History:  Diagnosis Date  . Allergy    seasonal  . Arachnoiditis   . Depression   . Elevated liver enzymes   . Environmental allergies   . GERD (gastroesophageal reflux disease)   . H/O being hospitalized 03/2012   x1 week for nausea  . History of chicken pox   . Hypertension   . Migraine with typical aura    resolved years ago  . Nausea    chronic nausea      Social History   Socioeconomic History  . Marital status: Married    Spouse name: Not on file  . Number of children: Not on file  . Years of education: Not on file  . Highest education level: Not on file  Occupational History  . Not on file  Social Needs  . Financial resource strain: Not on file  . Food insecurity:    Worry: Not on file    Inability: Not on file  . Transportation needs:    Medical: Not on file    Non-medical: Not on file  Tobacco Use  . Smoking status: Former Smoker    Last attempt to quit: 08/09/1986    Years since quitting: 31.7  . Smokeless tobacco: Never Used  Substance and Sexual Activity  . Alcohol use: No  . Drug use: No  . Sexual activity: Not Currently    Partners: Male    Birth control/protection: Other-see comments    Comment: husband had vasectomy.   Lifestyle  . Physical activity:    Days per week: Not on file    Minutes per session: Not on file  . Stress: Not on file  Relationships  . Social connections:    Talks on phone: Not on file    Gets together: Not  on file    Attends religious service: Not on file    Active member of club or organization: Not on file    Attends meetings of clubs or organizations: Not on file    Relationship status: Not on file  . Intimate partner violence:    Fear of current or ex partner: Not on file    Emotionally abused: Not on file    Physically abused: Not on file    Forced sexual activity: Not on file  Other Topics Concern  . Not on file  Social History Narrative   2 step daughters- 24 and 51   She worked as an Web designer.   Enjoys puzzles, counted cross stitch.   Completed 1 year of college   4 cats    Past Surgical History:  Procedure Laterality Date  . ABDOMINOPLASTY  07/11/09  . EUS N/A 05/05/2012   Procedure: UPPER ENDOSCOPIC ULTRASOUND (EUS) LINEAR;  Surgeon: Beryle Beams, MD;  Location: [facility];  Service: Endoscopy;  Laterality: N/A;  . LAPAROSCOPIC CHOLECYSTECTOMY  2010  . NASAL SINUS SURGERY     "  Has had 2-3 surgeries over the years"    Family History  Problem Relation Age of Onset  . Alzheimer's disease Mother   . Hypertension Mother   . Migraines Mother   . Hypertension Father   . Other Father        ?carotid artery aneurysm  . Cancer Brother        mouth and throat    Allergies  Allergen Reactions  . Compazine [Prochlorperazine]     hyperactivity  . Morphine And Related Hives and Itching  . Trintellix [Vortioxetine] Nausea And Vomiting    Current Outpatient Medications on File Prior to Visit  Medication Sig Dispense Refill  . amLODipine (NORVASC) 5 MG tablet Take 1 tablet (5 mg total) by mouth daily. 30 tablet 2  . amphetamine-dextroamphetamine (ADDERALL) 20 MG tablet Take 20 mg by mouth daily.    . clonazePAM (KLONOPIN) 1 MG tablet TAKE 1 TABLET BY MOUTH THREE TIMES A DAY AS NEEDED FOR ANXIETY 90 tablet 0  . mirtazapine (REMERON) 15 MG tablet Take 15 mg by mouth at bedtime.  12  . omeprazole (PRILOSEC) 40 MG capsule Take 1 capsule (40 mg total) by  mouth daily. 90 capsule 3  . ondansetron (ZOFRAN-ODT) 8 MG disintegrating tablet DISSOLVE 1 TABLET BY MOUTH EVERY 8 HOURS AS NEEDED FOR NAUSEA OR VOMITING 90 tablet 0  . PARoxetine (PAXIL) 40 MG tablet Take 1 tablet (40 mg total) by mouth every morning. 90 tablet 1   No current facility-administered medications on file prior to visit.     BP 140/82 (BP Location: Right Arm, Patient Position: Sitting, Cuff Size: Small)   Pulse 89   Temp 98.6 F (37 C) (Oral)   Resp 16   Ht 5\' 2"  (1.575 m)   Wt 160 lb (72.6 kg)   LMP 12/28/2011   SpO2 97%   BMI 29.26 kg/m    Objective:   Physical Exam Constitutional:      Appearance: She is well-developed.  HENT:     Head: Normocephalic and atraumatic.     Right Ear: Tympanic membrane and ear canal normal.     Left Ear: Tympanic membrane and ear canal normal.     Mouth/Throat:     Mouth: Mucous membranes are dry.     Pharynx: No posterior oropharyngeal erythema.  Neck:     Musculoskeletal: Neck supple.     Thyroid: No thyromegaly.  Cardiovascular:     Rate and Rhythm: Normal rate and regular rhythm.     Heart sounds: Normal heart sounds. No murmur.  Pulmonary:     Effort: Pulmonary effort is normal. No respiratory distress.     Breath sounds: Normal breath sounds. No wheezing.     Comments: Tight sounding barking cough Skin:    General: Skin is warm and dry.  Neurological:     Mental Status: She is alert and oriented to person, place, and time.  Psychiatric:        Behavior: Behavior normal.        Thought Content: Thought content normal.        Judgment: Judgment normal.           Assessment & Plan:  Influenza A- pt was given albuterol neb.  Did not notice much improvement with neb. Will refer for CXR for further evaluation.discussed the following:  You may use delsym as needed for cough. Call if new/worsening symptoms or if symptoms are not improved in 3-4 days.

## 2018-04-17 NOTE — Patient Instructions (Addendum)
Please complete x ray prior to leaving.  You may use delsym as needed for cough. Call if new/worsening symptoms or if symptoms are not improved in 3-4 days.

## 2018-04-19 ENCOUNTER — Ambulatory Visit: Payer: Self-pay | Admitting: *Deleted

## 2018-04-19 ENCOUNTER — Ambulatory Visit (INDEPENDENT_AMBULATORY_CARE_PROVIDER_SITE_OTHER): Payer: 59 | Admitting: Family

## 2018-04-19 VITALS — BP 142/64 | HR 73 | Temp 99.0°F | Resp 16 | Ht 62.0 in | Wt 159.0 lb

## 2018-04-19 DIAGNOSIS — J111 Influenza due to unidentified influenza virus with other respiratory manifestations: Secondary | ICD-10-CM

## 2018-04-19 MED ORDER — BENZONATATE 100 MG PO CAPS: 100.0000 mg | ORAL_CAPSULE | Freq: Three times a day (TID) | ORAL | 0 refills | Status: DC | PRN

## 2018-04-19 MED ORDER — AZITHROMYCIN 250 MG PO TABS: ORAL_TABLET | ORAL | 0 refills | Status: DC

## 2018-04-19 MED ORDER — PREDNISONE 10 MG PO TABS: ORAL_TABLET | ORAL | 0 refills | Status: DC

## 2018-04-19 MED ORDER — ALBUTEROL SULFATE HFA 108 (90 BASE) MCG/ACT IN AERS: 2.0000 | INHALATION_SPRAY | Freq: Four times a day (QID) | RESPIRATORY_TRACT | 0 refills | Status: DC | PRN

## 2018-04-19 MED FILL — predniSONE 10 MG TABS: 10 | 8 days supply | Qty: 20 | Fill #0

## 2018-04-19 MED FILL — BENZONATATE 100 MG CAP: 100 | 6 days supply | Qty: 20 | Fill #0

## 2018-04-19 MED FILL — AZITHROMYCIN 250 MG TABLET: 250 | 5 days supply | Qty: 6 | Fill #0

## 2018-04-19 MED FILL — VENTOLIN HFA 90 MCG INHALER: 108 (90 BAS | 25 days supply | Qty: 18 | Fill #0

## 2018-04-19 NOTE — Telephone Encounter (Signed)
Attempted to contact pt regarding symptoms; however, see nurse triage note dated 04/19/2018.

## 2018-04-19 NOTE — Patient Instructions (Signed)
Please start azithromycin (antibiotic), tessalon pearls as needed for cough, albuterol as needed for wheezing/tightness, and prednisone taper.  Call if symptoms worsen or if not improved in 2-3 days. Go to ER if severe symptoms.

## 2018-04-19 NOTE — Telephone Encounter (Signed)
Attempted to contact pt regarding symptoms; left message on voicemail [phone].

## 2018-04-19 NOTE — Progress Notes (Signed)
Subjective:    Patient ID: [name], female    DOB: Apr 28, 1950, 68 y.o.   MRN: [id number]  HPI   Patient is a 68 yr old female who we saw on 1/20 with influenza A who presents today with worsening chest congestion/cough. Reports that she coughs so hard and so long sometimes that she feels like she can't catch her breath. She has tried delsym, mucinex, elderberry, ibuprofen without improvement in her symptoms.     Review of Systems See HPI  Past Medical History:  Diagnosis Date  . Allergy    seasonal  . Arachnoiditis   . Depression   . Elevated liver enzymes   . Environmental allergies   . GERD (gastroesophageal reflux disease)   . H/O being hospitalized 03/2012   x1 week for nausea  . History of chicken pox   . Hypertension   . Migraine with typical aura    resolved years ago  . Nausea    chronic nausea      Social History   Socioeconomic History  . Marital status: Married    Spouse name: Not on file  . Number of children: Not on file  . Years of education: Not on file  . Highest education level: Not on file  Occupational History  . Not on file  Social Needs  . Financial resource strain: Not on file  . Food insecurity:    Worry: Not on file    Inability: Not on file  . Transportation needs:    Medical: Not on file    Non-medical: Not on file  Tobacco Use  . Smoking status: Former Smoker    Last attempt to quit: 08/09/1986    Years since quitting: 31.7  . Smokeless tobacco: Never Used  Substance and Sexual Activity  . Alcohol use: No  . Drug use: No  . Sexual activity: Not Currently    Partners: Male    Birth control/protection: Other-see comments    Comment: husband had vasectomy.   Lifestyle  . Physical activity:    Days per week: Not on file    Minutes per session: Not on file  . Stress: Not on file  Relationships  . Social connections:    Talks on phone: Not on file    Gets together: Not on file    Attends religious service: Not on file   Active member of club or organization: Not on file    Attends meetings of clubs or organizations: Not on file    Relationship status: Not on file  . Intimate partner violence:    Fear of current or ex partner: Not on file    Emotionally abused: Not on file    Physically abused: Not on file    Forced sexual activity: Not on file  Other Topics Concern  . Not on file  Social History Narrative   2 step daughters- 34 and 64   She worked as an Web designer.   Enjoys puzzles, counted cross stitch.   Completed 1 year of college   4 cats    Past Surgical History:  Procedure Laterality Date  . ABDOMINOPLASTY  07/11/09  . EUS N/A 05/05/2012   Procedure: UPPER ENDOSCOPIC ULTRASOUND (EUS) LINEAR;  Surgeon: Beryle Beams, MD;  Location: [facility];  Service: Endoscopy;  Laterality: N/A;  . LAPAROSCOPIC CHOLECYSTECTOMY  2010  . NASAL SINUS SURGERY     "Has had 2-3 surgeries over the years"    Family History  Problem Relation  Age of Onset  . Alzheimer's disease Mother   . Hypertension Mother   . Migraines Mother   . Hypertension Father   . Other Father        ?carotid artery aneurysm  . Cancer Brother        mouth and throat    Allergies  Allergen Reactions  . Compazine [Prochlorperazine]     hyperactivity  . Morphine And Related Hives and Itching  . Trintellix [Vortioxetine] Nausea And Vomiting    Current Outpatient Medications on File Prior to Visit  Medication Sig Dispense Refill  . amLODipine (NORVASC) 5 MG tablet Take 1 tablet (5 mg total) by mouth daily. 30 tablet 2  . amphetamine-dextroamphetamine (ADDERALL) 20 MG tablet Take 20 mg by mouth daily.    . clonazePAM (KLONOPIN) 1 MG tablet TAKE 1 TABLET BY MOUTH THREE TIMES A DAY AS NEEDED FOR ANXIETY 90 tablet 0  . mirtazapine (REMERON) 15 MG tablet Take 15 mg by mouth at bedtime.  12  . omeprazole (PRILOSEC) 40 MG capsule Take 1 capsule (40 mg total) by mouth daily. 90 capsule 3  . ondansetron (ZOFRAN-ODT) 8  MG disintegrating tablet DISSOLVE 1 TABLET BY MOUTH EVERY 8 HOURS AS NEEDED FOR NAUSEA OR VOMITING 90 tablet 0  . PARoxetine (PAXIL) 40 MG tablet Take 1 tablet (40 mg total) by mouth every morning. 90 tablet 1   No current facility-administered medications on file prior to visit.     BP (!) 142/64 (BP Location: Left Arm, Patient Position: Sitting, Cuff Size: Small)   Pulse 73   Temp 99 F (37.2 C) (Oral)   Resp 16   Ht 5\' 2"  (1.575 m)   Wt 159 lb (72.1 kg)   LMP 12/28/2011   SpO2 97%   BMI 29.08 kg/m       Objective:   Physical Exam Constitutional:      Appearance: She is well-developed.  Neck:     Musculoskeletal: Neck supple.     Thyroid: No thyromegaly.  Cardiovascular:     Rate and Rhythm: Normal rate and regular rhythm.     Heart sounds: Normal heart sounds. No murmur.  Pulmonary:     Effort: Pulmonary effort is normal. No respiratory distress.     Breath sounds: Normal breath sounds. No wheezing.     Comments: Loud spastic barking cough with prolonged coughing fits Skin:    General: Skin is warm and dry.  Neurological:     Mental Status: She is alert and oriented to person, place, and time.  Psychiatric:        Behavior: Behavior normal.        Thought Content: Thought content normal.        Judgment: Judgment normal.           Assessment & Plan:  Bronchitis in setting of influenza- advised pt as follows:  Please start azithromycin (antibiotic), tessalon pearls as needed for cough, albuterol as needed for wheezing/tightness, and prednisone taper.  Call if symptoms worsen or if not improved in 2-3 days. Go to ER if severe symptoms.

## 2018-04-19 NOTE — Telephone Encounter (Signed)
Pt called with having a congested cough. The phlegm is so thick she can not get it up. She feels like she is having some labored breathing at times. Her whole body hurts. She denies fever. Her head feels heavy.  She needs assistance in getting the congestion out.  Was seen in the office on Monday.  Per protocol, she needs to be seen within 24 hrs.  Appointment scheduled for today. Routing to flow at [facility] at [facility]. Will call 911 if difficulty breathing. Pt voiced understanding.  Reason for Disposition . SEVERE coughing spells (e.g., whooping sound after coughing, vomiting after coughing)  Answer Assessment - Initial Assessment Questions 1. ONSET: "When did the cough begin?"      Since Monday 2. SEVERITY: "How bad is the cough today?"      Pretty bad 3. RESPIRATORY DISTRESS: "Describe your breathing."      Shortness of breath when coughing and some labored breathing 4. FEVER: "Do you have a fever?" If so, ask: "What is your temperature, how was it measured, and when did it start?"     no 5. SPUTUM: "Describe the color of your sputum" (clear, white, yellow, green)     No sputum 6. HEMOPTYSIS: "Are you coughing up any blood?" If so ask: "How much?" (flecks, streaks, tablespoons, etc.)     no 7. CARDIAC HISTORY: "Do you have any history of heart disease?" (e.g., heart attack, congestive heart failure)      no 8. LUNG HISTORY: "Do you have any history of lung disease?"  (e.g., pulmonary embolus, asthma, emphysema)     allergies 9. PE RISK FACTORS: "Do you have a history of blood clots?" (or: recent major surgery, recent prolonged travel, bedridden)     no 10. OTHER SYMPTOMS: "Do you have any other symptoms?" (e.g., runny nose, wheezing, chest pain)       She thinks she is wheezing, hold body hurts 11. PREGNANCY: "Is there any chance you are pregnant?" "When was your last menstrual period?"       no 12. TRAVEL: "Have you traveled out of the country in the last month?" (e.g., travel  history, exposures)       no  Protocols used: [place]

## 2018-04-20 MED FILL — AMLODIPINE BESYLATE 5 MG TA: 5 | 30 days supply | Qty: 30 | Fill #2

## 2018-05-15 MED FILL — AMLODIPINE BESYLATE 5 MG TA: 5 | 30 days supply | Qty: 30 | Fill #0

## 2018-06-07 ENCOUNTER — Encounter: Payer: Self-pay | Admitting: Family

## 2018-06-07 ENCOUNTER — Ambulatory Visit (INDEPENDENT_AMBULATORY_CARE_PROVIDER_SITE_OTHER): Payer: 59 | Admitting: Family

## 2018-06-07 ENCOUNTER — Other Ambulatory Visit: Payer: Self-pay

## 2018-06-07 VITALS — BP 124/74 | HR 81 | Temp 98.4°F | Resp 18 | Wt 159.0 lb

## 2018-06-07 DIAGNOSIS — J019 Acute sinusitis, unspecified: Secondary | ICD-10-CM | POA: Diagnosis not present

## 2018-06-07 MED ORDER — AMOXICILLIN-POT CLAVULANATE 875-125 MG PO TABS: 1.0000 | ORAL_TABLET | Freq: Two times a day (BID) | ORAL | 0 refills | Status: DC

## 2018-06-07 MED FILL — AMOX-CLAV 875-125 MG TABLET: 875-125 | 10 days supply | Qty: 20 | Fill #0

## 2018-06-07 NOTE — Progress Notes (Signed)
Subjective:    Patient ID: [name], female    DOB: 09-Aug-1950, 68 y.o.   MRN: [id number]  HPI  Patient is a 68 yr old female who presents today with c/o nasal congestion/sinus pain and HA which has been present x 3 weeks. Reports associated cough.  + fatigue.       Review of Systems    see HPI  Past Medical History:  Diagnosis Date  . Allergy    seasonal  . Arachnoiditis   . Depression   . Elevated liver enzymes   . Environmental allergies   . GERD (gastroesophageal reflux disease)   . H/O being hospitalized 03/2012   x1 week for nausea  . History of chicken pox   . Hypertension   . Migraine with typical aura    resolved years ago  . Nausea    chronic nausea      Social History   Socioeconomic History  . Marital status: Married    Spouse name: Not on file  . Number of children: Not on file  . Years of education: Not on file  . Highest education level: Not on file  Occupational History  . Not on file  Social Needs  . Financial resource strain: Not on file  . Food insecurity:    Worry: Not on file    Inability: Not on file  . Transportation needs:    Medical: Not on file    Non-medical: Not on file  Tobacco Use  . Smoking status: Former Smoker    Last attempt to quit: 08/09/1986    Years since quitting: 31.8  . Smokeless tobacco: Never Used  Substance and Sexual Activity  . Alcohol use: No  . Drug use: No  . Sexual activity: Not Currently    Partners: Male    Birth control/protection: Other-see comments    Comment: husband had vasectomy.   Lifestyle  . Physical activity:    Days per week: Not on file    Minutes per session: Not on file  . Stress: Not on file  Relationships  . Social connections:    Talks on phone: Not on file    Gets together: Not on file    Attends religious service: Not on file    Active member of club or organization: Not on file    Attends meetings of clubs or organizations: Not on file    Relationship status: Not on  file  . Intimate partner violence:    Fear of current or ex partner: Not on file    Emotionally abused: Not on file    Physically abused: Not on file    Forced sexual activity: Not on file  Other Topics Concern  . Not on file  Social History Narrative   2 step daughters- 80 and 76   She worked as an Web designer.   Enjoys puzzles, counted cross stitch.   Completed 1 year of college   4 cats    Past Surgical History:  Procedure Laterality Date  . ABDOMINOPLASTY  07/11/09  . EUS N/A 05/05/2012   Procedure: UPPER ENDOSCOPIC ULTRASOUND (EUS) LINEAR;  Surgeon: Beryle Beams, MD;  Location: [facility];  Service: Endoscopy;  Laterality: N/A;  . LAPAROSCOPIC CHOLECYSTECTOMY  2010  . NASAL SINUS SURGERY     "Has had 2-3 surgeries over the years"    Family History  Problem Relation Age of Onset  . Alzheimer's disease Mother   . Hypertension Mother   . Migraines  Mother   . Hypertension Father   . Other Father        ?carotid artery aneurysm  . Cancer Brother        mouth and throat    Allergies  Allergen Reactions  . Compazine [Prochlorperazine]     hyperactivity  . Morphine And Related Hives and Itching  . Trintellix [Vortioxetine] Nausea And Vomiting    Current Outpatient Medications on File Prior to Visit  Medication Sig Dispense Refill  . amLODipine (NORVASC) 5 MG tablet Take 1 tablet (5 mg total) by mouth daily. 30 tablet 2  . amphetamine-dextroamphetamine (ADDERALL) 20 MG tablet Take 20 mg by mouth daily.    . clonazePAM (KLONOPIN) 1 MG tablet TAKE 1 TABLET BY MOUTH THREE TIMES A DAY AS NEEDED FOR ANXIETY 90 tablet 0  . mirtazapine (REMERON) 15 MG tablet Take 15 mg by mouth at bedtime.  12  . omeprazole (PRILOSEC) 40 MG capsule Take 1 capsule (40 mg total) by mouth daily. 90 capsule 3  . ondansetron (ZOFRAN-ODT) 8 MG disintegrating tablet DISSOLVE 1 TABLET BY MOUTH EVERY 8 HOURS AS NEEDED FOR NAUSEA OR VOMITING 90 tablet 0  . PARoxetine (PAXIL) 40 MG  tablet Take 1 tablet (40 mg total) by mouth every morning. 90 tablet 1  . predniSONE (DELTASONE) 10 MG tablet 4 tabs by mouth once daily for 2 days, then 3 tabs daily x 2 days, then 2 tabs daily x 2 days, then 1 tab daily x 2 days 20 tablet 0   No current facility-administered medications on file prior to visit.     BP 124/74 (BP Location: Left Arm, Patient Position: Sitting, Cuff Size: Small)   Pulse 81   Temp 98.4 F (36.9 C) (Oral)   Resp 18   Wt 159 lb (72.1 kg)   LMP 12/28/2011   SpO2 98%   BMI 29.08 kg/m    Objective:   Physical Exam Constitutional:      Appearance: She is well-developed.  HENT:     Right Ear: Tympanic membrane and ear canal normal.     Left Ear: Tympanic membrane and ear canal normal.     Nose:     Right Sinus: Maxillary sinus tenderness and frontal sinus tenderness present.     Left Sinus: Maxillary sinus tenderness and frontal sinus tenderness present.     Mouth/Throat:     Pharynx: No oropharyngeal exudate or posterior oropharyngeal erythema.  Neck:     Musculoskeletal: Neck supple.     Thyroid: No thyromegaly.  Cardiovascular:     Rate and Rhythm: Normal rate and regular rhythm.     Heart sounds: Normal heart sounds. No murmur.  Pulmonary:     Effort: Pulmonary effort is normal. No respiratory distress.     Breath sounds: Normal breath sounds. No wheezing.  Skin:    General: Skin is warm and dry.  Neurological:     Mental Status: She is alert and oriented to person, place, and time.  Psychiatric:        Behavior: Behavior normal.        Thought Content: Thought content normal.        Judgment: Judgment normal.           Assessment & Plan:  Sinusitis- Pt is advised as follows: Please begin augmentin twice daily. Continue flonase/allegra for allergies. Call if new/worsening symptoms or if not improved in 3-4 days.

## 2018-06-07 NOTE — Patient Instructions (Signed)
Please begin augmentin twice daily. Call if new/worsening symptoms or if not improved in 3-4 days.

## 2018-06-08 ENCOUNTER — Other Ambulatory Visit: Payer: Self-pay | Admitting: Family

## 2018-06-19 ENCOUNTER — Encounter: Payer: Self-pay | Admitting: Family Medicine

## 2018-06-21 MED FILL — AMLODIPINE BESYLATE 5 MG TA: 5 | 30 days supply | Qty: 30 | Fill #1

## 2018-06-27 ENCOUNTER — Other Ambulatory Visit: Payer: Self-pay

## 2018-06-27 ENCOUNTER — Ambulatory Visit (INDEPENDENT_AMBULATORY_CARE_PROVIDER_SITE_OTHER): Payer: 59 | Admitting: Family

## 2018-06-27 DIAGNOSIS — I10 Essential (primary) hypertension: Secondary | ICD-10-CM | POA: Diagnosis not present

## 2018-06-27 DIAGNOSIS — F419 Anxiety disorder, unspecified: Secondary | ICD-10-CM | POA: Diagnosis not present

## 2018-06-27 DIAGNOSIS — F32A Depression, unspecified: Secondary | ICD-10-CM

## 2018-06-27 DIAGNOSIS — F329 Major depressive disorder, single episode, unspecified: Secondary | ICD-10-CM

## 2018-06-27 DIAGNOSIS — J302 Other seasonal allergic rhinitis: Secondary | ICD-10-CM

## 2018-06-27 MED ORDER — AMLODIPINE BESYLATE 5 MG PO TABS: 5.0000 mg | ORAL_TABLET | Freq: Every day | ORAL | 0 refills | Status: DC

## 2018-06-27 MED ORDER — MONTELUKAST SODIUM 10 MG PO TABS: 10.0000 mg | ORAL_TABLET | Freq: Every day | ORAL | 3 refills | Status: DC

## 2018-06-27 NOTE — Progress Notes (Signed)
Virtual Visit via Video Note  I connected with [name]  on 06/27/18 at 11:00 AM EDT by a video enabled telemedicine application and verified that I am speaking with the correct person using two identifiers.   I discussed the limitations of evaluation and management by telemedicine and the availability of in person appointments. The patient expressed understanding and agreed to proceed.  Only the patient and myself were on today's video visit. The patient was at home and I was in my home at the time of today's visit.   History of Present Illness:  Patient is a 68 yr old female who presents today for follow up.  Depression- She continues to follow with Dr. Toy Care (psychiatry).  She reports that her last visit was 2 weeks ago.   HTN- continues amlodipine once daily. Reports home bp last week was 137/78.    BP Readings from Last 3 Encounters:  06/07/18 124/74  04/19/18 (!) 142/64  04/17/18 140/82   Allergic rhinitis- reports that her allergies are really bothering her. Has a mild sore throat and nasal congestion. She is using claritin D in place of allegra. Reports that sometimes it helps and other times it does not help. Reports intolerant to flonase.    Observations/Objective: Depression screen [facility] 2/9 06/27/2018 11/11/2017 09/09/2017 12/15/2016 09/08/2016  Decreased Interest 2 1 0 3 3  Down, Depressed, Hopeless 0 1 1 3 3   PHQ - 2 Score 2 2 1 6 6   Altered sleeping 1 3 - 2 3  Tired, decreased energy 2 2 - 3 3  Change in appetite 2 2 - 3 3  Feeling bad or failure about yourself  3 2 - 3 3  Trouble concentrating 0 1 - 2 0  Moving slowly or fidgety/restless 0 0 - 2 3  Suicidal thoughts 0 0 - 0 0  PHQ-9 Score 10 12 - 21 21  Difficult doing work/chores Somewhat difficult - - Very difficult Very difficult     Assessment and Plan:   Gen: Awake, alert, no acute distress Resp: Breathing is even and non-labored Psych: calm/pleasant demeanor, slightly flattened affect Neuro: Alert and  Oriented x 3, + facial symmetry, speech is clear.  Follow Up Instructions:  Depression- fair control. Definitely better than she was a few years back. Med management per psychiatry. We discussed having something to look forward to every day and try to get outside and get some fresh air/walk since she is spending a lot of time in the house during covid pandemic.   HTN- bp is stable on current meds. Continue same.  Seasonal allergies/allergic rhinitis- uncontrolled. Will add singulair trial.      I discussed the assessment and treatment plan with the patient. The patient was provided an opportunity to ask questions and all were answered. The patient agreed with the plan and demonstrated an understanding of the instructions.   The patient was advised to call back or seek an in-person evaluation if the symptoms worsen or if the condition fails to improve as anticipated.    Nance Pear, NP

## 2018-06-29 ENCOUNTER — Other Ambulatory Visit: Payer: Self-pay

## 2018-06-29 ENCOUNTER — Ambulatory Visit (HOSPITAL_BASED_OUTPATIENT_CLINIC_OR_DEPARTMENT_OTHER)
Admission: RE | Admit: 2018-06-29 | Discharge: 2018-06-29 | Disposition: A | Discharge status: Home / Self Care | Payer: 59 | Source: Ambulatory Visit | Attending: Family | Admitting: Family

## 2018-06-29 ENCOUNTER — Ambulatory Visit: Payer: Self-pay

## 2018-06-29 ENCOUNTER — Ambulatory Visit (INDEPENDENT_AMBULATORY_CARE_PROVIDER_SITE_OTHER): Payer: 59 | Admitting: Family

## 2018-06-29 DIAGNOSIS — R140 Abdominal distension (gaseous): Secondary | ICD-10-CM

## 2018-06-29 DIAGNOSIS — R109 Unspecified abdominal pain: Secondary | ICD-10-CM

## 2018-06-29 DIAGNOSIS — K5900 Constipation, unspecified: Secondary | ICD-10-CM | POA: Diagnosis not present

## 2018-06-29 NOTE — Addendum Note (Signed)
Addended by: [name] on: 06/29/2018 04:31 PM   Modules accepted: Orders

## 2018-06-29 NOTE — Telephone Encounter (Signed)
Pt c/o 2 weeks h/o abdominal pain, acid reflux. Pt has umbilical hernia and pt stated that her abdomen is distended. Pt rates pain a 3-4. Pt also c/o constipation (pt stated she had a very small BM this morning). Pt also c/o sore "spot" located under left breast to the left of the sternum and down to epigastric area. Pt denies chest, jaw, or left arm pain. Pt stated that that area is tender to touch. Pt has been taking Ibuprofen for pain. Anne Fu at office and informed her of triage assessment. Rod Holler stated to put pt on schedule for tomorrow at 2 and to tell pt she will call her.  Care advice given and pt verbalized understanding.   Reason for Disposition . [1] MILD-MODERATE pain AND [2] constant AND [3] present > 2 hours  Additional Information . Commented on: [1] Vomiting AND [2] abdomen looks much more swollen than usual    Abdomen is distended  Answer Assessment - Initial Assessment Questions 1. LOCATION: "Where does it hurt?"      Above navel more towards left of the abdomen shoots to sternum- sore spot under left breast to epigastric area 2. RADIATION: "Does the pain shoot anywhere else?" (e.g., chest, back)     Above navel more towards left of the abdomen shoots to sternum- sore spot under left breast to epigastric area 3. ONSET: "When did the pain begin?" (e.g., minutes, hours or days ago)      2 weeks ago 4. SUDDEN: "Gradual or sudden onset?"     Gradually pants were more tight at waist 5. PATTERN "Does the pain come and go, or is it constant?"    - If constant: "Is it getting better, staying the same, or worsening?"      (Note: Constant means the pain never goes away completely; most serious pain is constant and it progresses)     - If intermittent: "How long does it last?" "Do you have pain now?"     (Note: Intermittent means the pain goes away completely between bouts)     Comes and goes worse when she sits down 6. SEVERITY: "How bad is the pain?"  (e.g., Scale 1-10; mild,  moderate, or severe)   - MILD (1-3): doesn't interfere with normal activities, abdomen soft and not tender to touch    - MODERATE (4-7): interferes with normal activities or awakens from sleep, tender to touch    - SEVERE (8-10): excruciating pain, doubled over, unable to do any normal activities      moderate 7. RECURRENT SYMPTOM: "Have you ever had this type of abdominal pain before?" If so, ask: "When was the last time?" and "What happened that time?"      Gangrenous gallbladder 7 years ago 8. CAUSE: "What do you think is causing the abdominal pain?"     Umbilical hernia 9. RELIEVING/AGGRAVATING FACTORS: "What makes it better or worse?" (e.g., movement, antacids, bowel movement)     Worse : movement      Better: ibuprofen 600 mg hs,  10. OTHER SYMPTOMS: "Has there been any vomiting, diarrhea, constipation, or urine problems?"       Constipation Last BM this am- acid reflux, headache, abdomen distended, hurts from the sore spot under left breast and left of sternum and also aches, SOB at times,  11. PREGNANCY: "Is there any chance you are pregnant?" "When was your last menstrual period?"       n/a  Protocols used: ABDOMINAL PAIN - [facility]

## 2018-06-29 NOTE — Progress Notes (Signed)
Virtual Visit via Video Note  I connected with [name] on 06/29/18 at  7:30 AM EDT by a video enabled telemedicine application and verified that I am speaking with the correct person using two identifiers.   I discussed the limitations of evaluation and management by telemedicine and the availability of in person appointments. The patient expressed understanding and agreed to proceed.  Only the patient and myself were on today's video visit. The patient was at home and I was in my office at the time of today's visit.   History of Present Illness:  Patient is a 68 yr old female who presents today with chief complaint of abdominal distension. She reports that has discomfort in her abdomen below her left breast and that it moves towards her sterum.  Reports that she has been taking 4-6 tums in addition to omeprazole.  Reports that 2 nights ago she vomited. Had small bm today.  Last night she had reflux symptoms "all night." reports that her umbilicus is more "pushed out" than usual.  Also notes that she has a firm area of bulge between her umbilicus and her pubic bone.   Observations/Objective:  Gen: Awake, alert, no acute distress Resp: Breathing is even and non-labored Psych: calm/pleasant demeanor Neuro: Alert and Oriented x 3, + facial symmetry, speech is clear. Abd: abdomen is distended.  Small umbilical hernia is visible.   Assessment and Plan:  Abdominal Distension- I have asked the patient to proceed to the imaging department at [facility] to get a stat KUB.  Need to rule out SBO.     Follow Up Instructions:    I discussed the assessment and treatment plan with the patient. The patient was provided an opportunity to ask questions and all were answered. The patient agreed with the plan and demonstrated an understanding of the instructions.   The patient was advised to call back or seek an in-person evaluation if the symptoms worsen or if the condition fails to improve as  anticipated.    Nance Pear, NP

## 2018-06-29 NOTE — Telephone Encounter (Signed)
Patient set up for virtual visit tomorrow

## 2018-06-29 NOTE — Telephone Encounter (Signed)
Spoke to patient. She is agreeable to virtual visit today.

## 2018-06-30 ENCOUNTER — Ambulatory Visit: Payer: Self-pay | Admitting: Family

## 2018-06-30 NOTE — Telephone Encounter (Signed)
Addendum: spoke with patient last night.  Reviewed KUB results consistent with constipation. Advised pt to add 1 capful of miralax nightly for the next few nights then as needed.

## 2018-07-17 ENCOUNTER — Encounter: Payer: Self-pay | Admitting: Obstetrics & Gynecology

## 2018-08-07 ENCOUNTER — Encounter: Payer: Self-pay | Admitting: Obstetrics & Gynecology

## 2018-08-18 DIAGNOSIS — M5418 Radiculopathy, sacral and sacrococcygeal region: Secondary | ICD-10-CM | POA: Diagnosis not present

## 2018-08-18 DIAGNOSIS — M5442 Lumbago with sciatica, left side: Secondary | ICD-10-CM | POA: Diagnosis not present

## 2018-08-18 DIAGNOSIS — M5441 Lumbago with sciatica, right side: Secondary | ICD-10-CM | POA: Diagnosis not present

## 2018-08-18 DIAGNOSIS — G43009 Migraine without aura, not intractable, without status migrainosus: Secondary | ICD-10-CM | POA: Diagnosis not present

## 2018-09-04 ENCOUNTER — Encounter: Payer: Self-pay | Admitting: Obstetrics & Gynecology

## 2018-09-04 ENCOUNTER — Other Ambulatory Visit: Payer: Self-pay

## 2018-09-04 ENCOUNTER — Ambulatory Visit (INDEPENDENT_AMBULATORY_CARE_PROVIDER_SITE_OTHER): Payer: 59 | Admitting: Obstetrics & Gynecology

## 2018-09-04 VITALS — BP 134/63 | HR 75 | Ht 62.0 in | Wt 162.1 lb

## 2018-09-04 DIAGNOSIS — Z01419 Encounter for gynecological examination (general) (routine) without abnormal findings: Secondary | ICD-10-CM

## 2018-09-04 DIAGNOSIS — N952 Postmenopausal atrophic vaginitis: Secondary | ICD-10-CM

## 2018-09-04 DIAGNOSIS — F520 Hypoactive sexual desire disorder: Secondary | ICD-10-CM

## 2018-09-04 DIAGNOSIS — N941 Unspecified dyspareunia: Secondary | ICD-10-CM

## 2018-09-04 NOTE — Progress Notes (Signed)
Low libido x 5 years. Last mammogram 01/2018. [name] l [place] Droll, CMA

## 2018-09-04 NOTE — Patient Instructions (Signed)
Lubricants  - Water or silicone-based  - No perfumes; avoid glycerin or parabens  - If water based look for information on osmolality  - Lubricate all surfaces as a part of foreplay  - Keep lubricant handy in case more is needed  - Sneaking into bathroom before sex is not a good way to use lubricants    Name of Product Description Perfume- Free Paraben-Free Glycerin-Free PH-balanced Cost per month  Hyalo-GYN Gel in Tampon applicator containing Hydeal-D, a natural source of moisture. Http://www.hyalogyn.com Yes No Yes No $25  Replens Gel in tampon applicator, Clings to vaginal lining to keep it moist Yes Yes No Yes $15-32  K-Y Liquibeads Suppository that melts in the vagina Yes Yes No No $18-36  Neogyn Cream Cream to soothe vulvar dryness and pain, contains cutaneous lysate, a healing ingredient; not internal moisturizer but may help with irritation on the vulvar  LinkMoves.fr Yes Yes No No $39   Vaginal Moisturizers: Least expensive and first line: coconut oil. Organic from grocery store 3 times a day

## 2018-09-04 NOTE — Progress Notes (Signed)
Subjective:     [name] is a 68 y.o. female here for a routine exam.  Current complaints: pt reports decreased sex drive for 5 years. She reports that 5 years prev she had painful intercourse and she has not attempted again since that times. She is married. Her chart refects vaginal atrophy since 2014 when she was begin seen by Dr. Elyse Hsu.     Pt has read about Hypoactive Sexual Desire Disorder (HSDD) and wonders if she has this condition; she read about this and gave the name to the receptionist. She did not address this during her visit.  Gynecologic History Patient's last menstrual period was 12/28/2011. Contraception: post menopausal status Last Pap: 09/13/2017 Results were: normal Last mammogram: 04/13/2005 Results were: normal  Obstetric History OB History  Gravida Para Term Preterm AB Living  1 0     1    SAB TAB Ectopic Multiple Live Births    1          # Outcome Date GA Lbr Len/2nd Weight Sex Delivery Anes PTL Lv  1 TAB              The following portions of the patient's history were reviewed and updated as appropriate: allergies, current medications, past family history, past medical history, past social history, past surgical history and problem list.  Review of Systems Pertinent items are noted in HPI.    Objective:  BP 134/63   Pulse 75   Ht 5\' 2"  (1.575 m)   Wt 162 lb 1.9 oz (73.5 kg)   LMP 12/28/2011   BMI 29.65 kg/m   General Appearance:    Alert, cooperative, no distress, appears stated age  Head:    Normocephalic, without obvious abnormality, atraumatic  Eyes:    conjunctiva/corneas clear, EOM's intact, both eyes  Ears:    Normal external ear canals, both ears  Nose:   Nares normal, septum midline, mucosa normal, no drainage    or sinus tenderness  Throat:   Lips, mucosa, and tongue normal; teeth and gums normal  Neck:   Supple, symmetrical, trachea midline, no adenopathy;    thyroid:  no enlargement/tenderness/nodules  Back:     Symmetric,  no curvature, ROM normal, no CVA tenderness  Lungs:     respirations unlabored  Chest Wall:    No tenderness or deformity   Heart:    Regular rate and rhythm, S1 and S2 normal, no murmur, rub   or gallop  Breast Exam:    No tenderness, masses, or nipple abnormality  Abdomen:     Soft, non-tender, bowel sounds active all four quadrants,    no masses, no organomegaly  Genitalia:    Normal female without lesion, discharge or tenderness; there is narrowing of the introitus and atrophy noted of the vulva. Loss of vaginal rugae. There is also decreased lubrcation       Extremities:   Extremities normal, atraumatic, no cyanosis or edema  Pulses:   2+ and symmetric all extremities  Skin:   Skin color, texture, turgor normal, no rashes or lesions    Assessment:    Healthy female exam.   Genitourinary syndrome of menopause.  Dyspareunia  Decreased sexual activity etiology not determined. Possibly related to above plus other factors     Plan:    Follow up in: 6 weeks.    Rec coconut oil tid for moisture [place] for stimulation. D/w pt additional ways to achieve stimulation outside of penetration. Recommendation that she try  this to determine if there is a neurologic cause of her sx.  Encouraged pt to have dates with her spouse 3 times before our next visit.    Total face-to-face time with patient was 25 min.  Greater than 50% was spent in counseling and coordination of care with the patient.

## 2018-09-13 NOTE — Progress Notes (Deleted)
Virtual Visit via Video Note  I connected with patient on  09/14/18 at 11:00 AM EDT by audio enabled telemedicine application and verified that I am speaking with the correct person using two identifiers.   THIS ENCOUNTER IS A VIRTUAL VISIT DUE TO COVID-19 - PATIENT WAS NOT SEEN IN THE OFFICE. PATIENT HAS CONSENTED TO VIRTUAL VISIT / TELEMEDICINE VISIT   Location of patient: home  Location of provider: office  I discussed the limitations of evaluation and management by telemedicine and the availability of in person appointments. The patient expressed understanding and agreed to proceed.   Subjective:   [name] is a 68 y.o. female who presents for Medicare Annual (Subsequent) preventive examination.  Review of Systems: No ROS.  Medicare Wellness Virtual Visit.  Visual/audio telehealth visit, UTA vital signs.   See social history for additional risk factors.   Sleep patterns:  Home Safety/Smoke Alarms: Feels safe in home. Smoke alarms in place.  Lives with husband in 2 story home. Does well with stairs.  Female:   Pap-   09/14/16    Mammo-  02/02/18     Dexa scan-        CCS- done 05/28/15 at [facility] per pt.     Objective:     Vitals: LMP 12/28/2011   There is no height or weight on file to calculate BMI.  Advanced Directives 09/09/2017 09/08/2016 12/13/2014 05/05/2012  Does Patient Have a Medical Advance Directive? No Yes No Patient has advance directive, copy not in chart  Type of Advance Directive - [place];Living will - Living will  Copy of [place] in Chart? - No - copy requested - -  Would patient like information on creating a medical advance directive? Yes (MAU/Ambulatory/Procedural Areas - Information given) - Yes - Educational materials given -  Pre-existing out of facility DNR order (yellow form or pink MOST form) - - - No    Tobacco Social History   Tobacco Use  Smoking Status Former Smoker  . Quit date: 08/09/1986  .  Years since quitting: 32.1  Smokeless Tobacco Never Used     Counseling given: Not Answered   Clinical Intake:                       Past Medical History:  Diagnosis Date  . Allergy    seasonal  . Arachnoiditis   . Depression   . Elevated liver enzymes   . Environmental allergies   . GERD (gastroesophageal reflux disease)   . H/O being hospitalized 03/2012   x1 week for nausea  . History of chicken pox   . Hypertension   . Migraine with typical aura    resolved years ago  . Nausea    chronic nausea    Past Surgical History:  Procedure Laterality Date  . ABDOMINOPLASTY  07/11/09  . EUS N/A 05/05/2012   Procedure: UPPER ENDOSCOPIC ULTRASOUND (EUS) LINEAR;  Surgeon: Beryle Beams, MD;  Location: [facility];  Service: Endoscopy;  Laterality: N/A;  . LAPAROSCOPIC CHOLECYSTECTOMY  2010  . NASAL SINUS SURGERY     "Has had 2-3 surgeries over the years"   Family History  Problem Relation Age of Onset  . Alzheimer's disease Mother   . Hypertension Mother   . Migraines Mother   . Hypertension Father   . Other Father        ?carotid artery aneurysm  . Cancer Brother  mouth and throat   Social History   Socioeconomic History  . Marital status: Married    Spouse name: Not on file  . Number of children: Not on file  . Years of education: Not on file  . Highest education level: Not on file  Occupational History  . Not on file  Social Needs  . Financial resource strain: Not on file  . Food insecurity    Worry: Not on file    Inability: Not on file  . Transportation needs    Medical: Not on file    Non-medical: Not on file  Tobacco Use  . Smoking status: Former Smoker    Quit date: 08/09/1986    Years since quitting: 32.1  . Smokeless tobacco: Never Used  Substance and Sexual Activity  . Alcohol use: No  . Drug use: No  . Sexual activity: Not Currently    Partners: Male    Birth control/protection: Other-see comments    Comment: husband had  vasectomy.   Lifestyle  . Physical activity    Days per week: Not on file    Minutes per session: Not on file  . Stress: Not on file  Relationships  . Social Herbalist on phone: Not on file    Gets together: Not on file    Attends religious service: Not on file    Active member of club or organization: Not on file    Attends meetings of clubs or organizations: Not on file    Relationship status: Not on file  Other Topics Concern  . Not on file  Social History Narrative   2 step daughters- 68 and 34   She worked as an Web designer.   Enjoys puzzles, counted cross stitch.   Completed 1 year of college   4 cats    Outpatient Encounter Medications as of 09/14/2018  Medication Sig  . amLODipine (NORVASC) 5 MG tablet Take 1 tablet (5 mg total) by mouth daily.  Marland Kitchen amphetamine-dextroamphetamine (ADDERALL) 20 MG tablet Take 20 mg by mouth daily.  . clonazePAM (KLONOPIN) 1 MG tablet TAKE 1 TABLET BY MOUTH THREE TIMES A DAY AS NEEDED FOR ANXIETY  . mirtazapine (REMERON) 45 MG tablet   . montelukast (SINGULAIR) 10 MG tablet Take 1 tablet (10 mg total) by mouth at bedtime. (Patient not taking: Reported on 09/04/2018)  . omeprazole (PRILOSEC) 40 MG capsule Take 1 capsule (40 mg total) by mouth daily.  . ondansetron (ZOFRAN-ODT) 8 MG disintegrating tablet DISSOLVE 1 TABLET BY MOUTH EVERY 8 HOURS AS NEEDED FOR NAUSEA OR VOMITING (Patient not taking: Reported on 09/04/2018)  . PARoxetine (PAXIL) 40 MG tablet TAKE 1 TABLET BY MOUTH  EVERY MORNING  . tiZANidine (ZANAFLEX) 4 MG capsule Take 4 mg by mouth 3 (three) times daily.  . traZODone (DESYREL) 50 MG tablet    No facility-administered encounter medications on file as of 09/14/2018.     Activities of Daily Living No flowsheet data found.  Patient Care Team: [name], NP as PCP - General (Internal Medicine) Renee Pain, MD as Referring Physician (Plastic Surgery) Cleta Alberts, MD as Referring Physician  (Neurology) Juanita Craver, MD as Consulting Physician (Gastroenterology)    Assessment:   This is a routine wellness examination for Maye. Physical assessment deferred to PCP.  Exercise Activities and Dietary recommendations   Diet (meal preparation, eat out, water intake, caffeinated beverages, dairy products, fruits and vegetables): {Desc; diets:16563} Breakfast: Lunch:  Dinner:  Goals    . Continue involvement in social activities    . Would like to feel 100% (pt-stated)       Fall Risk Fall Risk  09/09/2017 09/08/2016 03/19/2016 06/13/2015 06/13/2015  Falls in the past year? No Yes No No No  Number falls in past yr: - 1 - - -  Injury with Fall? - No - - -    Depression Screen PHQ 2/9 Scores 06/27/2018 11/11/2017 09/09/2017 12/15/2016  PHQ - 2 Score 2 2 1 6   PHQ- 9 Score 10 12 - 21  Exception Documentation - - - -     Cognitive Function Ad8 score reviewed for issues:  Issues making decisions:  Less interest in hobbies / activities:  Repeats questions, stories (family complaining):  Trouble using ordinary gadgets (microwave, computer, phone):  Forgets the month or year:   Mismanaging finances:   Remembering appts:  Daily problems with thinking and/or memory: Ad8 score is=     MMSE - Mini Mental State Exam 09/09/2017  Orientation to time 5  Orientation to Place 5  Registration 3  Attention/ Calculation 5  Recall 3  Language- name 2 objects 2  Language- repeat 1  Language- follow 3 step command 3  Language- read & follow direction 1  Write a sentence 1  Copy design 1  Total score 30        Immunization History  Administered Date(s) Administered  . Influenza, High Dose Seasonal PF 01/09/2016, 12/15/2016, 12/14/2017  . Influenza,inj,Quad PF,6+ Mos 12/25/2013, 12/13/2014  . Pneumococcal Conjugate-13 02/17/2018  . Pneumococcal Polysaccharide-23 12/13/2014  . Td 06/06/2008  . Zoster 01/15/2011   Screening Tests Health Maintenance  Topic Date  Due  . DEXA SCAN  12/28/2015  . TETANUS/TDAP  06/07/2018  . INFLUENZA VACCINE  10/28/2018  . PNA vac Low Risk Adult (2 of 2 - PPSV23) 12/13/2019  . MAMMOGRAM  02/03/2020  . COLONOSCOPY  05/27/2025  . Hepatitis C Screening  Completed      Plan:   ***   I have personally reviewed and noted the following in the patient's chart:   . Medical and social history . Use of alcohol, tobacco or illicit drugs  . Current medications and supplements . Functional ability and status . Nutritional status . Physical activity . Advanced directives . List of other physicians . Hospitalizations, surgeries, and ER visits in previous 12 months . Vitals . Screenings to include cognitive, depression, and falls . Referrals and appointments  In addition, I have reviewed and discussed with patient certain preventive protocols, quality metrics, and best practice recommendations. A written personalized care plan for preventive services as well as general preventive health recommendations were provided to patient.     Shela Nevin, [place]  09/13/2018

## 2018-09-14 ENCOUNTER — Ambulatory Visit: Payer: Self-pay | Admitting: *Deleted

## 2018-09-25 ENCOUNTER — Other Ambulatory Visit: Payer: Self-pay | Admitting: Family

## 2018-09-25 ENCOUNTER — Ambulatory Visit: Payer: 59 | Admitting: *Deleted

## 2018-10-16 ENCOUNTER — Ambulatory Visit: Payer: 59 | Admitting: Obstetrics & Gynecology

## 2018-10-18 ENCOUNTER — Telehealth: Payer: Self-pay | Admitting: Family

## 2018-10-18 NOTE — Telephone Encounter (Signed)
Needs appt

## 2018-10-18 NOTE — Telephone Encounter (Signed)
Pt stated her allergies have become very active for the last 2 weeks. She is requesting a refill of medications (She couldn't remember the exact names) that [place] prescribed the last time she had allergy issues. Please advise.  [id number] [address], [place] [phone] (Phone) [phone] (Fax)

## 2018-10-19 ENCOUNTER — Other Ambulatory Visit: Payer: Self-pay

## 2018-10-19 ENCOUNTER — Ambulatory Visit (INDEPENDENT_AMBULATORY_CARE_PROVIDER_SITE_OTHER): Payer: 59 | Admitting: Family Medicine

## 2018-10-19 DIAGNOSIS — R51 Headache: Secondary | ICD-10-CM | POA: Diagnosis not present

## 2018-10-19 DIAGNOSIS — J329 Chronic sinusitis, unspecified: Secondary | ICD-10-CM | POA: Insufficient documentation

## 2018-10-19 DIAGNOSIS — R0981 Nasal congestion: Secondary | ICD-10-CM | POA: Diagnosis not present

## 2018-10-19 DIAGNOSIS — J309 Allergic rhinitis, unspecified: Secondary | ICD-10-CM

## 2018-10-19 DIAGNOSIS — I10 Essential (primary) hypertension: Secondary | ICD-10-CM | POA: Diagnosis not present

## 2018-10-19 DIAGNOSIS — J019 Acute sinusitis, unspecified: Secondary | ICD-10-CM | POA: Insufficient documentation

## 2018-10-19 DIAGNOSIS — R519 Headache, unspecified: Secondary | ICD-10-CM

## 2018-10-19 MED ORDER — CAPITAL/CODEINE 120-12 MG/5ML PO SUSP: 5.0000 mL | Freq: Four times a day (QID) | ORAL | 0 refills | Status: DC | PRN

## 2018-10-19 MED ORDER — AMOXICILLIN-POT CLAVULANATE 875-125 MG PO TABS: 1.0000 | ORAL_TABLET | Freq: Two times a day (BID) | ORAL | 0 refills | Status: DC

## 2018-10-19 NOTE — Progress Notes (Signed)
Virtual Visit via Video Note  I connected with [name] on 10/19/18 at 10:20 AM EDT by a video enabled telemedicine application and verified that I am speaking with the correct person using two identifiers.  Location: Patient: home Provider: home   I discussed the limitations of evaluation and management by telemedicine and the availability of in person appointments. The patient expressed understanding and agreed to proceed. [name], CMA was able to get the patient set up on visit, video    Subjective:    Patient ID: [name], female    DOB: [birth year], 68 y.o.   MRN: [id number]  No chief complaint on file.   HPI Patient is in today for evaluation of headache and congestion. She describes her head as feeling full of concrete. She has nasal congestion and postnasal drip. She has a cough, malaise and headache.  She notes a history of congestion caused by allergies but this is worse than usual.  She has significant fatigue as well as some nausea and malaise.  She has had some intermittent trouble with diarrhea and constipation but that is her normal.  She has tried some elderberry with no real benefit.  And finally she notes some sore throat headaches and ear aches as well. Denies CP/palp/SOB/fevers or GU c/o. Taking meds as prescribed  Past Medical History:  Diagnosis Date  . Allergy    seasonal  . Arachnoiditis   . Depression   . Elevated liver enzymes   . Environmental allergies   . GERD (gastroesophageal reflux disease)   . H/O being hospitalized 03/2012   x1 week for nausea  . History of chicken pox   . Hypertension   . Migraine with typical aura    resolved years ago  . Nausea    chronic nausea     Past Surgical History:  Procedure Laterality Date  . ABDOMINOPLASTY  07/11/09  . EUS N/A 05/05/2012   Procedure: UPPER ENDOSCOPIC ULTRASOUND (EUS) LINEAR;  Surgeon: Beryle Beams, MD;  Location: [facility];  Service: Endoscopy;  Laterality: N/A;  . LAPAROSCOPIC  CHOLECYSTECTOMY  2010  . NASAL SINUS SURGERY     "Has had 2-3 surgeries over the years"    Family History  Problem Relation Age of Onset  . Alzheimer's disease Mother   . Hypertension Mother   . Migraines Mother   . Hypertension Father   . Other Father        ?carotid artery aneurysm  . Cancer Brother        mouth and throat    Social History   Socioeconomic History  . Marital status: Married    Spouse name: Not on file  . Number of children: Not on file  . Years of education: Not on file  . Highest education level: Not on file  Occupational History  . Not on file  Social Needs  . Financial resource strain: Not on file  . Food insecurity    Worry: Not on file    Inability: Not on file  . Transportation needs    Medical: Not on file    Non-medical: Not on file  Tobacco Use  . Smoking status: Former Smoker    Quit date: 08/09/1986    Years since quitting: 32.2  . Smokeless tobacco: Never Used  Substance and Sexual Activity  . Alcohol use: No  . Drug use: No  . Sexual activity: Not Currently    Partners: Male    Birth control/protection: Other-see comments  Comment: husband had vasectomy.   Lifestyle  . Physical activity    Days per week: Not on file    Minutes per session: Not on file  . Stress: Not on file  Relationships  . Social Herbalist on phone: Not on file    Gets together: Not on file    Attends religious service: Not on file    Active member of club or organization: Not on file    Attends meetings of clubs or organizations: Not on file    Relationship status: Not on file  . Intimate partner violence    Fear of current or ex partner: Not on file    Emotionally abused: Not on file    Physically abused: Not on file    Forced sexual activity: Not on file  Other Topics Concern  . Not on file  Social History Narrative   2 step daughters- 53 and 89   She worked as an Web designer.   Enjoys puzzles, counted cross stitch.    Completed 1 year of college   4 cats    Outpatient Medications Prior to Visit  Medication Sig Dispense Refill  . amLODipine (NORVASC) 5 MG tablet TAKE 1 TABLET BY MOUTH EVERY DAY.MAX ON INSURANCE 30 tablet 2  . amphetamine-dextroamphetamine (ADDERALL) 20 MG tablet Take 20 mg by mouth daily.    . clonazePAM (KLONOPIN) 1 MG tablet TAKE 1 TABLET BY MOUTH THREE TIMES A DAY AS NEEDED FOR ANXIETY 90 tablet 0  . mirtazapine (REMERON) 45 MG tablet     . montelukast (SINGULAIR) 10 MG tablet Take 1 tablet (10 mg total) by mouth at bedtime. (Patient not taking: Reported on 09/04/2018) 30 tablet 3  . omeprazole (PRILOSEC) 40 MG capsule Take 1 capsule (40 mg total) by mouth daily. 90 capsule 3  . ondansetron (ZOFRAN-ODT) 8 MG disintegrating tablet DISSOLVE 1 TABLET BY MOUTH EVERY 8 HOURS AS NEEDED FOR NAUSEA OR VOMITING (Patient not taking: Reported on 09/04/2018) 90 tablet 0  . PARoxetine (PAXIL) 40 MG tablet TAKE 1 TABLET BY MOUTH  EVERY MORNING 90 tablet 1  . tiZANidine (ZANAFLEX) 4 MG capsule Take 4 mg by mouth 3 (three) times daily.    . traZODone (DESYREL) 50 MG tablet      No facility-administered medications prior to visit.     Allergies  Allergen Reactions  . Compazine [Prochlorperazine]     hyperactivity  . Morphine And Related Hives and Itching  . Trintellix [Vortioxetine] Nausea And Vomiting    Review of Systems  Constitutional: Positive for malaise/fatigue. Negative for chills and fever.  HENT: Positive for congestion and sinus pain.   Eyes: Negative for blurred vision.  Respiratory: Positive for cough and sputum production. Negative for shortness of breath.   Cardiovascular: Negative for chest pain, palpitations and leg swelling.  Gastrointestinal: Negative for abdominal pain, blood in stool and nausea.  Genitourinary: Negative for dysuria and frequency.  Musculoskeletal: Positive for myalgias. Negative for falls.  Skin: Negative for rash.  Neurological: Positive for headaches.  Negative for dizziness and loss of consciousness.  Endo/Heme/Allergies: Negative for environmental allergies.  Psychiatric/Behavioral: Negative for depression. The patient is not nervous/anxious.        Objective:    Physical Exam Constitutional:      Appearance: Normal appearance.  HENT:     Head: Normocephalic and atraumatic.  Eyes:     General:        Right eye: No discharge.  Left eye: No discharge.  Pulmonary:     Effort: Pulmonary effort is normal.  Neurological:     Mental Status: She is alert and oriented to person, place, and time.  Psychiatric:        Mood and Affect: Mood normal.        Behavior: Behavior normal.     LMP 12/28/2011  Wt Readings from Last 3 Encounters:  09/04/18 162 lb 1.9 oz (73.5 kg)  06/07/18 159 lb (72.1 kg)  04/19/18 159 lb (72.1 kg)    Diabetic Foot Exam - Simple   No data filed     Lab Results  Component Value Date   WBC 10.9 (H) 11/19/2008   HGB 14.0 11/19/2008   HCT 39.9 11/19/2008   PLT 91 (L) 11/19/2008   GLUCOSE 102 (H) 06/13/2015   CHOL 203 (H) 06/13/2015   TRIG 189.0 (H) 06/13/2015   HDL 49.10 06/13/2015   LDLCALC 116 (H) 06/13/2015   ALT 18 11/19/2008   AST 25 11/19/2008   NA 141 06/13/2015   K 3.6 06/13/2015   CL 104 06/13/2015   CREATININE 0.96 06/13/2015   BUN 17 06/13/2015   CO2 26 06/13/2015    No results found for: TSH Lab Results  Component Value Date   WBC 10.9 (H) 11/19/2008   HGB 14.0 11/19/2008   HCT 39.9 11/19/2008   MCV 90.5 11/19/2008   PLT 91 (L) 11/19/2008   Lab Results  Component Value Date   NA 141 06/13/2015   K 3.6 06/13/2015   CO2 26 06/13/2015   GLUCOSE 102 (H) 06/13/2015   BUN 17 06/13/2015   CREATININE 0.96 06/13/2015   BILITOT 0.4 11/19/2008   ALKPHOS 106 11/19/2008   AST 25 11/19/2008   ALT 18 11/19/2008   PROT 7.3 11/19/2008   ALBUMIN 4.1 11/19/2008   CALCIUM 9.4 06/13/2015   GFR 62.10 06/13/2015   Lab Results  Component Value Date   CHOL 203 (H) 06/13/2015    Lab Results  Component Value Date   HDL 49.10 06/13/2015   Lab Results  Component Value Date   LDLCALC 116 (H) 06/13/2015   Lab Results  Component Value Date   TRIG 189.0 (H) 06/13/2015   Lab Results  Component Value Date   CHOLHDL 4 06/13/2015   No results found for: HGBA1C     Assessment & Plan:   Problem List Items Addressed This Visit    Allergic rhinitis    Continue Singulair and add Cetirizine and Flonase.       Hypertension    no changes to meds. Encouraged heart healthy diet such as the DASH diet and exercise as tolerated.       Sinusitis    Encouraged increased rest and hydration, add probiotics, zinc such as Coldeze or Xicam. Treat fevers as needed. Mucinex, probiotic, started on Augmentin and also sent for COVID testing, she is aware she must maintain quarantine while she awaits her results      Relevant Medications   amoxicillin-clavulanate (AUGMENTIN) 875-125 MG tablet    Other Visit Diagnoses    Frequent headaches    -  Primary   Relevant Medications   acetaminophen-codeine (CAPITAL/CODEINE) 120-12 MG/5ML suspension   Other Relevant Orders   Novel Coronavirus, NAA ([facility])   Nasal congestion       Relevant Orders   Novel Coronavirus, NAA ([facility])      I am having [name] start on amoxicillin-clavulanate and [place]/Codeine. I am also having her maintain her  clonazePAM, ondansetron, amphetamine-dextroamphetamine, omeprazole, PARoxetine, traZODone, mirtazapine, montelukast, tiZANidine, and amLODipine.  Meds ordered this encounter  Medications  . amoxicillin-clavulanate (AUGMENTIN) 875-125 MG tablet    Sig: Take 1 tablet by mouth 2 (two) times daily.    Dispense:  20 tablet    Refill:  0  . acetaminophen-codeine (CAPITAL/CODEINE) 120-12 MG/5ML suspension    Sig: Take 5 mLs by mouth every 6 (six) hours as needed for pain.    Dispense:  120 mL    Refill:  0    I discussed the assessment and treatment plan with the patient. The patient  was provided an opportunity to ask questions and all were answered. The patient agreed with the plan and demonstrated an understanding of the instructions.   The patient was advised to call back or seek an in-person evaluation if the symptoms worsen or if the condition fails to improve as anticipated.  I provided 25 minutes of non-face-to-face time during this encounter.   Penni Homans, MD

## 2018-10-19 NOTE — Assessment & Plan Note (Signed)
Encouraged increased rest and hydration, add probiotics, zinc such as Coldeze or Xicam. Treat fevers as needed. Mucinex, probiotic, started on Augmentin and also sent for COVID testing, she is aware she must maintain quarantine while she awaits her results

## 2018-10-19 NOTE — Assessment & Plan Note (Signed)
no changes to meds. Encouraged heart healthy diet such as the DASH diet and exercise as tolerated.  

## 2018-10-19 NOTE — Assessment & Plan Note (Signed)
Continue Singulair and add Cetirizine and Flonase.

## 2018-10-20 ENCOUNTER — Other Ambulatory Visit: Payer: Self-pay

## 2018-10-20 DIAGNOSIS — Z20822 Contact with and (suspected) exposure to covid-19: Secondary | ICD-10-CM

## 2018-10-23 ENCOUNTER — Telehealth: Payer: Self-pay

## 2018-10-23 ENCOUNTER — Other Ambulatory Visit: Payer: Self-pay | Admitting: Family Medicine

## 2018-10-23 LAB — NOVEL CORONAVIRUS, NAA: SARS-CoV-2, NAA: NOT DETECTED

## 2018-10-23 MED ORDER — BENZONATATE 200 MG PO CAPS: 200.0000 mg | ORAL_CAPSULE | Freq: Three times a day (TID) | ORAL | 0 refills | Status: DC | PRN

## 2018-10-23 MED FILL — BENZONATATE 200 MG CAP: 200 | 14 days supply | Qty: 40 | Fill #0

## 2018-10-23 NOTE — Telephone Encounter (Signed)
Copied from [place] [id number]. Topic: General - Other >> Oct 23, 2018  9:41 AM Leward Quan A wrote: Reason for CRM: Patient called to inform Dr Charlett Blake that her cough is back and it is in her chest and her throat saying that it is heavy and unproductive. Asking for something sent to the pharmacy please to help with this cough. [id number] [address], [place] [phone] (Phone) [phone] (Fax)  Call patient at Ph# [phone]

## 2018-10-23 NOTE — Telephone Encounter (Signed)
I have sent some Tessalon Perles to [place] let her know please

## 2018-10-24 NOTE — Telephone Encounter (Signed)
Called patient left voicemail for patient to call the office back.]\  Nurse triage may handle  

## 2018-10-30 ENCOUNTER — Other Ambulatory Visit: Payer: Self-pay

## 2018-11-22 ENCOUNTER — Other Ambulatory Visit: Payer: Self-pay | Admitting: Family Medicine

## 2018-11-22 MED FILL — BENZONATATE 200 MG CAP: 200 | 13 days supply | Qty: 40 | Fill #0

## 2018-12-08 ENCOUNTER — Encounter: Payer: Self-pay | Admitting: Family

## 2018-12-11 MED ORDER — SHINGRIX 50 MCG/0.5ML IM SUSR: INTRAMUSCULAR | 1 refills | Status: DC

## 2018-12-20 ENCOUNTER — Encounter: Payer: Self-pay | Admitting: Family

## 2018-12-20 ENCOUNTER — Other Ambulatory Visit: Payer: Self-pay

## 2018-12-20 ENCOUNTER — Ambulatory Visit (INDEPENDENT_AMBULATORY_CARE_PROVIDER_SITE_OTHER): Payer: 59 | Admitting: Family

## 2018-12-20 VITALS — BP 131/73 | HR 87 | Temp 97.5°F | Resp 16 | Ht 62.0 in | Wt 167.0 lb

## 2018-12-20 DIAGNOSIS — L03011 Cellulitis of right finger: Secondary | ICD-10-CM | POA: Diagnosis not present

## 2018-12-20 DIAGNOSIS — R10816 Epigastric abdominal tenderness: Secondary | ICD-10-CM | POA: Diagnosis not present

## 2018-12-20 MED ORDER — CEPHALEXIN 500 MG PO CAPS: 500.0000 mg | ORAL_CAPSULE | Freq: Three times a day (TID) | ORAL | 0 refills | Status: DC

## 2018-12-20 MED FILL — CEPHALEXIN 500 MG CAPSULE: 500 | 7 days supply | Qty: 21 | Fill #0

## 2018-12-20 NOTE — Progress Notes (Signed)
Subjective:    Patient ID: [name], female    DOB: Apr 11, 1950, 68 y.o.   MRN: [id number]  HPI  Patient is a 68 yr old female who presents today with chief complaint of infection of the right small finger. Not certain if she  Notes that she scrolls up with her pink and wonders if she got a piece of glass.    Reports recent increase in her nausea, +reflux.  Feels "like food just sits there." she has a long standing history of chronic nausea. Feels like nausea worsened after a recent antibiotic rx.   Review of Systems  See HPI  Past Medical History:  Diagnosis Date  . Allergy    seasonal  . Arachnoiditis   . Depression   . Elevated liver enzymes   . Environmental allergies   . GERD (gastroesophageal reflux disease)   . H/O being hospitalized 03/2012   x1 week for nausea  . History of chicken pox   . Hypertension   . Migraine with typical aura    resolved years ago  . Nausea    chronic nausea      Social History   Socioeconomic History  . Marital status: Married    Spouse name: Not on file  . Number of children: Not on file  . Years of education: Not on file  . Highest education level: Not on file  Occupational History  . Not on file  Social Needs  . Financial resource strain: Not on file  . Food insecurity    Worry: Not on file    Inability: Not on file  . Transportation needs    Medical: Not on file    Non-medical: Not on file  Tobacco Use  . Smoking status: Former Smoker    Quit date: 08/09/1986    Years since quitting: 32.3  . Smokeless tobacco: Never Used  Substance and Sexual Activity  . Alcohol use: No  . Drug use: No  . Sexual activity: Not Currently    Partners: Male    Birth control/protection: Other-see comments    Comment: husband had vasectomy.   Lifestyle  . Physical activity    Days per week: Not on file    Minutes per session: Not on file  . Stress: Not on file  Relationships  . Social Herbalist on phone: Not on file   Gets together: Not on file    Attends religious service: Not on file    Active member of club or organization: Not on file    Attends meetings of clubs or organizations: Not on file    Relationship status: Not on file  . Intimate partner violence    Fear of current or ex partner: Not on file    Emotionally abused: Not on file    Physically abused: Not on file    Forced sexual activity: Not on file  Other Topics Concern  . Not on file  Social History Narrative   2 step daughters- 43 and 21   She worked as an Web designer.   Enjoys puzzles, counted cross stitch.   Completed 1 year of college   4 cats    Past Surgical History:  Procedure Laterality Date  . ABDOMINOPLASTY  07/11/09  . EUS N/A 05/05/2012   Procedure: UPPER ENDOSCOPIC ULTRASOUND (EUS) LINEAR;  Surgeon: Beryle Beams, MD;  Location: [facility];  Service: Endoscopy;  Laterality: N/A;  . LAPAROSCOPIC CHOLECYSTECTOMY  2010  . NASAL SINUS  SURGERY     "Has had 2-3 surgeries over the years"    Family History  Problem Relation Age of Onset  . Alzheimer's disease Mother   . Hypertension Mother   . Migraines Mother   . Hypertension Father   . Other Father        ?carotid artery aneurysm  . Cancer Brother        mouth and throat    Allergies  Allergen Reactions  . Compazine [Prochlorperazine]     hyperactivity  . Morphine And Related Hives and Itching  . Trintellix [Vortioxetine] Nausea And Vomiting    Current Outpatient Medications on File Prior to Visit  Medication Sig Dispense Refill  . acetaminophen-codeine (CAPITAL/CODEINE) 120-12 MG/5ML suspension Take 5 mLs by mouth every 6 (six) hours as needed for pain. 120 mL 0  . amLODipine (NORVASC) 5 MG tablet TAKE 1 TABLET BY MOUTH EVERY DAY.MAX ON INSURANCE 30 tablet 2  . amphetamine-dextroamphetamine (ADDERALL) 20 MG tablet Take 20 mg by mouth daily.    . clonazePAM (KLONOPIN) 1 MG tablet TAKE 1 TABLET BY MOUTH THREE TIMES A DAY AS NEEDED FOR ANXIETY  90 tablet 0  . mirtazapine (REMERON) 45 MG tablet     . omeprazole (PRILOSEC) 40 MG capsule Take 1 capsule (40 mg total) by mouth daily. 90 capsule 3  . PARoxetine (PAXIL) 40 MG tablet TAKE 1 TABLET BY MOUTH  EVERY MORNING 90 tablet 1  . traZODone (DESYREL) 50 MG tablet     . Zoster Vaccine Adjuvanted [facility]) injection Inject 0.5mg  IM now and again in 2-6 months. 0.5 mL 1   No current facility-administered medications on file prior to visit.     BP 131/73 (BP Location: Right Arm, Patient Position: Sitting, Cuff Size: Small)   Pulse 87   Temp (!) 97.5 F (36.4 C) (Temporal)   Resp 16   Ht 5\' 2"  (1.575 m)   Wt 167 lb (75.8 kg)   LMP 12/28/2011   SpO2 96%   BMI 30.54 kg/m        Objective:   Physical Exam Constitutional:      Appearance: She is well-developed.  Neck:     Musculoskeletal: Neck supple.     Thyroid: No thyromegaly.  Cardiovascular:     Rate and Rhythm: Normal rate and regular rhythm.     Heart sounds: Normal heart sounds. No murmur.  Pulmonary:     Effort: Pulmonary effort is normal. No respiratory distress.     Breath sounds: Normal breath sounds. No wheezing.  Abdominal:     Tenderness: There is abdominal tenderness.  Skin:    General: Skin is warm and dry.     Comments: + erythema and swelling around the nailbed on the right small finger  Neurological:     Mental Status: She is alert and oriented to person, place, and time.  Psychiatric:        Behavior: Behavior normal.        Thought Content: Thought content normal.        Judgment: Judgment normal.           Assessment & Plan:  Epigastric tenderness- chronic nausea- will obtain abdominal ultrasound and refer to GI for further evaluation. Continue PPI.  Paronychia- will rx with keflex. I have advised pt to send me an updated photo via mychart in 1 week.

## 2018-12-20 NOTE — Patient Instructions (Signed)
Begin keflex for skin infection. Call if symptoms worsen or if they fail to improve. You should be contacted about scheduling your ultrasound and GI evaluation.

## 2018-12-25 ENCOUNTER — Other Ambulatory Visit: Payer: Self-pay | Admitting: Family

## 2019-01-01 ENCOUNTER — Ambulatory Visit (HOSPITAL_BASED_OUTPATIENT_CLINIC_OR_DEPARTMENT_OTHER)
Admission: RE | Admit: 2019-01-01 | Discharge: 2019-01-01 | Disposition: A | Discharge status: Home / Self Care | Payer: 59 | Source: Ambulatory Visit | Attending: Family | Admitting: Family

## 2019-01-01 ENCOUNTER — Other Ambulatory Visit: Payer: Self-pay

## 2019-01-01 DIAGNOSIS — R10816 Epigastric abdominal tenderness: Secondary | ICD-10-CM | POA: Insufficient documentation

## 2019-01-01 DIAGNOSIS — K760 Fatty (change of) liver, not elsewhere classified: Secondary | ICD-10-CM | POA: Diagnosis not present

## 2019-01-02 ENCOUNTER — Telehealth: Payer: Self-pay | Admitting: Family

## 2019-01-02 DIAGNOSIS — N1330 Unspecified hydronephrosis: Secondary | ICD-10-CM

## 2019-01-02 DIAGNOSIS — K219 Gastro-esophageal reflux disease without esophagitis: Secondary | ICD-10-CM

## 2019-01-02 DIAGNOSIS — N2889 Other specified disorders of kidney and ureter: Secondary | ICD-10-CM

## 2019-01-02 NOTE — Telephone Encounter (Signed)
Reviewed US findings with pt and recommendation to complete MRI for further evaluation of the renal lesion.    Also, we discussed ?mild hydronephrosis on [place]. She denies any voiding difficulties.  She would like referral to GI for ongoing GI issues (bloating, gerd, nausea). Referral placed.  Rod Holler, can you please call the patient to schedule a lab visit for a bmet to check her kidney function?

## 2019-01-03 ENCOUNTER — Telehealth: Payer: Self-pay | Admitting: Family

## 2019-01-03 ENCOUNTER — Encounter: Payer: Self-pay | Admitting: Gastroenterology

## 2019-01-03 DIAGNOSIS — N2889 Other specified disorders of kidney and ureter: Secondary | ICD-10-CM

## 2019-01-03 NOTE — Telephone Encounter (Signed)
Spoke with [facility] MD and received authorization number for her MRI. He recommended and approved MRI w and w/o contrast.  [id number] exp 02/17/2019.

## 2019-01-03 NOTE — Telephone Encounter (Signed)
Thanks referral process

## 2019-01-03 NOTE — Telephone Encounter (Signed)
Patient will be here tomorrow afternoon for BMET

## 2019-01-04 ENCOUNTER — Other Ambulatory Visit (INDEPENDENT_AMBULATORY_CARE_PROVIDER_SITE_OTHER): Payer: 59

## 2019-01-04 ENCOUNTER — Encounter: Payer: Self-pay | Admitting: Medical

## 2019-01-04 ENCOUNTER — Ambulatory Visit (INDEPENDENT_AMBULATORY_CARE_PROVIDER_SITE_OTHER): Payer: 59 | Admitting: Medical

## 2019-01-04 ENCOUNTER — Other Ambulatory Visit: Payer: Self-pay

## 2019-01-04 VITALS — BP 138/64 | HR 85 | Temp 97.9°F | Resp 16 | Ht 62.0 in | Wt 164.2 lb

## 2019-01-04 DIAGNOSIS — L309 Dermatitis, unspecified: Secondary | ICD-10-CM | POA: Diagnosis not present

## 2019-01-04 DIAGNOSIS — M79644 Pain in right finger(s): Secondary | ICD-10-CM | POA: Diagnosis not present

## 2019-01-04 DIAGNOSIS — N1330 Unspecified hydronephrosis: Secondary | ICD-10-CM | POA: Diagnosis not present

## 2019-01-04 DIAGNOSIS — L089 Local infection of the skin and subcutaneous tissue, unspecified: Secondary | ICD-10-CM | POA: Diagnosis not present

## 2019-01-04 MED ORDER — TRIAMCINOLONE ACETONIDE 0.025 % EX OINT: 1.0000 "application " | TOPICAL_OINTMENT | Freq: Two times a day (BID) | CUTANEOUS | 0 refills | Status: DC

## 2019-01-04 MED ORDER — DOXYCYCLINE HYCLATE 100 MG PO TABS: 100.0000 mg | ORAL_TABLET | Freq: Two times a day (BID) | ORAL | 0 refills | Status: DC

## 2019-01-04 MED FILL — DOXYCYCLINE HYCLATE 100 MG: 100 | 7 days supply | Qty: 14 | Fill #0

## 2019-01-04 MED FILL — TRIAMCINOLONE 0.025% OINT: 0.025 | 14 days supply | Qty: 30 | Fill #0

## 2019-01-04 NOTE — Progress Notes (Signed)
Subjective:    Patient ID: [name], female    DOB: Aug 21, 1950, 68 y.o.   MRN: [id number]  HPI  Pt has rash to her rt pinkey digit just beneath the nail. She has smart phone which had cracked screen. She was scrolling with side of her finger/and back side and she speculates might have gotten abrasion. Pt picked at area and had concern for glass in finger. She picked at area and saw no glass.  She soaked finger in peroxide 3 days in a row. Did not help.  Pt states he pcp put her on antibiotic for this but did not get better.  She was on antibiotic 23 rd of September. Did not respond to keflex.  Pt does wash her hands a lot. 6-8 times a day.    Review of Systems  Constitutional: Negative for chills, fatigue and fever.  Respiratory: Negative for chest tightness and shortness of breath.   Cardiovascular: Negative for chest pain and palpitations.  Skin:       See hpi.  Neurological: Negative for dizziness and light-headedness.  Hematological: Negative for adenopathy. Does not bruise/bleed easily.  Psychiatric/Behavioral: Negative for behavioral problems and confusion.    Past Medical History:  Diagnosis Date  . Allergy    seasonal  . Arachnoiditis   . Depression   . Elevated liver enzymes   . Environmental allergies   . GERD (gastroesophageal reflux disease)   . H/O being hospitalized 03/2012   x1 week for nausea  . History of chicken pox   . Hypertension   . Migraine with typical aura    resolved years ago  . Nausea    chronic nausea      Social History   Socioeconomic History  . Marital status: Married    Spouse name: Not on file  . Number of children: Not on file  . Years of education: Not on file  . Highest education level: Not on file  Occupational History  . Not on file  Social Needs  . Financial resource strain: Not on file  . Food insecurity    Worry: Not on file    Inability: Not on file  . Transportation needs    Medical: Not on file   Non-medical: Not on file  Tobacco Use  . Smoking status: Former Smoker    Quit date: 08/09/1986    Years since quitting: 32.4  . Smokeless tobacco: Never Used  Substance and Sexual Activity  . Alcohol use: No  . Drug use: No  . Sexual activity: Not Currently    Partners: Male    Birth control/protection: Other-see comments    Comment: husband had vasectomy.   Lifestyle  . Physical activity    Days per week: Not on file    Minutes per session: Not on file  . Stress: Not on file  Relationships  . Social Herbalist on phone: Not on file    Gets together: Not on file    Attends religious service: Not on file    Active member of club or organization: Not on file    Attends meetings of clubs or organizations: Not on file    Relationship status: Not on file  . Intimate partner violence    Fear of current or ex partner: Not on file    Emotionally abused: Not on file    Physically abused: Not on file    Forced sexual activity: Not on file  Other Topics Concern  .  Not on file  Social History Narrative   2 step daughters- 61 and 39   She worked as an Web designer.   Enjoys puzzles, counted cross stitch.   Completed 1 year of college   4 cats    Past Surgical History:  Procedure Laterality Date  . ABDOMINOPLASTY  07/11/09  . EUS N/A 05/05/2012   Procedure: UPPER ENDOSCOPIC ULTRASOUND (EUS) LINEAR;  Surgeon: Beryle Beams, MD;  Location: [facility];  Service: Endoscopy;  Laterality: N/A;  . LAPAROSCOPIC CHOLECYSTECTOMY  2010  . NASAL SINUS SURGERY     "Has had 2-3 surgeries over the years"    Family History  Problem Relation Age of Onset  . Alzheimer's disease Mother   . Hypertension Mother   . Migraines Mother   . Hypertension Father   . Other Father        ?carotid artery aneurysm  . Cancer Brother        mouth and throat    Allergies  Allergen Reactions  . Compazine [Prochlorperazine]     hyperactivity  . Morphine And Related Hives and  Itching  . Trintellix [Vortioxetine] Nausea And Vomiting    Current Outpatient Medications on File Prior to Visit  Medication Sig Dispense Refill  . amLODipine (NORVASC) 5 MG tablet TAKE 1 TABLET BY MOUTH EVERY DAY.MAX ON INSURANCE 30 tablet 2  . amphetamine-dextroamphetamine (ADDERALL) 20 MG tablet Take 20 mg by mouth daily.    . clonazePAM (KLONOPIN) 1 MG tablet TAKE 1 TABLET BY MOUTH THREE TIMES A DAY AS NEEDED FOR ANXIETY 90 tablet 0  . mirtazapine (REMERON) 45 MG tablet     . omeprazole (PRILOSEC) 40 MG capsule Take 1 capsule (40 mg total) by mouth daily. 90 capsule 3  . PARoxetine (PAXIL) 40 MG tablet TAKE 1 TABLET BY MOUTH  EVERY MORNING 90 tablet 1  . traZODone (DESYREL) 50 MG tablet     . Zoster Vaccine Adjuvanted [facility]) injection Inject 0.5mg  IM now and again in 2-6 months. 0.5 mL 1   No current facility-administered medications on file prior to visit.     BP 138/64   Pulse 85   Temp 97.9 F (36.6 C) (Temporal)   Resp 16   Ht 5\' 2"  (1.575 m)   Wt 164 lb 3.2 oz (74.5 kg)   LMP 12/28/2011   SpO2 97%   BMI 30.03 kg/m       Objective:   Physical Exam   General- No acute distress. Pleasant patient. Neck- Full range of motion, no jvd Lungs- Clear, even and unlabored. Heart- regular rate and rhythm. Neurologic- CNII- XII grossly intact.  Skin- rt 5th digit. Beneath finger nail red, dry appearance with flaky skin. Red area ends base of nail to dip joint.      Assessment & Plan:  With your reported history prior to onset of skin irritation and infection, I think it is possible that you have a small shard of glass present.  So please go downstairs for fifth digit x-ray.  Also with your described history and physical exam findings, I do think you might have some eczema with secondary bacterial infection.  At this point I think is best that she use doxycycline antibiotic.  It is possible Keflex did not have adequate coverage although it is a reasonable choice.   Also prescribing triamcinolone ointment to apply thin film twice daily.  Try to avoid the tiny area of open epidermis where you are picking at it earlier in the  week.  Also try to not excessively wash your hands.   Follow-up in 7 days or as needed.  If any rapid expansion of redness up your arm then recommend ED evaluation.   25 + minutes spent with patient today.  50% of time spent counseling patient on plan going forward regarding today's diagnosis. Mackie Pai, PA-C

## 2019-01-04 NOTE — Patient Instructions (Signed)
With your reported history prior to onset of skin irritation and infection, I think it is possible that you have a small shard of glass present.  So please go downstairs for fifth digit x-ray.  Also with your described history and physical exam findings, I do think you might have some eczema with secondary bacterial infection.  At this point I think is best that she use doxycycline antibiotic.  It is possible Keflex did not have adequate coverage although it is a reasonable choice.  Also prescribing triamcinolone ointment to apply thin film twice daily.  Try to avoid the tiny area of open epidermis where you are picking at it earlier in the week.  Also try to not excessively wash your hands.   Follow-up in 7 days or as needed.  If any rapid expansion of redness up your arm then recommend ED evaluation.

## 2019-01-05 LAB — BASIC METABOLIC PANEL
BUN: 17 mg/dL (ref 6–23)
CO2: 27 mEq/L (ref 19–32)
Calcium: 9.6 mg/dL (ref 8.4–10.5)
Chloride: 103 mEq/L (ref 96–112)
Creatinine, Ser: 0.88 mg/dL (ref 0.40–1.20)
GFR: 63.9 mL/min (ref >60.00)
Glucose, Bld: 82 mg/dL (ref 70–99)
Potassium: 4.4 mEq/L (ref 3.5–5.1)
Sodium: 138 mEq/L (ref 135–145)

## 2019-01-06 ENCOUNTER — Other Ambulatory Visit: Payer: Self-pay

## 2019-01-06 ENCOUNTER — Ambulatory Visit (HOSPITAL_BASED_OUTPATIENT_CLINIC_OR_DEPARTMENT_OTHER)
Admission: RE | Admit: 2019-01-06 | Discharge: 2019-01-06 | Disposition: A | Discharge status: Home / Self Care | Payer: 59 | Source: Ambulatory Visit | Attending: Family | Admitting: Family

## 2019-01-06 ENCOUNTER — Ambulatory Visit (HOSPITAL_BASED_OUTPATIENT_CLINIC_OR_DEPARTMENT_OTHER)
Admission: RE | Admit: 2019-01-06 | Discharge: 2019-01-06 | Disposition: A | Discharge status: Home / Self Care | Payer: 59 | Source: Ambulatory Visit | Attending: Medical | Admitting: Medical

## 2019-01-06 DIAGNOSIS — M79644 Pain in right finger(s): Secondary | ICD-10-CM | POA: Insufficient documentation

## 2019-01-06 DIAGNOSIS — M7989 Other specified soft tissue disorders: Secondary | ICD-10-CM | POA: Diagnosis not present

## 2019-01-06 DIAGNOSIS — D1771 Benign lipomatous neoplasm of kidney: Secondary | ICD-10-CM | POA: Diagnosis not present

## 2019-01-06 DIAGNOSIS — N2889 Other specified disorders of kidney and ureter: Secondary | ICD-10-CM | POA: Insufficient documentation

## 2019-01-06 DIAGNOSIS — S61216A Laceration without foreign body of right little finger without damage to nail, initial encounter: Secondary | ICD-10-CM | POA: Diagnosis not present

## 2019-01-06 MED ORDER — GADOBUTROL 1 MMOL/ML IV SOLN
7.5000 mL | Freq: Once | INTRAVENOUS | Status: AC | PRN
  Administered 2019-01-06: 7.5 mL via INTRAVENOUS

## 2019-01-08 DIAGNOSIS — H2511 Age-related nuclear cataract, right eye: Secondary | ICD-10-CM | POA: Diagnosis not present

## 2019-01-11 ENCOUNTER — Other Ambulatory Visit: Payer: Self-pay

## 2019-01-12 ENCOUNTER — Ambulatory Visit: Payer: 59 | Admitting: Medical

## 2019-01-16 ENCOUNTER — Ambulatory Visit (INDEPENDENT_AMBULATORY_CARE_PROVIDER_SITE_OTHER): Payer: 59 | Admitting: Medical

## 2019-01-16 ENCOUNTER — Other Ambulatory Visit: Payer: Self-pay

## 2019-01-16 ENCOUNTER — Encounter: Payer: Self-pay | Admitting: Medical

## 2019-01-16 VITALS — BP 156/72 | HR 91 | Temp 97.8°F | Resp 16 | Ht 62.0 in | Wt 166.8 lb

## 2019-01-16 DIAGNOSIS — L309 Dermatitis, unspecified: Secondary | ICD-10-CM | POA: Diagnosis not present

## 2019-01-16 DIAGNOSIS — R21 Rash and other nonspecific skin eruption: Secondary | ICD-10-CM | POA: Diagnosis not present

## 2019-01-16 MED ORDER — CLOTRIMAZOLE-BETAMETHASONE 1-0.05 % EX CREA: 1.0000 "application " | TOPICAL_CREAM | Freq: Two times a day (BID) | CUTANEOUS | 0 refills | Status: DC

## 2019-01-16 MED FILL — CLOTRIMAZOLE-BETAMETHASONE: 1-0.05 | 7 days supply | Qty: 15 | Fill #0

## 2019-01-16 NOTE — Patient Instructions (Signed)
For persisting rash on rt 5th digit, I wrote you lotrisone twice daily. DC triamcinolone. Refer to dermatologist for evaluation and treatment.  Recommend discuss medication for mood with your pcp. I think current meds are not excessive but your pcp might make incremental change.  Follow as regularly scheduled with pcp or as needed

## 2019-01-16 NOTE — Progress Notes (Signed)
Subjective:    Patient ID: [name], female    DOB: Apr 04, 1950, 68 y.o.   MRN: [id number]  HPI  Pt in for follow up. She states no pain to finger now. Pt was on antibiotic and she is on triamcinolone steroid. Rash still present but the steroid stopped itches.   Review of Systems  Constitutional: Negative for chills, fatigue and fever.  Respiratory: Negative for chest tightness, shortness of breath, wheezing and stridor.   Cardiovascular: Negative for chest pain and palpitations.  Skin:       Rt 5th digit rash. See hpi and exam.    Past Medical History:  Diagnosis Date  . Allergy    seasonal  . Arachnoiditis   . Depression   . Elevated liver enzymes   . Environmental allergies   . GERD (gastroesophageal reflux disease)   . H/O being hospitalized 03/2012   x1 week for nausea  . History of chicken pox   . Hypertension   . Migraine with typical aura    resolved years ago  . Nausea    chronic nausea      Social History   Socioeconomic History  . Marital status: Married    Spouse name: Not on file  . Number of children: Not on file  . Years of education: Not on file  . Highest education level: Not on file  Occupational History  . Not on file  Social Needs  . Financial resource strain: Not on file  . Food insecurity    Worry: Not on file    Inability: Not on file  . Transportation needs    Medical: Not on file    Non-medical: Not on file  Tobacco Use  . Smoking status: Former Smoker    Quit date: 08/09/1986    Years since quitting: 32.4  . Smokeless tobacco: Never Used  Substance and Sexual Activity  . Alcohol use: No  . Drug use: No  . Sexual activity: Not Currently    Partners: Male    Birth control/protection: Other-see comments    Comment: husband had vasectomy.   Lifestyle  . Physical activity    Days per week: Not on file    Minutes per session: Not on file  . Stress: Not on file  Relationships  . Social Herbalist on phone: Not on  file    Gets together: Not on file    Attends religious service: Not on file    Active member of club or organization: Not on file    Attends meetings of clubs or organizations: Not on file    Relationship status: Not on file  . Intimate partner violence    Fear of current or ex partner: Not on file    Emotionally abused: Not on file    Physically abused: Not on file    Forced sexual activity: Not on file  Other Topics Concern  . Not on file  Social History Narrative   2 step daughters- 18 and 51   She worked as an Web designer.   Enjoys puzzles, counted cross stitch.   Completed 1 year of college   4 cats    Past Surgical History:  Procedure Laterality Date  . ABDOMINOPLASTY  07/11/09  . EUS N/A 05/05/2012   Procedure: UPPER ENDOSCOPIC ULTRASOUND (EUS) LINEAR;  Surgeon: Beryle Beams, MD;  Location: [facility];  Service: Endoscopy;  Laterality: N/A;  . LAPAROSCOPIC CHOLECYSTECTOMY  2010  . NASAL SINUS SURGERY     "  Has had 2-3 surgeries over the years"    Family History  Problem Relation Age of Onset  . Alzheimer's disease Mother   . Hypertension Mother   . Migraines Mother   . Hypertension Father   . Other Father        ?carotid artery aneurysm  . Cancer Brother        mouth and throat    Allergies  Allergen Reactions  . Compazine [Prochlorperazine]     hyperactivity  . Morphine And Related Hives and Itching  . Trintellix [Vortioxetine] Nausea And Vomiting    Current Outpatient Medications on File Prior to Visit  Medication Sig Dispense Refill  . amLODipine (NORVASC) 5 MG tablet TAKE 1 TABLET BY MOUTH EVERY DAY.MAX ON INSURANCE 30 tablet 2  . amphetamine-dextroamphetamine (ADDERALL) 20 MG tablet Take 20 mg by mouth daily.    . clonazePAM (KLONOPIN) 1 MG tablet TAKE 1 TABLET BY MOUTH THREE TIMES A DAY AS NEEDED FOR ANXIETY 90 tablet 0  . doxycycline (VIBRA-TABS) 100 MG tablet Take 1 tablet (100 mg total) by mouth 2 (two) times daily. Can give caps  or generic. 14 tablet 0  . mirtazapine (REMERON) 45 MG tablet     . omeprazole (PRILOSEC) 40 MG capsule Take 1 capsule (40 mg total) by mouth daily. 90 capsule 3  . PARoxetine (PAXIL) 40 MG tablet TAKE 1 TABLET BY MOUTH  EVERY MORNING 90 tablet 1  . traZODone (DESYREL) 50 MG tablet     . triamcinolone (KENALOG) 0.025 % ointment Apply 1 application topically 2 (two) times daily. 30 g 0  . Zoster Vaccine Adjuvanted [facility]) injection Inject 0.5mg  IM now and again in 2-6 months. 0.5 mL 1   No current facility-administered medications on file prior to visit.     BP (!) 156/72   Pulse 91   Temp 97.8 F (36.6 C) (Temporal)   Resp 16   Ht 5\' 2"  (1.575 m)   Wt 166 lb 12.8 oz (75.7 kg)   LMP 12/28/2011   SpO2 98%   BMI 30.51 kg/m       Objective:   Physical Exam  General- No acute distress. Pleasant patient. Lungs- Clear, even and unlabored. Heart- regular rate and rhythm. Neurologic- CNII- XII grossly intact.  Skin- rt 5th digit. Beneath finger nail red, dry appearance with flaky ski but better than before. Faint pink  Area just at edge of nail. No warmth and not tender. No breakdown.      Assessment & Plan:  For persisting rash on rt 5th digit, I wrote you lotrisone twice daily. DC triamcinolone. Refer to dermatologist for evaluation and treatment.  Recommend discuss medication for mood with your pcp. I think current meds are not excessive but your pcp might make incremental change.  Follow as regularly scheduled with pcp or as needed

## 2019-01-22 DIAGNOSIS — H2512 Age-related nuclear cataract, left eye: Secondary | ICD-10-CM | POA: Diagnosis not present

## 2019-01-23 ENCOUNTER — Ambulatory Visit: Payer: 59 | Admitting: Medical

## 2019-02-02 ENCOUNTER — Telehealth: Payer: Self-pay

## 2019-02-02 NOTE — Telephone Encounter (Signed)
Patient called to report she is having hair loss.   Lvm for Patient to be aware she can have a virtual visit Monday to discuss this new problem with Melissa.

## 2019-02-09 ENCOUNTER — Ambulatory Visit: Payer: 59 | Admitting: Gastroenterology

## 2019-02-16 ENCOUNTER — Other Ambulatory Visit: Payer: Self-pay

## 2019-03-05 ENCOUNTER — Other Ambulatory Visit (HOSPITAL_BASED_OUTPATIENT_CLINIC_OR_DEPARTMENT_OTHER): Payer: Self-pay | Admitting: Family

## 2019-03-05 DIAGNOSIS — Z1231 Encounter for screening mammogram for malignant neoplasm of breast: Secondary | ICD-10-CM

## 2019-03-06 ENCOUNTER — Ambulatory Visit (INDEPENDENT_AMBULATORY_CARE_PROVIDER_SITE_OTHER): Payer: 59 | Admitting: Medical

## 2019-03-06 ENCOUNTER — Other Ambulatory Visit: Payer: Self-pay

## 2019-03-06 ENCOUNTER — Encounter: Payer: Self-pay | Admitting: Medical

## 2019-03-06 VITALS — BP 117/68 | HR 79

## 2019-03-06 DIAGNOSIS — H9203 Otalgia, bilateral: Secondary | ICD-10-CM | POA: Diagnosis not present

## 2019-03-06 DIAGNOSIS — R0981 Nasal congestion: Secondary | ICD-10-CM

## 2019-03-06 DIAGNOSIS — R05 Cough: Secondary | ICD-10-CM | POA: Diagnosis not present

## 2019-03-06 DIAGNOSIS — R059 Cough, unspecified: Secondary | ICD-10-CM

## 2019-03-06 DIAGNOSIS — J329 Chronic sinusitis, unspecified: Secondary | ICD-10-CM | POA: Diagnosis not present

## 2019-03-06 MED ORDER — AMOXICILLIN-POT CLAVULANATE 875-125 MG PO TABS: 1.0000 | ORAL_TABLET | Freq: Two times a day (BID) | ORAL | 0 refills | Status: DC

## 2019-03-06 MED ORDER — BENZONATATE 100 MG PO CAPS: 100.0000 mg | ORAL_CAPSULE | Freq: Three times a day (TID) | ORAL | 0 refills | Status: DC | PRN

## 2019-03-06 MED ORDER — FLUTICASONE PROPIONATE 50 MCG/ACT NA SUSP: 2.0000 | Freq: Every day | NASAL | 1 refills | Status: DC

## 2019-03-06 NOTE — Progress Notes (Signed)
   Subjective:    Patient ID: [name], female    DOB: 18-Dec-1950, 68 y.o.   MRN: [id number]  HPI Virtual Visit via Video Note  I connected with [name] on 03/06/19 at 11:00 AM EST by a video enabled telemedicine application and verified that I am speaking with the correct person using two identifiers.  Location: Patient: home Provider: office   I discussed the limitations of evaluation and management by telemedicine and the availability of in person appointments. The patient expressed understanding and agreed to proceed.  History of Present Illness:   Pt has one month of frontal sinus pressure and some maxillary. When blows nose will get some colored mucus. Also some ear pain intermittent as well as some st.   Pt has tried dayquil, nyquil and mucinex. Nothting is helping.  No fever, no chills, no sweats or loss of smell. No diffuse myaglias.  Pt basically staying at home presently. No close contact to covid + persons.  Pt states augmentin usually resolve her prior sinus infections. Occasional cough but no wheezing.   Observations/Objective: General-no acute distress, pleasant, oriented. Lungs- on inspection lungs appear unlabored. Neck- no tracheal deviation or jvd on inspection. Neuro- gross motor function appears intact. heent- self palpation notes frontal sinus pressure/pain.  Assessment and Plan:  You have symptoms that may represent sinus infection for one month as well as ear pain and cough. Will prescribe augmentin antibiotic, benzonatate and flonase.   We discussed whether signs/symptoms could represent covid. Pt doubts and I agree as well(note illness already one month and sinus infection like) but explained if symptoms persist, worsen or change best to go ahead and do testing. Asked to update [place] in about 5 days.   Follow up 7-10 days or as needed Follow Up Instructions:    I discussed the assessment and treatment plan with the patient. The patient was  provided an opportunity to ask questions and all were answered. The patient agreed with the plan and demonstrated an understanding of the instructions.   The patient was advised to call back or seek an in-person evaluation if the symptoms worsen or if the condition fails to improve as anticipated.  I provided 25 minutes of non-face-to-face time during this encounter.   Mackie Pai, PA-C    Review of Systems  Constitutional: Negative for chills, fatigue and fever.  HENT: Positive for congestion, sinus pressure and sinus pain. Negative for postnasal drip and sore throat.   Respiratory: Positive for cough. Negative for shortness of breath and wheezing.   Cardiovascular: Negative for chest pain and palpitations.  Gastrointestinal: Negative for abdominal pain.  Musculoskeletal: Negative for back pain and myalgias.  Skin: Negative for rash.  Neurological: Negative for dizziness, numbness and headaches.  Hematological: Negative for adenopathy. Does not bruise/bleed easily.  Psychiatric/Behavioral: Negative for behavioral problems, confusion, hallucinations, sleep disturbance and suicidal ideas.       Objective:   Physical Exam        Assessment & Plan:

## 2019-03-06 NOTE — Patient Instructions (Signed)
You have symptoms that may represent sinus infection for one month as well as ear pain and cough. Will prescribe augmentin antibiotic, benzonatate and flonase.   We discussed whether signs/symptoms could represent covid. Pt doubts and I agree as well(note illness already one month and sinus infection like) but explained if symptoms persist, worsen or change best to go ahead and do testing. Asked to update [place] in about 5 days.   Follow up 7-10 days or as needed

## 2019-03-08 ENCOUNTER — Other Ambulatory Visit: Payer: Self-pay | Admitting: Family Medicine

## 2019-03-08 ENCOUNTER — Other Ambulatory Visit: Payer: Self-pay | Admitting: Family

## 2019-03-08 DIAGNOSIS — K219 Gastro-esophageal reflux disease without esophagitis: Secondary | ICD-10-CM

## 2019-03-12 ENCOUNTER — Telehealth: Payer: Self-pay

## 2019-03-12 ENCOUNTER — Ambulatory Visit (HOSPITAL_BASED_OUTPATIENT_CLINIC_OR_DEPARTMENT_OTHER): Payer: 59

## 2019-03-12 NOTE — Telephone Encounter (Signed)
Copied from [place] [id number]. Topic: General - Other >> Mar 09, 2019  4:15 PM Leward Quan A wrote: Reason for CRM: Patient called to inform Mackie Pai that she is starting to feel somewhat better but has the headache and the sinus congestion but she is working on getting rid of them. Ph# [phone]

## 2019-03-12 NOTE — Telephone Encounter (Signed)
Sounds like slow improvement. Would ask her to schedule follow up for Friday if not better by Thursday afternoon. Could be virtual. Friday I think will be 10 days so wanted to offer her follow up if not better.

## 2019-03-13 ENCOUNTER — Encounter (HOSPITAL_BASED_OUTPATIENT_CLINIC_OR_DEPARTMENT_OTHER): Payer: Self-pay

## 2019-03-13 ENCOUNTER — Ambulatory Visit (HOSPITAL_BASED_OUTPATIENT_CLINIC_OR_DEPARTMENT_OTHER)
Admission: RE | Admit: 2019-03-13 | Discharge: 2019-03-13 | Disposition: A | Discharge status: Home / Self Care | Payer: 59 | Source: Ambulatory Visit | Attending: Family | Admitting: Family

## 2019-03-13 ENCOUNTER — Other Ambulatory Visit: Payer: Self-pay

## 2019-03-13 DIAGNOSIS — Z1231 Encounter for screening mammogram for malignant neoplasm of breast: Secondary | ICD-10-CM | POA: Diagnosis not present

## 2019-03-14 NOTE — Telephone Encounter (Signed)
Patient advised if not better by Thursday afternoon to call for virtual visit on friday

## 2019-05-23 DIAGNOSIS — Z23 Encounter for immunization: Secondary | ICD-10-CM | POA: Diagnosis not present

## 2019-05-23 DIAGNOSIS — L570 Actinic keratosis: Secondary | ICD-10-CM | POA: Diagnosis not present

## 2019-05-23 DIAGNOSIS — L814 Other melanin hyperpigmentation: Secondary | ICD-10-CM | POA: Diagnosis not present

## 2019-05-23 DIAGNOSIS — D225 Melanocytic nevi of trunk: Secondary | ICD-10-CM | POA: Diagnosis not present

## 2019-05-23 DIAGNOSIS — L578 Other skin changes due to chronic exposure to nonionizing radiation: Secondary | ICD-10-CM | POA: Diagnosis not present

## 2019-05-23 DIAGNOSIS — L820 Inflamed seborrheic keratosis: Secondary | ICD-10-CM | POA: Diagnosis not present

## 2019-05-23 DIAGNOSIS — L821 Other seborrheic keratosis: Secondary | ICD-10-CM | POA: Diagnosis not present

## 2019-05-23 DIAGNOSIS — L304 Erythema intertrigo: Secondary | ICD-10-CM | POA: Diagnosis not present

## 2019-05-30 ENCOUNTER — Telehealth: Payer: Self-pay

## 2019-05-30 DIAGNOSIS — J329 Chronic sinusitis, unspecified: Secondary | ICD-10-CM

## 2019-05-30 NOTE — Telephone Encounter (Signed)
Patient called to see if NP, O'sullivan could send in a referral  To a Ear Noise and throat Dr. Please follow up with the patient and Advise at [phone]   Thanks,

## 2019-05-30 NOTE — Telephone Encounter (Signed)
Patient advised of referral and to be aware of phone call for appointmen

## 2019-05-30 NOTE — Telephone Encounter (Signed)
Referral has been placed. 

## 2019-05-30 NOTE — Telephone Encounter (Signed)
Patient will like the referral due to recurrent sinus pain, infections, allergies etc  She will like to know if seeing a specialist will be the right thing to do for her.

## 2019-06-01 ENCOUNTER — Encounter: Payer: Self-pay | Admitting: Family

## 2019-06-01 ENCOUNTER — Other Ambulatory Visit: Payer: Self-pay

## 2019-06-01 ENCOUNTER — Telehealth: Payer: Self-pay | Admitting: Family

## 2019-06-01 ENCOUNTER — Ambulatory Visit (INDEPENDENT_AMBULATORY_CARE_PROVIDER_SITE_OTHER): Payer: 59 | Admitting: Family

## 2019-06-01 VITALS — BP 135/63 | HR 62 | Temp 97.6°F

## 2019-06-01 DIAGNOSIS — J40 Bronchitis, not specified as acute or chronic: Secondary | ICD-10-CM

## 2019-06-01 MED ORDER — AZITHROMYCIN 250 MG PO TABS: ORAL_TABLET | ORAL | 0 refills | Status: DC

## 2019-06-01 MED ORDER — PREDNISONE 10 MG PO TABS: ORAL_TABLET | ORAL | 0 refills | Status: DC

## 2019-06-01 MED ORDER — BENZONATATE 100 MG PO CAPS: 100.0000 mg | ORAL_CAPSULE | Freq: Three times a day (TID) | ORAL | 0 refills | Status: DC | PRN

## 2019-06-01 NOTE — Progress Notes (Signed)
Subjective:    Patient ID: [name], female    DOB: [birth year], 69 y.o.   MRN: [id number]  HPI   Virtual Visit via Telephone Note  I connected with [name] on 06/04/19 at 10:40 AM EST by telephone and verified that I am speaking with the correct person using two identifiers.  Location: Patient: home Provider: office   I discussed the limitations, risks, security and privacy concerns of performing an evaluation and management service by telephone and the availability of in person appointments. I also discussed with the patient that there may be a patient responsible charge related to this service. The patient expressed understanding and agreed to proceed.   History of Present Illness:   Patient is a 69 yr old female who presents today with chief complaint of cough. Reports associated + HA.  Head feels like "concrete", "couging terribly."  + associated chest congestion and wheezing. On/off temp with reported tmax 99.  Trying multiple otc treatments without improvement. Notes that this started 4 months ago.  + Post nasal drip.    She was treated for sinusitis back in December with augmentin and flonase. Noted brief improvement in her symptoms following this treatment.   Reports that she notes cough has worsened in the last 4-5 days.  Notes that benzonatate helps.    Review of Systems See HPI  Past Medical History:  Diagnosis Date  . Allergy    seasonal  . Arachnoiditis   . Depression   . Elevated liver enzymes   . Environmental allergies   . GERD (gastroesophageal reflux disease)   . H/O being hospitalized 03/2012   x1 week for nausea  . History of chicken pox   . Hypertension   . Migraine with typical aura    resolved years ago  . Nausea    chronic nausea      Social History   Socioeconomic History  . Marital status: Married    Spouse name: Not on file  . Number of children: Not on file  . Years of education: Not on file  . Highest education level: Not on  file  Occupational History  . Not on file  Tobacco Use  . Smoking status: Former Smoker    Quit date: 08/09/1986    Years since quitting: 32.8  . Smokeless tobacco: Never Used  Substance and Sexual Activity  . Alcohol use: No  . Drug use: No  . Sexual activity: Not Currently    Partners: Male    Birth control/protection: Other-see comments    Comment: husband had vasectomy.   Other Topics Concern  . Not on file  Social History Narrative   2 step daughters- 3 and 38   She worked as an Web designer.   Enjoys puzzles, counted cross stitch.   Completed 1 year of college   4 cats   Social Determinants of Health   Financial Resource Strain:   . Difficulty of Paying Living Expenses: Not on file  Food Insecurity:   . Worried About Charity fundraiser in the Last Year: Not on file  . Ran Out of Food in the Last Year: Not on file  Transportation Needs:   . Lack of Transportation (Medical): Not on file  . Lack of Transportation (Non-Medical): Not on file  Physical Activity:   . Days of Exercise per Week: Not on file  . Minutes of Exercise per Session: Not on file  Stress:   . Feeling of Stress : Not on  file  Social Connections:   . Frequency of Communication with Friends and Family: Not on file  . Frequency of Social Gatherings with Friends and Family: Not on file  . Attends Religious Services: Not on file  . Active Member of Clubs or Organizations: Not on file  . Attends Archivist Meetings: Not on file  . Marital Status: Not on file  Intimate Partner Violence:   . Fear of Current or Ex-Partner: Not on file  . Emotionally Abused: Not on file  . Physically Abused: Not on file  . Sexually Abused: Not on file    Past Surgical History:  Procedure Laterality Date  . ABDOMINOPLASTY  07/11/09  . EUS N/A 05/05/2012   Procedure: UPPER ENDOSCOPIC ULTRASOUND (EUS) LINEAR;  Surgeon: Beryle Beams, MD;  Location: [facility];  Service: Endoscopy;  Laterality:  N/A;  . LAPAROSCOPIC CHOLECYSTECTOMY  2010  . NASAL SINUS SURGERY     "Has had 2-3 surgeries over the years"    Family History  Problem Relation Age of Onset  . Alzheimer's disease Mother   . Hypertension Mother   . Migraines Mother   . Hypertension Father   . Other Father        ?carotid artery aneurysm  . Cancer Brother        mouth and throat    Allergies  Allergen Reactions  . Compazine [Prochlorperazine]     hyperactivity  . Morphine And Related Hives and Itching  . Trintellix [Vortioxetine] Nausea And Vomiting    Current Outpatient Medications on File Prior to Visit  Medication Sig Dispense Refill  . amLODipine (NORVASC) 5 MG tablet TAKE 1 TABLET BY MOUTH EVERY DAY.MAX ON INSURANCE 30 tablet 2  . amphetamine-dextroamphetamine (ADDERALL) 20 MG tablet Take 20 mg by mouth daily.    . benzonatate (TESSALON) 100 MG capsule Take 1 capsule (100 mg total) by mouth 3 (three) times daily as needed for cough. 30 capsule 0  . clonazePAM (KLONOPIN) 1 MG tablet TAKE 1 TABLET BY MOUTH THREE TIMES A DAY AS NEEDED FOR ANXIETY 90 tablet 0  . clotrimazole-betamethasone (LOTRISONE) cream Apply 1 application topically 2 (two) times daily. 30 g 0  . fluticasone (FLONASE) 50 MCG/ACT nasal spray Place 2 sprays into both nostrils daily. 16 g 1  . mirtazapine (REMERON) 45 MG tablet     . omeprazole (PRILOSEC) 40 MG capsule TAKE 1 CAPSULE BY MOUTH EVERY DAY 30 capsule 11  . PARoxetine (PAXIL) 40 MG tablet TAKE 1 TABLET BY MOUTH  EVERY MORNING 90 tablet 1  . traZODone (DESYREL) 50 MG tablet      No current facility-administered medications on file prior to visit.    BP 135/63   Pulse 62   Temp 97.6 F (36.4 C)   LMP 12/28/2011       Objective:   Physical Exam   Gen: Awake, alert Resp: Breathing sounds even and non-labored Psych: calm/pleasant demeanor Neuro: Alert and Oriented x 3, speech is clear.    Assessment & Plan:    Bronchitis- Will rx with zpak, tessalon prednisone  taper. If symptoms worsen or fail to improves she is advised to let us know and we will arrange an in office evaluation.       Follow Up Instructions:    I discussed the assessment and treatment plan with the patient. The patient was provided an opportunity to ask questions and all were answered. The patient agreed with the plan and demonstrated an understanding of the  instructions.   The patient was advised to call back or seek an in-person evaluation if the symptoms worsen or if the condition fails to improve as anticipated.  I provided 15 minutes of non-face-to-face time during this encounter.   Nance Pear, NP

## 2019-06-01 NOTE — Telephone Encounter (Signed)
I have tried pt's cell number several times to complete her telephone visit over the last 20 minutes as well as home number without answer- no answering machine on either phone.

## 2019-06-08 ENCOUNTER — Other Ambulatory Visit: Payer: Self-pay

## 2019-06-08 ENCOUNTER — Encounter: Payer: Self-pay | Admitting: Family

## 2019-06-08 ENCOUNTER — Ambulatory Visit (INDEPENDENT_AMBULATORY_CARE_PROVIDER_SITE_OTHER): Payer: 59 | Admitting: Family

## 2019-06-08 VITALS — BP 115/66 | HR 70 | Temp 97.1°F

## 2019-06-08 DIAGNOSIS — J40 Bronchitis, not specified as acute or chronic: Secondary | ICD-10-CM | POA: Diagnosis not present

## 2019-06-08 DIAGNOSIS — J309 Allergic rhinitis, unspecified: Secondary | ICD-10-CM | POA: Diagnosis not present

## 2019-06-08 MED ORDER — CETIRIZINE HCL 10 MG PO TABS: 10.0000 mg | ORAL_TABLET | Freq: Every day | ORAL | 11 refills | Status: DC

## 2019-06-08 MED ORDER — FLUTICASONE PROPIONATE 50 MCG/ACT NA SUSP: 2.0000 | Freq: Every day | NASAL | 5 refills | Status: DC

## 2019-06-08 MED FILL — FLUTICASONE PROP 50 MCG SPR: 50 | 30 days supply | Qty: 16 | Fill #0

## 2019-06-08 NOTE — Progress Notes (Addendum)
Virtual Visit via Telephone Note  I connected with Ken Otoole on 06/08/19 at 10:00 AM EST by telephone and verified that I am speaking with the correct person using two identifiers.  Location: Patient: home Provider: work   I discussed the limitations, risks, security and privacy concerns of performing an evaluation and management service by telephone and the availability of in person appointments. I also discussed with the patient that there may be a patient responsible charge related to this service. The patient expressed understanding and agreed to proceed.   History of Present Illness:  Patient is a 69 yr old female who presents today with chief complaint of severe nasal congestion. Also reports + facial pain. She reports that nasal congestion has been present "all along" but has worsened.  Temp today is 97.1.  Reports that she can't get anything out of her nose when she tries to blow her nose. Has a lot of post nasal drip. Is taking flonase but is not taking an antihistamine.   Bronchitis- was seen on 06/01/19 and was treated with zpak and prednisone which she has completed. Reports that her cough is improved.     Observations/Objective:  Not examined- voice sounds hoarse on phone though.   Assessment and Plan:  Bronchitis- resolved.   Allergic rhinitis- uncontrolled. Advised pt to continue flonase and add zyrtec once daily. Call if symptoms worsen or if symptoms are not improved in 1 week.   Follow Up Instructions:    I discussed the assessment and treatment plan with the patient. The patient was provided an opportunity to ask questions and all were answered. The patient agreed with the plan and demonstrated an understanding of the instructions.   The patient was advised to call back or seek an in-person evaluation if the symptoms worsen or if the condition fails to improve as anticipated.  I provided 15 minutes of non-face-to-face time during this encounter.   Nance Pear, NP

## 2019-06-11 ENCOUNTER — Other Ambulatory Visit: Payer: Self-pay | Admitting: Family

## 2019-06-15 ENCOUNTER — Telehealth: Payer: Self-pay | Admitting: Family

## 2019-06-15 MED ORDER — AMOXICILLIN-POT CLAVULANATE 875-125 MG PO TABS: 1.0000 | ORAL_TABLET | Freq: Two times a day (BID) | ORAL | 0 refills | Status: DC

## 2019-06-15 NOTE — Telephone Encounter (Signed)
Called pt- went straight to voicemail. Left message requesting that she check her mychart for detailed message.

## 2019-06-15 NOTE — Telephone Encounter (Signed)
Pt states that she recently saw Inda Castle.Marland KitchenMarland KitchenShe's been on meds for Allergies and over the counter meds... She is still very sick and needs recommendations on what else she can take or do to get better. Please Advise

## 2019-06-26 ENCOUNTER — Other Ambulatory Visit: Payer: Self-pay | Admitting: Family

## 2019-06-27 ENCOUNTER — Encounter: Payer: Self-pay | Admitting: Internal Medicine

## 2019-06-27 ENCOUNTER — Ambulatory Visit (INDEPENDENT_AMBULATORY_CARE_PROVIDER_SITE_OTHER): Payer: 59 | Admitting: Internal Medicine

## 2019-06-27 ENCOUNTER — Ambulatory Visit (HOSPITAL_BASED_OUTPATIENT_CLINIC_OR_DEPARTMENT_OTHER)
Admission: RE | Admit: 2019-06-27 | Discharge: 2019-06-27 | Disposition: A | Discharge status: Home / Self Care | Payer: 59 | Source: Ambulatory Visit | Attending: Internal Medicine | Admitting: Internal Medicine

## 2019-06-27 ENCOUNTER — Other Ambulatory Visit: Payer: Self-pay

## 2019-06-27 ENCOUNTER — Encounter: Payer: Self-pay | Admitting: Family

## 2019-06-27 VITALS — BP 121/84 | HR 82 | Ht 62.0 in | Wt 160.0 lb

## 2019-06-27 DIAGNOSIS — J019 Acute sinusitis, unspecified: Secondary | ICD-10-CM | POA: Diagnosis not present

## 2019-06-27 DIAGNOSIS — J9801 Acute bronchospasm: Secondary | ICD-10-CM

## 2019-06-27 DIAGNOSIS — J40 Bronchitis, not specified as acute or chronic: Secondary | ICD-10-CM

## 2019-06-27 MED ORDER — ALBUTEROL SULFATE HFA 108 (90 BASE) MCG/ACT IN AERS: 2.0000 | INHALATION_SPRAY | Freq: Four times a day (QID) | RESPIRATORY_TRACT | 0 refills | Status: DC | PRN

## 2019-06-27 MED ORDER — PREDNISONE 10 MG PO TABS: ORAL_TABLET | ORAL | 0 refills | Status: DC

## 2019-06-27 MED FILL — predniSONE 10 MG TABS: 10 | 8 days supply | Qty: 20 | Fill #0

## 2019-06-27 MED FILL — VENTOLIN HFA 90 MCG INHALER: 108 (90 BAS | 25 days supply | Qty: 18 | Fill #0

## 2019-06-27 NOTE — Progress Notes (Signed)
Pre visit review using our clinic review tool, if applicable. No additional management support is needed unless otherwise documented below in the visit note. 

## 2019-06-27 NOTE — Telephone Encounter (Signed)
Appt scheduled w/ Dr. Paz  

## 2019-06-27 NOTE — Progress Notes (Signed)
Subjective:    Patient ID: [name], female    DOB: May 12, 1950, 69 y.o.   MRN: [id number]  DOS:  06/27/2019 Type of visit - description: Virtual Visit via Telephone  Attempted  to make this a video visit, due to technical difficulties from the patient side it was not possible  thus we proceeded with a Virtual Visit via Telephone    I connected with above mentioned patient  by telephone and verified that I am speaking with the correct person using two identifiers.  THIS ENCOUNTER IS A VIRTUAL VISIT DUE TO COVID-19 - PATIENT WAS NOT SEEN IN THE OFFICE. PATIENT HAS CONSENTED TO VIRTUAL VISIT / TELEMEDICINE VISIT   Location of patient: home  Location of provider: office  I discussed the limitations, risks, security and privacy concerns of performing an evaluation and management service by telephone and the availability of in person appointments. I also discussed with the patient that there may be a patient responsible charge related to this service. The patient expressed understanding and agreed to proceed.  Acute Before I talk with the patient, I did a extensive chart review: Virtual visit 06/01/2019, DX bronchitis, Rx Z-Pak, Tessalon, prednisone. Seen virtually again 06/08/2019, DX uncontrolled allergic rhinitis, continue Flonase, add Zyrtec Phone call 06/15/2019 was not feeling better, was rx Augmentin  Patient reports today that since her respiratory symptoms started, she never got close to 100% better. Currently complaining of: Chest congestion, sinus feeling full, cough, wheezing. Cough is intense at the time and associated with nausea and vomiting. Currently taking Delsym and Mucinex DM.  Review of Systems Has checked her temperature, no fever.  No diarrhea Has no new myalgias or aches. When asked, she admits to wheezing.   Past Medical History:  Diagnosis Date  . Allergy    seasonal  . Arachnoiditis   . Depression   . Elevated liver enzymes   . Environmental allergies     . GERD (gastroesophageal reflux disease)   . H/O being hospitalized 03/2012   x1 week for nausea  . History of chicken pox   . Hypertension   . Migraine with typical aura    resolved years ago  . Nausea    chronic nausea     Past Surgical History:  Procedure Laterality Date  . ABDOMINOPLASTY  07/11/09  . EUS N/A 05/05/2012   Procedure: UPPER ENDOSCOPIC ULTRASOUND (EUS) LINEAR;  Surgeon: Beryle Beams, MD;  Location: [facility];  Service: Endoscopy;  Laterality: N/A;  . LAPAROSCOPIC CHOLECYSTECTOMY  2010  . NASAL SINUS SURGERY     "Has had 2-3 surgeries over the years"    Allergies as of 06/27/2019      Reactions   Compazine [prochlorperazine]    hyperactivity   Morphine And Related Hives, Itching   Trintellix [vortioxetine] Nausea And Vomiting      Medication List       Accurate as of June 27, 2019  2:49 PM. If you have any questions, ask your nurse or doctor.        STOP taking these medications   amoxicillin-clavulanate 875-125 MG tablet Commonly known as: AUGMENTIN Stopped by: Kathlene November, MD     TAKE these medications   AIRBORNE PO Take by mouth.   albuterol 108 (90 Base) MCG/ACT inhaler Commonly known as: VENTOLIN HFA Inhale 2 puffs into the lungs every 6 (six) hours as needed for wheezing or shortness of breath. Started by: Kathlene November, MD   amLODipine 5 MG tablet Commonly known as:  NORVASC TAKE 1 TABLET BY MOUTH EVERY DAY.MAX ON INSURANCE   amphetamine-dextroamphetamine 20 MG tablet Commonly known as: ADDERALL Take 20 mg by mouth daily.   benzonatate 100 MG capsule Commonly known as: TESSALON TAKE 1 CAPSULE BY MOUTH THREE TIMES A DAY AS NEEDED FOR COUGH   cetirizine 10 MG tablet Commonly known as: ZYRTEC Take 1 tablet (10 mg total) by mouth daily.   clonazePAM 1 MG tablet Commonly known as: KLONOPIN TAKE 1 TABLET BY MOUTH THREE TIMES A DAY AS NEEDED FOR ANXIETY   clotrimazole-betamethasone cream Commonly known as: Lotrisone Apply 1  application topically 2 (two) times daily.   fluticasone 50 MCG/ACT nasal spray Commonly known as: FLONASE Place 2 sprays into both nostrils daily.   FOLIC ACID PO Take by mouth.   MAGNESIUM PO Take by mouth.   mirtazapine 45 MG tablet Commonly known as: REMERON   multivitamin capsule Take 1 capsule by mouth daily.   omeprazole 40 MG capsule Commonly known as: PRILOSEC TAKE 1 CAPSULE BY MOUTH EVERY DAY   PARoxetine 40 MG tablet Commonly known as: PAXIL TAKE 1 TABLET BY MOUTH  EVERY MORNING   predniSONE 10 MG tablet Commonly known as: DELTASONE 4 tablets x 2 days, 3 tabs x 2 days, 2 tabs x 2 days, 1 tab x 2 days Started by: Kathlene November, MD   Prevagen Extra Strength 20 MG Caps Generic drug: Apoaequorin Take by mouth.   PROBIOTIC DAILY PO Take by mouth.   traZODone 50 MG tablet Commonly known as: DESYREL          Objective:   Physical Exam BP 121/84   Pulse 82   Ht 5\' 2"  (1.575 m)   Wt 160 lb (72.6 kg)   LMP 12/28/2011   BMI 29.26 kg/m  This is a virtual telephone visit, she is alert oriented x3, frequent cough noted with large airway congestion.  Speaking in complete sentences, does not sound in distress.    Assessment    69 year old female, PMH includesHTN, reactive airway disease, anxiety depression, presents with  Bronchitis, sinusitis: Persistent symptoms, see HPI. Reports wheezing and per chart review she has a history of reactive airway disease. She already had 2 antibiotics. She completed her Covid vaccinations 05/24/2019. Plan: Continue Delsym, Mucinex D (BP is good despite taking decongestant) , Tessalon. Good hydration Start albuterol, how to use it explained to the patient because she has not used it before. Second round of prednisone Chest x-ray ER if symptoms severe during the weekend, in person visit next week if not improving. Message sent to the patient with instructions. Addendum: Chest x-ray negative, patient notified.      I  discussed the assessment and treatment plan with the patient. The patient was provided an opportunity to ask questions and all were answered. The patient agreed with the plan and demonstrated an understanding of the instructions.   The patient was advised to call back or seek an in-person evaluation if the symptoms worsen or if the condition fails to improve as anticipated.  I provided 23 minutes of non-face-to-face time during this encounter.  Kathlene November, MD

## 2019-07-10 ENCOUNTER — Emergency Department (HOSPITAL_BASED_OUTPATIENT_CLINIC_OR_DEPARTMENT_OTHER)
Admission: EM | Admit: 2019-07-10 | Discharge: 2019-07-10 | Disposition: A | Discharge status: Home / Self Care | Payer: 59 | Attending: Emergency Medicine | Admitting: Emergency Medicine

## 2019-07-10 ENCOUNTER — Other Ambulatory Visit: Payer: Self-pay

## 2019-07-10 ENCOUNTER — Emergency Department (HOSPITAL_BASED_OUTPATIENT_CLINIC_OR_DEPARTMENT_OTHER): Payer: 59

## 2019-07-10 ENCOUNTER — Encounter (HOSPITAL_BASED_OUTPATIENT_CLINIC_OR_DEPARTMENT_OTHER): Payer: Self-pay | Admitting: *Deleted

## 2019-07-10 DIAGNOSIS — Z888 Allergy status to other drugs, medicaments and biological substances status: Secondary | ICD-10-CM | POA: Diagnosis not present

## 2019-07-10 DIAGNOSIS — I10 Essential (primary) hypertension: Secondary | ICD-10-CM | POA: Insufficient documentation

## 2019-07-10 DIAGNOSIS — Z79899 Other long term (current) drug therapy: Secondary | ICD-10-CM | POA: Insufficient documentation

## 2019-07-10 DIAGNOSIS — R0602 Shortness of breath: Secondary | ICD-10-CM | POA: Diagnosis not present

## 2019-07-10 DIAGNOSIS — Z87891 Personal history of nicotine dependence: Secondary | ICD-10-CM | POA: Diagnosis not present

## 2019-07-10 DIAGNOSIS — E781 Pure hyperglyceridemia: Secondary | ICD-10-CM | POA: Diagnosis not present

## 2019-07-10 DIAGNOSIS — Z885 Allergy status to narcotic agent status: Secondary | ICD-10-CM | POA: Diagnosis not present

## 2019-07-10 DIAGNOSIS — R05 Cough: Secondary | ICD-10-CM | POA: Insufficient documentation

## 2019-07-10 DIAGNOSIS — R053 Chronic cough: Secondary | ICD-10-CM

## 2019-07-10 MED ORDER — HYDROCODONE-HOMATROPINE 5-1.5 MG/5ML PO SYRP: 5.0000 mL | ORAL_SOLUTION | Freq: Four times a day (QID) | ORAL | 0 refills | Status: DC | PRN

## 2019-07-10 MED ORDER — GUAIFENESIN-CODEINE 100-10 MG/5ML PO SOLN
5.0000 mL | Freq: Once | ORAL | Status: AC
  Administered 2019-07-10: 5 mL via ORAL
  Filled 2019-07-10: qty 5

## 2019-07-10 MED ORDER — HYDROCODONE-HOMATROPINE 5-1.5 MG/5ML PO SYRP
5.0000 mL | ORAL_SOLUTION | Freq: Once | ORAL | Status: DC
  Filled 2019-07-10: qty 5

## 2019-07-10 NOTE — ED Provider Notes (Addendum)
[facility] EMERGENCY DEPARTMENT Provider Note   CSN: [id number] Arrival date & time: 07/10/19  1813     History Chief Complaint  Patient presents with  . Cough    [name] is a 69 y.o. female with PMH significant for HTN and recently diagnosed bronchitis and sinusitis who presents the ED with a 49-month history of cough.  Patient has been evaluated by her primary care provider, Dr. Larose Kells, on numerous occasions most recently 06/27/2019.  She had been previously treated with Augmentin and was asked to continue with Delsym, Mucinex, and Tessalon Perles.  She is also been provided with albuterol as well as a course of prednisone.  Plain films obtained were negative.  She had completed her COVID-19 vaccination earlier this year.  She admits to seasonal allergies for which she takes antihistamines, Flonase, and will perform nasal rinses.  She continues to endorse feelings of congestion between her chest and throat and is frustrated that her cough continues to be nonproductive.  Her chronic cough is now contributed to generalized chest wall discomfort when coughing.  She denies any fevers or chills, history of clots, history of clotting disorder, unilateral extremity swelling, exertional chest pain, difficulty breathing, abdominal pain, nausea or vomiting, or other symptoms.  She has a remote history of smoking tobacco, but less than 10-pack-year smoking history.  She is accompanied by her husband.    HPI     Past Medical History:  Diagnosis Date  . Allergy    seasonal  . Arachnoiditis   . Depression   . Elevated liver enzymes   . Environmental allergies   . GERD (gastroesophageal reflux disease)   . H/O being hospitalized 03/2012   x1 week for nausea  . History of chicken pox   . Hypertension   . Migraine with typical aura    resolved years ago  . Nausea    chronic nausea     Patient Active Problem List   Diagnosis Date Noted  . Hypertension   . Hypertriglyceridemia  06/15/2015  . Allergic rhinitis 10/16/2014  . GERD (gastroesophageal reflux disease) 10/16/2014  . Reactive airway disease 09/11/2014  . Chronic nausea 12/25/2013  . Anxiety and depression 12/25/2013  . Arachnoiditis   . Umbilical hernia 0000000    Past Surgical History:  Procedure Laterality Date  . ABDOMINOPLASTY  07/11/09  . EUS N/A 05/05/2012   Procedure: UPPER ENDOSCOPIC ULTRASOUND (EUS) LINEAR;  Surgeon: Beryle Beams, MD;  Location: [facility];  Service: Endoscopy;  Laterality: N/A;  . LAPAROSCOPIC CHOLECYSTECTOMY  2010  . NASAL SINUS SURGERY     "Has had 2-3 surgeries over the years"     OB History    Gravida  1   Para  0   Term      Preterm      AB  1   Living        SAB      TAB  1   Ectopic      Multiple      Live Births              Family History  Problem Relation Age of Onset  . Alzheimer's disease Mother   . Hypertension Mother   . Migraines Mother   . Hypertension Father   . Other Father        ?carotid artery aneurysm  . Cancer Brother        mouth and throat    Social History   Tobacco  Use  . Smoking status: Former Smoker    Quit date: 08/09/1986    Years since quitting: 32.9  . Smokeless tobacco: Never Used  Substance Use Topics  . Alcohol use: No  . Drug use: No    Home Medications Prior to Admission medications   Medication Sig Start Date End Date Taking? Authorizing Provider  albuterol (VENTOLIN HFA) 108 (90 Base) MCG/ACT inhaler Inhale 2 puffs into the lungs every 6 (six) hours as needed for wheezing or shortness of breath. 06/27/19   Colon Branch, MD  amLODipine (NORVASC) 5 MG tablet TAKE 1 TABLET BY MOUTH EVERY DAY.MAX ON INSURANCE 06/12/19   [name], NP  amphetamine-dextroamphetamine (ADDERALL) 20 MG tablet Take 20 mg by mouth daily.    [provider]  Apoaequorin (PREVAGEN EXTRA STRENGTH) 20 MG CAPS Take by mouth.    [provider]  benzonatate (TESSALON) 100 MG capsule TAKE 1  CAPSULE BY MOUTH THREE TIMES A DAY AS NEEDED FOR COUGH Patient not taking: Reported on 06/27/2019 06/27/19   [name], NP  cetirizine (ZYRTEC) 10 MG tablet Take 1 tablet (10 mg total) by mouth daily. 06/08/19   [name], NP  clonazePAM (KLONOPIN) 1 MG tablet TAKE 1 TABLET BY MOUTH THREE TIMES A DAY AS NEEDED FOR ANXIETY 10/20/16   [name], NP  clotrimazole-betamethasone (LOTRISONE) cream Apply 1 application topically 2 (two) times daily. 01/16/19   Saguier, Percell Miller, PA-C  fluticasone (FLONASE) 50 MCG/ACT nasal spray Place 2 sprays into both nostrils daily. 06/08/19   [name], NP  FOLIC ACID PO Take by mouth.    [provider]  HYDROcodone-homatropine (HYCODAN) 5-1.5 MG/5ML syrup Take 5 mLs by mouth every 6 (six) hours as needed for up to 14 days for cough. 07/10/19 07/24/19  Corena Herter, PA-C  MAGNESIUM PO Take by mouth.    [provider]  mirtazapine (REMERON) 45 MG tablet     [provider]  Multiple Vitamin (MULTIVITAMIN) capsule Take 1 capsule by mouth daily.    [provider]  Multiple Vitamins-Minerals (AIRBORNE PO) Take by mouth.    [provider]  omeprazole (PRILOSEC) 40 MG capsule TAKE 1 CAPSULE BY MOUTH EVERY DAY 03/09/19   [name], NP  PARoxetine (PAXIL) 40 MG tablet TAKE 1 TABLET BY MOUTH  EVERY MORNING 06/09/18   [name], NP  predniSONE (DELTASONE) 10 MG tablet 4 tablets x 2 days, 3 tabs x 2 days, 2 tabs x 2 days, 1 tab x 2 days 06/27/19   Colon Branch, MD  Probiotic Product (PROBIOTIC DAILY PO) Take by mouth.    [provider]  traZODone (DESYREL) 50 MG tablet     [provider]    Allergies    Compazine [prochlorperazine], Morphine and related, and Trintellix [vortioxetine]  Review of Systems   Review of Systems  All other systems reviewed and are negative.   Physical Exam Updated Vital Signs BP (!) 145/81 (BP Location: Right Arm)   Pulse  74   Temp 98.5 F (36.9 C) (Oral)   Resp 16   Ht 5\' 2"  (1.575 m)   Wt 72 kg   LMP 12/28/2011   SpO2 100%   BMI 29.03 kg/m   Physical Exam Vitals and nursing note reviewed. Exam conducted with a chaperone present.  Constitutional:      General: She is not in acute distress.    Appearance: Normal appearance.  HENT:     Head: Normocephalic and atraumatic.  Eyes:     General: No scleral icterus.    Conjunctiva/sclera: Conjunctivae normal.  Cardiovascular:     Rate and Rhythm: Normal rate and regular rhythm.     Pulses: Normal pulses.     Heart sounds: Normal heart sounds.  Pulmonary:     Comments: No increased respiratory effort.  No accessory muscle use.  Breath sounds intact bilaterally.  Wheezy cough, however no wheezing independent of cough.  No rales appreciated.  Reproducible chest wall tenderness to palpation.  No respiratory distress. Skin:    General: Skin is dry.     Capillary Refill: Capillary refill takes less than 2 seconds.  Neurological:     Mental Status: She is alert and oriented to person, place, and time.     GCS: GCS eye subscore is 4. GCS verbal subscore is 5. GCS motor subscore is 6.  Psychiatric:        Mood and Affect: Mood normal.        Behavior: Behavior normal.        Thought Content: Thought content normal.     ED Results / Procedures / Treatments   Labs (all labs ordered are listed, but only abnormal results are displayed) Labs Reviewed - No data to display  EKG None  Radiology DG Chest 2 View  Result Date: 07/10/2019 CLINICAL DATA:  Cough and shortness of breath EXAM: CHEST - 2 VIEW COMPARISON:  06/27/2019 FINDINGS: Minimal scarring is noted in the left base. Cardiac shadow is within normal limits. The lungs are well aerated bilaterally without focal infiltrate or effusion. Mild degenerative changes of the thoracic spine are noted. IMPRESSION: Minimal left basilar scarring.  No acute abnormality noted. Electronically Signed   By: Inez Catalina M.D.   On: 07/10/2019 18:56    Procedures Procedures (including critical care time)  Medications Ordered in ED Medications  HYDROcodone-homatropine (HYCODAN) 5-1.5 MG/5ML syrup 5 mL (has no administration in time range)    ED Course  I have reviewed the triage vital signs and the nursing notes.  Pertinent labs & imaging results that were available during my care of the patient were reviewed by me and considered in my medical decision making (see chart for details).    MDM Rules/Calculators/A&P                      Patient is already taking numerous medications in effort to quell her symptoms.  She states that the prednisone taper that she received did not help her symptoms, nor did the albuterol or the antibiotics.  She continues to endorse chest congestion and cough.  She has a remote smoking history, but nothing in past 30 years.  Personally reviewed chest x-ray obtained today which again demonstrates no effusion, opacities, or other acute cardiopulmonary findings.  No leg swelling.  No history of clots.  She does endorse continued GERD symptoms, but is taking her proton pump inhibitors as prescribed.  Given her wheezy cough and symptoms of congestion, lower suspicion for GERD induced chronic cough.  Will prescribe Hycodan syrup here in the ED and refer patient to pulmonology for ongoing evaluation and management.  While there is an allergy listed to morphine and related, she states she has taken hydrocodone in the past and it is well-tolerated.  I discussed case with Dr. Reubin Milan who personally evaluated patient and agrees with assessment and plan.  Strict ED return precautions discussed.  All of the evaluation and work-up results were discussed with the  patient and any family at bedside. They were provided opportunity to ask any additional questions and have none at this time. They have expressed understanding of verbal discharge instructions as well as return precautions and are  agreeable to the plan.   Final Clinical Impression(s) / ED Diagnoses Final diagnoses:  Chronic cough    Rx / DC Orders ED Discharge Orders         Ordered    HYDROcodone-homatropine (HYCODAN) 5-1.5 MG/5ML syrup  Every 6 hours PRN     07/10/19 2134           Corena Herter, PA-C 07/10/19 2134    Corena Herter, PA-C 07/10/19 2134    Lucrezia Starch, MD 07/11/19 1506

## 2019-07-10 NOTE — ED Notes (Signed)
Pt c/o cough for 3 months  Pt has been seen by her PCP and has been given medications   Pt states she had a chest x ray a couple of weeks ago  Pt states she continues to cough, nonproductive

## 2019-07-10 NOTE — Discharge Instructions (Addendum)
Please call [facility] to schedule appointment for ongoing evaluation and management of your chronic cough.  Please take the Hycodan, as prescribed.  Please also continue with any other prescribed medications.  Notify your primary care provider of today's encounter.  Return to the ED or seek immediate medical attention for any new or worsening symptoms.  You were given narcotic and or sedative medications while in the emergency department. Do not drive. Do not use machinery or power tools. Do not sign legal documents. Do not drink alcohol. Do not take sleeping pills. Do not supervise children by yourself. Do not participate in activities that require climbing or being in high places.

## 2019-07-10 NOTE — ED Triage Notes (Signed)
Pt c/o cough x 3 months . Seen by PMD multiple times for same

## 2019-07-13 ENCOUNTER — Other Ambulatory Visit: Payer: Self-pay | Admitting: Family

## 2019-07-16 ENCOUNTER — Telehealth: Payer: Self-pay

## 2019-07-16 DIAGNOSIS — R053 Chronic cough: Secondary | ICD-10-CM

## 2019-07-16 DIAGNOSIS — R05 Cough: Secondary | ICD-10-CM

## 2019-07-16 NOTE — Telephone Encounter (Signed)
Patient called in to see if NP Inda Castle could send in a prescription for  HYDROcodone-homatropine [facility]) 5-1.5 MG/5ML syrup [id number]    [id number] [place] [place] [address]  [place], [place] [address]  Phone:  [phone] Fax:  [phone]  DEA #:  [id number]

## 2019-07-17 MED ORDER — BENZONATATE 100 MG PO CAPS: ORAL_CAPSULE | ORAL | 0 refills | Status: DC

## 2019-07-17 NOTE — Telephone Encounter (Signed)
Please advise pt that I cannot refill the hycodan as it is a controlled medication and is only allowed to be prescribed for 5 days. I [facility] and have also placed a referral to pulmonology.

## 2019-07-18 NOTE — Telephone Encounter (Signed)
Patient advised of medication sent to pharmacy and referral started yesterday. Phone number to [facility] given to patient.

## 2019-07-19 ENCOUNTER — Telehealth: Payer: Self-pay | Admitting: Pulmonary Disease

## 2019-07-19 NOTE — Telephone Encounter (Signed)
Pt called back-- please return call.  

## 2019-07-19 NOTE — Telephone Encounter (Signed)
Left message for patient to call back  

## 2019-07-19 NOTE — Telephone Encounter (Signed)
Ok to keep appointment. Unlikely to be COVID if she has these symptoms for 3 months

## 2019-07-19 NOTE — Telephone Encounter (Signed)
LMTCB x 1 

## 2019-07-19 NOTE — Telephone Encounter (Signed)
Spoke with patient. She is aware that she can keep her appt for tomorrow.   Nothing further needed at time of call.

## 2019-07-19 NOTE — Telephone Encounter (Signed)
Spoke with pt, she states she has been sick for 3 months, cough, SOB, ST, and headache. She doesn't know if she should come to her appt tomorrow. She has had both of her covid vaccines. She is coming for a Consult with Dr. [name]. She was in the ED on 07/10/2019 but doesn't have a recent covid test in her chart. Please advise.   Husband call phone [phone]

## 2019-07-20 ENCOUNTER — Ambulatory Visit (INDEPENDENT_AMBULATORY_CARE_PROVIDER_SITE_OTHER): Payer: 59 | Admitting: Pulmonary Disease

## 2019-07-20 ENCOUNTER — Encounter: Payer: Self-pay | Admitting: Pulmonary Disease

## 2019-07-20 ENCOUNTER — Other Ambulatory Visit: Payer: Self-pay

## 2019-07-20 VITALS — BP 120/60 | HR 105 | Temp 97.6°F | Ht 61.5 in | Wt 171.4 lb

## 2019-07-20 DIAGNOSIS — R059 Cough, unspecified: Secondary | ICD-10-CM

## 2019-07-20 DIAGNOSIS — R05 Cough: Secondary | ICD-10-CM | POA: Diagnosis not present

## 2019-07-20 MED ORDER — AZELASTINE HCL 0.1 % NA SOLN: 2.0000 | Freq: Two times a day (BID) | NASAL | 12 refills | Status: DC

## 2019-07-20 MED FILL — AZELASTINE HCL 137 MCG SPRY: 0.1 | 25 days supply | Qty: 30 | Fill #0

## 2019-07-20 NOTE — Progress Notes (Signed)
[name]    [id number]    01/07/1951  Primary Care Physician:O'Sullivan, Lenna Sciara, NP  Referring Physician: [name], NP [address] [place],  [place] [address]  Chief complaint: Chronic cough  HPI: 69 year old with history of hypertension, allergies, chronic headache, sacral radiculopathy with epidural fibrosis.  Complains of chronic cough for the past 3 months.  Treated with Z-Pak, Tessalon, prednisone taper with no improvement in March 2020, this was followed by Augmentin, albuterol inhaler and second round of prednisone and early April.  She was also evaluated in the ED on 07/10/2019 with chest x-ray showing minimal basal atelectasis.  Has constant irritation, congestion at the back of the throat.  Has uncontrolled rhinitis, postnasal drip and acid reflux for which she takes Flonase [which does not help much], Zyrtec, omeprazole once a day  She has history of severe allergies, rhinitis, postnasal drip.  She had sinus surgery at age 19 which resulted in a nasal perforation.  Pets: 4 cats.  She is allergic to cats Occupation: Retired Web designer Exposures: Had mold in the previous home.  She moved out of this home in the year 2000.  No ongoing exposures with mold, hot tub, Jacuzzi.  No down pillows or comforters. Smoking history: 4-pack-year smoker.  Quit in 1990 Travel history: Originally from [place].  Previously lived in [place] Relevant family history: No significant family history of lung disease  Outpatient Encounter Medications as of 07/20/2019  Medication Sig  . amLODipine (NORVASC) 5 MG tablet TAKE 1 TABLET BY MOUTH EVERY DAY.MAX ON INSURANCE  . amphetamine-dextroamphetamine (ADDERALL) 20 MG tablet Take 20 mg by mouth daily.  Marland Kitchen Apoaequorin (PREVAGEN EXTRA STRENGTH) 20 MG CAPS Take by mouth.  . benzonatate (TESSALON) 100 MG capsule TAKE 1 CAPSULE BY MOUTH THREE TIMES A DAY AS NEEDED FOR COUGH  . cetirizine (ZYRTEC) 10 MG  tablet Take 1 tablet (10 mg total) by mouth daily.  . clonazePAM (KLONOPIN) 1 MG tablet TAKE 1 TABLET BY MOUTH THREE TIMES A DAY AS NEEDED FOR ANXIETY  . clotrimazole-betamethasone (LOTRISONE) cream Apply 1 application topically 2 (two) times daily.  . fluticasone (FLONASE) 50 MCG/ACT nasal spray Place 2 sprays into both nostrils daily.  Marland Kitchen FOLIC ACID PO Take by mouth.  Marland Kitchen MAGNESIUM PO Take by mouth.  . mirtazapine (REMERON) 45 MG tablet Take 45 mg by mouth at bedtime.   . Multiple Vitamin (MULTIVITAMIN) capsule Take 1 capsule by mouth daily.  . Multiple Vitamins-Minerals (AIRBORNE PO) Take by mouth.  Marland Kitchen omeprazole (PRILOSEC) 40 MG capsule TAKE 1 CAPSULE BY MOUTH EVERY DAY  . PARoxetine (PAXIL) 40 MG tablet TAKE 1 TABLET BY MOUTH IN  THE MORNING  . Probiotic Product (PROBIOTIC DAILY PO) Take by mouth.  . traZODone (DESYREL) 50 MG tablet   . albuterol (VENTOLIN HFA) 108 (90 Base) MCG/ACT inhaler Inhale 2 puffs into the lungs every 6 (six) hours as needed for wheezing or shortness of breath.  . [DISCONTINUED] HYDROcodone-homatropine (HYCODAN) 5-1.5 MG/5ML syrup Take 5 mLs by mouth every 6 (six) hours as needed for up to 14 days for cough.  . [DISCONTINUED] predniSONE (DELTASONE) 10 MG tablet 4 tablets x 2 days, 3 tabs x 2 days, 2 tabs x 2 days, 1 tab x 2 days   No facility-administered encounter medications on file as of 07/20/2019.    Allergies as of 07/20/2019 - Review Complete 07/20/2019  Allergen Reaction Noted  . Compazine [prochlorperazine]  08/09/2011  .  Morphine and related Hives and Itching 08/09/2011  . Trintellix [vortioxetine] Nausea And Vomiting 02/14/2017    Past Medical History:  Diagnosis Date  . Allergy    seasonal  . Arachnoiditis   . Depression   . Elevated liver enzymes   . Environmental allergies   . GERD (gastroesophageal reflux disease)   . H/O being hospitalized 03/2012   x1 week for nausea  . History of chicken pox   . Hypertension   . Migraine with typical  aura    resolved years ago  . Nausea    chronic nausea     Past Surgical History:  Procedure Laterality Date  . ABDOMINOPLASTY  07/11/09  . EUS N/A 05/05/2012   Procedure: UPPER ENDOSCOPIC ULTRASOUND (EUS) LINEAR;  Surgeon: Beryle Beams, MD;  Location: [facility];  Service: Endoscopy;  Laterality: N/A;  . LAPAROSCOPIC CHOLECYSTECTOMY  2010  . NASAL SINUS SURGERY     "Has had 2-3 surgeries over the years"    Family History  Problem Relation Age of Onset  . Alzheimer's disease Mother   . Hypertension Mother   . Migraines Mother   . Hypertension Father   . Other Father        ?carotid artery aneurysm  . Cancer Brother        mouth and throat    Social History   Socioeconomic History  . Marital status: Married    Spouse name: Not on file  . Number of children: Not on file  . Years of education: Not on file  . Highest education level: Not on file  Occupational History  . Not on file  Tobacco Use  . Smoking status: Former Smoker    Packs/day: 0.25    Years: 8.00    Pack years: 2.00    Types: Cigarettes    Quit date: 08/09/1986    Years since quitting: 32.9  . Smokeless tobacco: Never Used  Substance and Sexual Activity  . Alcohol use: No  . Drug use: No  . Sexual activity: Not Currently    Partners: Male    Birth control/protection: Other-see comments    Comment: husband had vasectomy.   Other Topics Concern  . Not on file  Social History Narrative   2 step daughters- 29 and 80   She worked as an Web designer.   Enjoys puzzles, counted cross stitch.   Completed 1 year of college   4 cats   Social Determinants of Health   Financial Resource Strain:   . Difficulty of Paying Living Expenses:   Food Insecurity:   . Worried About Charity fundraiser in the Last Year:   . Arboriculturist in the Last Year:   Transportation Needs:   . Film/video editor (Medical):   Marland Kitchen Lack of Transportation (Non-Medical):   Physical Activity:   . Days of  Exercise per Week:   . Minutes of Exercise per Session:   Stress:   . Feeling of Stress :   Social Connections:   . Frequency of Communication with Friends and Family:   . Frequency of Social Gatherings with Friends and Family:   . Attends Religious Services:   . Active Member of Clubs or Organizations:   . Attends Archivist Meetings:   Marland Kitchen Marital Status:   Intimate Partner Violence:   . Fear of Current or Ex-Partner:   . Emotionally Abused:   Marland Kitchen Physically Abused:   . Sexually Abused:  Review of systems: Review of Systems  Constitutional: Negative for fever and chills.  HENT: Negative.   Eyes: Negative for blurred vision.  Respiratory: as per HPI  Cardiovascular: Negative for chest pain and palpitations.  Gastrointestinal: Negative for vomiting, diarrhea, blood per rectum. Genitourinary: Negative for dysuria, urgency, frequency and hematuria.  Musculoskeletal: Negative for myalgias, back pain and joint pain.  Skin: Negative for itching and rash.  Neurological: Negative for dizziness, tremors, focal weakness, seizures and loss of consciousness.  Endo/Heme/Allergies: Negative for environmental allergies.  Psychiatric/Behavioral: Negative for depression, suicidal ideas and hallucinations.  All other systems reviewed and are negative.  Physical Exam: Blood pressure 120/60, pulse (!) 105, temperature 97.6 F (36.4 C), temperature source Temporal, height 5' 1.5" (1.562 m), weight 171 lb 6.4 oz (77.7 kg), last menstrual period 12/28/2011, SpO2 92 %. Gen:      No acute distress HEENT:  EOMI, sclera anicteric Neck:     No masses; no thyromegaly Lungs:    Clear to auscultation bilaterally; normal respiratory effort CV:         Regular rate and rhythm; no murmurs Abd:      + bowel sounds; soft, non-tender; no palpable masses, no distension Ext:    No edema; adequate peripheral perfusion Skin:      Warm and dry; no rash Neuro: alert and oriented x 3 Psych: normal mood  and affect  Data Reviewed: Imaging: Chest x-ray 07/10/2019-minimal left basilar scarring/atelectasis.  I have reviewed the images personally.  Assessment:  Chronic cough Likely upper airway cough from postnasal drip, rhinitis, GERD. She has significant allergies and will need testing for asthma.  We will check CBC differential, respiratory allergy profile Schedule pulmonary function tests  Treat postnasal drip with Astelin, chlorphentermine 8 mg 3 times daily.  Continue Flonase Increase omeprazole to 40 mg twice daily.  Chest x-ray may have some left base atelectasis.  If her symptoms continue then consider CT chest.  Plan/Recommendations: CBC, allergy profile Astelin, chlorpheniramine, Flonase Increase omeprazole to twice daily  Marshell Garfinkel MD [facility] 07/20/2019, 3:59 PM  CC: [name], NP

## 2019-07-20 NOTE — Patient Instructions (Signed)
Check blood work today including metabolic panel, CBC differential, respiratory allergy profile Start Astelin, chlorpheniramine 8 mg 3 times daily.  Continue Flonase nasal spray Increase omeprazole to twice daily.  Follow-up in 1 to 2 months.

## 2019-07-23 ENCOUNTER — Telehealth: Payer: Self-pay | Admitting: Pulmonary Disease

## 2019-07-23 ENCOUNTER — Other Ambulatory Visit (HOSPITAL_COMMUNITY)
Admission: RE | Admit: 2019-07-23 | Discharge: 2019-07-23 | Disposition: A | Discharge status: Home / Self Care | Payer: 59 | Source: Ambulatory Visit | Attending: Pulmonary Disease | Admitting: Pulmonary Disease

## 2019-07-23 DIAGNOSIS — Z01812 Encounter for preprocedural laboratory examination: Secondary | ICD-10-CM | POA: Diagnosis not present

## 2019-07-23 DIAGNOSIS — Z20822 Contact with and (suspected) exposure to covid-19: Secondary | ICD-10-CM | POA: Diagnosis not present

## 2019-07-23 LAB — SARS CORONAVIRUS 2 (TAT 6-24 HRS): SARS Coronavirus 2: NEGATIVE

## 2019-07-23 NOTE — Telephone Encounter (Signed)
Pt called back please return call-- pt says anytime today

## 2019-07-23 NOTE — Telephone Encounter (Signed)
LMTCB x1 for pt.  

## 2019-07-23 NOTE — Telephone Encounter (Signed)
Spoke with pt. She was under the impression that 2 medications were going to be sent to her pharmacy after her appointment with Dr. [name], but only one prescription was sent in. Looking at the pt's AVS, she was to start Astelin and Chlorpheniramine. Advised pt that Astelin was a prescription and Chlorpheniramine was OTC. Nothing further was needed.

## 2019-07-25 ENCOUNTER — Ambulatory Visit (INDEPENDENT_AMBULATORY_CARE_PROVIDER_SITE_OTHER): Payer: 59 | Admitting: Pulmonary Disease

## 2019-07-25 ENCOUNTER — Other Ambulatory Visit: Payer: Self-pay

## 2019-07-25 DIAGNOSIS — R05 Cough: Secondary | ICD-10-CM | POA: Diagnosis not present

## 2019-07-25 DIAGNOSIS — R059 Cough, unspecified: Secondary | ICD-10-CM

## 2019-07-25 LAB — PULMONARY FUNCTION TEST
DL/VA % pred: 84 %
DL/VA: 3.59 ml/min/mmHg/L
DLCO unc % pred: 94 %
DLCO unc: 16.91 ml/min/mmHg
FEF 25-75 Post: 3.46 L/sec
FEF 25-75 Pre: 2.36 L/sec
FEF2575-%Change-Post: 46 %
FEF2575-%Pred-Post: 186 %
FEF2575-%Pred-Pre: 127 %
FEV1-%Change-Post: 11 %
FEV1-%Pred-Post: 115 %
FEV1-%Pred-Pre: 103 %
FEV1-Post: 2.42 L
FEV1-Pre: 2.18 L
FEV1FVC-%Change-Post: 3 %
FEV1FVC-%Pred-Pre: 108 %
FEV6-%Change-Post: 7 %
FEV6-%Pred-Post: 107 %
FEV6-%Pred-Pre: 99 %
FEV6-Post: 2.84 L
FEV6-Pre: 2.64 L
FEV6FVC-%Pred-Post: 104 %
FEV6FVC-%Pred-Pre: 104 %
FVC-%Change-Post: 7 %
FVC-%Pred-Post: 102 %
FVC-%Pred-Pre: 95 %
FVC-Post: 2.84 L
FVC-Pre: 2.64 L
Post FEV1/FVC ratio: 85 %
Post FEV6/FVC ratio: 100 %
Pre FEV1/FVC ratio: 83 %
Pre FEV6/FVC Ratio: 100 %
RV % pred: 107 %
RV: 2.17 L
TLC % pred: 110 %
TLC: 5.19 L

## 2019-07-25 MED FILL — CLOTRIMAZOLE-BETAMETHASONE: 1-0.05 | 7 days supply | Qty: 15 | Fill #1

## 2019-07-25 NOTE — Progress Notes (Signed)
PFT completed today.  

## 2019-07-31 ENCOUNTER — Other Ambulatory Visit: Payer: 59

## 2019-07-31 DIAGNOSIS — R059 Cough, unspecified: Secondary | ICD-10-CM

## 2019-07-31 DIAGNOSIS — R05 Cough: Secondary | ICD-10-CM

## 2019-07-31 LAB — COMPREHENSIVE METABOLIC PANEL
ALT: 13 U/L (ref 0–35)
AST: 17 U/L (ref 0–37)
Albumin: 4.3 g/dL (ref 3.5–5.2)
Alkaline Phosphatase: 78 U/L (ref 39–117)
BUN: 17 mg/dL (ref 6–23)
CO2: 28 mEq/L (ref 19–32)
Calcium: 9.3 mg/dL (ref 8.4–10.5)
Chloride: 104 mEq/L (ref 96–112)
Creatinine, Ser: 0.88 mg/dL (ref 0.40–1.20)
GFR: 63.79 mL/min (ref >60.00)
Glucose, Bld: 128 mg/dL — ABNORMAL HIGH (ref 70–99)
Potassium: 3.8 mEq/L (ref 3.5–5.1)
Sodium: 138 mEq/L (ref 135–145)
Total Bilirubin: 0.4 mg/dL (ref 0.2–1.2)
Total Protein: 7 g/dL (ref 6.0–8.3)

## 2019-07-31 LAB — CBC WITH DIFFERENTIAL/PLATELET
Basophils Absolute: 0.1 10*3/uL (ref 0.0–0.1)
Basophils Relative: 1.5 % (ref 0.0–3.0)
Eosinophils Absolute: 0.4 10*3/uL (ref 0.0–0.7)
Eosinophils Relative: 4 % (ref 0.0–5.0)
HCT: 42.8 % (ref 36.0–46.0)
Hemoglobin: 14.4 g/dL (ref 12.0–15.0)
Lymphocytes Relative: 21.8 % (ref 12.0–46.0)
Lymphs Abs: 2 10*3/uL (ref 0.7–4.0)
MCHC: 33.6 g/dL (ref 30.0–36.0)
MCV: 94.9 fl (ref 78.0–100.0)
Monocytes Absolute: 0.7 10*3/uL (ref 0.1–1.0)
Monocytes Relative: 7 % (ref 3.0–12.0)
Neutro Abs: 6.1 10*3/uL (ref 1.4–7.7)
Neutrophils Relative %: 65.7 % (ref 43.0–77.0)
Platelets: 149 10*3/uL — ABNORMAL LOW (ref 150.0–400.0)
RBC: 4.5 Mil/uL (ref 3.87–5.11)
RDW: 13.6 % (ref 11.5–15.5)
WBC: 9.3 10*3/uL (ref 4.0–10.5)

## 2019-08-01 LAB — RESPIRATORY ALLERGY PROFILE REGION II ~~LOC~~

## 2019-08-01 LAB — INTERPRETATION:

## 2019-08-08 ENCOUNTER — Telehealth: Payer: Self-pay | Admitting: Pulmonary Disease

## 2019-08-08 DIAGNOSIS — R059 Cough, unspecified: Secondary | ICD-10-CM

## 2019-08-08 NOTE — Telephone Encounter (Signed)
Called and spoke with pt who is requesting to know the results of the labwork that she had done after last visit.   Pt stated that she is still coughing and states when she feels like she is going to get phlegm up, it stops in her throat. Pt is hoarse, having headaches, and states that her throat is sore from all the coughing.  Pt is taking all meds as prescribed, taking tylenol to help with the headaches and has switched to Excedrin as the tylenol was not helping. Pt is also using nasal sprays, and allergy meds. Pt wants to know what could be recommended to help with symptoms. Dr. [name], please advise.

## 2019-08-09 NOTE — Telephone Encounter (Signed)
Called and spoke with pt letting her know the results of labwork and that we were going to send rx for symbicort 160 as well as tessalon perles to pharmacy for her. Pt stated that she already has an Rx for tessalon which does not help her cough at all.  She is requesting something else to be called in to help with her cough. Dr. [name], please advise.

## 2019-08-09 NOTE — Telephone Encounter (Signed)
Labs show slight elevation in eosinophils otherwise they are normal PFTs are normal as well.  However there are small changes that there may be asthma  We will call in an inhaler called Symbicort 160 bid Tessalon perles 200 mg bid Reassess, discuss in detail at return visit.

## 2019-08-09 NOTE — Telephone Encounter (Signed)
Patient is checking on RX for cough. [facility]. Patient phone number is [phone].

## 2019-08-10 ENCOUNTER — Telehealth: Payer: Self-pay | Admitting: Pulmonary Disease

## 2019-08-10 MED ORDER — HYDROCODONE-HOMATROPINE 5-1.5 MG/5ML PO SYRP: 5.0000 mL | ORAL_SOLUTION | Freq: Four times a day (QID) | ORAL | 0 refills | Status: DC | PRN

## 2019-08-10 MED ORDER — BUDESONIDE-FORMOTEROL FUMARATE 160-4.5 MCG/ACT IN AERO: 2.0000 | INHALATION_SPRAY | Freq: Two times a day (BID) | RESPIRATORY_TRACT | 5 refills | Status: DC

## 2019-08-10 NOTE — Telephone Encounter (Signed)
rx for symbicort has been sent to pharmacy for pt. Called and spoke with pt letting her know this had been done and she verbalized understanding. Nothing further needed.

## 2019-08-10 NOTE — Telephone Encounter (Signed)
Patient received inhaler and hydrocodone cough syrup. Patient is wondering if she should still take her usual allergy medications,cough syrup and if she can take her klonozapan for depression. Wanting to know if this is [place]. Please advise. [phone]    ATC patient [phone] [facility]

## 2019-08-10 NOTE — Telephone Encounter (Signed)
I called to give pt her CT appt info.  Pt would like to know if her inhaler can be called into [facility] as the hydrocodone was called into that location & is ready for her to pick up.

## 2019-08-10 NOTE — Telephone Encounter (Signed)
Called and spoke with pt letting her know that the cough med was sent to pharmacy by Dr. [name] and also let her know that we were going to order CT and she verbalized understanding. Order placed for the CT. Nothing further needed.

## 2019-08-10 NOTE — Telephone Encounter (Signed)
I sent in a prescription for hycodan.  Since she has persistent issues with cough I would like to get better imaging of the lung. Please order HRCT for chronic cough to be done before the clinic visit later this month

## 2019-08-11 NOTE — Telephone Encounter (Signed)
I called and discussed with patients. She will continue her usual meds with the cough syrup

## 2019-08-17 ENCOUNTER — Ambulatory Visit (HOSPITAL_BASED_OUTPATIENT_CLINIC_OR_DEPARTMENT_OTHER)
Admission: RE | Admit: 2019-08-17 | Discharge: 2019-08-17 | Disposition: A | Discharge status: Home / Self Care | Payer: 59 | Source: Ambulatory Visit | Attending: Pulmonary Disease | Admitting: Pulmonary Disease

## 2019-08-17 ENCOUNTER — Other Ambulatory Visit: Payer: Self-pay

## 2019-08-17 DIAGNOSIS — R05 Cough: Secondary | ICD-10-CM | POA: Diagnosis not present

## 2019-08-17 DIAGNOSIS — I2510 Atherosclerotic heart disease of native coronary artery without angina pectoris: Secondary | ICD-10-CM | POA: Diagnosis not present

## 2019-08-17 DIAGNOSIS — R059 Cough, unspecified: Secondary | ICD-10-CM

## 2019-08-22 ENCOUNTER — Ambulatory Visit (INDEPENDENT_AMBULATORY_CARE_PROVIDER_SITE_OTHER): Payer: 59 | Admitting: Pulmonary Disease

## 2019-08-22 ENCOUNTER — Encounter: Payer: Self-pay | Admitting: Pulmonary Disease

## 2019-08-22 ENCOUNTER — Other Ambulatory Visit: Payer: Self-pay

## 2019-08-22 VITALS — BP 138/72 | HR 97 | Temp 98.5°F | Ht 61.0 in | Wt 171.8 lb

## 2019-08-22 DIAGNOSIS — R05 Cough: Secondary | ICD-10-CM | POA: Diagnosis not present

## 2019-08-22 DIAGNOSIS — R059 Cough, unspecified: Secondary | ICD-10-CM

## 2019-08-22 NOTE — Patient Instructions (Signed)
You need to use chlorpheniramine 8 mg 3 times daily.  This can cause some increased sleepiness which you need to watch out for.  This medication is available over-the-counter.  Please check with your pharmacist regarding this.  Continue the Astelin and Flonase Continue omeprazole twice daily We will refer you to Dr. Constance Holster, ENT for further evaluation of your cough and sinus issues

## 2019-08-22 NOTE — Progress Notes (Signed)
[name]    KG:[id number]    1951-02-21  Primary Care Physician:O'Sullivan, Lenna Sciara, NP  Referring Physician: [name], NP [address] [place],   [address]  Chief complaint: Chronic cough  HPI: 69 year old with history of hypertension, allergies, chronic headache, sacral radiculopathy with epidural fibrosis.  Complains of chronic cough for the past 3 months.  Treated with Z-Pak, Tessalon, prednisone taper with no improvement in March 2020, this was followed by Augmentin, albuterol inhaler and second round of prednisone and early April.  She was also evaluated in the ED on 07/10/2019 with chest x-ray showing minimal basal atelectasis.  Has constant irritation, congestion at the back of the throat.  Has uncontrolled rhinitis, postnasal drip and acid reflux for which she takes Flonase [which does not help much], Zyrtec, omeprazole once a day  She has history of severe allergies, rhinitis, postnasal drip.  She had sinus surgery at age 67 which resulted in a nasal perforation.   Pets: 4 cats.  She is allergic to cats Occupation: Retired Web designer Exposures: Had mold in the previous home.  She moved out of this home in the year 2000.  No ongoing exposures with mold, hot tub, Jacuzzi.  No down pillows or comforters. Smoking history: 4-pack-year smoker.  Quit in 1990 Travel history: Originally from [place].  Previously lived in [place] Relevant family history: No significant family history of lung disease  Interval history: Continues to have significant cough, sinus drainage and hoarseness of voice We had prescribed chlorphentermine at last visit but she is not taking that yet.  Continues on Astelin, Flonase and PPI twice daily  Outpatient Encounter Medications as of 08/22/2019  Medication Sig  . albuterol (VENTOLIN HFA) 108 (90 Base) MCG/ACT inhaler Inhale 2 puffs into the lungs every 6 (six) hours as needed for wheezing or  shortness of breath.  Marland Kitchen amLODipine (NORVASC) 5 MG tablet TAKE 1 TABLET BY MOUTH EVERY DAY.MAX ON INSURANCE  . amphetamine-dextroamphetamine (ADDERALL) 20 MG tablet Take 20 mg by mouth daily.  Marland Kitchen Apoaequorin (PREVAGEN EXTRA STRENGTH) 20 MG CAPS Take by mouth.  Marland Kitchen azelastine (ASTELIN) 0.1 % nasal spray Place 2 sprays into both nostrils 2 (two) times daily. Use in each nostril as directed  . budesonide-formoterol (SYMBICORT) 160-4.5 MCG/ACT inhaler Inhale 2 puffs into the lungs 2 (two) times daily.  . cetirizine (ZYRTEC) 10 MG tablet Take 1 tablet (10 mg total) by mouth daily.  . clonazePAM (KLONOPIN) 1 MG tablet TAKE 1 TABLET BY MOUTH THREE TIMES A DAY AS NEEDED FOR ANXIETY  . clotrimazole-betamethasone (LOTRISONE) cream Apply 1 application topically 2 (two) times daily.  . fluticasone (FLONASE) 50 MCG/ACT nasal spray Place 2 sprays into both nostrils daily.  Marland Kitchen FOLIC ACID PO Take by mouth.  Marland Kitchen MAGNESIUM PO Take by mouth.  . mirtazapine (REMERON) 45 MG tablet Take 45 mg by mouth at bedtime.   . Multiple Vitamin (MULTIVITAMIN) capsule Take 1 capsule by mouth daily.  . Multiple Vitamins-Minerals (AIRBORNE PO) Take by mouth.  Marland Kitchen omeprazole (PRILOSEC) 40 MG capsule TAKE 1 CAPSULE BY MOUTH EVERY DAY  . PARoxetine (PAXIL) 40 MG tablet TAKE 1 TABLET BY MOUTH IN  THE MORNING  . Probiotic Product (PROBIOTIC DAILY PO) Take by mouth.  . traZODone (DESYREL) 50 MG tablet   . benzonatate (TESSALON) 100 MG capsule TAKE 1 CAPSULE BY MOUTH THREE TIMES A DAY AS NEEDED FOR COUGH (Patient not taking: Reported on 08/22/2019)  . [  DISCONTINUED] HYDROcodone-homatropine (HYCODAN) 5-1.5 MG/5ML syrup Take 5 mLs by mouth every 6 (six) hours as needed for cough.   No facility-administered encounter medications on file as of 08/22/2019.   Physical Exam: Blood pressure 138/72, pulse 97, temperature 98.5 F (36.9 C), temperature source Oral, height 5\' 1"  (1.549 m), weight 171 lb 12.8 oz (77.9 kg), last menstrual period 12/28/2011,  SpO2 95 %. Gen:      No acute distress HEENT:  EOMI, sclera anicteric Neck:     No masses; no thyromegaly Lungs:    Clear to auscultation bilaterally; normal respiratory effort CV:         Regular rate and rhythm; no murmurs Abd:      + bowel sounds; soft, non-tender; no palpable masses, no distension Ext:    No edema; adequate peripheral perfusion Skin:      Warm and dry; no rash Neuro: alert and oriented x 3 Psych: normal mood and affect  Data Reviewed: Imaging: Chest x-ray 07/10/2019-minimal left basilar scarring/atelectasis.   High-resolution CT 08/17/2019-no evidence of ILD, mild scarring at the lung base-postinfectious, coronary artery disease, aortic atherosclerosis. I have reviewed the images personally.  PFTs 07/25/2019 FVC 2.84 (102%), FEV1 2.42 (115%), F/F 85, TLC 5.19 [110%], DLCO 16.91 [94%] Normal test  Labs: Respiratory allergy profile 5/40/21-IgE 25, RAST panel-negative CBC 07/31/2019-WBC 9.3, eos 4%, absolute eosinophil count 372  Assessment:  Chronic cough Likely upper airway cough from postnasal drip, rhinitis, GERD. May have mild reactive airway disease given PFTs with no overt obstruction but improvement in mid flow rates post bronchodilators and mildly elevated peripheral eosinophils. CT reviewed with no significant ILD  Continue Symbicort  Treat postnasal drip with Astelin, chlorphentermine 8 mg 3 times daily.  Continue Flonase Increase omeprazole to 40 mg twice daily. Referral to ENT for persistent symptoms of postnasal drip and sinusitis.  Plan/Recommendations: Astelin, chlorpheniramine, Flonase Omeprazole twice daily Continue Symbicort ENT referral  Marshell Garfinkel MD [facility] 08/22/2019, 11:39 AM  CC: [name], NP

## 2019-08-28 ENCOUNTER — Other Ambulatory Visit: Payer: Self-pay | Admitting: Family

## 2019-08-28 DIAGNOSIS — K219 Gastro-esophageal reflux disease without esophagitis: Secondary | ICD-10-CM

## 2019-08-28 NOTE — Telephone Encounter (Signed)
Patient states her omeprazole (PRILOSEC) 40 MG capsule Dosage was increased by her Pulmonologist from once a day to twice a day. She is not out of her normal meds and needs more. Please advise

## 2019-08-28 NOTE — Telephone Encounter (Signed)
Rx pended. Please send to pharmacy of her choice.

## 2019-08-31 ENCOUNTER — Other Ambulatory Visit: Payer: Self-pay

## 2019-08-31 MED ORDER — OMEPRAZOLE 40 MG PO CPDR: DELAYED_RELEASE_CAPSULE | ORAL | 1 refills | Status: DC

## 2019-08-31 MED ORDER — OMEPRAZOLE 40 MG PO CPDR
80.0000 mg | DELAYED_RELEASE_CAPSULE | Freq: Every day | ORAL | 3 refills | Status: DC
Start: 2019-08-31 — End: 2019-11-11

## 2019-08-31 NOTE — Telephone Encounter (Signed)
Rx sent to pharmacy   

## 2019-09-12 ENCOUNTER — Telehealth: Payer: Self-pay | Admitting: Pulmonary Disease

## 2019-09-12 NOTE — Telephone Encounter (Signed)
HRCT results faxed to Dr Constance Holster, attn. Jennifer at [phone].  Left detailed message on VM for [facility]. Nothing further at this time.

## 2019-09-19 NOTE — Progress Notes (Signed)
I connected with [name] today by telephone and verified that I am speaking with the correct person using two identifiers. Location patient: home Location provider: work Persons participating in the virtual visit: patient, Marine scientist.    I discussed the limitations, risks, security and privacy concerns of performing an evaluation and management service by telephone and the availability of in person appointments. I also discussed with the patient that there may be a patient responsible charge related to this service. The patient expressed understanding and verbally consented to this telephonic visit.    Interactive audio and video telecommunications were attempted between this provider and patient, however failed, due to patient having technical difficulties OR patient did not have access to video capability.  We continued and completed visit with audio only.  Some vital signs may be absent or patient reported.   Subjective:   [name] is a 69 y.o. female who presents for Medicare Annual (Subsequent) preventive examination.  Review of Systems     Cardiac Risk Factors include: advanced age (>83men, >66 women);hypertension     Objective:     Advanced Directives 09/20/2019 07/10/2019 09/09/2017 09/08/2016 12/13/2014 05/05/2012  Does Patient Have a Medical Advance Directive? No No No Yes No Patient has advance directive, copy not in chart  Type of Advance Directive - - - [place];Living will - Living will  Copy of [place] in Chart? - - - No - copy requested - -  Would patient like information on creating a medical advance directive? No - Patient declined - Yes (MAU/Ambulatory/Procedural Areas - Information given) - Yes - Educational materials given -  Pre-existing out of facility DNR order (yellow form or pink MOST form) - - - - - No    Current Medications (verified) Outpatient Encounter Medications as of 09/20/2019  Medication Sig  . amLODipine (NORVASC)  5 MG tablet TAKE 1 TABLET BY MOUTH EVERY DAY.MAX ON INSURANCE  . amphetamine-dextroamphetamine (ADDERALL) 20 MG tablet Take 20 mg by mouth daily.  Marland Kitchen Apoaequorin (PREVAGEN EXTRA STRENGTH) 20 MG CAPS Take by mouth.  . clonazePAM (KLONOPIN) 1 MG tablet TAKE 1 TABLET BY MOUTH THREE TIMES A DAY AS NEEDED FOR ANXIETY  . clotrimazole-betamethasone (LOTRISONE) cream Apply 1 application topically 2 (two) times daily.  Marland Kitchen FOLIC ACID PO Take by mouth.  Marland Kitchen MAGNESIUM PO Take by mouth.  . mirtazapine (REMERON) 45 MG tablet Take 45 mg by mouth at bedtime.   . Multiple Vitamin (MULTIVITAMIN) capsule Take 1 capsule by mouth daily.  . Multiple Vitamins-Minerals (AIRBORNE PO) Take by mouth.  Marland Kitchen omeprazole (PRILOSEC) 40 MG capsule TAKE 1 CAPSULE BY MOUTH twice daily  . PARoxetine (PAXIL) 40 MG tablet TAKE 1 TABLET BY MOUTH IN  THE MORNING  . Probiotic Product (PROBIOTIC DAILY PO) Take by mouth.  . traZODone (DESYREL) 50 MG tablet   . albuterol (VENTOLIN HFA) 108 (90 Base) MCG/ACT inhaler Inhale 2 puffs into the lungs every 6 (six) hours as needed for wheezing or shortness of breath. (Patient not taking: Reported on 09/20/2019)  . azelastine (ASTELIN) 0.1 % nasal spray Place 2 sprays into both nostrils 2 (two) times daily. Use in each nostril as directed (Patient not taking: Reported on 09/20/2019)  . benzonatate (TESSALON) 100 MG capsule TAKE 1 CAPSULE BY MOUTH THREE TIMES A DAY AS NEEDED FOR COUGH (Patient not taking: Reported on 08/22/2019)  . budesonide-formoterol (SYMBICORT) 160-4.5 MCG/ACT inhaler Inhale 2 puffs into the lungs 2 (two) times daily. (Patient not taking: Reported  on 09/20/2019)  . cetirizine (ZYRTEC) 10 MG tablet Take 1 tablet (10 mg total) by mouth daily. (Patient not taking: Reported on 09/20/2019)  . fluticasone (FLONASE) 50 MCG/ACT nasal spray Place 2 sprays into both nostrils daily. (Patient not taking: Reported on 09/20/2019)  . omeprazole (PRILOSEC) 40 MG capsule Take 2 capsules (80 mg total) by  mouth daily for 60 doses. Please cancel refills on old rx for omeprazole 40 mg once a day (Patient not taking: Reported on 09/20/2019)   No facility-administered encounter medications on file as of 09/20/2019.    Allergies (verified) Compazine [prochlorperazine], Morphine and related, and Trintellix [vortioxetine]   History: Past Medical History:  Diagnosis Date  . Allergy    seasonal  . Arachnoiditis   . Depression   . Elevated liver enzymes   . Environmental allergies   . GERD (gastroesophageal reflux disease)   . H/O being hospitalized 03/2012   x1 week for nausea  . History of chicken pox   . Hypertension   . Migraine with typical aura    resolved years ago  . Nausea    chronic nausea    Past Surgical History:  Procedure Laterality Date  . ABDOMINOPLASTY  07/11/09  . EUS N/A 05/05/2012   Procedure: UPPER ENDOSCOPIC ULTRASOUND (EUS) LINEAR;  Surgeon: Beryle Beams, MD;  Location: [facility];  Service: Endoscopy;  Laterality: N/A;  . LAPAROSCOPIC CHOLECYSTECTOMY  2010  . NASAL SINUS SURGERY     "Has had 2-3 surgeries over the years"   Family History  Problem Relation Age of Onset  . Alzheimer's disease Mother   . Hypertension Mother   . Migraines Mother   . Hypertension Father   . Other Father        ?carotid artery aneurysm  . Cancer Brother        mouth and throat   Social History   Socioeconomic History  . Marital status: Married    Spouse name: Not on file  . Number of children: Not on file  . Years of education: Not on file  . Highest education level: Not on file  Occupational History  . Not on file  Tobacco Use  . Smoking status: Former Smoker    Packs/day: 0.25    Years: 8.00    Pack years: 2.00    Types: Cigarettes    Quit date: 08/09/1986    Years since quitting: 33.1  . Smokeless tobacco: Never Used  Substance and Sexual Activity  . Alcohol use: No  . Drug use: No  . Sexual activity: Not Currently    Partners: Male    Birth  control/protection: Other-see comments    Comment: husband had vasectomy.   Other Topics Concern  . Not on file  Social History Narrative   2 step daughters- 53 and 26   She worked as an Web designer.   Enjoys puzzles, counted cross stitch.   Completed 1 year of college   4 cats   Social Determinants of Health   Financial Resource Strain: Low Risk   . Difficulty of Paying Living Expenses: Not hard at all  Food Insecurity: No Food Insecurity  . Worried About Charity fundraiser in the Last Year: Never true  . Ran Out of Food in the Last Year: Never true  Transportation Needs: No Transportation Needs  . Lack of Transportation (Medical): No  . Lack of Transportation (Non-Medical): No  Physical Activity:   . Days of Exercise per Week:   .  Minutes of Exercise per Session:   Stress:   . Feeling of Stress :   Social Connections:   . Frequency of Communication with Friends and Family:   . Frequency of Social Gatherings with Friends and Family:   . Attends Religious Services:   . Active Member of Clubs or Organizations:   . Attends Archivist Meetings:   Marland Kitchen Marital Status:     Tobacco Counseling Counseling given: Not Answered   Clinical Intake: Pain : No/denies pain     Activities of Daily Living In your present state of health, do you have any difficulty performing the following activities: 09/20/2019 06/27/2019  Hearing? N N  Vision? N N  Difficulty concentrating or making decisions? N N  Walking or climbing stairs? N N  Dressing or bathing? N N  Doing errands, shopping? N N  Preparing Food and eating ? N -  Using the Toilet? N -  In the past six months, have you accidently leaked urine? N -  Do you have problems with loss of bowel control? N -  Managing your Medications? N -  Managing your Finances? N -  Housekeeping or managing your Housekeeping? N -  Some recent data might be hidden    Patient Care Team: [name], NP as PCP -  General (Internal Medicine) Renee Pain, MD as Referring Physician (Plastic Surgery) Cleta Alberts, MD as Referring Physician (Neurology) Juanita Craver, MD as Consulting Physician (Gastroenterology)  Indicate any recent Medical Services you may have received from other than [facility] providers in the past year (date may be approximate).     Assessment:   This is a routine wellness examination for [name].  Dietary issues and exercise activities discussed: Current Exercise Habits: The patient does not participate in regular exercise at present, Exercise limited by: None identified Diet (meal preparation, eat out, water intake, caffeinated beverages, dairy products, fruits and vegetables): well balanced   Goals    .  Continue involvement in social activities    .  Would like to feel 100% (pt-stated)      Depression Screen PHQ 2/9 Scores 09/20/2019 06/27/2018 11/11/2017 09/09/2017 12/15/2016 09/08/2016 08/02/2016  PHQ - 2 Score 1 2 2 1 6 6 6   PHQ- 9 Score - 10 12 - 21 21 21   Exception Documentation - - - - - - -    Fall Risk Fall Risk  09/20/2019 02/16/2019 10/30/2018 09/09/2017 09/08/2016  Falls in the past year? 0 1 (No Data) No Yes  Comment - Emmi Telephone Survey: data to providers prior to load Emmi Telephone Survey: data to providers prior to load - -  Number falls in past yr: 0 1 (No Data) - 1  Comment - Emmi Telephone Survey Actual Response = 2 Emmi Telephone Survey Actual Response =  - -  Injury with Fall? 0 0 - - No  Follow up Education provided;Falls prevention discussed - - - -    Any stairs in or around the home? Yes  If so, are there any without handrails? No  Home free of loose throw rugs in walkways, pet beds, electrical cords, etc? Yes  Adequate lighting in your home to reduce risk of falls? Yes   ASSISTIVE DEVICES UTILIZED TO PREVENT FALLS:  Life alert? No  Use of a cane, walker or w/c? No  Grab bars in the bathroom? Yes  Shower chair or bench in shower? No  Elevated  toilet seat or a handicapped toilet? No   Cognitive  Function: Ad8 score reviewed for issues:  Issues making decisions:no  Less interest in hobbies / activities:no  Repeats questions, stories (family complaining):no  Trouble using ordinary gadgets (microwave, computer, phone):no  Forgets the month or year: no  Mismanaging finances: no  Remembering appts:no  Daily problems with thinking and/or memory:no Ad8 score is=0     MMSE - Mini Mental State Exam 09/09/2017  Orientation to time 5  Orientation to Place 5  Registration 3  Attention/ Calculation 5  Recall 3  Language- name 2 objects 2  Language- repeat 1  Language- follow 3 step command 3  Language- read & follow direction 1  Write a sentence 1  Copy design 1  Total score 30        Immunizations Immunization History  Administered Date(s) Administered  . Influenza, High Dose Seasonal PF 01/09/2016, 12/15/2016, 12/14/2017, 12/01/2018  . Influenza,inj,Quad PF,6+ Mos 12/25/2013, 12/13/2014  . PFIZER SARS-COV-2 Vaccination 05/03/2019, 05/24/2019  . Pneumococcal Conjugate-13 02/17/2018  . Pneumococcal Polysaccharide-23 12/13/2014  . Td 06/06/2008  . Zoster 01/15/2011  . Zoster Recombinat (Shingrix) 12/11/2018     Flu Vaccine status: Up to date Pneumococcal vaccine status: Up to date Covid-19 vaccine status: Completed vaccines  Qualifies for Shingles Vaccine?   Zostavax completed Yes     Screening Tests Health Maintenance  Topic Date Due  . TETANUS/TDAP  06/07/2018  . DEXA SCAN  09/19/2020 (Originally 12/28/2015)  . INFLUENZA VACCINE  10/28/2019  . PNA vac Low Risk Adult (2 of 2 - PPSV23) 12/13/2019  . MAMMOGRAM  03/12/2021  . COLONOSCOPY  05/27/2025  . COVID-19 Vaccine  Completed  . Hepatitis C Screening  Completed    Health Maintenance  Health Maintenance Due  Topic Date Due  . TETANUS/TDAP  06/07/2018    Colorectal cancer screening: Completed 05/28/15 per pt. Repeat every (no recall on file)  years Mammogram status: Completed 03/14/19. Repeat every year Bone Density: pt declines   Lung Cancer Screening: (Low Dose CT Chest recommended if Age 14-80 years, 30 pack-year currently smoking OR have quit w/in 15years.) does not qualify.    Additional Screening:  Hepatitis C Screening:  Completed 06/13/15  Vision Screening: Recommended annual ophthalmology exams for early detection of glaucoma and other disorders of the eye. Is the patient up to date with their annual eye exam?  Yes  per pt Who is the provider or what is the name of the office in which the patient attends annual eye exams? [facility].   Dental Screening: Recommended annual dental exams for proper oral hygiene  Community Resource Referral / Chronic Care Management: CRR required this visit?  No   CCM required this visit?  No      Plan:    Please schedule your next medicare wellness visit with me in 1 yr.  Continue to eat heart healthy diet (full of fruits, vegetables, whole grains, lean protein, water--limit salt, fat, and sugar intake) and increase physical activity as tolerated.  Continue doing brain stimulating activities (puzzles, reading, adult coloring books, staying active) to keep memory sharp.   I have personally reviewed and noted the following in the patient's chart:   . Medical and social history . Use of alcohol, tobacco or illicit drugs  . Current medications and supplements . Functional ability and status . Nutritional status . Physical activity . Advanced directives . List of other physicians . Hospitalizations, surgeries, and ER visits in previous 12 months . Vitals . Screenings to include cognitive, depression, and falls .  Referrals and appointments  In addition, I have reviewed and discussed with patient certain preventive protocols, quality metrics, and best practice recommendations. A written personalized care plan for preventive services as well as general preventive health  recommendations were provided to patient.   Due to this being a telephonic visit, the after visit summary with patients personalized plan was offered to patient via mail or my-chart.  Patient would like to access on my-chart.  Shela Nevin, [place]   09/20/2019   Nurse Notes: Lives with husband in 2 story home.

## 2019-09-20 ENCOUNTER — Other Ambulatory Visit: Payer: Self-pay

## 2019-09-20 ENCOUNTER — Ambulatory Visit (INDEPENDENT_AMBULATORY_CARE_PROVIDER_SITE_OTHER): Payer: 59 | Admitting: *Deleted

## 2019-09-20 DIAGNOSIS — Z0000 Encounter for general adult medical examination without abnormal findings: Secondary | ICD-10-CM | POA: Diagnosis not present

## 2019-09-20 NOTE — Patient Instructions (Signed)
[name] , Thank you for taking time to come for your Medicare Wellness Visit. I appreciate your ongoing commitment to your health goals. Please review the following plan we discussed and let me know if I can assist you in the future.   Screening recommendations/referrals: Colorectal cancer screening: Completed 05/28/15 per pt. Repeat every (no recall on file) years Mammogram status: Completed 03/14/19. Repeat every year Bone Density: pt declines  Recommended yearly ophthalmology/optometry visit for glaucoma screening and checkup Recommended yearly dental visit for hygiene and checkup  Vaccinations: Flu Vaccine status: Up to date Pneumococcal vaccine status: Up to date Covid-19 vaccine status: Completed vaccines  Advanced directives: Bring a copy of your living will and/or healthcare power of attorney to your next office visit.   Next appointment: Follow up in one year for your annual wellness visit  Preventive Care 65 Years and Older, Female Preventive care refers to lifestyle choices and visits with your health care provider that can promote health and wellness. What does preventive care include?  A yearly physical exam. This is also called an annual well check.  Dental exams once or twice a year.  Routine eye exams. Ask your health care provider how often you should have your eyes checked.  Personal lifestyle choices, including:  Daily care of your teeth and gums.  Regular physical activity.  Eating a healthy diet.  Avoiding tobacco and drug use.  Limiting alcohol use.  Practicing safe sex.  Taking low-dose aspirin every day.  Taking vitamin and mineral supplements as recommended by your health care provider. What happens during an annual well check? The services and screenings done by your health care provider during your annual well check will depend on your age, overall health, lifestyle risk factors, and family history of disease. Counseling  Your health care  provider may ask you questions about your:  Alcohol use.  Tobacco use.  Drug use.  Emotional well-being.  Home and relationship well-being.  Sexual activity.  Eating habits.  History of falls.  Memory and ability to understand (cognition).  Work and work Statistician.  Reproductive health. Screening  You may have the following tests or measurements:  Height, weight, and BMI.  Blood pressure.  Lipid and cholesterol levels. These may be checked every 5 years, or more frequently if you are over 45 years old.  Skin check.  Lung cancer screening. You may have this screening every year starting at age 38 if you have a 30-pack-year history of smoking and currently smoke or have quit within the past 15 years.  Fecal occult blood test (FOBT) of the stool. You may have this test every year starting at age 44.  Flexible sigmoidoscopy or colonoscopy. You may have a sigmoidoscopy every 5 years or a colonoscopy every 10 years starting at age 17.  Hepatitis C blood test.  Hepatitis B blood test.  Sexually transmitted disease (STD) testing.  Diabetes screening. This is done by checking your blood sugar (glucose) after you have not eaten for a while (fasting). You may have this done every 1-3 years.  Bone density scan. This is done to screen for osteoporosis. You may have this done starting at age 26.  Mammogram. This may be done every 1-2 years. Talk to your health care provider about how often you should have regular mammograms. Talk with your health care provider about your test results, treatment options, and if necessary, the need for more tests. Vaccines  Your health care provider may recommend certain vaccines, such as:  Influenza vaccine. This is recommended every year.  Tetanus, diphtheria, and acellular pertussis (Tdap, Td) vaccine. You may need a Td booster every 10 years.  Zoster vaccine. You may need this after age 63.  Pneumococcal 13-valent conjugate (PCV13)  vaccine. One dose is recommended after age 48.  Pneumococcal polysaccharide (PPSV23) vaccine. One dose is recommended after age 45. Talk to your health care provider about which screenings and vaccines you need and how often you need them. This information is not intended to replace advice given to you by your health care provider. Make sure you discuss any questions you have with your health care provider. Document Released: 04/11/2015 Document Revised: 12/03/2015 Document Reviewed: 01/14/2015 Elsevier Interactive Patient Education  2017 [place] Prevention in the Home Falls can cause injuries. They can happen to people of all ages. There are many things you can do to make your home safe and to help prevent falls. What can I do on the outside of my home?  Regularly fix the edges of walkways and driveways and fix any cracks.  Remove anything that might make you trip as you walk through a door, such as a raised step or threshold.  Trim any bushes or trees on the path to your home.  Use bright outdoor lighting.  Clear any walking paths of anything that might make someone trip, such as rocks or tools.  Regularly check to see if handrails are loose or broken. Make sure that both sides of any steps have handrails.  Any raised decks and porches should have guardrails on the edges.  Have any leaves, snow, or ice cleared regularly.  Use sand or salt on walking paths during winter.  Clean up any spills in your garage right away. This includes oil or grease spills. What can I do in the bathroom?  Use night lights.  Install grab bars by the toilet and in the tub and shower. Do not use towel bars as grab bars.  Use non-skid mats or decals in the tub or shower.  If you need to sit down in the shower, use a plastic, non-slip stool.  Keep the floor dry. Clean up any water that spills on the floor as soon as it happens.  Remove soap buildup in the tub or shower  regularly.  Attach bath mats securely with double-sided non-slip rug tape.  Do not have throw rugs and other things on the floor that can make you trip. What can I do in the bedroom?  Use night lights.  Make sure that you have a light by your bed that is easy to reach.  Do not use any sheets or blankets that are too big for your bed. They should not hang down onto the floor.  Have a firm chair that has side arms. You can use this for support while you get dressed.  Do not have throw rugs and other things on the floor that can make you trip. What can I do in the kitchen?  Clean up any spills right away.  Avoid walking on wet floors.  Keep items that you use a lot in easy-to-reach places.  If you need to reach something above you, use a strong step stool that has a grab bar.  Keep electrical cords out of the way.  Do not use floor polish or wax that makes floors slippery. If you must use wax, use non-skid floor wax.  Do not have throw rugs and other things on the floor that  can make you trip. What can I do with my stairs?  Do not leave any items on the stairs.  Make sure that there are handrails on both sides of the stairs and use them. Fix handrails that are broken or loose. Make sure that handrails are as long as the stairways.  Check any carpeting to make sure that it is firmly attached to the stairs. Fix any carpet that is loose or worn.  Avoid having throw rugs at the top or bottom of the stairs. If you do have throw rugs, attach them to the floor with carpet tape.  Make sure that you have a light switch at the top of the stairs and the bottom of the stairs. If you do not have them, ask someone to add them for you. What else can I do to help prevent falls?  Wear shoes that:  Do not have high heels.  Have rubber bottoms.  Are comfortable and fit you well.  Are closed at the toe. Do not wear sandals.  If you use a stepladder:  Make sure that it is fully  opened. Do not climb a closed stepladder.  Make sure that both sides of the stepladder are locked into place.  Ask someone to hold it for you, if possible.  Clearly mark and make sure that you can see:  Any grab bars or handrails.  First and last steps.  Where the edge of each step is.  Use tools that help you move around (mobility aids) if they are needed. These include:  Canes.  Walkers.  Scooters.  Crutches.  Turn on the lights when you go into a dark area. Replace any light bulbs as soon as they burn out.  Set up your furniture so you have a clear path. Avoid moving your furniture around.  If any of your floors are uneven, fix them.  If there are any pets around you, be aware of where they are.  Review your medicines with your doctor. Some medicines can make you feel dizzy. This can increase your chance of falling. Ask your doctor what other things that you can do to help prevent falls. This information is not intended to replace advice given to you by your health care provider. Make sure you discuss any questions you have with your health care provider. Document Released: 01/09/2009 Document Revised: 08/21/2015 Document Reviewed: 04/19/2014 Elsevier Interactive Patient Education  2017 [employer].

## 2019-10-02 ENCOUNTER — Other Ambulatory Visit: Payer: Self-pay | Admitting: Family

## 2019-10-03 DIAGNOSIS — H1045 Other chronic allergic conjunctivitis: Secondary | ICD-10-CM | POA: Diagnosis not present

## 2019-10-03 DIAGNOSIS — K219 Gastro-esophageal reflux disease without esophagitis: Secondary | ICD-10-CM | POA: Diagnosis not present

## 2019-10-03 DIAGNOSIS — R05 Cough: Secondary | ICD-10-CM | POA: Diagnosis not present

## 2019-10-03 DIAGNOSIS — J3089 Other allergic rhinitis: Secondary | ICD-10-CM | POA: Diagnosis not present

## 2019-11-10 ENCOUNTER — Other Ambulatory Visit: Payer: Self-pay | Admitting: Family

## 2019-11-26 ENCOUNTER — Ambulatory Visit: Payer: 59 | Admitting: Pulmonary Disease

## 2019-12-26 ENCOUNTER — Other Ambulatory Visit: Payer: Self-pay | Admitting: Family

## 2019-12-30 ENCOUNTER — Encounter (HOSPITAL_BASED_OUTPATIENT_CLINIC_OR_DEPARTMENT_OTHER): Payer: Self-pay | Admitting: Emergency Medicine

## 2019-12-30 ENCOUNTER — Emergency Department (HOSPITAL_BASED_OUTPATIENT_CLINIC_OR_DEPARTMENT_OTHER)
Admission: EM | Admit: 2019-12-30 | Discharge: 2019-12-30 | Disposition: A | Discharge status: Home / Self Care | Payer: 59 | Attending: Emergency Medicine | Admitting: Emergency Medicine

## 2019-12-30 ENCOUNTER — Other Ambulatory Visit: Payer: Self-pay

## 2019-12-30 DIAGNOSIS — K6289 Other specified diseases of anus and rectum: Secondary | ICD-10-CM | POA: Diagnosis not present

## 2019-12-30 DIAGNOSIS — R197 Diarrhea, unspecified: Secondary | ICD-10-CM | POA: Diagnosis not present

## 2019-12-30 DIAGNOSIS — Z5321 Procedure and treatment not carried out due to patient leaving prior to being seen by health care provider: Secondary | ICD-10-CM | POA: Diagnosis not present

## 2019-12-30 DIAGNOSIS — R109 Unspecified abdominal pain: Secondary | ICD-10-CM | POA: Insufficient documentation

## 2019-12-30 LAB — COMPREHENSIVE METABOLIC PANEL
ALT: 27 U/L (ref 0–44)
AST: 29 U/L (ref 15–41)
Albumin: 4 g/dL (ref 3.5–5.0)
Alkaline Phosphatase: 79 U/L (ref 38–126)
Anion gap: 11 (ref 5–15)
BUN: 13 mg/dL (ref 8–23)
CO2: 23 mmol/L (ref 22–32)
Calcium: 9.2 mg/dL (ref 8.9–10.3)
Chloride: 105 mmol/L (ref 98–111)
Creatinine, Ser: 0.97 mg/dL (ref 0.44–1.00)
GFR calc Af Amer: >60 mL/min (ref >60)
GFR calc non Af Amer: 60 mL/min — ABNORMAL LOW (ref >60)
Glucose, Bld: 94 mg/dL (ref 70–99)
Potassium: 3.2 mmol/L — ABNORMAL LOW (ref 3.5–5.1)
Sodium: 139 mmol/L (ref 135–145)
Total Bilirubin: 0.3 mg/dL (ref 0.3–1.2)
Total Protein: 7 g/dL (ref 6.5–8.1)

## 2019-12-30 LAB — CBC
HCT: 40.3 % (ref 36.0–46.0)
Hemoglobin: 13.4 g/dL (ref 12.0–15.0)
MCH: 29.8 pg (ref 26.0–34.0)
MCHC: 33.3 g/dL (ref 30.0–36.0)
MCV: 89.8 fL (ref 80.0–100.0)
Platelets: 150 10*3/uL (ref 150–400)
RBC: 4.49 MIL/uL (ref 3.87–5.11)
RDW: 13.2 % (ref 11.5–15.5)
WBC: 7 10*3/uL (ref 4.0–10.5)
nRBC: 0 % (ref 0.0–0.2)

## 2019-12-30 LAB — LIPASE, BLOOD: Lipase: 40 U/L (ref 11–51)

## 2019-12-30 NOTE — ED Notes (Signed)
Notified registration that she is leaving. LWBS after triage.

## 2019-12-30 NOTE — ED Notes (Signed)
Pt aware urine specimen ordered. Pt reports inability to provide specimen at this time. Specimen collection device provided to patient. 

## 2019-12-30 NOTE — ED Triage Notes (Signed)
Pt reports diarrhea x 1 week with rectal pain. Also endorses intermittent abdominal cramping. Pt denies fever, denies abx. Pt requested to use restroom during triage. Recent travel, endorses Covid vaccine and booster

## 2020-01-03 ENCOUNTER — Telehealth: Payer: 59 | Admitting: Family Medicine

## 2020-01-06 ENCOUNTER — Encounter: Payer: Self-pay | Admitting: Family

## 2020-01-07 NOTE — Telephone Encounter (Signed)
Patient called requesting to be seen by a provider before Wednesday. Patient states abdominal pain and diarrhea  for over two weeks now. Patient went to ED and left before treatment complete due to the wait time.  Please Advise

## 2020-01-07 NOTE — Telephone Encounter (Signed)
Called patient and patient has a scheduled appointment with pcp on 01/09/20@10 :40 am.  (Abdominal pain, vomiting, and diarrhea for 2 wks.) pt. States.

## 2020-01-08 ENCOUNTER — Telehealth (INDEPENDENT_AMBULATORY_CARE_PROVIDER_SITE_OTHER): Payer: 59 | Admitting: Family

## 2020-01-08 ENCOUNTER — Inpatient Hospital Stay (HOSPITAL_BASED_OUTPATIENT_CLINIC_OR_DEPARTMENT_OTHER)
Admission: EM | Admit: 2020-01-08 | Discharge: 2020-01-10 | DRG: 683 | Disposition: A | Discharge status: Home / Self Care | Payer: 59 | Attending: Internal Medicine | Admitting: Internal Medicine

## 2020-01-08 ENCOUNTER — Other Ambulatory Visit: Payer: Self-pay

## 2020-01-08 ENCOUNTER — Encounter (HOSPITAL_BASED_OUTPATIENT_CLINIC_OR_DEPARTMENT_OTHER): Payer: Self-pay | Admitting: *Deleted

## 2020-01-08 DIAGNOSIS — K219 Gastro-esophageal reflux disease without esophagitis: Secondary | ICD-10-CM | POA: Diagnosis present

## 2020-01-08 DIAGNOSIS — N179 Acute kidney failure, unspecified: Secondary | ICD-10-CM | POA: Diagnosis not present

## 2020-01-08 DIAGNOSIS — K529 Noninfective gastroenteritis and colitis, unspecified: Secondary | ICD-10-CM

## 2020-01-08 DIAGNOSIS — Z820 Family history of epilepsy and other diseases of the nervous system: Secondary | ICD-10-CM

## 2020-01-08 DIAGNOSIS — Z20822 Contact with and (suspected) exposure to covid-19: Secondary | ICD-10-CM | POA: Diagnosis not present

## 2020-01-08 DIAGNOSIS — K580 Irritable bowel syndrome with diarrhea: Secondary | ICD-10-CM | POA: Diagnosis present

## 2020-01-08 DIAGNOSIS — E860 Dehydration: Secondary | ICD-10-CM | POA: Diagnosis not present

## 2020-01-08 DIAGNOSIS — F32A Depression, unspecified: Secondary | ICD-10-CM | POA: Diagnosis present

## 2020-01-08 DIAGNOSIS — R112 Nausea with vomiting, unspecified: Secondary | ICD-10-CM | POA: Diagnosis present

## 2020-01-08 DIAGNOSIS — N3000 Acute cystitis without hematuria: Secondary | ICD-10-CM

## 2020-01-08 DIAGNOSIS — I10 Essential (primary) hypertension: Secondary | ICD-10-CM | POA: Diagnosis present

## 2020-01-08 DIAGNOSIS — I959 Hypotension, unspecified: Secondary | ICD-10-CM | POA: Diagnosis present

## 2020-01-08 DIAGNOSIS — Z885 Allergy status to narcotic agent status: Secondary | ICD-10-CM

## 2020-01-08 DIAGNOSIS — D696 Thrombocytopenia, unspecified: Secondary | ICD-10-CM | POA: Diagnosis present

## 2020-01-08 DIAGNOSIS — Z8249 Family history of ischemic heart disease and other diseases of the circulatory system: Secondary | ICD-10-CM

## 2020-01-08 DIAGNOSIS — B9620 Unspecified Escherichia coli [E. coli] as the cause of diseases classified elsewhere: Secondary | ICD-10-CM | POA: Diagnosis present

## 2020-01-08 DIAGNOSIS — R1115 Cyclical vomiting syndrome unrelated to migraine: Secondary | ICD-10-CM | POA: Diagnosis present

## 2020-01-08 DIAGNOSIS — R8271 Bacteriuria: Secondary | ICD-10-CM | POA: Insufficient documentation

## 2020-01-08 DIAGNOSIS — Z87891 Personal history of nicotine dependence: Secondary | ICD-10-CM

## 2020-01-08 DIAGNOSIS — F419 Anxiety disorder, unspecified: Secondary | ICD-10-CM | POA: Diagnosis present

## 2020-01-08 DIAGNOSIS — E876 Hypokalemia: Secondary | ICD-10-CM | POA: Diagnosis not present

## 2020-01-08 DIAGNOSIS — R197 Diarrhea, unspecified: Secondary | ICD-10-CM | POA: Diagnosis present

## 2020-01-08 LAB — CBC WITH DIFFERENTIAL/PLATELET
Abs Immature Granulocytes: 0.05 10*3/uL (ref 0.00–0.07)
Basophils Absolute: 0.1 10*3/uL (ref 0.0–0.1)
Basophils Relative: 1 %
Eosinophils Absolute: 0.3 10*3/uL (ref 0.0–0.5)
Eosinophils Relative: 3 %
HCT: 44.5 % (ref 36.0–46.0)
Hemoglobin: 15.4 g/dL — ABNORMAL HIGH (ref 12.0–15.0)
Immature Granulocytes: 1 %
Lymphocytes Relative: 26 %
Lymphs Abs: 2.7 10*3/uL (ref 0.7–4.0)
MCH: 30 pg (ref 26.0–34.0)
MCHC: 34.6 g/dL (ref 30.0–36.0)
MCV: 86.7 fL (ref 80.0–100.0)
Monocytes Absolute: 1.3 10*3/uL — ABNORMAL HIGH (ref 0.1–1.0)
Monocytes Relative: 13 %
Neutro Abs: 5.9 10*3/uL (ref 1.7–7.7)
Neutrophils Relative %: 56 %
Platelets: 121 10*3/uL — ABNORMAL LOW (ref 150–400)
RBC: 5.13 MIL/uL — ABNORMAL HIGH (ref 3.87–5.11)
RDW: 13.2 % (ref 11.5–15.5)
WBC: 10.4 10*3/uL (ref 4.0–10.5)
nRBC: 0 % (ref 0.0–0.2)

## 2020-01-08 LAB — LIPASE, BLOOD: Lipase: 55 U/L — ABNORMAL HIGH (ref 11–51)

## 2020-01-08 LAB — URINALYSIS, MICROSCOPIC (REFLEX)

## 2020-01-08 LAB — COMPREHENSIVE METABOLIC PANEL
ALT: 21 U/L (ref 0–44)
AST: 29 U/L (ref 15–41)
Albumin: 4.3 g/dL (ref 3.5–5.0)
Alkaline Phosphatase: 83 U/L (ref 38–126)
Anion gap: 15 (ref 5–15)
BUN: 26 mg/dL — ABNORMAL HIGH (ref 8–23)
CO2: 20 mmol/L — ABNORMAL LOW (ref 22–32)
Calcium: 9.5 mg/dL (ref 8.9–10.3)
Chloride: 102 mmol/L (ref 98–111)
Creatinine, Ser: 1.72 mg/dL — ABNORMAL HIGH (ref 0.44–1.00)
GFR, Estimated: 30 mL/min — ABNORMAL LOW (ref >60)
Glucose, Bld: 83 mg/dL (ref 70–99)
Potassium: 3.3 mmol/L — ABNORMAL LOW (ref 3.5–5.1)
Sodium: 137 mmol/L (ref 135–145)
Total Bilirubin: 0.7 mg/dL (ref 0.3–1.2)
Total Protein: 7.5 g/dL (ref 6.5–8.1)

## 2020-01-08 LAB — RESPIRATORY PANEL BY RT PCR (FLU A&B, COVID)
Influenza A by PCR: NEGATIVE
Influenza B by PCR: NEGATIVE
SARS Coronavirus 2 by RT PCR: NEGATIVE

## 2020-01-08 LAB — URINALYSIS, ROUTINE W REFLEX MICROSCOPIC
Glucose, UA: NEGATIVE mg/dL
Hgb urine dipstick: NEGATIVE
Ketones, ur: 40 mg/dL — AB
Nitrite: POSITIVE — AB
Protein, ur: NEGATIVE mg/dL
Specific Gravity, Urine: 1.025 (ref 1.005–1.030)
pH: 5.5 (ref 5.0–8.0)

## 2020-01-08 LAB — MAGNESIUM: Magnesium: 1.8 mg/dL (ref 1.7–2.4)

## 2020-01-08 MED ORDER — ACETAMINOPHEN 325 MG PO TABS
650.0000 mg | ORAL_TABLET | Freq: Once | ORAL | Status: AC
  Administered 2020-01-08: 650 mg via ORAL
  Filled 2020-01-08: qty 2

## 2020-01-08 MED ORDER — SODIUM CHLORIDE 0.9 % IV SOLN: Freq: Once | INTRAVENOUS | Status: AC

## 2020-01-08 MED ORDER — POTASSIUM CHLORIDE CRYS ER 20 MEQ PO TBCR
20.0000 meq | EXTENDED_RELEASE_TABLET | Freq: Once | ORAL | Status: AC
  Administered 2020-01-08: 20 meq via ORAL
  Filled 2020-01-08: qty 1

## 2020-01-08 MED ORDER — SODIUM CHLORIDE 0.9 % IV SOLN
1.0000 g | Freq: Once | INTRAVENOUS | Status: AC
  Administered 2020-01-08: 1 g via INTRAVENOUS
  Filled 2020-01-08: qty 10

## 2020-01-08 MED ORDER — SODIUM CHLORIDE 0.9 % IV BOLUS
1000.0000 mL | Freq: Once | INTRAVENOUS | Status: AC
  Administered 2020-01-08: 1000 mL via INTRAVENOUS

## 2020-01-08 MED ORDER — ONDANSETRON HCL 4 MG/2ML IJ SOLN
4.0000 mg | Freq: Once | INTRAMUSCULAR | Status: AC
  Administered 2020-01-08: 4 mg via INTRAVENOUS
  Filled 2020-01-08: qty 2

## 2020-01-08 NOTE — Progress Notes (Signed)
Virtual Visit via Video Note  I connected with [name] on 01/08/20 at  5:20 PM EDT by a video enabled telemedicine application and verified that I am speaking with the correct person using two identifiers.  Location: Patient: home Provider: work   I discussed the limitations of evaluation and management by telemedicine and the availability of in person appointments. The patient expressed understanding and agreed to proceed. The patient, her husband, and myself were present for today's video call.   History of Present Illness:  Patient is a 69 yr old female who presents today to discuss diarrhea/vomiting. Reports that she traveled to see friends/family on 12/25/19. Around 9/29 she developed "violent diarrhea" which occurred 10-15 times a day. This was accompanied by vomitting.  Tried imodium and pepto bismol without improvement in her symptoms. Reports the only solids she has had in the past 13 days are a few noodles and a 1/2 banana today.  Reports that anything she tries to eat or drink she either vomits up or induces diarrhea.  She reports weakness.  Has only had 1/2 banana and 5 sips of water today.  Reports mouth is dry.   Today she has had 1 loose stool. She has only had 5 sips of water and 1/2 banana this AM.  Of note- she did try to go to the ER on 10/3 but waited 5 hrs and soiled all of the clothing she brought. She had lab work which noted hypokalemia but ultimately left before she saw the provider.   Past Medical History:  Diagnosis Date  . Allergy    seasonal  . Arachnoiditis   . Depression   . Elevated liver enzymes   . Environmental allergies   . GERD (gastroesophageal reflux disease)   . H/O being hospitalized 03/2012   x1 week for nausea  . History of chicken pox   . Hypertension   . Migraine with typical aura    resolved years ago  . Nausea    chronic nausea      Social History   Socioeconomic History  . Marital status: Married    Spouse name: Not on file   . Number of children: Not on file  . Years of education: Not on file  . Highest education level: Not on file  Occupational History  . Not on file  Tobacco Use  . Smoking status: Former Smoker    Packs/day: 0.25    Years: 8.00    Pack years: 2.00    Types: Cigarettes    Quit date: 08/09/1986    Years since quitting: 33.4  . Smokeless tobacco: Never Used  Substance and Sexual Activity  . Alcohol use: No  . Drug use: No  . Sexual activity: Not Currently    Partners: Male    Birth control/protection: Other-see comments    Comment: husband had vasectomy.   Other Topics Concern  . Not on file  Social History Narrative   2 step daughters- 102 and 88   She worked as an Web designer.   Enjoys puzzles, counted cross stitch.   Completed 1 year of college   4 cats   Social Determinants of Health   Financial Resource Strain: Low Risk   . Difficulty of Paying Living Expenses: Not hard at all  Food Insecurity: No Food Insecurity  . Worried About Charity fundraiser in the Last Year: Never true  . Ran Out of Food in the Last Year: Never true  Transportation Needs: No Transportation  Needs  . Lack of Transportation (Medical): No  . Lack of Transportation (Non-Medical): No  Physical Activity:   . Days of Exercise per Week: Not on file  . Minutes of Exercise per Session: Not on file  Stress:   . Feeling of Stress : Not on file  Social Connections:   . Frequency of Communication with Friends and Family: Not on file  . Frequency of Social Gatherings with Friends and Family: Not on file  . Attends Religious Services: Not on file  . Active Member of Clubs or Organizations: Not on file  . Attends Archivist Meetings: Not on file  . Marital Status: Not on file  Intimate Partner Violence:   . Fear of Current or Ex-Partner: Not on file  . Emotionally Abused: Not on file  . Physically Abused: Not on file  . Sexually Abused: Not on file    Past Surgical History:   Procedure Laterality Date  . ABDOMINOPLASTY  07/11/09  . EUS N/A 05/05/2012   Procedure: UPPER ENDOSCOPIC ULTRASOUND (EUS) LINEAR;  Surgeon: Beryle Beams, MD;  Location: [facility];  Service: Endoscopy;  Laterality: N/A;  . LAPAROSCOPIC CHOLECYSTECTOMY  2010  . NASAL SINUS SURGERY     "Has had 2-3 surgeries over the years"    Family History  Problem Relation Age of Onset  . Alzheimer's disease Mother   . Hypertension Mother   . Migraines Mother   . Hypertension Father   . Other Father        ?carotid artery aneurysm  . Cancer Brother        mouth and throat    Allergies  Allergen Reactions  . Compazine [Prochlorperazine]     hyperactivity  . Morphine And Related Hives and Itching  . Trintellix [Vortioxetine] Nausea And Vomiting    Current Outpatient Medications on File Prior to Visit  Medication Sig Dispense Refill  . amLODipine (NORVASC) 5 MG tablet TAKE 1 TABLET BY MOUTH EVERY DAY.MAX ON INSURANCE 30 tablet 5  . amphetamine-dextroamphetamine (ADDERALL) 20 MG tablet Take 20 mg by mouth daily.    Marland Kitchen Apoaequorin (PREVAGEN EXTRA STRENGTH) 20 MG CAPS Take by mouth.    . clonazePAM (KLONOPIN) 1 MG tablet TAKE 1 TABLET BY MOUTH THREE TIMES A DAY AS NEEDED FOR ANXIETY 90 tablet 0  . clotrimazole-betamethasone (LOTRISONE) cream Apply 1 application topically 2 (two) times daily. 30 g 0  . mirtazapine (REMERON) 45 MG tablet Take 45 mg by mouth at bedtime.     . Multiple Vitamin (MULTIVITAMIN) capsule Take 1 capsule by mouth daily.    Marland Kitchen PARoxetine (PAXIL) 40 MG tablet TAKE 1 TABLET BY MOUTH IN  THE MORNING 90 tablet 3  . Probiotic Product (PROBIOTIC DAILY PO) Take by mouth.    . traZODone (DESYREL) 50 MG tablet     . albuterol (VENTOLIN HFA) 108 (90 Base) MCG/ACT inhaler Inhale 2 puffs into the lungs every 6 (six) hours as needed for wheezing or shortness of breath. (Patient not taking: Reported on 09/20/2019) 18 g 0  . azelastine (ASTELIN) 0.1 % nasal spray Place 2 sprays into both  nostrils 2 (two) times daily. Use in each nostril as directed (Patient not taking: Reported on 09/20/2019) 30 mL 12  . benzonatate (TESSALON) 100 MG capsule TAKE 1 CAPSULE BY MOUTH THREE TIMES A DAY AS NEEDED FOR COUGH (Patient not taking: Reported on 08/22/2019) 30 capsule 0  . budesonide-formoterol (SYMBICORT) 160-4.5 MCG/ACT inhaler Inhale 2 puffs into the lungs 2 (two)  times daily. (Patient not taking: Reported on 09/20/2019) 1 Inhaler 5  . cetirizine (ZYRTEC) 10 MG tablet Take 1 tablet (10 mg total) by mouth daily. (Patient not taking: Reported on 09/20/2019) 30 tablet 11  . fluticasone (FLONASE) 50 MCG/ACT nasal spray Place 2 sprays into both nostrils daily. (Patient not taking: Reported on 09/20/2019) 16 g 5  . FOLIC ACID PO Take by mouth.    Marland Kitchen MAGNESIUM PO Take by mouth.    . Multiple Vitamins-Minerals (AIRBORNE PO) Take by mouth.    Marland Kitchen omeprazole (PRILOSEC) 40 MG capsule Take 1 capsule (40 mg total) by mouth in the morning and at bedtime for 60 doses. Please cancel refills on old rx for omeprazole 40 mg once a day 60 capsule 2   No current facility-administered medications on file prior to visit.    LMP 12/28/2011   BP 113/76 heart rate 86    Observations/Objective:   Gen: Awake, alert, weak appearing Resp: Breathing is even and non-labored Psych: calm/pleasant demeanor Neuro: Alert and Oriented x 3, + facial symmetry, speech is clear.   Assessment and Plan:  Gastroenteritis- etiology is unclear. ? Viral versus bacterial (? c diff is a possibility). She needs lab work, IV fluid and abdominal imaging.  At this point I think she is likely very dehydrated.  I have advised her to proceed to the [facility] now for further evaluation.   Report was given to charge nurse Roselyn Reef. Pt verbalizes understanding and agrees to proceed.    Follow Up Instructions:    I discussed the assessment and treatment plan with the patient. The patient was provided an opportunity to ask questions and  all were answered. The patient agreed with the plan and demonstrated an understanding of the instructions.   The patient was advised to call back or seek an in-person evaluation if the symptoms worsen or if the condition fails to improve as anticipated.  Nance Pear, NP

## 2020-01-08 NOTE — ED Notes (Signed)
Pt aware urine and stool collection needed.

## 2020-01-08 NOTE — ED Notes (Signed)
Called [facility] for hospitalist consult, spoke with [facility]

## 2020-01-08 NOTE — ED Triage Notes (Signed)
N/V/D x 12 days. Denies fever, denies antibiotic use recently.

## 2020-01-08 NOTE — ED Provider Notes (Signed)
[facility] DE[facility]MENT Provider Note   CSN: [id number] Arrival date & time: 01/08/20  1730     History Chief Complaint  Patient presents with  . Diarrhea    [name] is a 69 y.o. female with PMH significant for IBS-D, abdominal plasty, and cholecystectomy who presents the ED with a 12-day history of nausea, vomiting, and diarrhea.  Patient endorses a 15 pound weight loss during period of time.  She states that she will have occasional, mild generalized abdominal cramping when she feels as though she needs to have a bowel movement, but it resolves with defecation.  She endorses weakness.  She states that she went to the ED on 12/30/2019, but given the extended wait times [facility].  She states that she has been unable to take any of her prescribed medications due to her nausea and emesis.  She states that the only solid foods that she has been tolerating has been approximately half a banana per day.  Patient also notes that she has had mild dysuria over the course the past 2 days which she feels to be concerning for UTI.  She reached out to her primary care provider, NP Conley Canal, who advised her to come to the ED for evaluation.  Patient denies any significant abdominal pain, fevers or chills, hematemesis, hematochezia, melena, chest pain or shortness of breath, obvious precipitating source of her infection, history of similar episodes, recent hematochezia.  HPI     Past Medical History:  Diagnosis Date  . Allergy    seasonal  . Arachnoiditis   . Depression   . Elevated liver enzymes   . Environmental allergies   . GERD (gastroesophageal reflux disease)   . H/O being hospitalized 03/2012   x1 week for nausea  . History of chicken pox   . Hypertension   . Migraine with typical aura    resolved years ago  . Nausea    chronic nausea     Patient Active Problem List   Diagnosis Date Noted  . Acute kidney injury ([place]) 01/08/2020  . Hypertension   .  Hypertriglyceridemia 06/15/2015  . Allergic rhinitis 10/16/2014  . GERD (gastroesophageal reflux disease) 10/16/2014  . Reactive airway disease 09/11/2014  . Chronic nausea 12/25/2013  . Anxiety and depression 12/25/2013  . Arachnoiditis   . Umbilical hernia 60/12/9321    Past Surgical History:  Procedure Laterality Date  . ABDOMINOPLASTY  07/11/09  . EUS N/A 05/05/2012   Procedure: UPPER ENDOSCOPIC ULTRASOUND (EUS) LINEAR;  Surgeon: Beryle Beams, MD;  Location: [facility] ENDOSCOPY;  Service: Endoscopy;  Laterality: N/A;  . LAPAROSCOPIC CHOLECYSTECTOMY  2010  . NASAL SINUS SURGERY     "Has had 2-3 surgeries over the years"     OB History    Gravida  1   Para  0   Term      Preterm      AB  1   Living        SAB      TAB  1   Ectopic      Multiple      Live Births              Family History  Problem Relation Age of Onset  . Alzheimer's disease Mother   . Hypertension Mother   . Migraines Mother   . Hypertension Father   . Other Father        ?carotid artery aneurysm  . Cancer Brother  mouth and throat    Social History   Tobacco Use  . Smoking status: Former Smoker    Packs/day: 0.25    Years: 8.00    Pack years: 2.00    Types: Cigarettes    Quit date: 08/09/1986    Years since quitting: 33.4  . Smokeless tobacco: Never Used  Substance Use Topics  . Alcohol use: No  . Drug use: No    Home Medications Prior to Admission medications   Medication Sig Start Date End Date Taking? Authorizing Provider  amLODipine (NORVASC) 5 MG tablet TAKE 1 TABLET BY MOUTH EVERY DAY.MAX ON INSURANCE 11/11/19  Yes [name], NP  amphetamine-dextroamphetamine (ADDERALL) 20 MG tablet Take 20 mg by mouth daily.   Yes [provider]  clonazePAM (KLONOPIN) 1 MG tablet TAKE 1 TABLET BY MOUTH THREE TIMES A DAY AS NEEDED FOR ANXIETY 10/20/16  Yes [name], NP  clotrimazole-betamethasone (LOTRISONE) cream Apply 1 application topically 2  (two) times daily. 01/16/19  Yes Saguier, Percell Miller, PA-C  mirtazapine (REMERON) 45 MG tablet Take 45 mg by mouth at bedtime.    Yes [provider]  PARoxetine (PAXIL) 40 MG tablet TAKE 1 TABLET BY MOUTH IN  THE MORNING 12/27/19  Yes [name], NP  Probiotic Product (PROBIOTIC DAILY PO) Take by mouth.   Yes [provider]  traZODone (DESYREL) 50 MG tablet    Yes [provider]  albuterol (VENTOLIN HFA) 108 (90 Base) MCG/ACT inhaler Inhale 2 puffs into the lungs every 6 (six) hours as needed for wheezing or shortness of breath. Patient not taking: Reported on 09/20/2019 06/27/19   Colon Branch, MD  [facility] EXTRA STRENGTH) 20 MG CAPS Take by mouth.    [provider]  azelastine (ASTELIN) 0.1 % nasal spray Place 2 sprays into both nostrils 2 (two) times daily. Use in each nostril as directed Patient not taking: Reported on 09/20/2019 07/20/19   Mannam, Hart Robinsons, MD  benzonatate (TESSALON) 100 MG capsule TAKE 1 CAPSULE BY MOUTH THREE TIMES A DAY AS NEEDED FOR COUGH Patient not taking: Reported on 08/22/2019 07/17/19   [name], NP  budesonide-formoterol [facility]) 160-4.5 MCG/ACT inhaler Inhale 2 puffs into the lungs 2 (two) times daily. Patient not taking: Reported on 09/20/2019 08/10/19   Marshell Garfinkel, MD  cetirizine (ZYRTEC) 10 MG tablet Take 1 tablet (10 mg total) by mouth daily. Patient not taking: Reported on 09/20/2019 06/08/19   [name], NP  fluticasone [facility]) 50 MCG/ACT nasal spray Place 2 sprays into both nostrils daily. Patient not taking: Reported on 09/20/2019 06/08/19   [name], NP  FOLIC ACID PO Take by mouth.    [provider]  MAGNESIUM PO Take by mouth.    [provider]  Multiple Vitamin (MULTIVITAMIN) capsule Take 1 capsule by mouth daily.    [provider]  Multiple Vitamins-Minerals (AIRBORNE PO) Take by mouth.    [provider]  omeprazole (PRILOSEC)  40 MG capsule Take 1 capsule (40 mg total) by mouth in the morning and at bedtime for 60 doses. Please cancel refills on old rx for omeprazole 40 mg once a day 11/11/19 12/11/19  [name], NP    Allergies    Compazine [prochlorperazine], Morphine and related, and Trintellix [vortioxetine]  Review of Systems   Review of Systems  All other systems reviewed and are negative.   Physical Exam Updated Vital Signs BP 117/60   Pulse 74   Temp 98.2 F (36.8 C) (Oral)  Resp 16   Ht 5\' 2"  (1.575 m)   Wt 65.3 kg   LMP 12/28/2011   SpO2 95%   BMI 26.34 kg/m   Physical Exam Vitals and nursing note reviewed. Exam conducted with a chaperone present.  HENT:     Head: Normocephalic and atraumatic.     Mouth/Throat:     Mouth: Mucous membranes are dry.  Eyes:     General: No scleral icterus.    Conjunctiva/sclera: Conjunctivae normal.  Cardiovascular:     Rate and Rhythm: Normal rate and regular rhythm.     Pulses: Normal pulses.     Heart sounds: Normal heart sounds.  Pulmonary:     Effort: Pulmonary effort is normal. No respiratory distress.     Breath sounds: Normal breath sounds. No wheezing or rales.  Abdominal:     General: Abdomen is flat. There is no distension.     Palpations: Abdomen is soft. There is no mass.     Tenderness: There is no abdominal tenderness. There is no guarding.     Comments: Soft, nondistended.  No areas of tenderness.  No guarding.  No overlying skin changes.  Musculoskeletal:     Right lower leg: No edema.     Left lower leg: No edema.  Skin:    General: Skin is dry.     Capillary Refill: Capillary refill takes less than 2 seconds.  Neurological:     Mental Status: She is alert and oriented to person, place, and time.     GCS: GCS eye subscore is 4. GCS verbal subscore is 5. GCS motor subscore is 6.  Psychiatric:        Mood and Affect: Mood normal.        Behavior: Behavior normal.        Thought Content: Thought content normal.      ED Results / Procedures / Treatments   Labs (all labs ordered are listed, but only abnormal results are displayed) Labs Reviewed  COMPREHENSIVE METABOLIC PANEL - Abnormal; Notable for the following components:      Result Value   Potassium 3.3 (*)    CO2 20 (*)    BUN 26 (*)    Creatinine, Ser 1.72 (*)    GFR, Estimated 30 (*)    All other components within normal limits  LIPASE, BLOOD - Abnormal; Notable for the following components:   Lipase 55 (*)    All other components within normal limits  CBC WITH DIFFERENTIAL/PLATELET - Abnormal; Notable for the following components:   RBC 5.13 (*)    Hemoglobin 15.4 (*)    Platelets 121 (*)    Monocytes Absolute 1.3 (*)    All other components within normal limits  URINALYSIS, ROUTINE W REFLEX MICROSCOPIC - Abnormal; Notable for the following components:   APPearance CLOUDY (*)    Bilirubin Urine SMALL (*)    Ketones, ur 40 (*)    Nitrite POSITIVE (*)    Leukocytes,Ua SMALL (*)    All other components within normal limits  URINALYSIS, MICROSCOPIC (REFLEX) - Abnormal; Notable for the following components:   Bacteria, UA MANY (*)    All other components within normal limits  GASTROINTESTINAL PANEL BY PCR, STOOL (REPLACES STOOL CULTURE)  C DIFFICILE QUICK SCREEN W PCR REFLEX  URINE CULTURE  RESPIRATORY PANEL BY RT PCR (FLU A&B, COVID)  MAGNESIUM    EKG None  Radiology No results found.  Procedures Procedures (including critical care time)  Medications Ordered in ED  Medications  sodium chloride 0.9 % bolus 1,000 mL (0 mLs Intravenous Stopped 01/08/20 1947)  ondansetron (ZOFRAN) injection 4 mg (4 mg Intravenous Given 01/08/20 1801)  cefTRIAXone (ROCEPHIN) 1 g in sodium chloride 0.9 % 100 mL IVPB (1 g Intravenous New Bag/Given 01/08/20 2057)  0.9 %  sodium chloride infusion ( Intravenous New Bag/Given 01/08/20 2101)  potassium chloride SA (KLOR-CON) CR tablet 20 mEq (20 mEq Oral Given 01/08/20 2127)  acetaminophen  (TYLENOL) tablet 650 mg (650 mg Oral Given 01/08/20 2127)    ED Course  I have reviewed the triage vital signs and the nursing notes.  Pertinent labs & imaging results that were available during my care of the patient were reviewed by me and considered in my medical decision making (see chart for details).    MDM Rules/Calculators/A&P                          Patient is very likely dehydrated given her inability to tolerate full liquid by mouth.  She has lost approximately 15 pounds due to her diminished p.o. intake and given her fatigue/weakness I am concerned for electrolyte derangement.  Plan to start an IV, administer 1 L IV and bolus and continuous infusion, Zofran 4 mg IV, and then reassess.  Labs CBC with differential: Unremarkable. CMP: Mild hypokalemia 3.3, relatively consistent with labs obtained 9 days ago.  However, she has creatinine of 1.72 and GFR reduced to 30, AKI when compared to labs obtained 9 days ago.  Likely prerenal azotemia.   UA: Nitrite positive with many bacteria and leukocytes, consistent with infection and her reports of dysuria. Urine culture: Pending. C. difficile quick screen: Needs to be collected. GI panel: Needs to be collected.  On subsequent evaluation, patient does feel improved after 1 L IV NS and 4 mg Zofran ODT.  However, given her significant AKI, inability to tolerate food or liquid by mouth x12 days, 15 pound weight loss, and UTI, feel as though she would benefit from admission for observation.  Her husband is at bedside and is concerned given her course of illness and inability to take her medications/eat or drink anything at home.  Do not feel as though any abdominal imaging is warranted.  No CVA tenderness or fevers reported otherwise concerning for pyelonephritis.  Will obtain COVID-19 screening and consult hospitalist for admission.  Spoke with Dr. Cyd Silence who will admit patient for AKI and dehydration in setting of intractable nausea,  vomiting, and diarrhea.    Final Clinical Impression(s) / ED Diagnoses Final diagnoses:  AKI (acute kidney injury) ([place])  Dehydration  Acute cystitis without hematuria    Rx / DC Orders ED Discharge Orders    None       Corena Herter, PA-C 01/08/20 2146    Lennice Sites, DO 01/08/20 2339

## 2020-01-09 ENCOUNTER — Encounter (HOSPITAL_COMMUNITY): Payer: Self-pay | Admitting: Internal Medicine

## 2020-01-09 ENCOUNTER — Ambulatory Visit: Payer: 59 | Admitting: Family

## 2020-01-09 DIAGNOSIS — K219 Gastro-esophageal reflux disease without esophagitis: Secondary | ICD-10-CM | POA: Diagnosis present

## 2020-01-09 DIAGNOSIS — R197 Diarrhea, unspecified: Secondary | ICD-10-CM

## 2020-01-09 DIAGNOSIS — D696 Thrombocytopenia, unspecified: Secondary | ICD-10-CM | POA: Diagnosis present

## 2020-01-09 DIAGNOSIS — Z820 Family history of epilepsy and other diseases of the nervous system: Secondary | ICD-10-CM | POA: Diagnosis not present

## 2020-01-09 DIAGNOSIS — I10 Essential (primary) hypertension: Secondary | ICD-10-CM | POA: Diagnosis present

## 2020-01-09 DIAGNOSIS — Z8249 Family history of ischemic heart disease and other diseases of the circulatory system: Secondary | ICD-10-CM | POA: Diagnosis not present

## 2020-01-09 DIAGNOSIS — F32A Depression, unspecified: Secondary | ICD-10-CM | POA: Diagnosis present

## 2020-01-09 DIAGNOSIS — N179 Acute kidney failure, unspecified: Secondary | ICD-10-CM | POA: Diagnosis present

## 2020-01-09 DIAGNOSIS — E876 Hypokalemia: Secondary | ICD-10-CM | POA: Diagnosis present

## 2020-01-09 DIAGNOSIS — N3000 Acute cystitis without hematuria: Secondary | ICD-10-CM | POA: Diagnosis present

## 2020-01-09 DIAGNOSIS — R112 Nausea with vomiting, unspecified: Secondary | ICD-10-CM

## 2020-01-09 DIAGNOSIS — Z885 Allergy status to narcotic agent status: Secondary | ICD-10-CM | POA: Diagnosis not present

## 2020-01-09 DIAGNOSIS — Z87891 Personal history of nicotine dependence: Secondary | ICD-10-CM | POA: Diagnosis not present

## 2020-01-09 DIAGNOSIS — K580 Irritable bowel syndrome with diarrhea: Secondary | ICD-10-CM | POA: Diagnosis present

## 2020-01-09 DIAGNOSIS — R8271 Bacteriuria: Secondary | ICD-10-CM | POA: Insufficient documentation

## 2020-01-09 DIAGNOSIS — F419 Anxiety disorder, unspecified: Secondary | ICD-10-CM | POA: Diagnosis present

## 2020-01-09 DIAGNOSIS — R1115 Cyclical vomiting syndrome unrelated to migraine: Secondary | ICD-10-CM | POA: Diagnosis present

## 2020-01-09 DIAGNOSIS — E860 Dehydration: Secondary | ICD-10-CM | POA: Diagnosis present

## 2020-01-09 DIAGNOSIS — I959 Hypotension, unspecified: Secondary | ICD-10-CM | POA: Diagnosis present

## 2020-01-09 DIAGNOSIS — Z20822 Contact with and (suspected) exposure to covid-19: Secondary | ICD-10-CM | POA: Diagnosis present

## 2020-01-09 DIAGNOSIS — B9620 Unspecified Escherichia coli [E. coli] as the cause of diseases classified elsewhere: Secondary | ICD-10-CM | POA: Diagnosis present

## 2020-01-09 HISTORY — DX: Acute kidney failure, unspecified: N17.9

## 2020-01-09 LAB — CBC WITH DIFFERENTIAL/PLATELET
Abs Immature Granulocytes: 0.02 10*3/uL (ref 0.00–0.07)
Basophils Absolute: 0.1 10*3/uL (ref 0.0–0.1)
Basophils Relative: 1 %
Eosinophils Absolute: 0.3 10*3/uL (ref 0.0–0.5)
Eosinophils Relative: 4 %
HCT: 39.2 % (ref 36.0–46.0)
Hemoglobin: 13 g/dL (ref 12.0–15.0)
Immature Granulocytes: 0 %
Lymphocytes Relative: 30 %
Lymphs Abs: 2.5 10*3/uL (ref 0.7–4.0)
MCH: 30.6 pg (ref 26.0–34.0)
MCHC: 33.2 g/dL (ref 30.0–36.0)
MCV: 92.2 fL (ref 80.0–100.0)
Monocytes Absolute: 1.1 10*3/uL — ABNORMAL HIGH (ref 0.1–1.0)
Monocytes Relative: 13 %
Neutro Abs: 4.4 10*3/uL (ref 1.7–7.7)
Neutrophils Relative %: 52 %
Platelets: 92 10*3/uL — ABNORMAL LOW (ref 150–400)
RBC: 4.25 MIL/uL (ref 3.87–5.11)
RDW: 13.2 % (ref 11.5–15.5)
WBC: 8.3 10*3/uL (ref 4.0–10.5)
nRBC: 0 % (ref 0.0–0.2)

## 2020-01-09 LAB — HEPATIC FUNCTION PANEL
ALT: 16 U/L (ref 0–44)
AST: 27 U/L (ref 15–41)
Albumin: 3.4 g/dL — ABNORMAL LOW (ref 3.5–5.0)
Alkaline Phosphatase: 68 U/L (ref 38–126)
Bilirubin, Direct: 0.1 mg/dL (ref 0.0–0.2)
Indirect Bilirubin: 0.4 mg/dL (ref 0.3–0.9)
Total Bilirubin: 0.5 mg/dL (ref 0.3–1.2)
Total Protein: 6 g/dL — ABNORMAL LOW (ref 6.5–8.1)

## 2020-01-09 LAB — BASIC METABOLIC PANEL
Anion gap: 9 (ref 5–15)
BUN: 23 mg/dL (ref 8–23)
CO2: 19 mmol/L — ABNORMAL LOW (ref 22–32)
Calcium: 8.4 mg/dL — ABNORMAL LOW (ref 8.9–10.3)
Chloride: 111 mmol/L (ref 98–111)
Creatinine, Ser: 1.28 mg/dL — ABNORMAL HIGH (ref 0.44–1.00)
GFR, Estimated: 43 mL/min — ABNORMAL LOW (ref >60)
Glucose, Bld: 79 mg/dL (ref 70–99)
Potassium: 3.8 mmol/L (ref 3.5–5.1)
Sodium: 139 mmol/L (ref 135–145)

## 2020-01-09 LAB — C DIFFICILE QUICK SCREEN W PCR REFLEX
C Diff antigen: NEGATIVE
C Diff interpretation: NOT DETECTED
C Diff toxin: NEGATIVE

## 2020-01-09 LAB — LACTATE DEHYDROGENASE: LDH: 120 U/L (ref 98–192)

## 2020-01-09 LAB — HIV ANTIBODY (ROUTINE TESTING W REFLEX): HIV Screen 4th Generation wRfx: NONREACTIVE

## 2020-01-09 LAB — PATHOLOGIST SMEAR REVIEW

## 2020-01-09 LAB — MAGNESIUM: Magnesium: 2 mg/dL (ref 1.7–2.4)

## 2020-01-09 MED ORDER — CLONAZEPAM 1 MG PO TABS
1.0000 mg | ORAL_TABLET | Freq: Three times a day (TID) | ORAL | Status: DC | PRN
  Administered 2020-01-09 – 2020-01-10 (×2): 1 mg via ORAL
  Filled 2020-01-09 (×2): qty 1

## 2020-01-09 MED ORDER — ACETAMINOPHEN 325 MG PO TABS: 650.0000 mg | ORAL_TABLET | Freq: Four times a day (QID) | ORAL | Status: DC | PRN

## 2020-01-09 MED ORDER — ENOXAPARIN SODIUM 30 MG/0.3ML ~~LOC~~ SOLN
30.0000 mg | SUBCUTANEOUS | Status: DC
  Administered 2020-01-09 – 2020-01-10 (×2): 30 mg via SUBCUTANEOUS
  Filled 2020-01-09 (×2): qty 0.3

## 2020-01-09 MED ORDER — SODIUM CHLORIDE 0.9 % IV SOLN: 1.0000 g | INTRAVENOUS | Status: DC

## 2020-01-09 MED ORDER — SODIUM CHLORIDE 0.9 % IV SOLN: INTRAVENOUS | Status: AC

## 2020-01-09 MED ORDER — MIRTAZAPINE 15 MG PO TABS
45.0000 mg | ORAL_TABLET | Freq: Every day | ORAL | Status: DC
  Administered 2020-01-09: 45 mg via ORAL
  Filled 2020-01-09: qty 3

## 2020-01-09 MED ORDER — ACETAMINOPHEN 650 MG RE SUPP: 650.0000 mg | Freq: Four times a day (QID) | RECTAL | Status: DC | PRN

## 2020-01-09 MED ORDER — TRAZODONE HCL 50 MG PO TABS
50.0000 mg | ORAL_TABLET | Freq: Every evening | ORAL | Status: DC | PRN
  Administered 2020-01-09: 150 mg via ORAL
  Administered 2020-01-09: 50 mg via ORAL
  Filled 2020-01-09: qty 1
  Filled 2020-01-09: qty 3

## 2020-01-09 MED ORDER — AMPHETAMINE-DEXTROAMPHETAMINE 20 MG PO TABS
20.0000 mg | ORAL_TABLET | Freq: Every day | ORAL | Status: DC
  Administered 2020-01-09: 20 mg via ORAL
  Filled 2020-01-09: qty 1

## 2020-01-09 MED ORDER — PAROXETINE HCL 20 MG PO TABS
40.0000 mg | ORAL_TABLET | Freq: Every day | ORAL | Status: DC
  Administered 2020-01-09 – 2020-01-10 (×2): 40 mg via ORAL
  Filled 2020-01-09 (×2): qty 2

## 2020-01-09 NOTE — Assessment & Plan Note (Addendum)
-   etiology still unclear; infection still remains on differential, more than likely viral as well - husband with similar diarrheal course but healed fast; patient did have covid-19 booster about 4 days prior to onset of sxms (possible immunosuppression predisposing to current infection) - Cdiff negative; GI pathogen panel negative too - her diarrhea and N/V did slowly improve after IVF; she tolerated a diet prior to discharge

## 2020-01-09 NOTE — Hospital Course (Addendum)
Ms. Doby is a 69 yo CF with PMH HTN, depression, anxiety, GERD who initially presented to [facility] with unresolving N/V/D for approximately 2 weeks.  Her husband had a brief few days of diarrhea approximately 5-7 days prior to the patient developing diarrhea. Of note, patient also had her Covid-19 booster shot approximately 4 days prior to onset of her symptoms as well.  Initially her UA was suggestive of infection given positive nitrites and small LE however she was minimally symptomatic and she was treated with Rocephin initially, which was not continued on transfer to [facility].  Stool studies were ordered for testing. C diff was negative.  Due to her GI losses, she also had an AKI on workup and was started on IVF.  Her renal function recovered after ongoing fluids. Her remaining stool studies returned negative including GI pathogen panel and C. difficile as noted above.  Her symptoms did slowly improve and she was able to tolerate a diet prior to discharge. Given her negative diarrhea workup but remaining subtle UTI symptoms she was resumed on abx for treating UTI; she was started on cefdinir and this was continued on discharge. Ucx grew pan-sensitive E. Coli.

## 2020-01-09 NOTE — Progress Notes (Signed)
Received patient from [facility]. In route patient had some ectopy. Paged MD on call to update and to notify about patient's arrival.   Updated Orders to follow

## 2020-01-09 NOTE — H&P (Signed)
History and Physical    [name] MOQ:[id number] DOB: [birth year] DOA: 01/08/2020  PCP: [name], NP  Patient coming from: Home.  Chief Complaint: Vomiting and diarrhea.  HPI: [name] is a 69 y.o. female with history of hypertension, depression anxiety and GERD presents to the ER at [facility] with intractable nausea vomiting and diarrhea ongoing for last 10 days.  Denies any abdominal pain fever chills denies any recent travel sick contacts or antibiotic use.  Patient states the diarrhea is watery and at times as 15 episodes a day.  ED Course: In the ER patient was mildly hypotensive improving with fluids.  Labs are significant for worsening creatinine when compared to recent about 10 days ago.  Platelets are around 121.  Urine was concerning for UTI and was started on ceftriaxone.  Patient was given fluids admitted for further management of acute renal failure with intractable vomiting and diarrhea.  Review of Systems: As per HPI, rest all negative.   Past Medical History:  Diagnosis Date  . Allergy    seasonal  . Arachnoiditis   . Depression   . Elevated liver enzymes   . Environmental allergies   . GERD (gastroesophageal reflux disease)   . H/O being hospitalized 03/2012   x1 week for nausea  . History of chicken pox   . Hypertension   . Migraine with typical aura    resolved years ago  . Nausea    chronic nausea     Past Surgical History:  Procedure Laterality Date  . ABDOMINOPLASTY  07/11/09  . EUS N/A 05/05/2012   Procedure: UPPER ENDOSCOPIC ULTRASOUND (EUS) LINEAR;  Surgeon: Beryle Beams, MD;  Location: [facility];  Service: Endoscopy;  Laterality: N/A;  . LAPAROSCOPIC CHOLECYSTECTOMY  2010  . NASAL SINUS SURGERY     "Has had 2-3 surgeries over the years"     reports that she quit smoking about 33 years ago. Her smoking use included cigarettes. She has a 2.00 pack-year smoking history. She has never used smokeless tobacco. She  reports that she does not drink alcohol and does not use drugs.  Allergies  Allergen Reactions  . Compazine [Prochlorperazine]     hyperactivity  . Morphine And Related Hives and Itching  . Trintellix [Vortioxetine] Nausea And Vomiting    Family History  Problem Relation Age of Onset  . Alzheimer's disease Mother   . Hypertension Mother   . Migraines Mother   . Hypertension Father   . Other Father        ?carotid artery aneurysm  . Cancer Brother        mouth and throat    Prior to Admission medications   Medication Sig Start Date End Date Taking? Authorizing Provider  amLODipine (NORVASC) 5 MG tablet TAKE 1 TABLET BY MOUTH EVERY DAY.MAX ON INSURANCE Patient taking differently: Take 5 mg by mouth daily.  11/11/19  Yes [name], NP  amphetamine-dextroamphetamine (ADDERALL) 20 MG tablet Take 20 mg by mouth daily.   Yes [provider]  clonazePAM (KLONOPIN) 1 MG tablet TAKE 1 TABLET BY MOUTH THREE TIMES A DAY AS NEEDED FOR ANXIETY Patient taking differently: Take 1 mg by mouth 3 (three) times daily as needed for anxiety.  10/20/16  Yes [name], NP  clotrimazole-betamethasone (LOTRISONE) cream Apply 1 application topically 2 (two) times daily. 01/16/19  Yes Saguier, Percell Miller, PA-C  mirtazapine (REMERON) 45 MG tablet Take 45 mg by mouth at bedtime.    Yes [provider]  omeprazole (PRILOSEC) 40 MG capsule Take 1 capsule (40 mg total) by mouth in the morning and at bedtime for 60 doses. Please cancel refills on old rx for omeprazole 40 mg once a day 11/11/19 01/09/20 Yes [name], NP  PARoxetine (PAXIL) 40 MG tablet TAKE 1 TABLET BY MOUTH IN  THE MORNING Patient taking differently: Take 40 mg by mouth in the morning.  12/27/19  Yes [name], NP  Probiotic Product (PROBIOTIC DAILY PO) Take 1 capsule by mouth daily.    Yes [provider]  traZODone (DESYREL) 50 MG tablet Take 50-150 mg by mouth at bedtime as needed for  sleep.    Yes [provider]  albuterol (VENTOLIN HFA) 108 (90 Base) MCG/ACT inhaler Inhale 2 puffs into the lungs every 6 (six) hours as needed for wheezing or shortness of breath. Patient not taking: Reported on 09/20/2019 06/27/19   Colon Branch, MD  azelastine (ASTELIN) 0.1 % nasal spray Place 2 sprays into both nostrils 2 (two) times daily. Use in each nostril as directed Patient not taking: Reported on 09/20/2019 07/20/19   Mannam, Hart Robinsons, MD  benzonatate (TESSALON) 100 MG capsule TAKE 1 CAPSULE BY MOUTH THREE TIMES A DAY AS NEEDED FOR COUGH Patient not taking: Reported on 08/22/2019 07/17/19   [name], NP  budesonide-formoterol [facility]) 160-4.5 MCG/ACT inhaler Inhale 2 puffs into the lungs 2 (two) times daily. Patient not taking: Reported on 09/20/2019 08/10/19   Marshell Garfinkel, MD  cetirizine (ZYRTEC) 10 MG tablet Take 1 tablet (10 mg total) by mouth daily. Patient not taking: Reported on 09/20/2019 06/08/19   [name], NP  fluticasone [facility]) 50 MCG/ACT nasal spray Place 2 sprays into both nostrils daily. Patient not taking: Reported on 09/20/2019 06/08/19   [name], NP  FOLIC ACID PO Take by mouth.    [provider]  MAGNESIUM PO Take by mouth.    [provider]  Multiple Vitamin (MULTIVITAMIN) capsule Take 1 capsule by mouth daily.    [provider]  Multiple Vitamins-Minerals (AIRBORNE PO) Take by mouth.    [provider]    Physical Exam: Constitutional: Moderately built and nourished. Vitals:   01/08/20 2015 01/08/20 2300 01/09/20 0015 01/09/20 0127  BP: 117/60 112/71 (!) 124/53 (!) 82/69  Pulse: 74 78 68 65  Resp: 16 16  16   Temp:    98 F (36.7 C)  TempSrc:    Oral  SpO2: 95% 96% 91% 96%  Weight:      Height:       Eyes: Anicteric no pallor. ENMT: No discharge from the ears eyes nose or mouth. Neck: No mass felt.  No neck rigidity. Respiratory: No rhonchi or  crepitations. Cardiovascular: S1-S2 heard. Abdomen: Soft nontender bowel sounds present. Musculoskeletal: No edema. Skin: No rash. Neurologic: Alert awake oriented to time place and person.  Moves all extremities. Psychiatric: Appears normal.  Normal affect.   Labs on Admission: I have personally reviewed following labs and imaging studies  CBC: Recent Labs  Lab 01/08/20 1756  WBC 10.4  NEUTROABS 5.9  HGB 15.4*  HCT 44.5  MCV 86.7  PLT 536*   Basic Metabolic Panel: Recent Labs  Lab 01/08/20 1756 01/08/20 2130  NA 137  --   K 3.3*  --   CL 102  --   CO2 20*  --   GLUCOSE 83  --   BUN 26*  --   CREATININE 1.72*  --   CALCIUM 9.5  --  MG  --  1.8   GFR: Estimated Creatinine Clearance: 27.4 mL/min (A) (by C-G formula based on SCr of 1.72 mg/dL (H)). Liver Function Tests: Recent Labs  Lab 01/08/20 1756  AST 29  ALT 21  ALKPHOS 83  BILITOT 0.7  PROT 7.5  ALBUMIN 4.3   Recent Labs  Lab 01/08/20 1756  LIPASE 55*   No results for input(s): AMMONIA in the last 168 hours. Coagulation Profile: No results for input(s): INR, PROTIME in the last 168 hours. Cardiac Enzymes: No results for input(s): CKTOTAL, CKMB, CKMBINDEX, TROPONINI in the last 168 hours. BNP (last 3 results) No results for input(s): PROBNP in the last 8760 hours. HbA1C: No results for input(s): HGBA1C in the last 72 hours. CBG: No results for input(s): GLUCAP in the last 168 hours. Lipid Profile: No results for input(s): CHOL, HDL, LDLCALC, TRIG, CHOLHDL, LDLDIRECT in the last 72 hours. Thyroid Function Tests: No results for input(s): TSH, T4TOTAL, FREET4, T3FREE, THYROIDAB in the last 72 hours. Anemia Panel: No results for input(s): VITAMINB12, FOLATE, FERRITIN, TIBC, IRON, RETICCTPCT in the last 72 hours. Urine analysis:    Component Value Date/Time   COLORURINE YELLOW 01/08/2020 1756   APPEARANCEUR CLOUDY (A) 01/08/2020 1756   LABSPEC 1.025 01/08/2020 1756   PHURINE 5.5 01/08/2020  1756   GLUCOSEU NEGATIVE 01/08/2020 1756   HGBUR NEGATIVE 01/08/2020 1756   BILIRUBINUR SMALL (A) 01/08/2020 1756   BILIRUBINUR - 11/15/2012 1329   KETONESUR 40 (A) 01/08/2020 1756   PROTEINUR NEGATIVE 01/08/2020 1756   UROBILINOGEN negative 11/15/2012 1329   UROBILINOGEN 0.2 11/19/2008 1635   NITRITE POSITIVE (A) 01/08/2020 1756   LEUKOCYTESUR SMALL (A) 01/08/2020 1756   Sepsis Labs: @LABRCNTIP (procalcitonin:4,lacticidven:4) ) Recent Results (from the past 240 hour(s))  Respiratory Panel by RT PCR (Flu A&B, Covid) - Nasopharyngeal Swab     Status: None   Collection Time: 01/08/20  9:21 PM   Specimen: Nasopharyngeal Swab  Result Value Ref Range Status   SARS Coronavirus 2 by RT PCR NEGATIVE NEGATIVE Final    Comment: (NOTE) SARS-CoV-2 target nucleic acids are NOT DETECTED.  The SARS-CoV-2 RNA is generally detectable in upper respiratoy specimens during the acute phase of infection. The lowest concentration of SARS-CoV-2 viral copies this assay can detect is 131 copies/mL. A negative result does not preclude SARS-Cov-2 infection and should not be used as the sole basis for treatment or other patient management decisions. A negative result may occur with  improper specimen collection/handling, submission of specimen other than nasopharyngeal swab, presence of viral mutation(s) within the areas targeted by this assay, and inadequate number of viral copies (<131 copies/mL). A negative result must be combined with clinical observations, patient history, and epidemiological information. The expected result is Negative.  Fact Sheet for Patients:  PinkCheek.be  Fact Sheet for Healthcare Providers:  GravelBags.it  This test is no t yet approved or cleared by the [place] FDA and  has been authorized for detection and/or diagnosis of SARS-CoV-2 by FDA under an Emergency Use Authorization (EUA). This EUA will remain  in  effect (meaning this test can be used) for the duration of the COVID-19 declaration under Section 564(b)(1) of the Act, 21 U.S.C. section 360bbb-3(b)(1), unless the authorization is terminated or revoked sooner.     Influenza A by PCR NEGATIVE NEGATIVE Final   Influenza B by PCR NEGATIVE NEGATIVE Final    Comment: (NOTE) The Xpert Xpress SARS-CoV-2/FLU/RSV assay is intended as an aid in  the diagnosis of influenza from  Nasopharyngeal swab specimens and  should not be used as a sole basis for treatment. Nasal washings and  aspirates are unacceptable for Xpert Xpress SARS-CoV-2/FLU/RSV  testing.  Fact Sheet for Patients: PinkCheek.be  Fact Sheet for Healthcare Providers: GravelBags.it  This test is not yet approved or cleared by the [place] FDA and  has been authorized for detection and/or diagnosis of SARS-CoV-2 by  FDA under an Emergency Use Authorization (EUA). This EUA will remain  in effect (meaning this test can be used) for the duration of the  Covid-19 declaration under Section 564(b)(1) of the Act, 21  U.S.C. section 360bbb-3(b)(1), unless the authorization is  terminated or revoked. Performed at [facility], [address] [place], [place] [address]      Radiological Exams on Admission: No results found.   Assessment/Plan Principal Problem:   Acute kidney injury ([place]) Active Problems:   Hypertension   Acute cystitis without hematuria   Nausea vomiting and diarrhea   ARF (acute renal failure) ([employer])    1. Intractable nausea vomiting and diarrhea -patient states the diarrhea is more than the vomiting.  Will check stool studies including GI pathogen panel and C. difficile.  May need CAT scan of the abdomen pelvis if diarrhea continues.  Check lactic acid levels.  Continue to hydrate. 2. Possible UTI on ceftriaxone. 3. Thrombocytopenia -could be from infectious process.  Patient is afebrile  at this time.  But does have renal failure.  Will check peripheral smear for any schistocytes, check LDH and closely follow CBC for any further worsening. 4. Acute renal failure likely from dehydration from vomiting and diarrhea.  Note that patient also has thrombocytopenia.  Continue to hydrate and follow metabolic panel. 5. Hypertension holding antihypertensives due to low blood pressure. 6. History of depression and anxiety was unable to take her medication due to persistent vomiting and diarrhea.  We will continue if patient can tolerate orally.   DVT prophylaxis: Lovenox.  If patient's platelets continue to further decrease may have to hold it. Code Status: Full code. Family Communication: Discussed with patient. Disposition Plan: Home. Consults called: None. Admission status: Observation.   Rise Patience MD [facility] Pager [phone].  If 7PM-7AM, please contact night-coverage [email or web address] Password [email or web address]  01/09/2020, 1:46 AM

## 2020-01-09 NOTE — Assessment & Plan Note (Addendum)
-   patient unable to keep up with oral intake alone in setting of severe GI losses from diarrhea - resolved with IVF

## 2020-01-09 NOTE — Assessment & Plan Note (Signed)
-   UA noted however clinically is asymptomatic; will d/c CTX; s/p 1 dose at [facility]

## 2020-01-09 NOTE — ED Notes (Signed)
[facility] notified regarding ready bed.

## 2020-01-09 NOTE — Assessment & Plan Note (Addendum)
-   still suspected due to infection - repeat CBC next week; patient understands to follow up outpt for repeat bloodwork

## 2020-01-09 NOTE — Plan of Care (Signed)
  Problem: Coping: Goal: Level of anxiety will decrease Outcome: Progressing   Problem: Clinical Measurements: Goal: Will remain free from infection Outcome: Progressing    Problem: Health Behavior/Discharge Planning: Goal: Ability to manage health-related needs will improve Outcome: Progressing

## 2020-01-09 NOTE — Telephone Encounter (Signed)
Patient had virtual visit yesterday

## 2020-01-09 NOTE — Progress Notes (Signed)
PROGRESS NOTE    Kashlynn Kundert   MOQ:[id number]  DOB: 07/24/1950  DOA: 01/08/2020     0  PCP: [name], NP  CC: N/V/D  Hospital Course: Ms. Dewalt is a 69 yo CF with PMH HTN, depression, anxiety, GERD who initially presented to [facility] with unresolving N/V/D for approximately 2 weeks.  Her husband had a brief few days of diarrhea approximately 5-7 days prior to the patient developing diarrhea. Of note, patient also had her Covid-19 booster shot approximately 4 days prior to onset of her symptoms as well.  Initially her UA was suggestive of infection given positive nitrites and small LE however she was asymptomatic; she was treated with Rocephin initially, which was not continued on transfer to [facility].  Stool studies were ordered for testing. C diff was negative.  Due to her GI losses, she also had an AKI on workup and was started on IVF.    Interval History:  Patient seen in her room this morning with husband bedside.  Had just given a stool sample which was brown/green and very liquid in nature.  Still having mild abdominal cramping.  Nausea/vomiting seems to be improving overall, just lingering diarrhea which has not improved in approximately 2 weeks she says.  Old records reviewed in assessment of this patient  ROS: Constitutional: negative for chills and fevers, Respiratory: negative for cough, Cardiovascular: negative for chest pain and Gastrointestinal: positive for change in bowel habits and diarrhea  Assessment & Plan: * Acute kidney injury ([place]) - patient unable to keep up with oral intake alone in setting of severe GI losses from diarrhea - continue treating with IVF and monitor diarrhea losses - renal function still abnormal but improving with IVF  Nausea vomiting and diarrhea - etiology still unclear; infection still remains on differential, more than likely viral as well - husband with similar diarrheal course but healed fast; patient did have covid-19 booster about  4 days prior to onset of sxms (possible immunosuppression predisposing to current infection) - thrombocytopenia also felt to be due to acute illness at this time but will keep trending and workup further if needed - Cdiff negative - follow up GI pathogen panel   Thrombocytopenia ([place]) - see N/V/D - CBC in am  Asymptomatic bacteriuria-resolved as of 01/09/2020 - UA noted however clinically is asymptomatic; will d/c CTX; s/p 1 dose at [facility]    Antimicrobials: Rocephin 01/08/2020 x 1  DVT prophylaxis: Lovenox Code Status: Full Family Communication: Husband bedside Disposition Plan: Status is: Inpatient  Remains inpatient appropriate because:Persistent severe electrolyte disturbances, Unsafe d/c plan, IV treatments appropriate due to intensity of illness or inability to take PO and Inpatient level of care appropriate due to severity of illness   Dispo: The patient is from: Home              Anticipated d/c is to: Home              Anticipated d/c date is: 2 days              Patient currently is not medically stable to d/c.       Objective: Blood pressure (!) 118/57, pulse 78, temperature (!) 97.5 F (36.4 C), temperature source Oral, resp. rate 18, height 5\' 2"  (1.575 m), weight 65.3 kg, last menstrual period 12/28/2011, SpO2 95 %.  Examination: General appearance: alert, cooperative and no distress Head: Normocephalic, without obvious abnormality, atraumatic Eyes: EOMI Lungs: clear to auscultation bilaterally Heart: regular rate and rhythm and  S1, S2 normal Abdomen: mild nonspecific TTP; BS present; no R/G Extremities: no edema Skin: mobility and turgor normal and no edema Neurologic: Grossly normal  Consultants:   none  Procedures:   none  Data Reviewed: I have personally reviewed following labs and imaging studies Results for orders placed or performed during the hospital encounter of 01/08/20 (from the past 24 hour(s))  Comprehensive metabolic panel      Status: Abnormal   Collection Time: 01/08/20  5:56 PM  Result Value Ref Range   Sodium 137 135 - 145 mmol/L   Potassium 3.3 (L) 3.5 - 5.1 mmol/L   Chloride 102 98 - 111 mmol/L   CO2 20 (L) 22 - 32 mmol/L   Glucose, Bld 83 70 - 99 mg/dL   BUN 26 (H) 8 - 23 mg/dL   Creatinine, Ser 1.72 (H) 0.44 - 1.00 mg/dL   Calcium 9.5 8.9 - 10.3 mg/dL   Total Protein 7.5 6.5 - 8.1 g/dL   Albumin 4.3 3.5 - 5.0 g/dL   AST 29 15 - 41 U/L   ALT 21 0 - 44 U/L   Alkaline Phosphatase 83 38 - 126 U/L   Total Bilirubin 0.7 0.3 - 1.2 mg/dL   GFR, Estimated 30 (L) >60 mL/min   Anion gap 15 5 - 15  Lipase, blood     Status: Abnormal   Collection Time: 01/08/20  5:56 PM  Result Value Ref Range   Lipase 55 (H) 11 - 51 U/L  CBC with Differential     Status: Abnormal   Collection Time: 01/08/20  5:56 PM  Result Value Ref Range   WBC 10.4 4.0 - 10.5 K/uL   RBC 5.13 (H) 3.87 - 5.11 MIL/uL   Hemoglobin 15.4 (H) 12.0 - 15.0 g/dL   HCT 44.5 36 - 46 %   MCV 86.7 80.0 - 100.0 fL   MCH 30.0 26.0 - 34.0 pg   MCHC 34.6 30.0 - 36.0 g/dL   RDW 13.2 11.5 - 15.5 %   Platelets 121 (L) 150 - 400 K/uL   nRBC 0.0 0.0 - 0.2 %   Neutrophils Relative % 56 %   Neutro Abs 5.9 1.7 - 7.7 K/uL   Lymphocytes Relative 26 %   Lymphs Abs 2.7 0.7 - 4.0 K/uL   Monocytes Relative 13 %   Monocytes Absolute 1.3 (H) 0.1 - 1.0 K/uL   Eosinophils Relative 3 %   Eosinophils Absolute 0.3 0.0 - 0.5 K/uL   Basophils Relative 1 %   Basophils Absolute 0.1 0.0 - 0.1 K/uL   Smear Review HIDE    Immature Granulocytes 1 %   Abs Immature Granulocytes 0.05 0.00 - 0.07 K/uL  Urinalysis, Routine w reflex microscopic     Status: Abnormal   Collection Time: 01/08/20  5:56 PM  Result Value Ref Range   Color, Urine YELLOW YELLOW   APPearance CLOUDY (A) CLEAR   Specific Gravity, Urine 1.025 1.005 - 1.030   pH 5.5 5.0 - 8.0   Glucose, UA NEGATIVE NEGATIVE mg/dL   Hgb urine dipstick NEGATIVE NEGATIVE   Bilirubin Urine SMALL (A) NEGATIVE   Ketones,  ur 40 (A) NEGATIVE mg/dL   Protein, ur NEGATIVE NEGATIVE mg/dL   Nitrite POSITIVE (A) NEGATIVE   Leukocytes,Ua SMALL (A) NEGATIVE  Urinalysis, Microscopic (reflex)     Status: Abnormal   Collection Time: 01/08/20  5:56 PM  Result Value Ref Range   RBC / HPF 0-5 0 - 5 RBC/hpf   WBC, UA  11-20 0 - 5 WBC/hpf   Bacteria, UA MANY (A) NONE SEEN   Squamous Epithelial / LPF 6-10 0 - 5  Respiratory Panel by RT PCR (Flu A&B, Covid) - Nasopharyngeal Swab     Status: None   Collection Time: 01/08/20  9:21 PM   Specimen: Nasopharyngeal Swab  Result Value Ref Range   SARS Coronavirus 2 by RT PCR NEGATIVE NEGATIVE   Influenza A by PCR NEGATIVE NEGATIVE   Influenza B by PCR NEGATIVE NEGATIVE  Magnesium     Status: None   Collection Time: 01/08/20  9:30 PM  Result Value Ref Range   Magnesium 1.8 1.7 - 2.4 mg/dL  HIV Antibody (routine testing w rflx)     Status: None   Collection Time: 01/09/20  2:06 AM  Result Value Ref Range   HIV Screen 4th Generation wRfx Non Reactive Non Reactive  Basic metabolic panel     Status: Abnormal   Collection Time: 01/09/20  2:06 AM  Result Value Ref Range   Sodium 139 135 - 145 mmol/L   Potassium 3.8 3.5 - 5.1 mmol/L   Chloride 111 98 - 111 mmol/L   CO2 19 (L) 22 - 32 mmol/L   Glucose, Bld 79 70 - 99 mg/dL   BUN 23 8 - 23 mg/dL   Creatinine, Ser 1.28 (H) 0.44 - 1.00 mg/dL   Calcium 8.4 (L) 8.9 - 10.3 mg/dL   GFR, Estimated 43 (L) >60 mL/min   Anion gap 9 5 - 15  Hepatic function panel     Status: Abnormal   Collection Time: 01/09/20  2:06 AM  Result Value Ref Range   Total Protein 6.0 (L) 6.5 - 8.1 g/dL   Albumin 3.4 (L) 3.5 - 5.0 g/dL   AST 27 15 - 41 U/L   ALT 16 0 - 44 U/L   Alkaline Phosphatase 68 38 - 126 U/L   Total Bilirubin 0.5 0.3 - 1.2 mg/dL   Bilirubin, Direct 0.1 0.0 - 0.2 mg/dL   Indirect Bilirubin 0.4 0.3 - 0.9 mg/dL  Magnesium     Status: None   Collection Time: 01/09/20  2:06 AM  Result Value Ref Range   Magnesium 2.0 1.7 - 2.4 mg/dL   CBC WITH DIFFERENTIAL     Status: Abnormal   Collection Time: 01/09/20  2:06 AM  Result Value Ref Range   WBC 8.3 4.0 - 10.5 K/uL   RBC 4.25 3.87 - 5.11 MIL/uL   Hemoglobin 13.0 12.0 - 15.0 g/dL   HCT 39.2 36 - 46 %   MCV 92.2 80.0 - 100.0 fL   MCH 30.6 26.0 - 34.0 pg   MCHC 33.2 30.0 - 36.0 g/dL   RDW 13.2 11.5 - 15.5 %   Platelets 92 (L) 150 - 400 K/uL   nRBC 0.0 0.0 - 0.2 %   Neutrophils Relative % 52 %   Neutro Abs 4.4 1.7 - 7.7 K/uL   Lymphocytes Relative 30 %   Lymphs Abs 2.5 0.7 - 4.0 K/uL   Monocytes Relative 13 %   Monocytes Absolute 1.1 (H) 0.1 - 1.0 K/uL   Eosinophils Relative 4 %   Eosinophils Absolute 0.3 0.0 - 0.5 K/uL   Basophils Relative 1 %   Basophils Absolute 0.1 0.0 - 0.1 K/uL   Immature Granulocytes 0 %   Abs Immature Granulocytes 0.02 0.00 - 0.07 K/uL  Lactate dehydrogenase     Status: None   Collection Time: 01/09/20  6:50 AM  Result Value  Ref Range   LDH 120 98 - 192 U/L  Pathologist smear review     Status: None   Collection Time: 01/09/20  6:50 AM  Result Value Ref Range   Path Review Reviewed By Violet Baldy, M.D.   C Difficile Quick Screen w PCR reflex     Status: None   Collection Time: 01/09/20 11:59 AM  Result Value Ref Range   C Diff antigen NEGATIVE NEGATIVE   C Diff toxin NEGATIVE NEGATIVE   C Diff interpretation No C. difficile detected.     Recent Results (from the past 240 hour(s))  Respiratory Panel by RT PCR (Flu A&B, Covid) - Nasopharyngeal Swab     Status: None   Collection Time: 01/08/20  9:21 PM   Specimen: Nasopharyngeal Swab  Result Value Ref Range Status   SARS Coronavirus 2 by RT PCR NEGATIVE NEGATIVE Final    Comment: (NOTE) SARS-CoV-2 target nucleic acids are NOT DETECTED.  The SARS-CoV-2 RNA is generally detectable in upper respiratoy specimens during the acute phase of infection. The lowest concentration of SARS-CoV-2 viral copies this assay can detect is 131 copies/mL. A negative result does not preclude  SARS-Cov-2 infection and should not be used as the sole basis for treatment or other patient management decisions. A negative result may occur with  improper specimen collection/handling, submission of specimen other than nasopharyngeal swab, presence of viral mutation(s) within the areas targeted by this assay, and inadequate number of viral copies (<131 copies/mL). A negative result must be combined with clinical observations, patient history, and epidemiological information. The expected result is Negative.  Fact Sheet for Patients:  PinkCheek.be  Fact Sheet for Healthcare Providers:  GravelBags.it  This test is no t yet approved or cleared by the [place] FDA and  has been authorized for detection and/or diagnosis of SARS-CoV-2 by FDA under an Emergency Use Authorization (EUA). This EUA will remain  in effect (meaning this test can be used) for the duration of the COVID-19 declaration under Section 564(b)(1) of the Act, 21 U.S.C. section 360bbb-3(b)(1), unless the authorization is terminated or revoked sooner.     Influenza A by PCR NEGATIVE NEGATIVE Final   Influenza B by PCR NEGATIVE NEGATIVE Final    Comment: (NOTE) The Xpert Xpress SARS-CoV-2/FLU/RSV assay is intended as an aid in  the diagnosis of influenza from Nasopharyngeal swab specimens and  should not be used as a sole basis for treatment. Nasal washings and  aspirates are unacceptable for Xpert Xpress SARS-CoV-2/FLU/RSV  testing.  Fact Sheet for Patients: PinkCheek.be  Fact Sheet for Healthcare Providers: GravelBags.it  This test is not yet approved or cleared by the [place] FDA and  has been authorized for detection and/or diagnosis of SARS-CoV-2 by  FDA under an Emergency Use Authorization (EUA). This EUA will remain  in effect (meaning this test can be used) for the duration of the   Covid-19 declaration under Section 564(b)(1) of the Act, 21  U.S.C. section 360bbb-3(b)(1), unless the authorization is  terminated or revoked. Performed at [facility], [address] [place], [place] [address]   C Difficile Quick Screen w PCR reflex     Status: None   Collection Time: 01/09/20 11:59 AM  Result Value Ref Range Status   C Diff antigen NEGATIVE NEGATIVE Final   C Diff toxin NEGATIVE NEGATIVE Final   C Diff interpretation No C. difficile detected.  Final    Comment: Performed at [facility], [address] [place], [place] [address]     Radiology Studies: No results found. No orders to display    Scheduled Meds: . amphetamine-dextroamphetamine  20 mg Oral Daily  . enoxaparin (LOVENOX) injection  30 mg Subcutaneous Q24H  . mirtazapine  45 mg Oral QHS  . PARoxetine  40 mg Oral Daily   PRN Meds: acetaminophen **OR** acetaminophen, clonazePAM, traZODone Continuous Infusions: . sodium chloride 125 mL/hr at 01/09/20 0400      LOS: 0 days  Time spent: Greater than 50% of the 35 minute visit was spent in counseling/coordination of care for the patient as laid out in the A&P.   Dwyane Dee, MD [facility] 01/09/2020, 2:46 PM

## 2020-01-10 DIAGNOSIS — N3000 Acute cystitis without hematuria: Secondary | ICD-10-CM | POA: Diagnosis not present

## 2020-01-10 DIAGNOSIS — N179 Acute kidney failure, unspecified: Secondary | ICD-10-CM | POA: Diagnosis not present

## 2020-01-10 DIAGNOSIS — D696 Thrombocytopenia, unspecified: Secondary | ICD-10-CM | POA: Diagnosis not present

## 2020-01-10 LAB — CBC WITH DIFFERENTIAL/PLATELET
Abs Immature Granulocytes: 0.03 10*3/uL (ref 0.00–0.07)
Basophils Absolute: 0 10*3/uL (ref 0.0–0.1)
Basophils Relative: 1 %
Eosinophils Absolute: 0.3 10*3/uL (ref 0.0–0.5)
Eosinophils Relative: 4 %
HCT: 37.2 % (ref 36.0–46.0)
Hemoglobin: 12.5 g/dL (ref 12.0–15.0)
Immature Granulocytes: 0 %
Lymphocytes Relative: 26 %
Lymphs Abs: 1.8 10*3/uL (ref 0.7–4.0)
MCH: 30 pg (ref 26.0–34.0)
MCHC: 33.6 g/dL (ref 30.0–36.0)
MCV: 89.4 fL (ref 80.0–100.0)
Monocytes Absolute: 0.9 10*3/uL (ref 0.1–1.0)
Monocytes Relative: 14 %
Neutro Abs: 3.7 10*3/uL (ref 1.7–7.7)
Neutrophils Relative %: 55 %
Platelets: 83 10*3/uL — ABNORMAL LOW (ref 150–400)
RBC: 4.16 MIL/uL (ref 3.87–5.11)
RDW: 13.2 % (ref 11.5–15.5)
WBC: 6.8 10*3/uL (ref 4.0–10.5)
nRBC: 0 % (ref 0.0–0.2)

## 2020-01-10 LAB — GASTROINTESTINAL PANEL BY PCR, STOOL (REPLACES STOOL CULTURE)

## 2020-01-10 LAB — BASIC METABOLIC PANEL
Anion gap: 8 (ref 5–15)
BUN: 12 mg/dL (ref 8–23)
CO2: 22 mmol/L (ref 22–32)
Calcium: 8.5 mg/dL — ABNORMAL LOW (ref 8.9–10.3)
Chloride: 114 mmol/L — ABNORMAL HIGH (ref 98–111)
Creatinine, Ser: 0.78 mg/dL (ref 0.44–1.00)
GFR, Estimated: >60 mL/min (ref >60)
Glucose, Bld: 79 mg/dL (ref 70–99)
Potassium: 3.6 mmol/L (ref 3.5–5.1)
Sodium: 144 mmol/L (ref 135–145)

## 2020-01-10 LAB — URINE CULTURE: Culture: >=100000 — AB

## 2020-01-10 LAB — MAGNESIUM: Magnesium: 1.9 mg/dL (ref 1.7–2.4)

## 2020-01-10 MED ORDER — CEFDINIR 300 MG PO CAPS
600.0000 mg | ORAL_CAPSULE | Freq: Every day | ORAL | Status: DC
  Administered 2020-01-10: 600 mg via ORAL
  Filled 2020-01-10: qty 2

## 2020-01-10 MED ORDER — BOOST PLUS PO LIQD
237.0000 mL | Freq: Three times a day (TID) | ORAL | Status: DC
  Filled 2020-01-10: qty 237

## 2020-01-10 MED ORDER — ENOXAPARIN SODIUM 40 MG/0.4ML ~~LOC~~ SOLN: 40.0000 mg | SUBCUTANEOUS | Status: DC

## 2020-01-10 MED ORDER — CEFDINIR 300 MG PO CAPS: 600.0000 mg | ORAL_CAPSULE | Freq: Every day | ORAL | 0 refills | Status: AC

## 2020-01-10 NOTE — Progress Notes (Addendum)
Initial Nutrition Assessment  INTERVENTION:   -Boost Plus TID- Each supplement provides 360kcal and 14g protein.   -Reviewed bland diet  NUTRITION DIAGNOSIS:   Inadequate oral intake related to nausea, vomiting, diarrhea as evidenced by pt/family report.  GOAL:   Patient will meet greater than or equal to 90% of their needs  MONITOR:   PO intake, Supplement acceptance, Labs, Weight trends, I & O's  REASON FOR ASSESSMENT:   Malnutrition Screening Tool    ASSESSMENT:   69 yo CF with PMH HTN, depression, anxiety, GERD who initially presented to [facility] with unresolving N/V/D for approximately 2 weeks. Her husband had a brief few days of diarrhea approximately 5-7 days prior to the patient developing diarrhea. Of note, patient also had her Covid-19 booster shot approximately 4 days prior to onset of her symptoms as well.  Patient in room with husband at bedside. Pt states she began to have diarrhea following a roadtrip she went on ~2 weeks ago. Pt was eating a lot of fast food but diarrhea never resolved and then she was having N/V. Currently N/V has resolved but pt was still having loose stools. During the past 2 weeks pt was only able to tolerate bananas and some pasta.   Pt ate 50% of breakfast this morning. Tolerating. Requesting Boost supplements, ordered.  Per chart review, noted discharge order placed this afternoon. Reviewed bland diet with pt and pt's husband.   Per weight records, pt has lost 27 lbs since 5/26. Pt states she was working with a nutritionist to lose weight so ~15 lbs of her weight loss was intentional and the remaining is a result of her being sick over the last 2 weeks.  Labs reviewed. Medications: Remeron   NUTRITION - FOCUSED PHYSICAL EXAM:  No depletions.  Diet Order:   Diet Order            Diet Heart Room service appropriate? Yes; Fluid consistency: Thin  Diet effective now                 EDUCATION NEEDS:   Education needs have been  addressed  Skin:  Skin Assessment: Reviewed RN Assessment  Last BM:  10/14  Height:   Ht Readings from Last 1 Encounters:  01/08/20 5\' 2"  (1.575 m)    Weight:   Wt Readings from Last 1 Encounters:  01/08/20 65.3 kg    BMI:  Body mass index is 26.34 kg/m.  Estimated Nutritional Needs:   Kcal:  1650-1850  Protein:  75-85g  Fluid:  1.8L/day  Ashley Bibles, MS, RD, LDN Inpatient Clinical Dietitian Contact information available via Amion

## 2020-01-10 NOTE — Assessment & Plan Note (Signed)
-   UCx grew pansens E Coli - she had some mild symptoms on admission and given negative diarrhea workup, she was resumed back on abx and will complete course at discharge

## 2020-01-10 NOTE — Discharge Summary (Addendum)
Physician Discharge Summary  [name] IWP:[id number] DOB: 09-Jul-1950 DOA: 01/08/2020  PCP: [name], NP  Admit date: 01/08/2020 Discharge date: 01/10/2020  Admitted From: home Disposition:  home Discharging physician: Dwyane Dee, MD  Recommendations for Outpatient Follow-up:  1. Repeat CBC 2/2 thrombocytopenia   Patient discharged to home in Discharge Condition: stable CODE STATUS: Full Diet recommendation:  Diet Orders (From admission, onward)    Start     Ordered   01/09/20 0145  Diet Heart Room service appropriate? Yes; Fluid consistency: Thin  Diet effective now       Question Answer Comment  Room service appropriate? Yes   Fluid consistency: Thin      01/09/20 0145          Hospital Course: [name] is a 69 yo CF with PMH HTN, depression, anxiety, GERD who initially presented to [facility] with unresolving N/V/D for approximately 2 weeks.  Her husband had a brief few days of diarrhea approximately 5-7 days prior to the patient developing diarrhea. Of note, patient also had her Covid-19 booster shot approximately 4 days prior to onset of her symptoms as well.  Initially her UA was suggestive of infection given positive nitrites and small LE however she was minimally symptomatic and she was treated with Rocephin initially, which was not continued on transfer to [facility].  Stool studies were ordered for testing. C diff was negative.  Due to her GI losses, she also had an AKI on workup and was started on IVF.  Her renal function recovered after ongoing fluids. Her remaining stool studies returned negative including GI pathogen panel and C. difficile as noted above.  Her symptoms did slowly improve and she was able to tolerate a diet prior to discharge. Given her negative diarrhea workup but remaining subtle UTI symptoms she was resumed on abx for treating UTI; she was started on cefdinir and this was continued on discharge. Ucx grew pan-sensitive E. Coli.    * Acute  kidney injury (HCC)-resolved as of 01/10/2020 - patient unable to keep up with oral intake alone in setting of severe GI losses from diarrhea - resolved with IVF  Nausea vomiting and diarrhea-resolved as of 01/10/2020 - etiology still unclear; infection still remains on differential, more than likely viral as well - husband with similar diarrheal course but healed fast; patient did have covid-19 booster about 4 days prior to onset of sxms (possible immunosuppression predisposing to current infection) - Cdiff negative; GI pathogen panel negative too - her diarrhea and N/V did slowly improve after IVF; she tolerated a diet prior to discharge   Thrombocytopenia ([facility]) - still suspected due to infection - repeat CBC next week; patient understands to follow up outpt for repeat bloodwork   Acute cystitis - UCx grew pansens E Coli - she had some mild symptoms on admission and given negative diarrhea workup, she was resumed back on abx and will complete course at discharge    The patient's chronic medical conditions were treated accordingly per the patient's home medication regimen except as noted.  On day of discharge, patient was felt deemed stable for discharge. Patient/family member advised to call PCP or come back to ER if needed.   Discharge Diagnoses:   Principal Diagnosis: Acute kidney injury [facility])  Active Hospital Problems   Diagnosis Date Noted  . Thrombocytopenia ([facility]) 01/09/2020    Priority: Low  . Acute cystitis 01/10/2020  . Hypertension     Resolved Hospital Problems   Diagnosis Date Noted Date Resolved  .  Acute kidney injury [facility]) 01/08/2020 01/10/2020    Priority: High  . Nausea vomiting and diarrhea 01/09/2020 01/10/2020    Priority: Medium    Discharge Instructions    Increase activity slowly   Complete by: As directed      Allergies as of 01/10/2020      Reactions   Compazine [prochlorperazine]    hyperactivity   Morphine And Related Hives, Itching    Trintellix [vortioxetine] Nausea And Vomiting      Medication List    STOP taking these medications   albuterol 108 (90 Base) MCG/ACT inhaler Commonly known as: VENTOLIN HFA   azelastine 0.1 % nasal spray Commonly known as: ASTELIN   benzonatate 100 MG capsule Commonly known as: TESSALON   budesonide-formoterol 160-4.5 MCG/ACT inhaler Commonly known as: Symbicort   cetirizine 10 MG tablet Commonly known as: ZYRTEC   fluticasone 50 MCG/ACT nasal spray Commonly known as: FLONASE     TAKE these medications   AIRBORNE PO Take by mouth.   amLODipine 5 MG tablet Commonly known as: NORVASC TAKE 1 TABLET BY MOUTH EVERY DAY.MAX ON INSURANCE What changed: See the new instructions.   amphetamine-dextroamphetamine 20 MG tablet Commonly known as: ADDERALL Take 20 mg by mouth daily.   cefdinir 300 MG capsule Commonly known as: OMNICEF Take 2 capsules (600 mg total) by mouth daily for 3 days. Start taking on: January 11, 2020   clonazePAM 1 MG tablet Commonly known as: KLONOPIN TAKE 1 TABLET BY MOUTH THREE TIMES A DAY AS NEEDED FOR ANXIETY What changed:   how much to take  how to take this  when to take this  reasons to take this  additional instructions   clotrimazole-betamethasone cream Commonly known as: Lotrisone Apply 1 application topically 2 (two) times daily.   FOLIC ACID PO Take by mouth.   gabapentin 300 MG capsule Commonly known as: NEURONTIN Take 300 mg by mouth 4 (four) times daily.   hydrOXYzine 10 MG tablet Commonly known as: ATARAX/VISTARIL Take 10 mg by mouth 3 (three) times daily.   MAGNESIUM PO Take by mouth.   mirtazapine 45 MG tablet Commonly known as: REMERON Take 45 mg by mouth at bedtime.   multivitamin capsule Take 1 capsule by mouth daily.   omeprazole 40 MG capsule Commonly known as: PRILOSEC Take 1 capsule (40 mg total) by mouth in the morning and at bedtime for 60 doses. Please cancel refills on old rx for omeprazole  40 mg once a day   PARoxetine 40 MG tablet Commonly known as: PAXIL TAKE 1 TABLET BY MOUTH IN  THE MORNING What changed: when to take this   PROBIOTIC DAILY PO Take 1 capsule by mouth daily.   traZODone 50 MG tablet Commonly known as: DESYREL Take 50-150 mg by mouth at bedtime as needed for sleep.       Follow-up Information    [name], NP. Schedule an appointment as soon as possible for a visit in 1 week(s).   Specialty: Internal Medicine Contact information: [address] [place] [address] [phone]              Allergies  Allergen Reactions  . Compazine [Prochlorperazine]     hyperactivity  . Morphine And Related Hives and Itching  . Trintellix [Vortioxetine] Nausea And Vomiting    Consultations: none  Discharge Exam: BP 125/73 (BP Location: Right Arm)   Pulse 66   Temp (!) 97.4 F (36.3 C) (Oral)   Resp  18   Ht 5\' 2"  (1.575 m)   Wt 65.3 kg   LMP 12/28/2011   SpO2 94%   BMI 26.34 kg/m  General appearance: alert, cooperative and no distress Head: Normocephalic, without obvious abnormality, atraumatic Eyes: EOMI Lungs: clear to auscultation bilaterally Heart: regular rate and rhythm and S1, S2 normal Abdomen: mild nonspecific TTP; BS present; no R/G Extremities: no edema Skin: mobility and turgor normal and no edema Neurologic: Grossly normal  The results of significant diagnostics from this hospitalization (including imaging, microbiology, ancillary and laboratory) are listed below for reference.   Microbiology: Recent Results (from the past 240 hour(s))  Urine culture     Status: Abnormal   Collection Time: 01/08/20  8:12 PM   Specimen: Urine, Random  Result Value Ref Range Status   Specimen Description   Final    URINE, RANDOM Performed at [facility], [address] [place], [place] [address]    Special Requests   Final    NONE Performed at [facility], [address]  [place], [place] [address]    Culture >=100,000 COLONIES/mL ESCHERICHIA COLI (A)  Final   Report Status 01/10/2020 FINAL  Final   Organism ID, Bacteria ESCHERICHIA COLI (A)  Final      Susceptibility   Escherichia coli - MIC*    AMPICILLIN 4 SENSITIVE Sensitive     CEFAZOLIN <=4 SENSITIVE Sensitive     CEFTRIAXONE <=0.25 SENSITIVE Sensitive     CIPROFLOXACIN <=0.25 SENSITIVE Sensitive     GENTAMICIN <=1 SENSITIVE Sensitive     IMIPENEM <=0.25 SENSITIVE Sensitive     NITROFURANTOIN <=16 SENSITIVE Sensitive     TRIMETH/SULFA <=20 SENSITIVE Sensitive     AMPICILLIN/SULBACTAM <=2 SENSITIVE Sensitive     PIP/TAZO <=4 SENSITIVE Sensitive     * >=100,000 COLONIES/mL ESCHERICHIA COLI  Respiratory Panel by RT PCR (Flu A&B, Covid) - Nasopharyngeal Swab     Status: None   Collection Time: 01/08/20  9:21 PM   Specimen: Nasopharyngeal Swab  Result Value Ref Range Status   SARS Coronavirus 2 by RT PCR NEGATIVE NEGATIVE Final    Comment: (NOTE) SARS-CoV-2 target nucleic acids are NOT DETECTED.  The SARS-CoV-2 RNA is generally detectable in upper respiratoy specimens during the acute phase of infection. The lowest concentration of SARS-CoV-2 viral copies this assay can detect is 131 copies/mL. A negative result does not preclude SARS-Cov-2 infection and should not be used as the sole basis for treatment or other patient management decisions. A negative result may occur with  improper specimen collection/handling, submission of specimen other than nasopharyngeal swab, presence of viral mutation(s) within the areas targeted by this assay, and inadequate number of viral copies (<131 copies/mL). A negative result must be combined with clinical observations, patient history, and epidemiological information. The expected result is Negative.  Fact Sheet for Patients:  PinkCheek.be  Fact Sheet for Healthcare Providers:  GravelBags.it  This test  is no t yet approved or cleared by the [place] FDA and  has been authorized for detection and/or diagnosis of SARS-CoV-2 by FDA under an Emergency Use Authorization (EUA). This EUA will remain  in effect (meaning this test can be used) for the duration of the COVID-19 declaration under Section 564(b)(1) of the Act, 21 U.S.C. section 360bbb-3(b)(1), unless the authorization is terminated or revoked sooner.     Influenza A by PCR NEGATIVE NEGATIVE Final   Influenza B by PCR NEGATIVE NEGATIVE Final    Comment: (  NOTE) The Xpert Xpress SARS-CoV-2/FLU/RSV assay is intended as an aid in  the diagnosis of influenza from Nasopharyngeal swab specimens and  should not be used as a sole basis for treatment. Nasal washings and  aspirates are unacceptable for Xpert Xpress SARS-CoV-2/FLU/RSV  testing.  Fact Sheet for Patients: PinkCheek.be  Fact Sheet for Healthcare Providers: GravelBags.it  This test is not yet approved or cleared by the [place] FDA and  has been authorized for detection and/or diagnosis of SARS-CoV-2 by  FDA under an Emergency Use Authorization (EUA). This EUA will remain  in effect (meaning this test can be used) for the duration of the  Covid-19 declaration under Section 564(b)(1) of the Act, 21  U.S.C. section 360bbb-3(b)(1), unless the authorization is  terminated or revoked. Performed at [facility], [address] [place], [place] [address]   C Difficile Quick Screen w PCR reflex     Status: None   Collection Time: 01/09/20 11:59 AM  Result Value Ref Range Status   C Diff antigen NEGATIVE NEGATIVE Final   C Diff toxin NEGATIVE NEGATIVE Final   C Diff interpretation No C. difficile detected.  Final    Comment: Performed at [facility], [place] [address], [place], [place] [address]  Gastrointestinal Panel by PCR , Stool     Status: None   Collection Time: 01/09/20 11:59  AM  Result Value Ref Range Status   Campylobacter species NOT DETECTED NOT DETECTED Final   Plesimonas shigelloides NOT DETECTED NOT DETECTED Final   Salmonella species NOT DETECTED NOT DETECTED Final   Yersinia enterocolitica NOT DETECTED NOT DETECTED Final   Vibrio species NOT DETECTED NOT DETECTED Final   Vibrio cholerae NOT DETECTED NOT DETECTED Final   Enteroaggregative E coli (EAEC) NOT DETECTED NOT DETECTED Final   Enteropathogenic E coli (EPEC) NOT DETECTED NOT DETECTED Final   Enterotoxigenic E coli (ETEC) NOT DETECTED NOT DETECTED Final   Shiga like toxin producing E coli (STEC) NOT DETECTED NOT DETECTED Final   Shigella/Enteroinvasive E coli (EIEC) NOT DETECTED NOT DETECTED Final   Cryptosporidium NOT DETECTED NOT DETECTED Final   Cyclospora cayetanensis NOT DETECTED NOT DETECTED Final   Entamoeba histolytica NOT DETECTED NOT DETECTED Final   Giardia lamblia NOT DETECTED NOT DETECTED Final   Adenovirus F40/41 NOT DETECTED NOT DETECTED Final   Astrovirus NOT DETECTED NOT DETECTED Final   Norovirus GI/GII NOT DETECTED NOT DETECTED Final   Rotavirus A NOT DETECTED NOT DETECTED Final   Sapovirus (I, II, IV, and V) NOT DETECTED NOT DETECTED Final    Comment: Performed at [facility], [address] [place], [place] [address]     Labs: BNP (last 3 results) No results for input(s): BNP in the last 8760 hours. Basic Metabolic Panel: Recent Labs  Lab 01/08/20 1756 01/08/20 2130 01/09/20 0206 01/10/20 0514  NA 137  --  139 144  K 3.3*  --  3.8 3.6  CL 102  --  111 114*  CO2 20*  --  19* 22  GLUCOSE 83  --  79 79  BUN 26*  --  23 12  CREATININE 1.72*  --  1.28* 0.78  CALCIUM 9.5  --  8.4* 8.5*  MG  --  1.8 2.0 1.9   Liver Function Tests: Recent Labs  Lab 01/08/20 1756 01/09/20 0206  AST 29 27  ALT 21 16  ALKPHOS 83 68  BILITOT 0.7 0.5  PROT 7.5 6.0*  ALBUMIN 4.3 3.4*  Recent Labs  Lab 01/08/20 1756  LIPASE 55*   No results for input(s):  AMMONIA in the last 168 hours. CBC: Recent Labs  Lab 01/08/20 1756 01/09/20 0206 01/10/20 0514  WBC 10.4 8.3 6.8  NEUTROABS 5.9 4.4 3.7  HGB 15.4* 13.0 12.5  HCT 44.5 39.2 37.2  MCV 86.7 92.2 89.4  PLT 121* 92* 83*   Cardiac Enzymes: No results for input(s): CKTOTAL, CKMB, CKMBINDEX, TROPONINI in the last 168 hours. BNP: Invalid input(s): POCBNP CBG: No results for input(s): GLUCAP in the last 168 hours. D-Dimer No results for input(s): DDIMER in the last 72 hours. Hgb A1c No results for input(s): HGBA1C in the last 72 hours. Lipid Profile No results for input(s): CHOL, HDL, LDLCALC, TRIG, CHOLHDL, LDLDIRECT in the last 72 hours. Thyroid function studies No results for input(s): TSH, T4TOTAL, T3FREE, THYROIDAB in the last 72 hours.  Invalid input(s): FREET3 Anemia work up No results for input(s): VITAMINB12, FOLATE, FERRITIN, TIBC, IRON, RETICCTPCT in the last 72 hours. Urinalysis    Component Value Date/Time   COLORURINE YELLOW 01/08/2020 1756   APPEARANCEUR CLOUDY (A) 01/08/2020 1756   LABSPEC 1.025 01/08/2020 1756   PHURINE 5.5 01/08/2020 1756   GLUCOSEU NEGATIVE 01/08/2020 1756   HGBUR NEGATIVE 01/08/2020 1756   BILIRUBINUR SMALL (A) 01/08/2020 1756   BILIRUBINUR - 11/15/2012 1329   KETONESUR 40 (A) 01/08/2020 1756   PROTEINUR NEGATIVE 01/08/2020 1756   UROBILINOGEN negative 11/15/2012 1329   UROBILINOGEN 0.2 11/19/2008 1635   NITRITE POSITIVE (A) 01/08/2020 1756   LEUKOCYTESUR SMALL (A) 01/08/2020 1756   Sepsis Labs Invalid input(s): PROCALCITONIN,  WBC,  LACTICIDVEN Microbiology Recent Results (from the past 240 hour(s))  Urine culture     Status: Abnormal   Collection Time: 01/08/20  8:12 PM   Specimen: Urine, Random  Result Value Ref Range Status   Specimen Description   Final    URINE, RANDOM Performed at [facility], [address] [place], [place] [address]    Special Requests   Final    NONE Performed at [facility],  [address] [place], [place] [address]    Culture >=100,000 COLONIES/mL ESCHERICHIA COLI (A)  Final   Report Status 01/10/2020 FINAL  Final   Organism ID, Bacteria ESCHERICHIA COLI (A)  Final      Susceptibility   Escherichia coli - MIC*    AMPICILLIN 4 SENSITIVE Sensitive     CEFAZOLIN <=4 SENSITIVE Sensitive     CEFTRIAXONE <=0.25 SENSITIVE Sensitive     CIPROFLOXACIN <=0.25 SENSITIVE Sensitive     GENTAMICIN <=1 SENSITIVE Sensitive     IMIPENEM <=0.25 SENSITIVE Sensitive     NITROFURANTOIN <=16 SENSITIVE Sensitive     TRIMETH/SULFA <=20 SENSITIVE Sensitive     AMPICILLIN/SULBACTAM <=2 SENSITIVE Sensitive     PIP/TAZO <=4 SENSITIVE Sensitive     * >=100,000 COLONIES/mL ESCHERICHIA COLI  Respiratory Panel by RT PCR (Flu A&B, Covid) - Nasopharyngeal Swab     Status: None   Collection Time: 01/08/20  9:21 PM   Specimen: Nasopharyngeal Swab  Result Value Ref Range Status   SARS Coronavirus 2 by RT PCR NEGATIVE NEGATIVE Final    Comment: (NOTE) SARS-CoV-2 target nucleic acids are NOT DETECTED.  The SARS-CoV-2 RNA is generally detectable in upper respiratoy specimens during the acute phase of infection. The lowest concentration of SARS-CoV-2 viral copies this assay can detect is 131 copies/mL. A negative result does not preclude SARS-Cov-2 infection and should not  be used as the sole basis for treatment or other patient management decisions. A negative result may occur with  improper specimen collection/handling, submission of specimen other than nasopharyngeal swab, presence of viral mutation(s) within the areas targeted by this assay, and inadequate number of viral copies (<131 copies/mL). A negative result must be combined with clinical observations, patient history, and epidemiological information. The expected result is Negative.  Fact Sheet for Patients:  PinkCheek.be  Fact Sheet for Healthcare Providers:   GravelBags.it  This test is no t yet approved or cleared by the [place] FDA and  has been authorized for detection and/or diagnosis of SARS-CoV-2 by FDA under an Emergency Use Authorization (EUA). This EUA will remain  in effect (meaning this test can be used) for the duration of the COVID-19 declaration under Section 564(b)(1) of the Act, 21 U.S.C. section 360bbb-3(b)(1), unless the authorization is terminated or revoked sooner.     Influenza A by PCR NEGATIVE NEGATIVE Final   Influenza B by PCR NEGATIVE NEGATIVE Final    Comment: (NOTE) The Xpert Xpress SARS-CoV-2/FLU/RSV assay is intended as an aid in  the diagnosis of influenza from Nasopharyngeal swab specimens and  should not be used as a sole basis for treatment. Nasal washings and  aspirates are unacceptable for Xpert Xpress SARS-CoV-2/FLU/RSV  testing.  Fact Sheet for Patients: PinkCheek.be  Fact Sheet for Healthcare Providers: GravelBags.it  This test is not yet approved or cleared by the [place] FDA and  has been authorized for detection and/or diagnosis of SARS-CoV-2 by  FDA under an Emergency Use Authorization (EUA). This EUA will remain  in effect (meaning this test can be used) for the duration of the  Covid-19 declaration under Section 564(b)(1) of the Act, 21  U.S.C. section 360bbb-3(b)(1), unless the authorization is  terminated or revoked. Performed at [facility], [address] [place], [place] [address]   C Difficile Quick Screen w PCR reflex     Status: None   Collection Time: 01/09/20 11:59 AM  Result Value Ref Range Status   C Diff antigen NEGATIVE NEGATIVE Final   C Diff toxin NEGATIVE NEGATIVE Final   C Diff interpretation No C. difficile detected.  Final    Comment: Performed at [facility], [place] [address], [place], [place] [address]  Gastrointestinal Panel by PCR ,  Stool     Status: None   Collection Time: 01/09/20 11:59 AM  Result Value Ref Range Status   Campylobacter species NOT DETECTED NOT DETECTED Final   Plesimonas shigelloides NOT DETECTED NOT DETECTED Final   Salmonella species NOT DETECTED NOT DETECTED Final   Yersinia enterocolitica NOT DETECTED NOT DETECTED Final   Vibrio species NOT DETECTED NOT DETECTED Final   Vibrio cholerae NOT DETECTED NOT DETECTED Final   Enteroaggregative E coli (EAEC) NOT DETECTED NOT DETECTED Final   Enteropathogenic E coli (EPEC) NOT DETECTED NOT DETECTED Final   Enterotoxigenic E coli (ETEC) NOT DETECTED NOT DETECTED Final   Shiga like toxin producing E coli (STEC) NOT DETECTED NOT DETECTED Final   Shigella/Enteroinvasive E coli (EIEC) NOT DETECTED NOT DETECTED Final   Cryptosporidium NOT DETECTED NOT DETECTED Final   Cyclospora cayetanensis NOT DETECTED NOT DETECTED Final   Entamoeba histolytica NOT DETECTED NOT DETECTED Final   Giardia lamblia NOT DETECTED NOT DETECTED Final   Adenovirus F40/41 NOT DETECTED NOT DETECTED Final   Astrovirus NOT DETECTED NOT DETECTED Final   Norovirus GI/GII NOT DETECTED NOT DETECTED Final  Rotavirus A NOT DETECTED NOT DETECTED Final   Sapovirus (I, II, IV, and V) NOT DETECTED NOT DETECTED Final    Comment: Performed at [facility], [address] [place], [place] [address]    Procedures/Studies: No results found.   Time coordinating discharge: Over 30 minutes    Dwyane Dee, MD  [facility] 01/10/2020, 3:07 PM

## 2020-01-18 ENCOUNTER — Ambulatory Visit (INDEPENDENT_AMBULATORY_CARE_PROVIDER_SITE_OTHER): Payer: 59 | Admitting: Family

## 2020-01-18 ENCOUNTER — Other Ambulatory Visit: Payer: Self-pay

## 2020-01-18 VITALS — BP 123/59 | HR 64 | Temp 98.6°F | Resp 16 | Wt 152.0 lb

## 2020-01-18 DIAGNOSIS — N390 Urinary tract infection, site not specified: Secondary | ICD-10-CM

## 2020-01-18 DIAGNOSIS — K529 Noninfective gastroenteritis and colitis, unspecified: Secondary | ICD-10-CM | POA: Diagnosis not present

## 2020-01-18 DIAGNOSIS — N179 Acute kidney failure, unspecified: Secondary | ICD-10-CM

## 2020-01-18 DIAGNOSIS — B9620 Unspecified Escherichia coli [E. coli] as the cause of diseases classified elsewhere: Secondary | ICD-10-CM

## 2020-01-18 NOTE — Progress Notes (Signed)
Subjective:    Patient ID: [name], female    DOB: [birth year], 69 y.o.   MRN: [id number]  HPI   Patient is a 69 yr old female who presents today for hospital follow up.  We saw the patient on January 08, 2020 due to complaint of diarrhea, vomiting, and weakness.  Due to the severity of her illness and inability to keep down food and liquid, we referred the patient to the emergency department.  In the emergency department she was found to be mildly hypotensive and was given IV hydration.  Lab work noted worsening creatinine and urine was concerning for UTI.  She was treated with ceftriaxone and was admitted for further management of her acute renal failure with intractable vomiting and diarrhea.  She was hospitalized January 08, 2020 through January 10, 2020.  During her admission she underwent a stool panel which was negative.  It was felt that her symptoms were likely viral in nature.  She was also to have thrombocytopenia during this admission which was felt to be due to infection.  Her urine culture grew greater than 100,000 colonies of E. Coli. She was treated with antibiotics.   Since being discharged home, she reports that she is tolerating food and liquid without difficulty.  Her appetite is not fully restored, however she feels like she is keeping up with her hydration.  She reports resolution of diarrhea.  She states she is still fatigued and is slowly regaining her strength.  Review of Systems See HPI  Past Medical History:  Diagnosis Date  . Allergy    seasonal  . Arachnoiditis   . Depression   . Elevated liver enzymes   . Environmental allergies   . GERD (gastroesophageal reflux disease)   . H/O being hospitalized 03/2012   x1 week for nausea  . History of chicken pox   . Hypertension   . Migraine with typical aura    resolved years ago  . Nausea    chronic nausea      Social History   Socioeconomic History  . Marital status: Married    Spouse name: Not on file    . Number of children: Not on file  . Years of education: Not on file  . Highest education level: Not on file  Occupational History  . Not on file  Tobacco Use  . Smoking status: Former Smoker    Packs/day: 0.25    Years: 8.00    Pack years: 2.00    Types: Cigarettes    Quit date: 08/09/1986    Years since quitting: 33.4  . Smokeless tobacco: Never Used  Substance and Sexual Activity  . Alcohol use: No  . Drug use: No  . Sexual activity: Not Currently    Partners: Male    Birth control/protection: Other-see comments    Comment: husband had vasectomy.   Other Topics Concern  . Not on file  Social History Narrative   2 step daughters- 91 and 52   She worked as an Web designer.   Enjoys puzzles, counted cross stitch.   Completed 1 year of college   4 cats   Social Determinants of Health   Financial Resource Strain: Low Risk   . Difficulty of Paying Living Expenses: Not hard at all  Food Insecurity: No Food Insecurity  . Worried About Charity fundraiser in the Last Year: Never true  . Ran Out of Food in the Last Year: Never true  Transportation Needs: No  Transportation Needs  . Lack of Transportation (Medical): No  . Lack of Transportation (Non-Medical): No  Physical Activity:   . Days of Exercise per Week: Not on file  . Minutes of Exercise per Session: Not on file  Stress:   . Feeling of Stress : Not on file  Social Connections:   . Frequency of Communication with Friends and Family: Not on file  . Frequency of Social Gatherings with Friends and Family: Not on file  . Attends Religious Services: Not on file  . Active Member of Clubs or Organizations: Not on file  . Attends Archivist Meetings: Not on file  . Marital Status: Not on file  Intimate Partner Violence:   . Fear of Current or Ex-Partner: Not on file  . Emotionally Abused: Not on file  . Physically Abused: Not on file  . Sexually Abused: Not on file    Past Surgical History:   Procedure Laterality Date  . ABDOMINOPLASTY  07/11/09  . EUS N/A 05/05/2012   Procedure: UPPER ENDOSCOPIC ULTRASOUND (EUS) LINEAR;  Surgeon: Beryle Beams, MD;  Location: [facility];  Service: Endoscopy;  Laterality: N/A;  . LAPAROSCOPIC CHOLECYSTECTOMY  2010  . NASAL SINUS SURGERY     "Has had 2-3 surgeries over the years"    Family History  Problem Relation Age of Onset  . Alzheimer's disease Mother   . Hypertension Mother   . Migraines Mother   . Hypertension Father   . Other Father        ?carotid artery aneurysm  . Cancer Brother        mouth and throat    Allergies  Allergen Reactions  . Compazine [Prochlorperazine]     hyperactivity  . Morphine And Related Hives and Itching  . Trintellix [Vortioxetine] Nausea And Vomiting    Current Outpatient Medications on File Prior to Visit  Medication Sig Dispense Refill  . amLODipine (NORVASC) 5 MG tablet TAKE 1 TABLET BY MOUTH EVERY DAY.MAX ON INSURANCE (Patient taking differently: Take 5 mg by mouth daily. ) 30 tablet 5  . amphetamine-dextroamphetamine (ADDERALL) 20 MG tablet Take 20 mg by mouth daily.    . clonazePAM (KLONOPIN) 1 MG tablet TAKE 1 TABLET BY MOUTH THREE TIMES A DAY AS NEEDED FOR ANXIETY (Patient taking differently: Take 1 mg by mouth 3 (three) times daily as needed for anxiety. ) 90 tablet 0  . clotrimazole-betamethasone (LOTRISONE) cream Apply 1 application topically 2 (two) times daily. 30 g 0  . hydrOXYzine (ATARAX/VISTARIL) 10 MG tablet Take 10 mg by mouth 3 (three) times daily.    . mirtazapine (REMERON) 45 MG tablet Take 45 mg by mouth at bedtime.     . Multiple Vitamin (MULTIVITAMIN) capsule Take 1 capsule by mouth daily.    . Multiple Vitamins-Minerals (AIRBORNE PO) Take by mouth.    Marland Kitchen PARoxetine (PAXIL) 40 MG tablet TAKE 1 TABLET BY MOUTH IN  THE MORNING (Patient taking differently: Take 40 mg by mouth in the morning. ) 90 tablet 3  . Probiotic Product (PROBIOTIC DAILY PO) Take 1 capsule by mouth  daily.     . traZODone (DESYREL) 50 MG tablet Take 50-150 mg by mouth at bedtime as needed for sleep.     Marland Kitchen FOLIC ACID PO Take by mouth.    . gabapentin (NEURONTIN) 300 MG capsule Take 300 mg by mouth 4 (four) times daily.    Marland Kitchen omeprazole (PRILOSEC) 40 MG capsule Take 1 capsule (40 mg total) by mouth in  the morning and at bedtime for 60 doses. Please cancel refills on old rx for omeprazole 40 mg once a day 60 capsule 2   No current facility-administered medications on file prior to visit.    BP (!) 123/59 (BP Location: Right Arm, Patient Position: Sitting, Cuff Size: Small)   Pulse 64   Temp 98.6 F (37 C) (Oral)   Resp 16   Wt 152 lb (68.9 kg)   LMP 12/28/2011   SpO2 96%   BMI 27.80 kg/m       Objective:   Physical Exam Constitutional:      Appearance: She is well-developed.  Cardiovascular:     Rate and Rhythm: Normal rate and regular rhythm.     Heart sounds: Normal heart sounds. No murmur heard.   Pulmonary:     Effort: Pulmonary effort is normal. No respiratory distress.     Breath sounds: Normal breath sounds. No wheezing.  Psychiatric:        Behavior: Behavior normal.        Thought Content: Thought content normal.        Judgment: Judgment normal.           Assessment & Plan:  Viral gastroenteritis-clinically resolved.  We will continue to monitor.  E. coli urinary tract infection-clinically resolved following antibiotic treatment.  Will monitor.  Acute renal failure-creatinine had returned to normal at time of discharge. Lab Results  Component Value Date   CREATININE 0.78 01/10/2020   We will obtain follow-up complete metabolic panel and CBC.  This visit occurred during the SARS-CoV-2 public health emergency.  Safety protocols were in place, including screening questions prior to the visit, additional usage of staff PPE, and extensive cleaning of exam room while observing appropriate contact time as indicated for disinfecting solutions.

## 2020-01-18 NOTE — Patient Instructions (Signed)
Please complete lab work prior to leaving.   

## 2020-01-19 LAB — CBC WITH DIFFERENTIAL/PLATELET
Absolute Monocytes: 723 cells/uL (ref 200–950)
Basophils Absolute: 59 cells/uL (ref 0–200)
Basophils Relative: 0.6 %
Eosinophils Absolute: 297 cells/uL (ref 15–500)
Eosinophils Relative: 3 %
HCT: 42.3 % (ref 35.0–45.0)
Hemoglobin: 13.8 g/dL (ref 11.7–15.5)
Lymphs Abs: 1535 cells/uL (ref 850–3900)
MCH: 29.8 pg (ref 27.0–33.0)
MCHC: 32.6 g/dL (ref 32.0–36.0)
MCV: 91.4 fL (ref 80.0–100.0)
MPV: 11.1 fL (ref 7.5–12.5)
Monocytes Relative: 7.3 %
Neutro Abs: 7286 cells/uL (ref 1500–7800)
Neutrophils Relative %: 73.6 %
Platelets: 138 10*3/uL — ABNORMAL LOW (ref 140–400)
RBC: 4.63 10*6/uL (ref 3.80–5.10)
RDW: 12.9 % (ref 11.0–15.0)
Total Lymphocyte: 15.5 %
WBC: 9.9 10*3/uL (ref 3.8–10.8)

## 2020-01-19 LAB — COMPREHENSIVE METABOLIC PANEL
AG Ratio: 1.6 (calc) (ref 1.0–2.5)
ALT: 15 U/L (ref 6–29)
AST: 17 U/L (ref 10–35)
Albumin: 3.9 g/dL (ref 3.6–5.1)
Alkaline phosphatase (APISO): 89 U/L (ref 37–153)
BUN: 15 mg/dL (ref 7–25)
CO2: 30 mmol/L (ref 20–32)
Calcium: 9.4 mg/dL (ref 8.6–10.4)
Chloride: 105 mmol/L (ref 98–110)
Creat: 0.97 mg/dL (ref 0.50–0.99)
Globulin: 2.4 g/dL (calc) (ref 1.9–3.7)
Glucose, Bld: 96 mg/dL (ref 65–99)
Potassium: 4.1 mmol/L (ref 3.5–5.3)
Sodium: 142 mmol/L (ref 135–146)
Total Bilirubin: 0.2 mg/dL (ref 0.2–1.2)
Total Protein: 6.3 g/dL (ref 6.1–8.1)

## 2020-01-21 ENCOUNTER — Encounter: Payer: Self-pay | Admitting: Family

## 2020-01-21 DIAGNOSIS — R197 Diarrhea, unspecified: Secondary | ICD-10-CM

## 2020-01-23 ENCOUNTER — Ambulatory Visit (INDEPENDENT_AMBULATORY_CARE_PROVIDER_SITE_OTHER): Payer: 59 | Admitting: Gastroenterology

## 2020-01-23 ENCOUNTER — Encounter: Payer: Self-pay | Admitting: Gastroenterology

## 2020-01-23 VITALS — BP 120/82 | HR 73 | Ht 61.5 in | Wt 148.0 lb

## 2020-01-23 DIAGNOSIS — R109 Unspecified abdominal pain: Secondary | ICD-10-CM | POA: Diagnosis not present

## 2020-01-23 DIAGNOSIS — R112 Nausea with vomiting, unspecified: Secondary | ICD-10-CM

## 2020-01-23 DIAGNOSIS — R197 Diarrhea, unspecified: Secondary | ICD-10-CM | POA: Diagnosis not present

## 2020-01-23 MED ORDER — ONDANSETRON 4 MG PO TBDP: 4.0000 mg | ORAL_TABLET | Freq: Four times a day (QID) | ORAL | 0 refills | Status: DC | PRN

## 2020-01-23 NOTE — Progress Notes (Signed)
Chief Complaint: N/V  Referring Provider:  [name], NP      ASSESSMENT AND PLAN;   #1. N/V  #2. Diarrhea. Neg colon with Bx 2017. D/d includes IBS-D triggered by recent gastroenteritis, infectious causes, postcholecystectomy diarrhea, diarrhea d/t medications, doubt microscopic colitis, IBD, malabsorption, celiac disease.  #3. abdo pain   Plan: -Stool studies for GI Pathogen (includes C. Diff), culture,O&P, fecal elastase, fat and Calprotectin. -EGD with SB bx -If neg CT AP with p.o. and IV contrast. -Zofran 4mg  OTD Q6hrs as needed for N/V #30. -Stop all nutritional supplements except for probiotics. -She has stopped taking omeprazole on her own.  If need be, we would give her a trial of Protonix after EGD. -Extensive notes, labs, recent hospitalization, MR abdomen was reviewed.    HPI:    [name] is a 69 y.o. female  With extensive complex GI history, has been to multiple gastroenterologists. With history of anxiety/depression, GERD, HTN, migraine has been seen at [facility] for chronic nausea/vomiting/diarrhea several years ago by Dr. Derrill Kay and diagnosed as having IBS.  She had negative extensive GI evaluation as below.  Overall has been done well over the last few years until she had recent gastroenteritis while visiting her brother (who has been diagnosed with head and neck CA) in [place].  She started having profuse diarrhea after eating cheese quesadilla followed by nausea/vomiting.  She left [place] early and had to "stop at every stop" with diarrhea, spillage and incontinence.  She had to change her clothes twice.  Admitted 01/08/2020-01/10/2020 with dehydration, AKI treated with IV fluids.  Her stool studies including stool for C. difficile was negative.  Also was found to have UTI and treated with Cipro.  From GI standpoint, she was managed conservatively.  Seen in the GI clinic as an emergency working as she  continues to have nausea/occasional vomiting/diarrhea.  C/O mostly nausea currently.  Has not vomited over the last 2 days.  Has midepigastric pain with abdominal bloating.  Episodes of diarrhea have decreased from 10-12/day to 3-4/day.  Her baseline is 1 to 2/day.  No melena or hematochezia.  No significant nocturnal symptoms. Occ constipation (better with 1 prune)  Has generalized abdo pain with rumbling.  Continues to have postprandial epigastric pain.  Has lost 4 pounds over the last 1 week.  Wt Readings from Last 3 Encounters:  01/23/20 148 lb (67.1 kg)  01/18/20 152 lb (68.9 kg)  01/08/20 144 lb (65.3 kg)   Previously had abnormal LFTs due to fatty liver.  Had EUS performed by Dr. Benson Norway as below which only showed fatty liver.  No choledocholithiasis.  Most recently LFTs have been normal.   Records from Care Everywhere:  H/O Nausea 2017  1. Nausea may be due to IBS-like syndrome. Bentyl 10 mg QID and increasing doses of Citracill 2. Stress related nausea may be a separate issue and she will see you regarding adjustment of appropriate drugs for this.  1. EGD 02/17/2011 (Dr Collene Mares)- Small HH, normal --- bx showed no H pylori. Neg eso Bx for EoE 2. Colonoscopy 2017 at [facility] showed no colitis 3. Smartpill motility study--normal stomach, small bowel and colon transit times  MRI abdomen 01/06/2019 1. Mild motion degradation. 2. No dominant or suspicious renal lesion. No correlate for the ultrasound abnormality. 3. Tiny bilateral renal lesions which are likely cysts. A 4 mm lower pole right renal angiomyolipoma is similar back to 11/19/2008. 4.  Tiny hiatal hernia. 5.  Hepatic steatosis.  Past Medical History:  Diagnosis Date   Allergy    seasonal   Arachnoiditis    Depression    Elevated liver enzymes    Environmental allergies    GERD (gastroesophageal reflux disease)    H/O being hospitalized 03/2012   x1 week for nausea   History of chicken pox     Hypertension    Migraine with typical aura    resolved years ago   Nausea    chronic nausea     Past Surgical History:  Procedure Laterality Date   ABDOMINOPLASTY  07/11/09   EUS N/A 05/05/2012   Procedure: UPPER ENDOSCOPIC ULTRASOUND (EUS) LINEAR;  Surgeon: Beryle Beams, MD;  Location: [facility];  Service: Endoscopy;  Laterality: N/A;   LAPAROSCOPIC CHOLECYSTECTOMY  2010   NASAL SINUS SURGERY     "Has had 2-3 surgeries over the years"    Family History  Problem Relation Age of Onset   Alzheimer's disease Mother    Hypertension Mother    Migraines Mother    Hypertension Father    Other Father        ?carotid artery aneurysm   Cancer Brother        mouth and throat   Colon cancer Neg Hx     Social History   Tobacco Use   Smoking status: Former Smoker    Packs/day: 0.25    Years: 8.00    Pack years: 2.00    Types: Cigarettes    Quit date: 08/09/1986    Years since quitting: 33.4   Smokeless tobacco: Never Used  Vaping Use   Vaping Use: Never used  Substance Use Topics   Alcohol use: No   Drug use: No    Current Outpatient Medications  Medication Sig Dispense Refill   amLODipine (NORVASC) 5 MG tablet TAKE 1 TABLET BY MOUTH EVERY DAY.MAX ON INSURANCE (Patient taking differently: Take 5 mg by mouth daily. ) 30 tablet 5   amphetamine-dextroamphetamine (ADDERALL) 20 MG tablet Take 20 mg by mouth daily.     clonazePAM (KLONOPIN) 1 MG tablet TAKE 1 TABLET BY MOUTH THREE TIMES A DAY AS NEEDED FOR ANXIETY (Patient taking differently: Take 1 mg by mouth 3 (three) times daily as needed for anxiety. ) 90 tablet 0   clotrimazole-betamethasone (LOTRISONE) cream Apply 1 application topically 2 (two) times daily. 30 g 0   FOLIC ACID PO Take by mouth.     gabapentin (NEURONTIN) 300 MG capsule Take 300 mg by mouth 4 (four) times daily.     hydrOXYzine (ATARAX/VISTARIL) 10 MG tablet Take 10 mg by mouth 3 (three) times daily.     mirtazapine (REMERON) 45  MG tablet Take 45 mg by mouth at bedtime.      Multiple Vitamin (MULTIVITAMIN) capsule Take 1 capsule by mouth daily.     Multiple Vitamins-Minerals (AIRBORNE PO) Take by mouth.     omeprazole (PRILOSEC) 40 MG capsule Take 1 capsule (40 mg total) by mouth in the morning and at bedtime for 60 doses. Please cancel refills on old rx for omeprazole 40 mg once a day 60 capsule 2   PARoxetine (PAXIL) 40 MG tablet TAKE 1 TABLET BY MOUTH IN  THE MORNING (Patient taking differently: Take 40 mg by mouth in the morning. ) 90 tablet 3   Probiotic Product (PROBIOTIC DAILY PO) Take 1 capsule by mouth daily.      traZODone (DESYREL) 50 MG tablet Take 50-150 mg by mouth at bedtime as  needed for sleep.      No current facility-administered medications for this visit.    Allergies  Allergen Reactions   Compazine [Prochlorperazine]     hyperactivity   Morphine And Related Hives and Itching   Trintellix [Vortioxetine] Nausea And Vomiting    Review of Systems:  Constitutional: Denies fever, chills, diaphoresis, appetite change and fatigue.  HEENT: Denies photophobia, eye pain, redness, hearing loss, ear pain, congestion, sore throat, rhinorrhea, sneezing, mouth sores, neck pain, neck stiffness and tinnitus.   Respiratory: Denies SOB, DOE, cough, chest tightness,  and wheezing.   Cardiovascular: Denies chest pain, palpitations and leg swelling.  Genitourinary: Denies dysuria, urgency, frequency, hematuria, flank pain and difficulty urinating.  Musculoskeletal: Denies myalgias, back pain, joint swelling, arthralgias and gait problem.  Skin: No rash.  Neurological: Denies dizziness, seizures, syncope, weakness, light-headedness, numbness and headaches.  Hematological: Denies adenopathy. Easy bruising, personal or family bleeding history  Psychiatric/Behavioral: Has anxiety or depression     Physical Exam:    BP 120/82    Pulse 73    Ht 5' 1.5" (1.562 m)    Wt 148 lb (67.1 kg)    LMP 12/28/2011     BMI 27.51 kg/m  Wt Readings from Last 3 Encounters:  01/23/20 148 lb (67.1 kg)  01/18/20 152 lb (68.9 kg)  01/08/20 144 lb (65.3 kg)   Constitutional:  Well-developed, in no acute distress. Psychiatric: Normal mood and affect. Behavior is normal. HEENT: Pupils normal.  Conjunctivae are normal. No scleral icterus. Cardiovascular: Normal rate, regular rhythm. No edema Pulmonary/chest: Effort normal and breath sounds normal. No wheezing, rales or rhonchi. Abdominal: Soft, nondistended.  Has epigastric tenderness.  Bowel sounds active throughout. There are no masses palpable. No hepatomegaly. Rectal:  defered Neurological: Alert and oriented to person place and time. Skin: Skin is warm and dry. No rashes noted.  Data Reviewed: I have personally reviewed following labs and imaging studies  CBC: CBC Latest Ref Rng & Units 01/18/2020 01/10/2020 01/09/2020  WBC 3.8 - 10.8 Thousand/uL 9.9 6.8 8.3  Hemoglobin 11.7 - 15.5 g/dL 13.8 12.5 13.0  Hematocrit 35 - 45 % 42.3 37.2 39.2  Platelets 140 - 400 Thousand/uL 138(L) 83(L) 92(L)    CMP: CMP Latest Ref Rng & Units 01/18/2020 01/10/2020 01/09/2020  Glucose 65 - 99 mg/dL 96 79 79  BUN 7 - 25 mg/dL 15 12 23   Creatinine 0.50 - 0.99 mg/dL 0.97 0.78 1.28(H)  Sodium 135 - 146 mmol/L 142 144 139  Potassium 3.5 - 5.3 mmol/L 4.1 3.6 3.8  Chloride 98 - 110 mmol/L 105 114(H) 111  CO2 20 - 32 mmol/L 30 22 19(L)  Calcium 8.6 - 10.4 mg/dL 9.4 8.5(L) 8.4(L)  Total Protein 6.1 - 8.1 g/dL 6.3 - 6.0(L)  Total Bilirubin 0.2 - 1.2 mg/dL 0.2 - 0.5  Alkaline Phos 38 - 126 U/L - - 68  AST 10 - 35 U/L 17 - 27  ALT 6 - 29 U/L 15 - 16    GFR: Estimated Creatinine Clearance: 48.6 mL/min (by C-G formula based on SCr of 0.97 mg/dL). Liver Function Tests: Recent Labs  Lab 01/18/20 1129  AST 17  ALT 15  BILITOT 0.2  PROT 6.3      Carmell Austria, MD 01/23/2020, 9:58 AM  Cc: [name], NP

## 2020-01-23 NOTE — Patient Instructions (Addendum)
If you are age 69 or older, your body mass index should be between 23-30. Your Body mass index is 27.51 kg/m. If this is out of the aforementioned range listed, please consider follow up with your Primary Care Provider.  If you are age 66 or younger, your body mass index should be between 19-25. Your Body mass index is 27.51 kg/m. If this is out of the aformentioned range listed, please consider follow up with your Primary Care Provider.   Please go to the lab at [facility] ([place] You will need to go to level "B", you do not need an appointment for this. Hours available are 7:30 am - 4:30 pm.   We have sent the following medications to your pharmacy for you to pick up at your convenience: Zofran   You have been scheduled for an endoscopy. Please follow written instructions given to you at your visit today. If you use inhalers (even only as needed), please bring them with you on the day of your procedure.   Thank you,  Dr. Jackquline Denmark

## 2020-01-24 ENCOUNTER — Ambulatory Visit (AMBULATORY_SURGERY_CENTER): Payer: 59 | Admitting: Gastroenterology

## 2020-01-24 ENCOUNTER — Encounter: Payer: Self-pay | Admitting: Gastroenterology

## 2020-01-24 ENCOUNTER — Other Ambulatory Visit: Payer: Self-pay

## 2020-01-24 VITALS — BP 125/64 | HR 54 | Temp 97.1°F | Resp 13 | Ht 61.0 in | Wt 148.0 lb

## 2020-01-24 DIAGNOSIS — R140 Abdominal distension (gaseous): Secondary | ICD-10-CM | POA: Diagnosis not present

## 2020-01-24 DIAGNOSIS — K2950 Unspecified chronic gastritis without bleeding: Secondary | ICD-10-CM | POA: Diagnosis not present

## 2020-01-24 DIAGNOSIS — K2970 Gastritis, unspecified, without bleeding: Secondary | ICD-10-CM | POA: Diagnosis not present

## 2020-01-24 DIAGNOSIS — K449 Diaphragmatic hernia without obstruction or gangrene: Secondary | ICD-10-CM | POA: Diagnosis not present

## 2020-01-24 DIAGNOSIS — R112 Nausea with vomiting, unspecified: Secondary | ICD-10-CM

## 2020-01-24 DIAGNOSIS — K219 Gastro-esophageal reflux disease without esophagitis: Secondary | ICD-10-CM | POA: Diagnosis not present

## 2020-01-24 MED ORDER — SODIUM CHLORIDE 0.9 % IV SOLN: 500.0000 mL | Freq: Once | INTRAVENOUS | Status: DC

## 2020-01-24 MED ORDER — PANTOPRAZOLE SODIUM 40 MG PO TBEC: 40.0000 mg | DELAYED_RELEASE_TABLET | Freq: Every day | ORAL | 6 refills | Status: DC

## 2020-01-24 NOTE — Progress Notes (Signed)
Called to room to assist during endoscopic procedure.  Patient ID and intended procedure confirmed with present staff. Received instructions for my participation in the procedure from the performing physician.  

## 2020-01-24 NOTE — Progress Notes (Signed)
A and O x3. Report to RN. Tolerated MAC anesthesia well.Teeth unchanged after procedure. 

## 2020-01-24 NOTE — Patient Instructions (Signed)
HANDOUTS PROVIDED ON: HIATAL HERNIA & GASTRITIS  The biopsies taken today have been sent for pathology.  The results can take 1-3 weeks to receive.   You may resume your previous diet and medication schedule.  A prescription has been sent to your pharmacy for pantoprazole (Protonix) 40 mg which you will take about 30 minutes before your first meal each day.    You have been provided with the contrast that is necessary for the CT scan that the center will call you about with specifics of the appointment and how to prepare.  Thank you for allowing [place] to care for you today!!!   YOU HAD AN ENDOSCOPIC PROCEDURE TODAY AT [place]:   Refer to the procedure report that was given to you for any specific questions about what was found during the examination.  If the procedure report does not answer your questions, please call your gastroenterologist to clarify.  If you requested that your care partner not be given the details of your procedure findings, then the procedure report has been included in a sealed envelope for you to review at your convenience later.  YOU SHOULD EXPECT: Some feelings of bloating in the abdomen. Passage of more gas than usual.  Walking can help get rid of the air that was put into your GI tract during the procedure and reduce the bloating.   Please Note:  You might notice some irritation and congestion in your nose or some drainage.  This is from the oxygen used during your procedure.  There is no need for concern and it should clear up in a day or so.  SYMPTOMS TO REPORT IMMEDIATELY:   Following upper endoscopy (EGD)  Vomiting of blood or coffee ground material  New chest pain or pain under the shoulder blades  Painful or persistently difficult swallowing  New shortness of breath  Fever of 100F or higher  Black, tarry-looking stools  For urgent or emergent issues, a gastroenterologist can be reached at any hour by calling [phone]. Do not  use MyChart messaging for urgent concerns.    DIET:  We do recommend a small meal at first, but then you may proceed to your regular diet.  Drink plenty of fluids but you should avoid alcoholic beverages for 24 hours.  ACTIVITY:  You should plan to take it easy for the rest of today and you should NOT DRIVE or use heavy machinery until tomorrow (because of the sedation medicines used during the test).    FOLLOW UP: Our staff will call the number listed on your records Monday morning between 7:15 am and 8:15 am to check on you and address any questions or concerns that you may have regarding the information given to you following your procedure. If we do not reach you, we will leave a message.  We will attempt to reach you two times.  During this call, we will ask if you have developed any symptoms of COVID 19. If you develop any symptoms (ie: fever, flu-like symptoms, shortness of breath, cough etc.) before then, please call [phone].  If you test positive for Covid 19 in the 2 weeks post procedure, please call and report this information to [place].    If any biopsies were taken you will be contacted by phone or by letter within the next 1-3 weeks.  Please call us at [phone] if you have not heard about the biopsies in 3 weeks.    SIGNATURES/CONFIDENTIALITY: You and/or your care  partner have signed paperwork which will be entered into your electronic medical record.  These signatures attest to the fact that that the information above on your After Visit Summary has been reviewed and is understood.  Full responsibility of the confidentiality of this discharge information lies with you and/or your care-partner.

## 2020-01-24 NOTE — Op Note (Signed)
[name] Procedure Date: 01/24/2020 2:00 PM MRN: [id number] Endoscopist: Jackquline Denmark , MD Age: 69 Referring MD:  Date of Birth: 1950/09/30 Gender: Female Account #: [id number] Procedure:                Upper GI endoscopy Indications:              Persistent nausea/vomiting of unknown cause Medicines:                Monitored Anesthesia Care Procedure:                Pre-Anesthesia Assessment:                           - Prior to the procedure, a History and Physical                            was performed, and patient medications and                            allergies were reviewed. The patient's tolerance of                            previous anesthesia was also reviewed. The risks                            and benefits of the procedure and the sedation                            options and risks were discussed with the patient.                            All questions were answered, and informed consent                            was obtained. Prior Anticoagulants: The patient has                            taken no previous anticoagulant or antiplatelet                            agents. ASA Grade Assessment: II - A patient with                            mild systemic disease. After reviewing the risks                            and benefits, the patient was deemed in                            satisfactory condition to undergo the procedure.                           After obtaining informed consent, the endoscope was  passed under direct vision. Throughout the                            procedure, the patient's blood pressure, pulse, and                            oxygen saturations were monitored continuously. The                            Endoscope was introduced through the mouth, and                            advanced to the second part of duodenum. The upper                            GI endoscopy  was accomplished without difficulty.                            The patient tolerated the procedure well. Scope In: Scope Out: Findings:                 The examined esophagus was normal with well-defined                            Z-line at 33 cm. Examined by NBI. No varices.                           A small hiatal hernia was present.                           Diffuse moderate inflammation characterized by                            erythema was found in the gastric fundus, in the                            gastric body, in the gastric antrum and in the                            prepyloric region of the stomach. Biopsies were                            taken with a cold forceps for histology. Could                            represent early portal hypertensive gastropathy. No                            fundal varices. The retroflexed examination of the                            cardia revealed GE junction flap to be [facility] grade  III.                           The examined duodenum was normal. Biopsies for                            histology were taken with a cold forceps for                            evaluation of celiac disease. Complications:            No immediate complications. Estimated Blood Loss:     Estimated blood loss: none. Impression:               - Small hiatal hernia.                           - Moderate gastritis. Recommendation:           - Patient has a contact number available for                            emergencies. The signs and symptoms of potential                            delayed complications were discussed with the                            patient. Return to normal activities tomorrow.                            Written discharge instructions were provided to the                            patient.                           - Resume previous diet.                           - Start Protonix 40 mg p.o. once a day, #30, 6                             refills.                           - Await pathology results.                           - Perform CT scan (computed tomography) of the                            abdomen/pelvis with contrast in 2 weeks.                           - The findings and recommendations were discussed  with the patient's family.                           - FU thereafter in [facility] or with myself. Jackquline Denmark, MD 01/24/2020 2:20:35 PM This report has been signed electronically.

## 2020-01-28 ENCOUNTER — Telehealth: Payer: Self-pay | Admitting: *Deleted

## 2020-01-28 ENCOUNTER — Telehealth: Payer: Self-pay

## 2020-01-28 ENCOUNTER — Telehealth: Payer: Self-pay | Admitting: Gastroenterology

## 2020-01-28 NOTE — Telephone Encounter (Signed)
  Follow up Call-  Call back number 01/24/2020  Post procedure Call Back phone  # [phone]  Permission to leave phone message Yes  Some recent data might be hidden     1st follow up call made.  NALM

## 2020-01-28 NOTE — Telephone Encounter (Signed)
  Follow up Call-  Call back number 01/24/2020  Post procedure Call Back phone  # [phone]  Permission to leave phone message Yes  Some recent data might be hidden     Patient questions:  Do you have a fever, pain , or abdominal swelling? No. Pain Score  0 *  Have you tolerated food without any problems? Yes.    Have you been able to return to your normal activities? Yes.    Do you have any questions about your discharge instructions: Diet   No. Medications  No. Follow up visit  No.  Do you have questions or concerns about your Care? No.  Actions: * If pain score is 4 or above: No action needed, pain <4.  1. Have you developed a fever since your procedure? no  2.   Have you had an respiratory symptoms (SOB or cough) since your procedure? no  3.   Have you tested positive for COVID 19 since your procedure no  4.   Have you had any family members/close contacts diagnosed with the COVID 19 since your procedure?  no   If yes to any of these questions please route to Joylene John, RN and Joella Prince, RN

## 2020-01-29 ENCOUNTER — Other Ambulatory Visit: Payer: Self-pay | Admitting: Gastroenterology

## 2020-01-29 DIAGNOSIS — R112 Nausea with vomiting, unspecified: Secondary | ICD-10-CM

## 2020-01-29 DIAGNOSIS — R109 Unspecified abdominal pain: Secondary | ICD-10-CM

## 2020-01-29 NOTE — Telephone Encounter (Signed)
Patient is schedules for a CT abd/pelvis with contrast on 02/05/20 at 9 am [facility] all questions answered.Patient voiced understanding.

## 2020-02-03 ENCOUNTER — Encounter: Payer: Self-pay | Admitting: Gastroenterology

## 2020-02-04 ENCOUNTER — Telehealth: Payer: Self-pay | Admitting: Gastroenterology

## 2020-02-04 NOTE — Telephone Encounter (Signed)
Spoke to patient. She was given the number to call to reschedule her CT when she felt she was ready. Patient voiced understanding.

## 2020-02-04 NOTE — Telephone Encounter (Signed)
Pt is requesting a call back from a nurse to reschedule her CT scan, pt is afraid the contrast will go right through her since she is experiencing diarrhea.

## 2020-02-05 ENCOUNTER — Ambulatory Visit (HOSPITAL_BASED_OUTPATIENT_CLINIC_OR_DEPARTMENT_OTHER): Payer: 59

## 2020-02-05 ENCOUNTER — Other Ambulatory Visit: Payer: Self-pay | Admitting: Family

## 2020-02-06 ENCOUNTER — Other Ambulatory Visit: Payer: Self-pay | Admitting: Gastroenterology

## 2020-02-15 ENCOUNTER — Telehealth: Payer: Self-pay | Admitting: Family

## 2020-02-15 ENCOUNTER — Telehealth: Payer: Self-pay | Admitting: Gastroenterology

## 2020-02-15 NOTE — Telephone Encounter (Signed)
See mychart.  

## 2020-02-18 NOTE — Telephone Encounter (Signed)
I have called and left a message for patient to return my call.  

## 2020-02-18 NOTE — Telephone Encounter (Signed)
Patient is returning your call.  

## 2020-02-19 NOTE — Telephone Encounter (Signed)
I have called and spoke to patient and gave her the number to reschedule her CT Scan

## 2020-02-19 NOTE — Telephone Encounter (Signed)
I have left a message for patient to return call

## 2020-02-24 ENCOUNTER — Other Ambulatory Visit: Payer: Self-pay | Admitting: Family

## 2020-02-25 ENCOUNTER — Other Ambulatory Visit: Payer: Self-pay | Admitting: Gastroenterology

## 2020-02-25 DIAGNOSIS — R109 Unspecified abdominal pain: Secondary | ICD-10-CM

## 2020-02-25 NOTE — Telephone Encounter (Signed)
Spoke to patient. She will have labs done prior to her CT scan.

## 2020-02-25 NOTE — Telephone Encounter (Signed)
Patient has rescheduled her CT for 03/07/20 and is requiring to repeat labs

## 2020-02-28 ENCOUNTER — Other Ambulatory Visit: Payer: 59

## 2020-02-28 DIAGNOSIS — R109 Unspecified abdominal pain: Secondary | ICD-10-CM

## 2020-02-29 LAB — BUN/CREATININE RATIO
BUN: 15 mg/dL (ref 7–25)
Creat: 0.93 mg/dL (ref 0.50–0.99)
GFR, Est African American: 73 mL/min/{1.73_m2} (ref >=60)
GFR, Est Non African American: 63 mL/min/{1.73_m2} (ref >=60)

## 2020-03-07 ENCOUNTER — Encounter (HOSPITAL_BASED_OUTPATIENT_CLINIC_OR_DEPARTMENT_OTHER): Payer: Self-pay

## 2020-03-07 ENCOUNTER — Ambulatory Visit (HOSPITAL_BASED_OUTPATIENT_CLINIC_OR_DEPARTMENT_OTHER): Payer: Medicare Other

## 2020-03-10 ENCOUNTER — Encounter: Payer: 59 | Admitting: Family Medicine

## 2020-03-10 ENCOUNTER — Other Ambulatory Visit: Payer: Self-pay

## 2020-03-11 ENCOUNTER — Other Ambulatory Visit: Payer: Self-pay

## 2020-03-11 ENCOUNTER — Telehealth (INDEPENDENT_AMBULATORY_CARE_PROVIDER_SITE_OTHER): Payer: 59 | Admitting: Family Medicine

## 2020-03-11 DIAGNOSIS — J0100 Acute maxillary sinusitis, unspecified: Secondary | ICD-10-CM | POA: Diagnosis not present

## 2020-03-11 DIAGNOSIS — I10 Essential (primary) hypertension: Secondary | ICD-10-CM

## 2020-03-11 MED ORDER — METHYLPREDNISOLONE 4 MG PO TABS: ORAL_TABLET | ORAL | 0 refills | Status: DC

## 2020-03-11 MED ORDER — ALBUTEROL SULFATE HFA 108 (90 BASE) MCG/ACT IN AERS: 2.0000 | INHALATION_SPRAY | Freq: Four times a day (QID) | RESPIRATORY_TRACT | 0 refills | Status: DC | PRN

## 2020-03-11 MED ORDER — AMOXICILLIN-POT CLAVULANATE 875-125 MG PO TABS: 1.0000 | ORAL_TABLET | Freq: Two times a day (BID) | ORAL | 0 refills | Status: DC

## 2020-03-11 NOTE — Assessment & Plan Note (Signed)
Monitor and report concerns, no changes to meds. Encouraged heart healthy diet such as the DASH diet and exercise as tolerated.  

## 2020-03-11 NOTE — Patient Instructions (Signed)
Pulse oximeter for checking for oxygen want oxygen in 90s if drops to 80s you need to be seen.

## 2020-03-12 NOTE — Progress Notes (Signed)
Virtual Visit via Phone Note  I connected with [name] on 03/11/20 at  9:40 AM EST by a phone enabled telemedicine application and verified that I am speaking with the correct person using two identifiers.  Location: Patient: home, patient and provider in visit Provider: office   I discussed the limitations of evaluation and management by telemedicine and the availability of in person appointments. The patient expressed understanding and agreed to proceed. S Chism, CMA was able to get the patient set up on phone visit after being unable to set up a video visit.     Subjective:    Patient ID: [name], female    DOB: 10-19-1950, 69 y.o.   MRN: [id number]  Chief Complaint  Patient presents with   Sinus Problem   Cough    HPI Patient is in today for evaluation of worsening respiratory symptoms. She started over a week ago with head congestion, sinus pressure and rhinorrhea. Now she has chest congestion, sore throat and malaise. Denies CP/palp/fevers/GI or GU c/o. Taking meds as prescribed  Past Medical History:  Diagnosis Date   Allergy    seasonal   Anxiety    Arachnoiditis    Cataract    Depression    Elevated liver enzymes    Environmental allergies    GERD (gastroesophageal reflux disease)    H/O being hospitalized 03/2012   x1 week for nausea   History of chicken pox    Hyperlipidemia    Hypertension    Migraine with typical aura    resolved years ago   Nausea    chronic nausea    Neuromuscular disorder [facility])     Past Surgical History:  Procedure Laterality Date   ABDOMINOPLASTY  07/11/09   EUS N/A 05/05/2012   Procedure: UPPER ENDOSCOPIC ULTRASOUND (EUS) LINEAR;  Surgeon: Beryle Beams, MD;  Location: [facility] ENDOSCOPY;  Service: Endoscopy;  Laterality: N/A;   LAPAROSCOPIC CHOLECYSTECTOMY  2010   NASAL SINUS SURGERY     "Has had 2-3 surgeries over the years"    Family History  Problem Relation Age of Onset   Alzheimer's disease  Mother    Hypertension Mother    Migraines Mother    Hypertension Father    Other Father        ?carotid artery aneurysm   Cancer Brother        mouth and throat   Colon cancer Neg Hx     Social History   Socioeconomic History   Marital status: Married    Spouse name: Not on file   Number of children: Not on file   Years of education: Not on file   Highest education level: Not on file  Occupational History   Not on file  Tobacco Use   Smoking status: Former Smoker    Packs/day: 0.25    Years: 8.00    Pack years: 2.00    Types: Cigarettes    Quit date: 08/09/1986    Years since quitting: 33.6   Smokeless tobacco: Never Used  Vaping Use   Vaping Use: Never used  Substance and Sexual Activity   Alcohol use: No   Drug use: No   Sexual activity: Not Currently    Partners: Male    Birth control/protection: Other-see comments    Comment: husband had vasectomy.   Other Topics Concern   Not on file  Social History Narrative   2 step daughters- 68 and 69   She worked as an Web designer.  Enjoys puzzles, counted cross stitch.   Completed 1 year of college   4 cats   Social Determinants of Health   Financial Resource Strain: Low Risk    Difficulty of Paying Living Expenses: Not hard at all  Food Insecurity: No Food Insecurity   Worried About Charity fundraiser in the Last Year: Never true   Arboriculturist in the Last Year: Never true  Transportation Needs: No Transportation Needs   Lack of Transportation (Medical): No   Lack of Transportation (Non-Medical): No  Physical Activity: Not on file  Stress: Not on file  Social Connections: Not on file  Intimate Partner Violence: Not on file    Outpatient Medications Prior to Visit  Medication Sig Dispense Refill   amLODipine (NORVASC) 5 MG tablet TAKE 1 TABLET BY MOUTH EVERY DAY.MAX ON INSURANCE (Patient taking differently: Take 5 mg by mouth daily.) 30 tablet 5    amphetamine-dextroamphetamine (ADDERALL) 20 MG tablet Take 20 mg by mouth daily.     clonazePAM (KLONOPIN) 1 MG tablet TAKE 1 TABLET BY MOUTH THREE TIMES A DAY AS NEEDED FOR ANXIETY (Patient taking differently: Take 1 mg by mouth 3 (three) times daily as needed for anxiety.) 90 tablet 0   clotrimazole-betamethasone (LOTRISONE) cream Apply 1 application topically 2 (two) times daily. 30 g 0   gabapentin (NEURONTIN) 300 MG capsule Take 300 mg by mouth 4 (four) times daily.     hydrOXYzine (ATARAX/VISTARIL) 10 MG tablet Take 10 mg by mouth 3 (three) times daily.     mirtazapine (REMERON) 45 MG tablet Take 45 mg by mouth at bedtime.      Multiple Vitamin (MULTIVITAMIN) capsule Take 1 capsule by mouth daily.     Multiple Vitamins-Minerals (AIRBORNE PO) Take by mouth.     omeprazole (PRILOSEC) 40 MG capsule TAKE 1 CAPSULE BY MOUTH IN THE MORNING AND AT BEDTIME FOR 60 DOSES. 60 capsule 2   ondansetron (ZOFRAN-ODT) 4 MG disintegrating tablet TAKE 1 TABLET (4 MG TOTAL) BY MOUTH EVERY 6 (SIX) HOURS AS NEEDED FOR NAUSEA OR VOMITING 30 tablet 0   pantoprazole (PROTONIX) 40 MG tablet Take 1 tablet (40 mg total) by mouth daily. 30 tablet 6   PARoxetine (PAXIL) 40 MG tablet TAKE 1 TABLET BY MOUTH IN  THE MORNING (Patient taking differently: Take 40 mg by mouth in the morning.) 90 tablet 3   Probiotic Product (PROBIOTIC DAILY PO) Take 1 capsule by mouth daily.      traZODone (DESYREL) 50 MG tablet Take 50-150 mg by mouth at bedtime as needed for sleep.      No facility-administered medications prior to visit.    Allergies  Allergen Reactions   Compazine [Prochlorperazine]     hyperactivity   Morphine And Related Hives and Itching   Trintellix [Vortioxetine] Nausea And Vomiting    Review of Systems  Constitutional: Positive for malaise/fatigue. Negative for chills and fever.  HENT: Positive for congestion, sinus pain and sore throat.   Eyes: Negative for blurred vision.  Respiratory:  Positive for cough, sputum production and shortness of breath.   Cardiovascular: Negative for chest pain, palpitations and leg swelling.  Gastrointestinal: Negative for abdominal pain, blood in stool and nausea.  Genitourinary: Negative for dysuria and frequency.  Musculoskeletal: Positive for myalgias. Negative for falls.  Skin: Negative for rash.  Neurological: Negative for dizziness, loss of consciousness and headaches.  Endo/Heme/Allergies: Negative for environmental allergies.  Psychiatric/Behavioral: Negative for depression. The patient is not nervous/anxious.  Objective:    Physical Exam unable to obtain via phone visit  LMP 12/28/2011  Wt Readings from Last 3 Encounters:  01/24/20 148 lb (67.1 kg)  01/23/20 148 lb (67.1 kg)  01/18/20 152 lb (68.9 kg)    Diabetic Foot Exam - Simple   No data filed    Lab Results  Component Value Date   WBC 9.9 01/18/2020   HGB 13.8 01/18/2020   HCT 42.3 01/18/2020   PLT 138 (L) 01/18/2020   GLUCOSE 96 01/18/2020   CHOL 203 (H) 06/13/2015   TRIG 189.0 (H) 06/13/2015   HDL 49.10 06/13/2015   LDLCALC 116 (H) 06/13/2015   ALT 15 01/18/2020   AST 17 01/18/2020   NA 142 01/18/2020   K 4.1 01/18/2020   CL 105 01/18/2020   CREATININE 0.93 02/28/2020   BUN 15 02/28/2020   CO2 30 01/18/2020    No results found for: TSH Lab Results  Component Value Date   WBC 9.9 01/18/2020   HGB 13.8 01/18/2020   HCT 42.3 01/18/2020   MCV 91.4 01/18/2020   PLT 138 (L) 01/18/2020   Lab Results  Component Value Date   NA 142 01/18/2020   K 4.1 01/18/2020   CO2 30 01/18/2020   GLUCOSE 96 01/18/2020   BUN 15 02/28/2020   CREATININE 0.93 02/28/2020   BILITOT 0.2 01/18/2020   ALKPHOS 68 01/09/2020   AST 17 01/18/2020   ALT 15 01/18/2020   PROT 6.3 01/18/2020   ALBUMIN 3.4 (L) 01/09/2020   CALCIUM 9.4 01/18/2020   ANIONGAP 8 01/10/2020   GFR 63.79 07/31/2019   Lab Results  Component Value Date   CHOL 203 (H) 06/13/2015   Lab  Results  Component Value Date   HDL 49.10 06/13/2015   Lab Results  Component Value Date   LDLCALC 116 (H) 06/13/2015   Lab Results  Component Value Date   TRIG 189.0 (H) 06/13/2015   Lab Results  Component Value Date   CHOLHDL 4 06/13/2015   No results found for: HGBA1C     Assessment & Plan:   Problem List Items Addressed This Visit    Hypertension    Monitor and report concerns, no changes to meds. Encouraged heart healthy diet such as the DASH diet and exercise as tolerated.       Sinusitis    Started in her head and is now moving into her chest with cough, congrestion, fatigue. She has been sick a few weeks and is worsening. Started on Augmentin, Albuterol and she will have Medrol if she worsens. Encouraged increased rest and hydration, add probiotics, zinc such as Coldeze or Xicam. Treat fevers as needed      Relevant Medications   amoxicillin-clavulanate (AUGMENTIN) 875-125 MG tablet   methylPREDNISolone (MEDROL) 4 MG tablet      I have changed Santiago Glad Shober's amoxicillin-clavulanate. I am also having her start on methylPREDNISolone. Additionally, I am having her maintain her clonazePAM, amphetamine-dextroamphetamine, traZODone, mirtazapine, clotrimazole-betamethasone, Probiotic Product (PROBIOTIC DAILY PO), multivitamin, Multiple Vitamins-Minerals (AIRBORNE PO), amLODipine, PARoxetine, hydrOXYzine, gabapentin, pantoprazole, ondansetron, omeprazole, and albuterol.  Meds ordered this encounter  Medications   amoxicillin-clavulanate (AUGMENTIN) 875-125 MG tablet    Sig: Take 1 tablet by mouth 2 (two) times daily.    Dispense:  20 tablet    Refill:  0   albuterol (VENTOLIN HFA) 108 (90 Base) MCG/ACT inhaler    Sig: Inhale 2 puffs into the lungs every 6 (six) hours as needed for wheezing or shortness of  breath.    Dispense:  18 g    Refill:  0   methylPREDNISolone (MEDROL) 4 MG tablet    Sig: 5 tab po qd X 1d then 4 tab po qd X 1d then 3 tab po qd X 1d then 2 tab  po qd then 1 tab po qd    Dispense:  15 tablet    Refill:  0    I discussed the assessment and treatment plan with the patient. The patient was provided an opportunity to ask questions and all were answered. The patient agreed with the plan and demonstrated an understanding of the instructions.   The patient was advised to call back or seek an in-person evaluation if the symptoms worsen or if the condition fails to improve as anticipated.  I provided 15 minutes of non-face-to-face time during this encounter.   Penni Homans, MD

## 2020-03-12 NOTE — Assessment & Plan Note (Signed)
Started in her head and is now moving into her chest with cough, congrestion, fatigue. She has been sick a few weeks and is worsening. Started on Augmentin, Albuterol and she will have Medrol if she worsens. Encouraged increased rest and hydration, add probiotics, zinc such as Coldeze or Xicam. Treat fevers as needed

## 2020-03-14 ENCOUNTER — Other Ambulatory Visit: Payer: Self-pay | Admitting: Family Medicine

## 2020-03-17 NOTE — Progress Notes (Signed)
This encounter was created in error - please disregard.

## 2020-03-20 ENCOUNTER — Other Ambulatory Visit: Payer: Self-pay | Admitting: Gastroenterology

## 2020-04-03 ENCOUNTER — Encounter: Payer: Self-pay | Admitting: Family

## 2020-04-03 ENCOUNTER — Other Ambulatory Visit: Payer: Self-pay | Admitting: Gastroenterology

## 2020-04-13 ENCOUNTER — Other Ambulatory Visit: Payer: Self-pay | Admitting: Family

## 2020-05-05 ENCOUNTER — Encounter: Payer: Self-pay | Admitting: Family

## 2020-05-06 ENCOUNTER — Telehealth (INDEPENDENT_AMBULATORY_CARE_PROVIDER_SITE_OTHER): Payer: Medicare Other | Admitting: Family

## 2020-05-06 ENCOUNTER — Other Ambulatory Visit: Payer: Self-pay

## 2020-05-06 DIAGNOSIS — J0100 Acute maxillary sinusitis, unspecified: Secondary | ICD-10-CM | POA: Diagnosis not present

## 2020-05-06 MED ORDER — FLUCONAZOLE 150 MG PO TABS: 150.0000 mg | ORAL_TABLET | Freq: Once | ORAL | 0 refills | Status: DC | PRN

## 2020-05-06 MED ORDER — AMOXICILLIN-POT CLAVULANATE 875-125 MG PO TABS: 1.0000 | ORAL_TABLET | Freq: Two times a day (BID) | ORAL | 0 refills | Status: DC

## 2020-05-06 NOTE — Progress Notes (Signed)
Virtual Visit via Telephone Note  I connected with [name] on 05/06/20 at  5:20 PM EST by telephone and verified that I am speaking with the correct person using two identifiers.  Location: Patient: home Provider: work   I discussed the limitations, risks, security and privacy concerns of performing an evaluation and management service by telephone and the availability of in person appointments. I also discussed with the patient that there may be a patient responsible charge related to this service. The patient expressed understanding and agreed to proceed.   History of Present Illness:  Pt reports sinus symptoms began 3 weeks ago. Reports that she took some OTC meds and seemed to improve.  She is taking dayquil/nyquil and coridin. Reports + pressure on forehead and cheeks. Reports + tenderness to palpation of these areas.  Reports "low grade fever" on and off, not ever day.  She did take a covid test about 2 weeks ago which was negative.    Observations/Objective:   Gen: Awake, alert, no acute distress Resp: Breathing sounds even and non-labored ENT: some voice hoarseness Psych: calm/pleasant demeanor Neuro: Alert and Oriented x 3, + facial symmetry, speech is clear.   Assessment and Plan:  Acute sinusitis- recommended that patient begin augmentin 875mg  bid x 10 days. Diflucan rx provided empirically for yeast infection. She is advised to call if symptoms worsen or if symptoms do not improve. Pt verbalizes understanding.    Follow Up Instructions:    I discussed the assessment and treatment plan with the patient. The patient was provided an opportunity to ask questions and all were answered. The patient agreed with the plan and demonstrated an understanding of the instructions.   The patient was advised to call back or seek an in-person evaluation if the symptoms worsen or if the condition fails to improve as anticipated.  I provided 11 minutes of non-face-to-face time  during this encounter.   Nance Pear, NP

## 2020-05-06 NOTE — Telephone Encounter (Signed)
Per [name] patient needs virtual visit. Patient is not answering the telephone.  MyChart message sent to her, advised to call for virtual visit today.

## 2020-05-06 NOTE — Telephone Encounter (Signed)
lvm to schedule appt.  

## 2020-05-22 DIAGNOSIS — L814 Other melanin hyperpigmentation: Secondary | ICD-10-CM | POA: Diagnosis not present

## 2020-05-22 DIAGNOSIS — L603 Nail dystrophy: Secondary | ICD-10-CM | POA: Diagnosis not present

## 2020-05-22 DIAGNOSIS — L821 Other seborrheic keratosis: Secondary | ICD-10-CM | POA: Diagnosis not present

## 2020-05-22 DIAGNOSIS — L578 Other skin changes due to chronic exposure to nonionizing radiation: Secondary | ICD-10-CM | POA: Diagnosis not present

## 2020-05-22 DIAGNOSIS — D225 Melanocytic nevi of trunk: Secondary | ICD-10-CM | POA: Diagnosis not present

## 2020-05-26 DIAGNOSIS — E079 Disorder of thyroid, unspecified: Secondary | ICD-10-CM | POA: Diagnosis not present

## 2020-05-26 DIAGNOSIS — E8881 Metabolic syndrome: Secondary | ICD-10-CM | POA: Diagnosis not present

## 2020-05-26 DIAGNOSIS — E559 Vitamin D deficiency, unspecified: Secondary | ICD-10-CM | POA: Diagnosis not present

## 2020-05-26 DIAGNOSIS — D649 Anemia, unspecified: Secondary | ICD-10-CM | POA: Diagnosis not present

## 2020-05-26 DIAGNOSIS — B882 Other arthropod infestations: Secondary | ICD-10-CM | POA: Diagnosis not present

## 2020-05-26 DIAGNOSIS — M359 Systemic involvement of connective tissue, unspecified: Secondary | ICD-10-CM | POA: Diagnosis not present

## 2020-05-26 DIAGNOSIS — Z139 Encounter for screening, unspecified: Secondary | ICD-10-CM | POA: Diagnosis not present

## 2020-05-26 DIAGNOSIS — R5382 Chronic fatigue, unspecified: Secondary | ICD-10-CM | POA: Diagnosis not present

## 2020-05-26 DIAGNOSIS — E569 Vitamin deficiency, unspecified: Secondary | ICD-10-CM | POA: Diagnosis not present

## 2020-06-28 ENCOUNTER — Encounter: Payer: Self-pay | Admitting: Family

## 2020-06-30 MED ORDER — PAROXETINE HCL 40 MG PO TABS: 40.0000 mg | ORAL_TABLET | Freq: Every morning | ORAL | 3 refills | Status: DC

## 2020-07-15 ENCOUNTER — Telehealth: Payer: Self-pay | Admitting: Family

## 2020-07-15 NOTE — Telephone Encounter (Signed)
Could you please contact pt and confirm that she is not taking escitalopram and paroxetine?  She should only be taking paroxetine but her insurance notified me that she has filled both. Tks.

## 2020-07-15 NOTE — Telephone Encounter (Signed)
Patient reports she is taking both medications, patient was advised to discontinue escitalopram and continue paroxetine.

## 2020-07-28 ENCOUNTER — Encounter: Payer: Self-pay | Admitting: Family

## 2020-08-02 ENCOUNTER — Other Ambulatory Visit: Payer: Self-pay | Admitting: Family

## 2020-08-03 ENCOUNTER — Encounter: Payer: Self-pay | Admitting: Family

## 2020-08-05 ENCOUNTER — Encounter: Payer: Self-pay | Admitting: Family

## 2020-08-05 ENCOUNTER — Ambulatory Visit (INDEPENDENT_AMBULATORY_CARE_PROVIDER_SITE_OTHER): Payer: Medicare Other | Admitting: Family

## 2020-08-05 ENCOUNTER — Other Ambulatory Visit: Payer: Self-pay

## 2020-08-05 ENCOUNTER — Ambulatory Visit (HOSPITAL_BASED_OUTPATIENT_CLINIC_OR_DEPARTMENT_OTHER)
Admission: RE | Admit: 2020-08-05 | Discharge: 2020-08-05 | Disposition: A | Discharge status: Home / Self Care | Payer: Medicare Other | Source: Ambulatory Visit | Attending: Family | Admitting: Family

## 2020-08-05 VITALS — BP 120/74 | HR 70 | Temp 98.4°F | Resp 16 | Ht 61.5 in | Wt 160.0 lb

## 2020-08-05 DIAGNOSIS — K429 Umbilical hernia without obstruction or gangrene: Secondary | ICD-10-CM

## 2020-08-05 DIAGNOSIS — M160 Bilateral primary osteoarthritis of hip: Secondary | ICD-10-CM | POA: Diagnosis not present

## 2020-08-05 DIAGNOSIS — M47816 Spondylosis without myelopathy or radiculopathy, lumbar region: Secondary | ICD-10-CM | POA: Diagnosis not present

## 2020-08-05 NOTE — Patient Instructions (Signed)
Please complete x-ray on the first floor. You should be contacted about your referral to the surgeon. Please go to the ER if you develop severe abdominal pain or if you are unable to keep down food/liquid.

## 2020-08-05 NOTE — Assessment & Plan Note (Signed)
Will obtain KUB to rule out obstruction.  Refer to surgeon to discuss elective repair. Pt is advised to go to ER if severe/worsening abdominal pain or if unable to keep down food/liquid.

## 2020-08-05 NOTE — Telephone Encounter (Signed)
Spoke to pt. She is able to urinate, just needing to strain more "it comes in spurts." Notes abdomen is usually more distended at night. Recommended that she start miralax tonight, as well as either a fleets enema or a dulcolax suppository. She is advised to go to the ER if pain worsens or if she is unable to urinate. Pt verbalizes understanding.

## 2020-08-05 NOTE — Progress Notes (Signed)
Subjective:   By signing my name below, I, [name], attest that this documentation has been prepared under the direction and in the presence of Ashley Alar NP. 08/05/2020      Patient ID: [name], female    DOB: [birth year], 70 y.o.   MRN: [id number]  No chief complaint on file.   HPI Patient is in today for a office visit. She is complaining of her hernia symptoms getting worsening for the past 3 weeks. She has pain right below her umbilicus. She has bloating, trouble urinating, bowel movement issues, vomiting, and digestion issues.    Past Medical History:  Diagnosis Date  . Allergy    seasonal  . Anxiety   . Arachnoiditis   . Cataract   . Depression   . Elevated liver enzymes   . Environmental allergies   . GERD (gastroesophageal reflux disease)   . H/O being hospitalized 03/2012   x1 week for nausea  . History of chicken pox   . Hyperlipidemia   . Hypertension   . Migraine with typical aura    resolved years ago  . Nausea    chronic nausea   . Neuromuscular disorder [facility])     Past Surgical History:  Procedure Laterality Date  . ABDOMINOPLASTY  07/11/09  . EUS N/A 05/05/2012   Procedure: UPPER ENDOSCOPIC ULTRASOUND (EUS) LINEAR;  Surgeon: Beryle Beams, MD;  Location: [facility];  Service: Endoscopy;  Laterality: N/A;  . LAPAROSCOPIC CHOLECYSTECTOMY  2010  . NASAL SINUS SURGERY     "Has had 2-3 surgeries over the years"    Family History  Problem Relation Age of Onset  . Alzheimer's disease Mother   . Hypertension Mother   . Migraines Mother   . Hypertension Father   . Other Father        ?carotid artery aneurysm  . Cancer Brother        mouth and throat  . Colon cancer Neg Hx     Social History   Socioeconomic History  . Marital status: Married    Spouse name: Not on file  . Number of children: Not on file  . Years of education: Not on file  . Highest education level: Not on file  Occupational History  . Not on file  Tobacco  Use  . Smoking status: Former Smoker    Packs/day: 0.25    Years: 8.00    Pack years: 2.00    Types: Cigarettes    Quit date: 08/09/1986    Years since quitting: 34.0  . Smokeless tobacco: Never Used  Vaping Use  . Vaping Use: Never used  Substance and Sexual Activity  . Alcohol use: No  . Drug use: No  . Sexual activity: Not Currently    Partners: Male    Birth control/protection: Other-see comments    Comment: husband had vasectomy.   Other Topics Concern  . Not on file  Social History Narrative   2 step daughters- 20 and 24   She worked as an Web designer.   Enjoys puzzles, counted cross stitch.   Completed 1 year of college   4 cats   Social Determinants of Health   Financial Resource Strain: Low Risk   . Difficulty of Paying Living Expenses: Not hard at all  Food Insecurity: No Food Insecurity  . Worried About Charity fundraiser in the Last Year: Never true  . Ran Out of Food in the Last Year: Never true  Transportation Needs:  No Transportation Needs  . Lack of Transportation (Medical): No  . Lack of Transportation (Non-Medical): No  Physical Activity: Not on file  Stress: Not on file  Social Connections: Not on file  Intimate Partner Violence: Not on file    Outpatient Medications Prior to Visit  Medication Sig Dispense Refill  . albuterol (VENTOLIN HFA) 108 (90 Base) MCG/ACT inhaler TAKE 2 PUFFS BY MOUTH EVERY 6 HOURS AS NEEDED FOR WHEEZE OR SHORTNESS OF BREATH 8.5 each 1  . amLODipine (NORVASC) 5 MG tablet Take 1 tablet (5 mg total) by mouth daily. 30 tablet 5  . amoxicillin-clavulanate (AUGMENTIN) 875-125 MG tablet Take 1 tablet by mouth 2 (two) times daily. 20 tablet 0  . amphetamine-dextroamphetamine (ADDERALL) 20 MG tablet Take 20 mg by mouth daily.    . clonazePAM (KLONOPIN) 1 MG tablet TAKE 1 TABLET BY MOUTH THREE TIMES A DAY AS NEEDED FOR ANXIETY (Patient taking differently: Take 1 mg by mouth 3 (three) times daily as needed for anxiety.) 90  tablet 0  . clotrimazole-betamethasone (LOTRISONE) cream Apply 1 application topically 2 (two) times daily. 30 g 0  . fluconazole (DIFLUCAN) 150 MG tablet Take 1 tablet (150 mg total) by mouth once as needed for up to 1 dose (yeast infection). 1 tablet 0  . gabapentin (NEURONTIN) 300 MG capsule Take 300 mg by mouth 4 (four) times daily.    . hydrOXYzine (ATARAX/VISTARIL) 10 MG tablet Take 10 mg by mouth 3 (three) times daily.    . mirtazapine (REMERON) 45 MG tablet Take 45 mg by mouth at bedtime.     . Multiple Vitamin (MULTIVITAMIN) capsule Take 1 capsule by mouth daily.    . Multiple Vitamins-Minerals (AIRBORNE PO) Take by mouth.    Marland Kitchen omeprazole (PRILOSEC) 40 MG capsule Take 1 capsule (40 mg total) by mouth in the morning and at bedtime. 60 capsule 3  . ondansetron (ZOFRAN-ODT) 4 MG disintegrating tablet TAKE 1 TABLET (4 MG TOTAL) BY MOUTH EVERY 6 (SIX) HOURS AS NEEDED FOR NAUSEA OR VOMITING 30 tablet 0  . PARoxetine (PAXIL) 40 MG tablet Take 1 tablet (40 mg total) by mouth every morning. 90 tablet 3  . Probiotic Product (PROBIOTIC DAILY PO) Take 1 capsule by mouth daily.     . traZODone (DESYREL) 50 MG tablet Take 50-150 mg by mouth at bedtime as needed for sleep.      No facility-administered medications prior to visit.    Allergies  Allergen Reactions  . Compazine [Prochlorperazine]     hyperactivity  . Morphine And Related Hives and Itching  . Trintellix [Vortioxetine] Nausea And Vomiting    Review of Systems  Gastrointestinal: Positive for abdominal pain and vomiting.       (+)Issues during bowel movements. (+)Bloating (+)Pain below her umbilicus  Genitourinary:       (+)Issues during urinating.       Objective:    Physical Exam Constitutional:      Appearance: Normal appearance.  HENT:     Head: Normocephalic and atraumatic.     Right Ear: External ear normal.     Left Ear: External ear normal.  Eyes:     Extraocular Movements: Extraocular movements intact.      Pupils: Pupils are equal, round, and reactive to light.  Cardiovascular:     Rate and Rhythm: Normal rate and regular rhythm.     Pulses: Normal pulses.     Heart sounds: Normal heart sounds. No murmur heard. No friction rub. No gallop.  Pulmonary:     Effort: Pulmonary effort is normal. No respiratory distress.     Breath sounds: Normal breath sounds. No stridor. No wheezing, rhonchi or rales.  Abdominal:     General: Bowel sounds are decreased. Distension: Minimal distension.     Palpations: Abdomen is soft.     Hernia: A hernia is present.     Comments: Hypoactive bowel sounds. Small umbilical hernia which is tender to palpation  Skin:    General: Skin is warm and dry.  Neurological:     Mental Status: She is alert and oriented to person, place, and time.  Psychiatric:        Behavior: Behavior normal.     LMP 12/28/2011  Wt Readings from Last 3 Encounters:  01/24/20 148 lb (67.1 kg)  01/23/20 148 lb (67.1 kg)  01/18/20 152 lb (68.9 kg)    Diabetic Foot Exam - Simple   No data filed    Lab Results  Component Value Date   WBC 9.9 01/18/2020   HGB 13.8 01/18/2020   HCT 42.3 01/18/2020   PLT 138 (L) 01/18/2020   GLUCOSE 96 01/18/2020   CHOL 203 (H) 06/13/2015   TRIG 189.0 (H) 06/13/2015   HDL 49.10 06/13/2015   LDLCALC 116 (H) 06/13/2015   ALT 15 01/18/2020   AST 17 01/18/2020   NA 142 01/18/2020   K 4.1 01/18/2020   CL 105 01/18/2020   CREATININE 0.93 02/28/2020   BUN 15 02/28/2020   CO2 30 01/18/2020    No results found for: TSH Lab Results  Component Value Date   WBC 9.9 01/18/2020   HGB 13.8 01/18/2020   HCT 42.3 01/18/2020   MCV 91.4 01/18/2020   PLT 138 (L) 01/18/2020   Lab Results  Component Value Date   NA 142 01/18/2020   K 4.1 01/18/2020   CO2 30 01/18/2020   GLUCOSE 96 01/18/2020   BUN 15 02/28/2020   CREATININE 0.93 02/28/2020   BILITOT 0.2 01/18/2020   ALKPHOS 68 01/09/2020   AST 17 01/18/2020   ALT 15 01/18/2020   PROT 6.3  01/18/2020   ALBUMIN 3.4 (L) 01/09/2020   CALCIUM 9.4 01/18/2020   ANIONGAP 8 01/10/2020   GFR 63.79 07/31/2019   Lab Results  Component Value Date   CHOL 203 (H) 06/13/2015   Lab Results  Component Value Date   HDL 49.10 06/13/2015   Lab Results  Component Value Date   LDLCALC 116 (H) 06/13/2015   Lab Results  Component Value Date   TRIG 189.0 (H) 06/13/2015   Lab Results  Component Value Date   CHOLHDL 4 06/13/2015   No results found for: HGBA1C     Assessment & Plan:   Problem List Items Addressed This Visit   None      No orders of the defined types were placed in this encounter.   I, Ashley Alar NP, personally preformed the services described in this documentation.  All medical record entries made by the scribe were at my direction and in my presence.  I have reviewed the chart and discharge instructions (if applicable) and agree that the record reflects my personal performance and is accurate and complete. 08/05/2020  I,[name],acting as a Education administrator for Nance Pear, NP.,have documented all relevant documentation on the behalf of Nance Pear, NP,as directed by  Nance Pear, NP while in the presence of Nance Pear, NP.    Ashley [employer]

## 2020-08-13 ENCOUNTER — Encounter: Payer: Self-pay | Admitting: Family

## 2020-08-14 NOTE — Telephone Encounter (Signed)
Rod Holler, can you please check status of this referral?

## 2020-08-31 ENCOUNTER — Other Ambulatory Visit: Payer: Self-pay | Admitting: Gastroenterology

## 2020-09-01 ENCOUNTER — Encounter: Payer: Self-pay | Admitting: Family

## 2020-09-02 MED ORDER — GABAPENTIN 100 MG PO CAPS: 100.0000 mg | ORAL_CAPSULE | Freq: Three times a day (TID) | ORAL | 3 refills | Status: DC

## 2020-09-03 ENCOUNTER — Other Ambulatory Visit: Payer: Self-pay | Admitting: Surgery

## 2020-09-03 DIAGNOSIS — K420 Umbilical hernia with obstruction, without gangrene: Secondary | ICD-10-CM | POA: Diagnosis not present

## 2020-09-07 ENCOUNTER — Other Ambulatory Visit: Payer: Self-pay | Admitting: Gastroenterology

## 2020-09-07 ENCOUNTER — Other Ambulatory Visit: Payer: Self-pay | Admitting: Family

## 2020-09-08 ENCOUNTER — Telehealth: Payer: Self-pay | Admitting: Gastroenterology

## 2020-09-08 NOTE — Telephone Encounter (Signed)
Do you want me to refill Zofran for this patient? Last ov was 01/23/2020 and procedure 01/24/2020

## 2020-09-08 NOTE — Telephone Encounter (Signed)
Inbound call from patient. Need medication refill for ondansetron 4mg . [facility] [address] in [place]. Only have 4 pills left.

## 2020-09-09 MED ORDER — ONDANSETRON 4 MG PO TBDP: 4.0000 mg | ORAL_TABLET | Freq: Three times a day (TID) | ORAL | 2 refills | Status: DC | PRN

## 2020-09-09 NOTE — Telephone Encounter (Signed)
Lets do Zofran 4 mg ODT every 6-8 hours as needed, #20, 2 refills RG

## 2020-09-09 NOTE — Telephone Encounter (Signed)
Medication sent.

## 2020-09-10 NOTE — Telephone Encounter (Signed)
Reviewed the message in detail -Okay to start Metamucil. -Lets proceed with CT Abdo/pelvis with p.o. and IV contrast.  May need to check BMP before -Proceed with umbilical hernia surgery as planned. -Lets switch omeprazole to Protonix 40 mg p.o. once a day #30, 11 refills   RG

## 2020-09-11 ENCOUNTER — Other Ambulatory Visit: Payer: Self-pay

## 2020-09-11 DIAGNOSIS — R109 Unspecified abdominal pain: Secondary | ICD-10-CM

## 2020-09-11 MED ORDER — PANTOPRAZOLE SODIUM 40 MG PO TBEC: 40.0000 mg | DELAYED_RELEASE_TABLET | Freq: Every day | ORAL | 11 refills | Status: DC

## 2020-09-19 ENCOUNTER — Ambulatory Visit (HOSPITAL_COMMUNITY): Payer: Medicare Other

## 2020-09-25 ENCOUNTER — Other Ambulatory Visit: Payer: Self-pay

## 2020-09-25 ENCOUNTER — Ambulatory Visit (HOSPITAL_COMMUNITY)
Admission: RE | Admit: 2020-09-25 | Discharge: 2020-09-25 | Disposition: A | Discharge status: Home / Self Care | Payer: Medicare Other | Source: Ambulatory Visit | Attending: Gastroenterology | Admitting: Gastroenterology

## 2020-09-25 DIAGNOSIS — R109 Unspecified abdominal pain: Secondary | ICD-10-CM | POA: Diagnosis not present

## 2020-09-25 DIAGNOSIS — K760 Fatty (change of) liver, not elsewhere classified: Secondary | ICD-10-CM | POA: Diagnosis not present

## 2020-09-25 LAB — POCT I-STAT CREATININE: Creatinine, Ser: 0.8 mg/dL (ref 0.44–1.00)

## 2020-09-25 MED ORDER — IOHEXOL 300 MG/ML  SOLN
100.0000 mL | Freq: Once | INTRAMUSCULAR | Status: AC | PRN
  Administered 2020-09-25: 100 mL via INTRAVENOUS

## 2020-10-01 ENCOUNTER — Encounter (HOSPITAL_BASED_OUTPATIENT_CLINIC_OR_DEPARTMENT_OTHER): Payer: Self-pay | Admitting: Surgery

## 2020-10-01 ENCOUNTER — Other Ambulatory Visit: Payer: Self-pay

## 2020-10-03 ENCOUNTER — Encounter (HOSPITAL_BASED_OUTPATIENT_CLINIC_OR_DEPARTMENT_OTHER)
Admission: RE | Admit: 2020-10-03 | Discharge: 2020-10-03 | Disposition: A | Discharge status: Home / Self Care | Payer: Medicare Other | Source: Ambulatory Visit | Attending: Surgery | Admitting: Surgery

## 2020-10-03 DIAGNOSIS — Z01810 Encounter for preprocedural cardiovascular examination: Secondary | ICD-10-CM | POA: Insufficient documentation

## 2020-10-03 DIAGNOSIS — I10 Essential (primary) hypertension: Secondary | ICD-10-CM | POA: Diagnosis not present

## 2020-10-03 MED ORDER — CHLORHEXIDINE GLUCONATE CLOTH 2 % EX PADS: 6.0000 | MEDICATED_PAD | Freq: Once | CUTANEOUS | Status: DC

## 2020-10-03 MED ORDER — ENSURE PRE-SURGERY PO LIQD
296.0000 mL | Freq: Once | ORAL | Status: DC
Start: 2020-10-04 — End: 2020-10-06

## 2020-10-03 NOTE — Progress Notes (Signed)
Let pt a detailed VM message stating that she needed to come to [facility] today for an EKG, CHG soap, and Ensure drink for her upcoming surgery on Monday 10/06/20.

## 2020-10-03 NOTE — Progress Notes (Signed)

## 2020-10-05 NOTE — H&P (Signed)
[name]  Location: [facility] Surgery Patient #: [id number] DOB: 1950/12/01 Married / Language: English / Race: White Female   History of Present Illness  The patient is a 71 year old female who presents with an umbilical hernia.  Chief complain: Umbilical hernia  This is a 70 year old female who is referred here for evaluation of an umbilical hernia.  She reports that she has had for several years but over the last several weeks it has gotten larger and causing increasing sharp discomfort at the hernia.  She has no nausea or vomiting.  She has no obstructive symptoms.  The pain has improved over the last several days.   Past Surgical History Cataract Surgery   Left. Gallbladder Surgery - Laparoscopic    Diagnostic Studies History PM) Colonoscopy   5-10 years ago Mammogram   1-3 years ago  Allergies  Morphine Sulfate (Bulk) *ANALGESICS - OPIOID*   Compazine *ANTIPSYCHOTICS/ANTIMANIC AGENTS*   Allergies Reconciled    Medication History Altamese Cabal, CMA;  clonazePAM  (1MG  Tablet, Oral) Active. amLODIPine Besylate  (5MG  Tablet, Oral) Active. Mirtazapine  (45MG  Tablet, Oral) Active. Adderall  (7.5MG  Tablet, Oral) Active. traZODone HCl  (50MG  Tablet, Oral) Active. clonazePAM  (0.5MG  Tablet, Oral) Active. hydrOXYzine Pamoate  (50MG  Capsule, Oral) Active. Centrum Silver  (Oral) Active. Medications Reconciled   Social History Altamese Cabal, CMA;  Alcohol use   Remotely quit alcohol use. Caffeine use   Tea. No drug use   Tobacco use   Former smoker.  Family History Altamese Cabal, [place];  Alcohol Abuse   Father. Cancer   Brother. Depression   Mother. Hypertension   Father, Mother. Migraine Headache   Mother.  Pregnancy / Birth History Altamese Cabal, [place]; Age at menarche   82 years. Age of menopause   69-50 Contraceptive History   Oral contraceptives. Gravida   0 Para   0  Other Problems Altamese Cabal, [place];  Anxiety Disorder   Gastroesophageal Reflux  Disease      Review of Systems [facility] Jeannene Patella CMA;  General Present- Fatigue. Not Present- Appetite Loss, Chills, Fever, Night Sweats, Weight Gain and Weight Loss. Skin Present- Dryness. Not Present- Change in Wart/Mole, Hives, Jaundice, New Lesions, Non-Healing Wounds, Rash and Ulcer. HEENT Present- Ringing in the Ears, Sinus Pain and Sore Throat. Not Present- Earache, Hearing Loss, Hoarseness, Nose Bleed, Oral Ulcers, Seasonal Allergies, Visual Disturbances, Wears glasses/contact lenses and Yellow Eyes. Respiratory Present- Difficulty Breathing. Not Present- Bloody sputum, Chronic Cough, Snoring and Wheezing. Cardiovascular Present- Shortness of Breath. Not Present- Chest Pain, Difficulty Breathing Lying Down, Leg Cramps, Palpitations, Rapid Heart Rate and Swelling of Extremities. Gastrointestinal Present- Abdominal Pain, Bloating and Constipation. Not Present- Bloody Stool, Change in Bowel Habits, Chronic diarrhea, Difficulty Swallowing, Excessive gas, Gets full quickly at meals, Hemorrhoids, Indigestion, Nausea, Rectal Pain and Vomiting. Musculoskeletal Present- Back Pain and Joint Pain. Not Present- Joint Stiffness, Muscle Pain, Muscle Weakness and Swelling of Extremities. Psychiatric Present- Anxiety, Depression and Frequent crying. Not Present- Bipolar, Change in Sleep Pattern and Fearful. Hematology Not Present- Blood Thinners, Easy Bruising, Excessive bleeding, Gland problems, HIV and Persistent Infections.  Vitals   Weight: 166.25 lb   Height: 63 in  Body Surface Area: 1.79 m   Body Mass Index: 29.45 kg/m   Temp.: 97.4 F    Pulse: 68 (Regular)    Resp.: 18 (Unlabored)    P.OX: 95% (Room air) BP: 135/70(Sitting, Left Arm, Standard)     Physical Exam  The physical exam findings are as  follows: Note:  She appears well exam.  Her abdomen is soft. She has multiple well-healed incisions. There is an incarcerated hernia above and just to the right of the umbilicus which I suspect  contains incarcerated omentum. There is no peritonitis  Lungs clear  CV  RRR  Neuro grossly intact    Assessment & Plan  UMBILICAL HERNIA, INCARCERATED (K42.0)  Impression: I have reviewed her notes in the electronic medical records. She does have an chronically incarcerated umbilical hernia which I suspect contains omentum. Repair with possible mesh is recommended. I discussed abdominal wall anatomy with the patient and her husband. We discussed the reasons for hernia repair. I explained the surgical procedure in detail. We discussed the risk which includes but is not limited to bleeding, infection, use of mesh, recurrence, cardiopulmonary issues, etc. She understands and wishes to proceed with surgery as soon as possible.

## 2020-10-06 ENCOUNTER — Encounter (HOSPITAL_BASED_OUTPATIENT_CLINIC_OR_DEPARTMENT_OTHER)
Admission: RE | Disposition: A | Discharge status: Home / Self Care | Payer: Self-pay | Source: Home / Self Care | Attending: Surgery

## 2020-10-06 ENCOUNTER — Ambulatory Visit (HOSPITAL_BASED_OUTPATIENT_CLINIC_OR_DEPARTMENT_OTHER): Payer: Medicare Other | Admitting: Anesthesiology

## 2020-10-06 ENCOUNTER — Encounter (HOSPITAL_BASED_OUTPATIENT_CLINIC_OR_DEPARTMENT_OTHER): Payer: Self-pay | Admitting: Surgery

## 2020-10-06 ENCOUNTER — Other Ambulatory Visit: Payer: Self-pay

## 2020-10-06 ENCOUNTER — Ambulatory Visit (HOSPITAL_BASED_OUTPATIENT_CLINIC_OR_DEPARTMENT_OTHER)
Admission: RE | Admit: 2020-10-06 | Discharge: 2020-10-06 | Disposition: A | Discharge status: Home / Self Care | Payer: Medicare Other | Attending: Surgery | Admitting: Surgery

## 2020-10-06 DIAGNOSIS — K429 Umbilical hernia without obstruction or gangrene: Secondary | ICD-10-CM | POA: Diagnosis not present

## 2020-10-06 DIAGNOSIS — N179 Acute kidney failure, unspecified: Secondary | ICD-10-CM | POA: Diagnosis not present

## 2020-10-06 DIAGNOSIS — I10 Essential (primary) hypertension: Secondary | ICD-10-CM | POA: Diagnosis not present

## 2020-10-06 DIAGNOSIS — Z87891 Personal history of nicotine dependence: Secondary | ICD-10-CM | POA: Diagnosis not present

## 2020-10-06 DIAGNOSIS — K420 Umbilical hernia with obstruction, without gangrene: Secondary | ICD-10-CM | POA: Diagnosis present

## 2020-10-06 DIAGNOSIS — Z79899 Other long term (current) drug therapy: Secondary | ICD-10-CM | POA: Diagnosis not present

## 2020-10-06 DIAGNOSIS — Z886 Allergy status to analgesic agent status: Secondary | ICD-10-CM | POA: Diagnosis not present

## 2020-10-06 DIAGNOSIS — Z888 Allergy status to other drugs, medicaments and biological substances status: Secondary | ICD-10-CM | POA: Insufficient documentation

## 2020-10-06 DIAGNOSIS — Z885 Allergy status to narcotic agent status: Secondary | ICD-10-CM | POA: Diagnosis not present

## 2020-10-06 HISTORY — PX: UMBILICAL HERNIA REPAIR: SHX196

## 2020-10-06 SURGERY — REPAIR, HERNIA, UMBILICAL, ADULT
Anesthesia: General | Site: Abdomen

## 2020-10-06 MED ORDER — KETOROLAC TROMETHAMINE 30 MG/ML IJ SOLN
INTRAMUSCULAR | Status: AC
  Filled 2020-10-06: qty 1

## 2020-10-06 MED ORDER — MIDAZOLAM HCL 2 MG/2ML IJ SOLN
INTRAMUSCULAR | Status: AC
  Filled 2020-10-06: qty 2

## 2020-10-06 MED ORDER — ONDANSETRON HCL 4 MG/2ML IJ SOLN
INTRAMUSCULAR | Status: AC
  Filled 2020-10-06: qty 2

## 2020-10-06 MED ORDER — FENTANYL CITRATE (PF) 100 MCG/2ML IJ SOLN
INTRAMUSCULAR | Status: AC
  Filled 2020-10-06: qty 2

## 2020-10-06 MED ORDER — PROPOFOL 10 MG/ML IV BOLUS
INTRAVENOUS | Status: AC
  Filled 2020-10-06: qty 20

## 2020-10-06 MED ORDER — TRAMADOL HCL 50 MG PO TABS: 50.0000 mg | ORAL_TABLET | Freq: Four times a day (QID) | ORAL | 0 refills | Status: DC | PRN

## 2020-10-06 MED ORDER — OXYCODONE HCL 5 MG PO TABS: 5.0000 mg | ORAL_TABLET | Freq: Once | ORAL | Status: DC | PRN

## 2020-10-06 MED ORDER — ONDANSETRON HCL 4 MG/2ML IJ SOLN: 4.0000 mg | Freq: Once | INTRAMUSCULAR | Status: DC | PRN

## 2020-10-06 MED ORDER — OXYCODONE HCL 5 MG/5ML PO SOLN: 5.0000 mg | Freq: Once | ORAL | Status: DC | PRN

## 2020-10-06 MED ORDER — ONDANSETRON HCL 4 MG/2ML IJ SOLN
INTRAMUSCULAR | Status: DC | PRN
  Administered 2020-10-06: 4 mg via INTRAVENOUS

## 2020-10-06 MED ORDER — AMISULPRIDE (ANTIEMETIC) 5 MG/2ML IV SOLN: 10.0000 mg | Freq: Once | INTRAVENOUS | Status: DC | PRN

## 2020-10-06 MED ORDER — ACETAMINOPHEN 500 MG PO TABS
ORAL_TABLET | ORAL | Status: AC
  Filled 2020-10-06: qty 2

## 2020-10-06 MED ORDER — ACETAMINOPHEN 500 MG PO TABS
1000.0000 mg | ORAL_TABLET | ORAL | Status: AC
  Administered 2020-10-06: 1000 mg via ORAL

## 2020-10-06 MED ORDER — FENTANYL CITRATE (PF) 100 MCG/2ML IJ SOLN
INTRAMUSCULAR | Status: DC | PRN
  Administered 2020-10-06: 50 ug via INTRAVENOUS

## 2020-10-06 MED ORDER — LIDOCAINE HCL (PF) 2 % IJ SOLN
INTRAMUSCULAR | Status: AC
  Filled 2020-10-06: qty 5

## 2020-10-06 MED ORDER — BUPIVACAINE-EPINEPHRINE 0.5% -1:200000 IJ SOLN
INTRAMUSCULAR | Status: DC | PRN
  Administered 2020-10-06: 20 mL

## 2020-10-06 MED ORDER — LACTATED RINGERS IV SOLN: INTRAVENOUS | Status: DC

## 2020-10-06 MED ORDER — FENTANYL CITRATE (PF) 100 MCG/2ML IJ SOLN
25.0000 ug | INTRAMUSCULAR | Status: DC | PRN
  Administered 2020-10-06: 25 ug via INTRAVENOUS

## 2020-10-06 MED ORDER — CEFAZOLIN SODIUM-DEXTROSE 2-4 GM/100ML-% IV SOLN
2.0000 g | INTRAVENOUS | Status: AC
  Administered 2020-10-06: 2 g via INTRAVENOUS

## 2020-10-06 MED ORDER — KETOROLAC TROMETHAMINE 30 MG/ML IJ SOLN
INTRAMUSCULAR | Status: DC | PRN
  Administered 2020-10-06: 30 mg via INTRAVENOUS

## 2020-10-06 MED ORDER — CEFAZOLIN SODIUM-DEXTROSE 2-4 GM/100ML-% IV SOLN
INTRAVENOUS | Status: AC
  Filled 2020-10-06: qty 100

## 2020-10-06 MED ORDER — LIDOCAINE HCL (CARDIAC) PF 100 MG/5ML IV SOSY
PREFILLED_SYRINGE | INTRAVENOUS | Status: DC | PRN
  Administered 2020-10-06: 50 mg via INTRAVENOUS

## 2020-10-06 MED ORDER — MIDAZOLAM HCL 5 MG/5ML IJ SOLN
INTRAMUSCULAR | Status: DC | PRN
  Administered 2020-10-06: 1 mg via INTRAVENOUS

## 2020-10-06 MED ORDER — PROPOFOL 10 MG/ML IV BOLUS
INTRAVENOUS | Status: DC | PRN
  Administered 2020-10-06: 150 mg via INTRAVENOUS

## 2020-10-06 SURGICAL SUPPLY — 45 items
ADH SKN CLS APL DERMABOND .7 (GAUZE/BANDAGES/DRESSINGS) ×1
APL PRP STRL LF DISP 70% ISPRP (MISCELLANEOUS) ×1
BLADE CLIPPER SURG (BLADE) IMPLANT
BLADE SURG 15 STRL LF DISP TIS (BLADE) ×1 IMPLANT
BLADE SURG 15 STRL SS (BLADE) ×2
CANISTER SUCT 1200ML W/VALVE (MISCELLANEOUS) IMPLANT
CHLORAPREP W/TINT 26 (MISCELLANEOUS) ×2 IMPLANT
COVER BACK TABLE 60X90IN (DRAPES) ×2 IMPLANT
COVER MAYO STAND STRL (DRAPES) ×2 IMPLANT
DECANTER SPIKE VIAL GLASS SM (MISCELLANEOUS) IMPLANT
DERMABOND ADVANCED (GAUZE/BANDAGES/DRESSINGS) ×1
DERMABOND ADVANCED .7 DNX12 (GAUZE/BANDAGES/DRESSINGS) ×2 IMPLANT
DRAPE LAPAROTOMY 100X72 PEDS (DRAPES) ×2 IMPLANT
DRAPE UTILITY XL STRL (DRAPES) ×2 IMPLANT
DRSG TEGADERM 2-3/8X2-3/4 SM (GAUZE/BANDAGES/DRESSINGS) IMPLANT
ELECT REM PT RETURN 9FT ADLT (ELECTROSURGICAL) ×2
ELECTRODE REM PT RTRN 9FT ADLT (ELECTROSURGICAL) ×1 IMPLANT
GLOVE SURG POLYISO LF SZ8 (GLOVE) ×1 IMPLANT
GLOVE SURG SIGNA 7.5 PF LTX (GLOVE) ×2 IMPLANT
GLOVE SURG UNDER POLY LF SZ7 (GLOVE) ×1 IMPLANT
GOWN STRL REUS W/ TWL LRG LVL3 (GOWN DISPOSABLE) ×1 IMPLANT
GOWN STRL REUS W/ TWL XL LVL3 (GOWN DISPOSABLE) ×1 IMPLANT
GOWN STRL REUS W/TWL 2XL LVL3 (GOWN DISPOSABLE) ×1 IMPLANT
GOWN STRL REUS W/TWL LRG LVL3 (GOWN DISPOSABLE)
GOWN STRL REUS W/TWL XL LVL3 (GOWN DISPOSABLE) ×2
NDL HYPO 25X1 1.5 SAFETY (NEEDLE) ×1 IMPLANT
NEEDLE HYPO 25X1 1.5 SAFETY (NEEDLE) ×2 IMPLANT
NS IRRIG 1000ML POUR BTL (IV SOLUTION) ×1 IMPLANT
PACK BASIN DAY SURGERY FS (CUSTOM PROCEDURE TRAY) ×2 IMPLANT
PENCIL SMOKE EVACUATOR (MISCELLANEOUS) ×2 IMPLANT
SLEEVE SCD COMPRESS KNEE MED (STOCKING) ×2 IMPLANT
SPONGE T-LAP 4X18 ~~LOC~~+RFID (SPONGE) ×1 IMPLANT
SUT ETHIBOND 0 MO6 C/R (SUTURE) ×1 IMPLANT
SUT MNCRL AB 4-0 PS2 18 (SUTURE) ×2 IMPLANT
SUT NOVA 0 T19/GS 22DT (SUTURE) IMPLANT
SUT NOVA NAB DX-16 0-1 5-0 T12 (SUTURE) IMPLANT
SUT NOVA NAB GS-21 1 T12 (SUTURE) IMPLANT
SUT VIC AB 2-0 SH 27 (SUTURE)
SUT VIC AB 2-0 SH 27XBRD (SUTURE) IMPLANT
SUT VIC AB 3-0 SH 27 (SUTURE) ×2
SUT VIC AB 3-0 SH 27X BRD (SUTURE) ×1 IMPLANT
SYR CONTROL 10ML LL (SYRINGE) ×2 IMPLANT
TOWEL GREEN STERILE FF (TOWEL DISPOSABLE) ×2 IMPLANT
TUBE CONNECTING 20X1/4 (TUBING) ×1 IMPLANT
YANKAUER SUCT BULB TIP NO VENT (SUCTIONS) ×1 IMPLANT

## 2020-10-06 NOTE — Anesthesia Postprocedure Evaluation (Signed)
Anesthesia Post Note  Patient: [name]  Procedure(s) Performed: UMBILICAL HERNIA REPAIR (Abdomen)     Patient location during evaluation: PACU Anesthesia Type: General Level of consciousness: awake and alert Pain management: pain level controlled Vital Signs Assessment: post-procedure vital signs reviewed and stable Respiratory status: spontaneous breathing, nonlabored ventilation and respiratory function stable Cardiovascular status: blood pressure returned to baseline and stable Postop Assessment: no apparent nausea or vomiting Anesthetic complications: no   No notable events documented.  Last Vitals:  Vitals:   10/06/20 1345 10/06/20 1407  BP: 120/62 (!) 143/81  Pulse: 67 68  Resp: 15 16  Temp:  36.4 C  SpO2: 96% 96%    Last Pain:  Vitals:   10/06/20 1407  TempSrc:   PainSc: 2                  Lidia Collum

## 2020-10-06 NOTE — Anesthesia Preprocedure Evaluation (Signed)
Anesthesia Evaluation  Patient identified by MRN, date of birth, ID band Patient awake    Reviewed: Allergy & Precautions, NPO status , Patient's Chart, lab work & pertinent test results  History of Anesthesia Complications Negative for: history of anesthetic complications  Airway Mallampati: III  TM Distance: >3 FB Neck ROM: Full    Dental  (+) Teeth Intact, Dental Advisory Given   Pulmonary neg pulmonary ROS, former smoker,    Pulmonary exam normal        Cardiovascular hypertension, Pt. on medications Normal cardiovascular exam     Neuro/Psych Anxiety Depression negative neurological ROS     GI/Hepatic Neg liver ROS, GERD  ,  Endo/Other  negative endocrine ROS  Renal/GU negative Renal ROS  negative genitourinary   Musculoskeletal negative musculoskeletal ROS (+)   Abdominal   Peds  Hematology negative hematology ROS (+)   Anesthesia Other Findings   Reproductive/Obstetrics                            Anesthesia Physical Anesthesia Plan  ASA: 2  Anesthesia Plan: General   Post-op Pain Management:    Induction: Intravenous  PONV Risk Score and Plan: 3 and Ondansetron, Dexamethasone, Treatment may vary due to age or medical condition and Midazolam  Airway Management Planned: LMA and Oral ETT  Additional Equipment: None  Intra-op Plan:   Post-operative Plan: Extubation in OR  Informed Consent: I have reviewed the patients History and Physical, chart, labs and discussed the procedure including the risks, benefits and alternatives for the proposed anesthesia with the patient or authorized representative who has indicated his/her understanding and acceptance.     Dental advisory given  Plan Discussed with:   Anesthesia Plan Comments:         Anesthesia Quick Evaluation

## 2020-10-06 NOTE — Transfer of Care (Signed)
Immediate Anesthesia Transfer of Care Note  Patient: [name]  Procedure(s) Performed: UMBILICAL HERNIA REPAIR (Abdomen)  Patient Location: PACU  Anesthesia Type:General  Level of Consciousness: awake, alert  and oriented  Airway & Oxygen Therapy: Patient Spontanous Breathing and Patient connected to nasal cannula oxygen  Post-op Assessment: Report given to RN and Post -op Vital signs reviewed and stable  Post vital signs: Reviewed and stable  Last Vitals:  Vitals Value Taken Time  BP 125/55 10/06/20 1300  Temp    Pulse 71 10/06/20 1302  Resp    SpO2 98 % 10/06/20 1302  Vitals shown include unvalidated device data.  Last Pain:  Vitals:   10/06/20 1052  TempSrc: Oral  PainSc: 0-No pain      Patients Stated Pain Goal: 3 (73/53/29 [id number])  Complications: No notable events documented.

## 2020-10-06 NOTE — Discharge Instructions (Addendum)
[facility] _______[facility]  UMBILICAL OR INGUINAL HERNIA REPAIR: POST OP INSTRUCTIONS  Always review your discharge instruction sheet given to you by the facility where your surgery was performed. IF YOU HAVE DISABILITY OR FAMILY LEAVE FORMS, YOU MUST BRING THEM TO THE OFFICE FOR PROCESSING.   DO NOT GIVE THEM TO YOUR DOCTOR.  1. A  prescription for pain medication may be given to you upon discharge.  Take your pain medication as prescribed, if needed.  If narcotic pain medicine is not needed, then you may take acetaminophen (Tylenol) or ibuprofen (Advil) as needed. 2. Take your usually prescribed medications unless otherwise directed. If you need a refill on your pain medication, please contact your pharmacy.  They will contact our office to request authorization. Prescriptions will not be filled after 5 pm or on week-ends. 3. You should follow a light diet the first 24 hours after arrival home, such as soup and crackers, etc.  Be sure to include lots of fluids daily.  Resume your normal diet the day after surgery. 4.Most patients will experience some swelling and bruising around the umbilicus or in the groin and scrotum.  Ice packs and reclining will help.  Swelling and bruising can take several days to resolve.  6. It is common to experience some constipation if taking pain medication after surgery.  Increasing fluid intake and taking a stool softener (such as Colace) will usually help or prevent this problem from occurring.  A mild laxative (Milk of Magnesia or Miralax) should be taken according to package directions if there are no bowel movements after 48 hours. 7. Unless discharge instructions indicate otherwise, you may remove your bandages 24-48 hours after surgery, and you may shower at that time.  You may have steri-strips (small skin tapes) in place directly over the incision.  These strips should be left on the skin for 7-10 days.  If your surgeon used skin glue on the  incision, you may shower in 24 hours.  The glue will flake off over the next 2-3 weeks.  Any sutures or staples will be removed at the office during your follow-up visit. 8. ACTIVITIES:  You may resume regular (light) daily activities beginning the next day--such as daily self-care, walking, climbing stairs--gradually increasing activities as tolerated.  You may have sexual intercourse when it is comfortable.  Refrain from any heavy lifting or straining until approved by your doctor.  a.You may drive when you are no longer taking prescription pain medication, you can comfortably wear a seatbelt, and you can safely maneuver your car and apply brakes. b.RETURN TO WORK:   _____________________________________________  9.You should see your doctor in the office for a follow-up appointment approximately 2-3 weeks after your surgery.  Make sure that you call for this appointment within a day or two after you arrive home to insure a convenient appointment time. 10.OTHER INSTRUCTIONS: __OK TO SHOWER STARTING TOMORROW ICE PACK, TYLENOL, AND IBUPROFEN ALSO FOR PAIN NO LIFTING MORE THAN 15 TO 20 POUNDS FOR 4 WEEKS_______________________    _____________________________________  WHEN TO CALL YOUR DOCTOR: Fever over 101.0 Inability to urinate Nausea and/or vomiting Extreme swelling or bruising Continued bleeding from incision. Increased pain, redness, or drainage from the incision  The clinic staff is available to answer your questions during regular business hours.  Please don't hesitate to call and ask to speak to one of the nurses for clinical concerns.  If you have a medical emergency, go to the nearest emergency room or call  911.  A surgeon from [facility] is always on call at the hospital   [address], [place], [place], [place]  [address] ?  [address], [place], [place]   [address] [phone] ? [phone] ? FAX [phone] Web site: [email or web address]     NO ADVIL/ MOTRIN UNTIL 9 pm  NO TYLENOL PRODUCTS UNTIL 5 PM  Call your surgeon if you experience:   1.  Fever over 101.0. 2.  Inability to urinate. 3.  Nausea and/or vomiting. 4.  Extreme swelling or bruising at the surgical site. 5.  Continued bleeding from the incision. 6.  Increased pain, redness or drainage from the incision. 7.  Problems related to your pain medication. 8.  Any problems and/or concerns   Post Anesthesia Home Care Instructions  Activity: Get plenty of rest for the remainder of the day. A responsible individual must stay with you for 24 hours following the procedure.  For the next 24 hours, DO NOT: -Drive a car -Paediatric nurse -Drink alcoholic beverages -Take any medication unless instructed by your physician -Make any legal decisions or sign important papers.  Meals: Start with liquid foods such as gelatin or soup. Progress to regular foods as tolerated. Avoid greasy, spicy, heavy foods. If nausea and/or vomiting occur, drink only clear liquids until the nausea and/or vomiting subsides. Call your physician if vomiting continues.  Special Instructions/Symptoms: Your throat may feel dry or sore from the anesthesia or the breathing tube placed in your throat during surgery. If this causes discomfort, gargle with warm salt water. The discomfort should disappear within 24 hours.  If you had a scopolamine patch placed behind your ear for the management of post- operative nausea and/or vomiting:  1. The medication in the patch is effective for 72 hours, after which it should be removed.  Wrap patch in a tissue and discard in the trash. Wash hands thoroughly with soap and water. 2. You may remove the patch earlier than 72 hours if you experience unpleasant side effects which may include dry mouth, dizziness or visual disturbances. 3. Avoid touching the patch. Wash your hands with soap and water after contact with the patch.

## 2020-10-06 NOTE — Interval H&P Note (Signed)
History and Physical Interval Note: no change in H and P  10/06/2020 10:50 AM  [name]  has presented today for surgery, with the diagnosis of INCARCERATED UMBILICAL HERNIA.  The various methods of treatment have been discussed with the patient and family. After consideration of risks, benefits and other options for treatment, the patient has consented to  Procedure(s) with comments: [place] (N/A) - LMA ANESTHESIA as a surgical intervention.  The patient's history has been reviewed, patient examined, no change in status, stable for surgery.  I have reviewed the patient's chart and labs.  Questions were answered to the patient's satisfaction.     Coralie Keens

## 2020-10-06 NOTE — Anesthesia Procedure Notes (Signed)
Procedure Name: LMA Insertion Date/Time: 10/06/2020 12:27 PM Performed by: Bufford Spikes, CRNA Pre-anesthesia Checklist: Patient identified, Emergency Drugs available, Suction available and Patient being monitored Patient Re-evaluated:Patient Re-evaluated prior to induction Oxygen Delivery Method: Circle system utilized Preoxygenation: Pre-oxygenation with 100% oxygen Induction Type: IV induction Ventilation: Mask ventilation without difficulty LMA: LMA inserted LMA Size: 4.0 Number of attempts: 1 Placement Confirmation: positive ETCO2 Tube secured with: Tape Dental Injury: Teeth and Oropharynx as per pre-operative assessment

## 2020-10-06 NOTE — Op Note (Signed)
UMBILICAL HERNIA REPAIR  Procedure Note  [name] 10/06/2020   Pre-op Diagnosis: INCARCERATED UMBILICAL HERNIA     Post-op Diagnosis: umbilical hernia  Procedure(s): UMBILICAL HERNIA REPAIR  Surgeon(s): Coralie Keens, MD  Anesthesia: General  Staff:  Circulator: Izora Ribas, RN Relief Circulator: Ted Mcalpine, RN Relief Scrub: Kandis Nab Scrub Person: Lorenza Burton, CST  Estimated Blood Loss: Minimal               Findings: Small umbilical hernia with no signs of incarceration.  The hernia was repaired primarily without mesh.  Procedure: The patient was brought to the operating room and identifies correct patient.  She is placed upon the operating table and general anesthesia was induced.  Her abdomen was prepped and draped in usual sterile fashion.  I anesthetized skin above the umbilicus with Marcaine.  I then made a longitudinal incision with a scalpel.  I then carried this down to the fascia.  The patient had a small fascial defect at the umbilicus.  There were 2 permanent sutures which were quite firm and sharp feeling just above this from her previous surgery.  I elected to excise these and then closed the fascial defect with several figure-of-eight 0 Ethibond sutures.  No other hernia defect was identified.  I anesthetized the fascia further with Marcaine.  I then closed the subcutaneous tissue with interrupted 3-0 Vicryl sutures and closed the skin with a running 4-0 Monocryl.  Dermabond was then applied.  The patient tolerated the procedure well.  All the counts were correct at the end of the procedure.  The patient was then extubated in the operating room and taken in a stable condition to the recovery room.          Coralie Keens   Date: 10/06/2020  Time: 12:57 PM

## 2020-10-08 ENCOUNTER — Encounter (HOSPITAL_BASED_OUTPATIENT_CLINIC_OR_DEPARTMENT_OTHER): Payer: Self-pay | Admitting: Surgery

## 2020-10-28 NOTE — Telephone Encounter (Signed)
Lets see what Dr. Ninfa Linden advises first. Then we can go from there. RG

## 2020-11-06 ENCOUNTER — Encounter: Payer: Self-pay | Admitting: Family

## 2020-11-27 NOTE — Telephone Encounter (Signed)
Add Benefiber 1 tablespoon p.o. q. a.m. Does she have follow-up appt? If not, please make one  RG

## 2020-11-28 ENCOUNTER — Telehealth: Payer: Self-pay | Admitting: *Deleted

## 2020-11-28 NOTE — Telephone Encounter (Signed)
Attempted to contact patient,no answer. LMTCB  Regarding: Rajesh, MD  You 15 hours ago (4:30 PM)   RG Add Benefiber 1 tablespoon p.o. q. a.m. Does she have follow-up appt? If not, please make one

## 2020-11-29 ENCOUNTER — Encounter: Payer: Self-pay | Admitting: Family

## 2020-12-02 ENCOUNTER — Ambulatory Visit: Payer: Medicare Other | Admitting: Family

## 2020-12-04 NOTE — Telephone Encounter (Signed)
Attempted to contact patient, no answer LMTCB 

## 2020-12-05 NOTE — Telephone Encounter (Signed)
Spoke with patient, patient made aware of MD rec's. Patient sscheduled for Follow with Dr Lyndel Safe 11/2 at 10:40am

## 2020-12-17 ENCOUNTER — Encounter: Payer: Medicare Other | Admitting: Family

## 2020-12-30 ENCOUNTER — Ambulatory Visit (INDEPENDENT_AMBULATORY_CARE_PROVIDER_SITE_OTHER): Payer: Medicare Other | Admitting: Family

## 2020-12-30 ENCOUNTER — Encounter: Payer: Self-pay | Admitting: Family

## 2020-12-30 ENCOUNTER — Other Ambulatory Visit: Payer: Self-pay

## 2020-12-30 VITALS — BP 154/76 | HR 66 | Temp 98.5°F | Resp 16 | Ht 61.0 in | Wt 162.0 lb

## 2020-12-30 DIAGNOSIS — J309 Allergic rhinitis, unspecified: Secondary | ICD-10-CM | POA: Diagnosis not present

## 2020-12-30 DIAGNOSIS — E785 Hyperlipidemia, unspecified: Secondary | ICD-10-CM

## 2020-12-30 DIAGNOSIS — Z0000 Encounter for general adult medical examination without abnormal findings: Secondary | ICD-10-CM | POA: Diagnosis not present

## 2020-12-30 DIAGNOSIS — D696 Thrombocytopenia, unspecified: Secondary | ICD-10-CM | POA: Diagnosis not present

## 2020-12-30 DIAGNOSIS — Z23 Encounter for immunization: Secondary | ICD-10-CM | POA: Diagnosis not present

## 2020-12-30 DIAGNOSIS — I10 Essential (primary) hypertension: Secondary | ICD-10-CM | POA: Diagnosis not present

## 2020-12-30 LAB — LIPID PANEL
Cholesterol: 200 mg/dL (ref 0–200)
HDL: 50.6 mg/dL (ref >39.00)
NonHDL: 149.48
Total CHOL/HDL Ratio: 4
Triglycerides: 228 mg/dL — ABNORMAL HIGH (ref 0.0–149.0)
VLDL: 45.6 mg/dL — ABNORMAL HIGH (ref 0.0–40.0)

## 2020-12-30 LAB — COMPREHENSIVE METABOLIC PANEL
ALT: 14 U/L (ref 0–35)
AST: 15 U/L (ref 0–37)
Albumin: 4.4 g/dL (ref 3.5–5.2)
Alkaline Phosphatase: 107 U/L (ref 39–117)
BUN: 20 mg/dL (ref 6–23)
CO2: 30 mEq/L (ref 19–32)
Calcium: 10.2 mg/dL (ref 8.4–10.5)
Chloride: 103 mEq/L (ref 96–112)
Creatinine, Ser: 0.98 mg/dL (ref 0.40–1.20)
GFR: 58.69 mL/min — ABNORMAL LOW (ref >60.00)
Glucose, Bld: 85 mg/dL (ref 70–99)
Potassium: 4 mEq/L (ref 3.5–5.1)
Sodium: 141 mEq/L (ref 135–145)
Total Bilirubin: 0.3 mg/dL (ref 0.2–1.2)
Total Protein: 6.8 g/dL (ref 6.0–8.3)

## 2020-12-30 LAB — CBC WITH DIFFERENTIAL/PLATELET
Basophils Absolute: 0.1 10*3/uL (ref 0.0–0.1)
Basophils Relative: 0.8 % (ref 0.0–3.0)
Eosinophils Absolute: 0.4 10*3/uL (ref 0.0–0.7)
Eosinophils Relative: 4 % (ref 0.0–5.0)
HCT: 43.7 % (ref 36.0–46.0)
Hemoglobin: 14.4 g/dL (ref 12.0–15.0)
Lymphocytes Relative: 22.5 % (ref 12.0–46.0)
Lymphs Abs: 2.3 10*3/uL (ref 0.7–4.0)
MCHC: 32.9 g/dL (ref 30.0–36.0)
MCV: 90.6 fl (ref 78.0–100.0)
Monocytes Absolute: 0.9 10*3/uL (ref 0.1–1.0)
Monocytes Relative: 8.6 % (ref 3.0–12.0)
Neutro Abs: 6.4 10*3/uL (ref 1.4–7.7)
Neutrophils Relative %: 64.1 % (ref 43.0–77.0)
Platelets: 194 10*3/uL (ref 150.0–400.0)
RBC: 4.82 Mil/uL (ref 3.87–5.11)
RDW: 14.2 % (ref 11.5–15.5)
WBC: 10 10*3/uL (ref 4.0–10.5)

## 2020-12-30 LAB — LDL CHOLESTEROL, DIRECT: Direct LDL: 110 mg/dL

## 2020-12-30 MED ORDER — MONTELUKAST SODIUM 10 MG PO TABS: 10.0000 mg | ORAL_TABLET | Freq: Every day | ORAL | 3 refills | Status: DC

## 2020-12-30 MED ORDER — TETANUS-DIPHTHERIA TOXOIDS TD 2-2 LF/0.5ML IM SUSP: 0.5000 mL | Freq: Once | INTRAMUSCULAR | 0 refills | Status: AC

## 2020-12-30 NOTE — Assessment & Plan Note (Addendum)
Uncontrolled on zyrtec. Will add singulair 10mg .

## 2020-12-30 NOTE — Progress Notes (Signed)
Subjective:   By signing my name below, I, [name], attest that this documentation has been prepared under the direction and in the presence of [name], NP, 12/30/2020   Patient ID: [name], female    DOB: 03-Jun-1950, 70 y.o.   MRN: [id number]  No chief complaint on file.   HPI Patient is in today for a comprehensive physical exam. Constipation: She complains of frequent constipation which she notes she has used Miralax and Benefiber as well as magnesium citrate and found little to no relief.  Burning in throat: She notes when she lays down at night she has a burning in her throat and said that last night she took 7 Tums to help relieve this.  Headaches: She mentions that she has been having headaches that are constant everyday. She has used Excedrin and Advil and has not experienced much relief.  Back Pain: She notes a history of chronic back pain. Body pain: She noted a few nights ago experiencing a pain that rushed from her hips and groin, down her legs and into her toes. She does not notes any other myalgias or joint pain. She denies having any unexpected weight change, ear pain, hearing loss and rhinorrhea, visual disturbance, cough, chest pain and leg swelling, nausea, vomiting, diarrhea and blood in stool, or dysuria and frequency, rash, adenopathy, depression or anxiety at this time. Immunizations: She has received her flu shot in the office today and is interested in receiving her updated Covid-19 booster shot. She is due for tetanus.  Diet: She notes that her stomach concerns have affected what food she consumes so she is very limited on what she eats.  Exercise: She notes that she has slowed down due to her age and says that she does not exercise.  Depression and Anxiety: She notes that her daughter recently separated from her husband and has moved in with her. There is tension with this new living situation which is causing her some mild anxiety.   Alcohol: She does not consume alcohol. Drugs: She does not use drugs.  Tobacco: She does not use tobacco.  Colonoscopy: Last performed on 05/28/2015 and results were normal. Repeat procedure every 10 years. Not due until 2027.  Dexa: Performed on 04/13/2005 and results were normal.  Pap Smear: Last performed on 09/14/2016 and the results were normal.  Mammogram: Last performed on 12/15/202 and the results were normal. She is due for an updated scan on March 12 2021. Shx: No changes noted for her surgical history at this time.  FMHx: No changes noted for her family medical history.  Vision: She is UTD on vision.  Dental: She is UTD on dental.   Health Maintenance Due  Topic Date Due   DEXA SCAN  12/28/2015   TETANUS/TDAP  06/07/2018   COVID-19 Vaccine (4 - Booster for [employer] series) 02/27/2020   INFLUENZA VACCINE  10/27/2020    Past Medical History:  Diagnosis Date   Allergy    seasonal   Anxiety    Arachnoiditis    Cataract    Depression    Elevated liver enzymes    Environmental allergies    GERD (gastroesophageal reflux disease)    H/O being hospitalized 03/2012   x1 week for nausea   History of chicken pox    Hyperlipidemia    Hypertension    Migraine with typical aura    resolved years ago   Nausea    chronic nausea    Neuromuscular disorder ([place])  Past Surgical History:  Procedure Laterality Date   ABDOMINOPLASTY  07/11/09   EUS N/A 05/05/2012   Procedure: UPPER ENDOSCOPIC ULTRASOUND (EUS) LINEAR;  Surgeon: Beryle Beams, MD;  Location: [facility];  Service: Endoscopy;  Laterality: N/A;   LAPAROSCOPIC CHOLECYSTECTOMY  2010   NASAL SINUS SURGERY     "Has had 2-3 surgeries over the years"   UMBILICAL HERNIA REPAIR N/A 10/06/2020   Procedure: UMBILICAL HERNIA REPAIR;  Surgeon: Coralie Keens, MD;  Location: [facility];  Service: General;  Laterality: N/A;  LMA ANESTHESIA    Family History  Problem Relation Age of Onset   Alzheimer's  disease Mother    Hypertension Mother    Migraines Mother    Hypertension Father    Other Father        ?carotid artery aneurysm   Cancer Brother        mouth and throat   Colon cancer Neg Hx     Social History   Socioeconomic History   Marital status: Married    Spouse name: Not on file   Number of children: Not on file   Years of education: Not on file   Highest education level: Not on file  Occupational History   Not on file  Tobacco Use   Smoking status: Former    Packs/day: 0.25    Years: 8.00    Pack years: 2.00    Types: Cigarettes    Quit date: 08/09/1986    Years since quitting: 34.4   Smokeless tobacco: Never  Vaping Use   Vaping Use: Never used  Substance and Sexual Activity   Alcohol use: No   Drug use: No   Sexual activity: Not Currently    Partners: Male    Birth control/protection: Other-see comments, Post-menopausal    Comment: husband had vasectomy.   Other Topics Concern   Not on file  Social History Narrative   2 step daughters- 60 and 38   She worked as an Web designer.   Enjoys puzzles, counted cross stitch.   Completed 1 year of college   4 cats   Social Determinants of Health   Financial Resource Strain: Not on file  Food Insecurity: Not on file  Transportation Needs: Not on file  Physical Activity: Not on file  Stress: Not on file  Social Connections: Not on file  Intimate Partner Violence: Not on file    Outpatient Medications Prior to Visit  Medication Sig Dispense Refill   amLODipine (NORVASC) 5 MG tablet TAKE 1 TABLET (5 MG TOTAL) BY MOUTH DAILY. 30 tablet 5   amphetamine-dextroamphetamine (ADDERALL) 20 MG tablet Take 20 mg by mouth daily.     clonazePAM (KLONOPIN) 1 MG tablet TAKE 1 TABLET BY MOUTH THREE TIMES A DAY AS NEEDED FOR ANXIETY 90 tablet 0   clotrimazole-betamethasone (LOTRISONE) cream Apply 1 application topically 2 (two) times daily. 30 g 0   gabapentin (NEURONTIN) 100 MG capsule Take 1 capsule (100  mg total) by mouth 3 (three) times daily. 90 capsule 3   hydrOXYzine (ATARAX/VISTARIL) 10 MG tablet Take 10 mg by mouth 3 (three) times daily.     mirtazapine (REMERON) 45 MG tablet Take 45 mg by mouth at bedtime.      Multiple Vitamin (MULTIVITAMIN) capsule Take 1 capsule by mouth daily.     Nutritional Supplements (LIVER DEFENSE PO) Take by mouth.     ondansetron (ZOFRAN-ODT) 4 MG disintegrating tablet Take 1 tablet (4 mg total) by mouth every  8 (eight) hours as needed for nausea or vomiting. 20 tablet 2   pantoprazole (PROTONIX) 40 MG tablet Take 1 tablet (40 mg total) by mouth daily. 30 tablet 11   Probiotic Product (PROBIOTIC DAILY PO) Take 1 capsule by mouth daily.      traZODone (DESYREL) 50 MG tablet Take 50-150 mg by mouth at bedtime as needed for sleep.      Apoaequorin (PREVAGEN PO) Take by mouth.     b complex vitamins capsule Take 1 capsule by mouth daily.     omeprazole (PRILOSEC) 40 MG capsule Take 1 capsule (40 mg total) by mouth in the morning and at bedtime. 60 capsule 3   traMADol (ULTRAM) 50 MG tablet Take 1 tablet (50 mg total) by mouth every 6 (six) hours as needed. 20 tablet 0   vitamin B-12 (CYANOCOBALAMIN) 100 MCG tablet Take 100 mcg by mouth daily.     VITAMIN D-VITAMIN K PO Take by mouth.     No facility-administered medications prior to visit.    Allergies  Allergen Reactions   Compazine [Prochlorperazine]     hyperactivity   Morphine And Related Hives and Itching   Trintellix [Vortioxetine] Nausea And Vomiting    Review of Systems  Constitutional:  Negative for fever. Chills: unexpected.      (-) unexpected weight changes  HENT:  Positive for sinus pain (chronic). Negative for ear pain and hearing loss.        (-) rhinorrhea   Eyes:  Negative for pain.       (-) visual disturbances  Respiratory:  Negative for cough.   Cardiovascular:  Negative for chest pain and leg swelling.  Gastrointestinal:  Positive for constipation. Negative for blood in stool,  diarrhea, nausea and vomiting.  Genitourinary:  Negative for dysuria and frequency.  Musculoskeletal:  Positive for back pain (chronic). Negative for joint pain and myalgias.  Neurological:  Positive for headaches (Chronic occuring everyday).  Psychiatric/Behavioral:  Negative for depression. The patient is nervous/anxious (mild and situational).       Objective:    Physical Exam Constitutional:      General: She is not in acute distress.    Appearance: Normal appearance. She is not ill-appearing.  HENT:     Head: Normocephalic and atraumatic.     Right Ear: Tympanic membrane, ear canal and external ear normal.     Left Ear: Tympanic membrane, ear canal and external ear normal.     Nose: No congestion or rhinorrhea.  Eyes:     Extraocular Movements: Extraocular movements intact.     Pupils: Pupils are equal, round, and reactive to light.     Comments: (-) nystagmus  Cardiovascular:     Rate and Rhythm: Normal rate and regular rhythm.     Heart sounds: Normal heart sounds. No murmur heard.   No gallop.  Pulmonary:     Effort: Pulmonary effort is normal. No respiratory distress.     Breath sounds: Normal breath sounds. No wheezing or rales.  Chest:  Breasts:    Right: Normal.     Left: Normal.  Musculoskeletal:     Comments: (+) 5/5 upper and lower extremity strength  Lymphadenopathy:     Cervical: No cervical adenopathy.  Skin:    General: Skin is warm and dry.  Neurological:     Mental Status: She is alert and oriented to person, place, and time.     Deep Tendon Reflexes:     Reflex Scores:  Patellar reflexes are 2+ on the right side and 2+ on the left side. Psychiatric:        Behavior: Behavior normal.        Judgment: Judgment normal.    BP (!) 154/76 (BP Location: Right Arm, Patient Position: Sitting, Cuff Size: Small)   Pulse 66   Temp 98.5 F (36.9 C) (Oral)   Resp 16   Ht 5' 1" (1.549 m)   Wt 162 lb (73.5 kg)   LMP 12/28/2011   SpO2 98%   BMI 30.61  kg/m  Wt Readings from Last 3 Encounters:  12/30/20 162 lb (73.5 kg)  10/06/20 164 lb 0.4 oz (74.4 kg)  08/05/20 160 lb (72.6 kg)       Assessment & Plan:   Problem List Items Addressed This Visit       Unprioritized   Thrombocytopenia ([place])   Relevant Orders   CBC w/Diff   Preventative health care - Primary    Discussed healthy diet, exercise. Colo up to date. She plans to get bivalent covid vaccine.  Flu shot today. Rx sent for Td to her pharmacy. She will plan mammogram in December.        Relevant Orders   MM 3D SCREEN BREAST BILATERAL   Hypertension   Relevant Orders   Comp Met (CMET)   Allergic rhinitis    Uncontrolled on zyrtec. Will add singulair 50m.          Relevant Medications   montelukast (SINGULAIR) 10 MG tablet   Other Visit Diagnoses     Needs flu shot       Relevant Orders   Flu Vaccine QUAD High Dose(Fluad)   Hyperlipidemia, unspecified hyperlipidemia type       Relevant Orders   Lipid panel      Meds ordered this encounter  Medications   diptheria-tetanus toxoids (DECAVAC) 2-2 LF/0.5ML injection    Sig: Inject 0.5 mLs into the muscle once for 1 dose.    Dispense:  0.5 mL    Refill:  0    Order Specific Question:   Supervising Provider    Answer:   BPenni HomansA [[id number]]   montelukast (SINGULAIR) 10 MG tablet    Sig: Take 1 tablet (10 mg total) by mouth at bedtime.    Dispense:  30 tablet    Refill:  3    Order Specific Question:   Supervising Provider    Answer:   BPenni HomansA [[id number]]    I, MDebbrah Alar NP, personally preformed the services described in this documentation.  All medical record entries made by the scribe were at my direction and in my presence.  I have reviewed the chart and discharge instructions (if applicable) and agree that the record reflects my personal performance and is accurate and complete. 12/30/2020  I,[name],acting as a sEducation administratorfor MNance Pear NP.,have documented all  relevant documentation on the behalf of MNance Pear NP,as directed by  MNance Pear NP while in the presence of MNance Pear NP.  MNance Pear NP

## 2020-12-30 NOTE — Assessment & Plan Note (Signed)
Discussed healthy diet, exercise. Colo up to date. She plans to get bivalent covid vaccine.  Flu shot today. Rx sent for Td to her pharmacy. She will plan mammogram in December.

## 2020-12-31 ENCOUNTER — Telehealth (HOSPITAL_BASED_OUTPATIENT_CLINIC_OR_DEPARTMENT_OTHER): Payer: Self-pay

## 2021-01-01 ENCOUNTER — Other Ambulatory Visit: Payer: Self-pay | Admitting: Gastroenterology

## 2021-01-01 DIAGNOSIS — I10 Essential (primary) hypertension: Secondary | ICD-10-CM | POA: Diagnosis not present

## 2021-01-01 DIAGNOSIS — Z23 Encounter for immunization: Secondary | ICD-10-CM | POA: Diagnosis not present

## 2021-01-01 DIAGNOSIS — Z0000 Encounter for general adult medical examination without abnormal findings: Secondary | ICD-10-CM | POA: Diagnosis not present

## 2021-01-06 ENCOUNTER — Encounter (HOSPITAL_BASED_OUTPATIENT_CLINIC_OR_DEPARTMENT_OTHER): Payer: Self-pay

## 2021-01-06 ENCOUNTER — Telehealth (HOSPITAL_BASED_OUTPATIENT_CLINIC_OR_DEPARTMENT_OTHER): Payer: Self-pay

## 2021-01-06 ENCOUNTER — Ambulatory Visit (HOSPITAL_BASED_OUTPATIENT_CLINIC_OR_DEPARTMENT_OTHER): Payer: Medicare Other

## 2021-01-13 ENCOUNTER — Encounter: Payer: Self-pay | Admitting: Family

## 2021-01-14 NOTE — Telephone Encounter (Signed)
I doubt it is due to the new medications  lets go back to omeprazole-same dose as before RG

## 2021-01-15 ENCOUNTER — Encounter: Payer: Self-pay | Admitting: Family

## 2021-01-15 ENCOUNTER — Other Ambulatory Visit: Payer: Self-pay

## 2021-01-15 MED ORDER — OMEPRAZOLE 40 MG PO CPDR: 40.0000 mg | DELAYED_RELEASE_CAPSULE | Freq: Two times a day (BID) | ORAL | 3 refills | Status: DC

## 2021-01-22 ENCOUNTER — Encounter: Payer: Self-pay | Admitting: Family

## 2021-01-23 ENCOUNTER — Other Ambulatory Visit: Payer: Self-pay | Admitting: Family

## 2021-01-25 ENCOUNTER — Other Ambulatory Visit: Payer: Self-pay | Admitting: Gastroenterology

## 2021-01-28 ENCOUNTER — Ambulatory Visit: Payer: Medicare Other | Admitting: Gastroenterology

## 2021-02-02 ENCOUNTER — Ambulatory Visit (INDEPENDENT_AMBULATORY_CARE_PROVIDER_SITE_OTHER): Payer: Medicare Other | Admitting: Gastroenterology

## 2021-02-02 ENCOUNTER — Other Ambulatory Visit: Payer: Self-pay

## 2021-02-02 ENCOUNTER — Encounter: Payer: Self-pay | Admitting: Gastroenterology

## 2021-02-02 VITALS — BP 124/82 | HR 65 | Ht 61.0 in | Wt 162.0 lb

## 2021-02-02 DIAGNOSIS — R1084 Generalized abdominal pain: Secondary | ICD-10-CM | POA: Diagnosis not present

## 2021-02-02 DIAGNOSIS — K581 Irritable bowel syndrome with constipation: Secondary | ICD-10-CM

## 2021-02-02 MED ORDER — LINACLOTIDE 72 MCG PO CAPS: 72.0000 ug | ORAL_CAPSULE | Freq: Every day | ORAL | 5 refills | Status: DC

## 2021-02-02 NOTE — Patient Instructions (Signed)
If you are age 70 or older, your body mass index should be between 23-30. Your Body mass index is 30.61 kg/m. If this is out of the aforementioned range listed, please consider follow up with your Primary Care Provider.  If you are age 66 or younger, your body mass index should be between 19-25. Your Body mass index is 30.61 kg/m. If this is out of the aformentioned range listed, please consider follow up with your Primary Care Provider.   __________________________________________________________  The [facility] providers would like to encourage you to use [facility] to communicate with providers for non-urgent requests or questions.  Due to long hold times on the telephone, sending your provider a message by [facility] may be a faster and more efficient way to get a response.  Please allow 48 business hours for a response.  Please remember that this is for non-urgent requests.   We have given you samples of Linzess 72 mg take 1 tablet daily. There is enough samples for 16 days.  I have also sent a prescription to your pharmacy for 72 mcg of Linzess. Please also start on magnesium 400 mg daily,  Follow up as needed.  It was a pleasure to see you today!  Jackquline Denmark, M.D.

## 2021-02-02 NOTE — Progress Notes (Signed)
Chief Complaint: FU  Referring Provider:  [name], NP      ASSESSMENT AND PLAN;   #1. IBS-C. Neg colon with Bx 2017. No benefit with fiber, miralax.  She got significant relief from magnesium citrate.  #2. abdo pain- related to IBS. Neg CT AP 09/2020  #3. GERD with small HH   Plan: -Mg 400mg  po qd -Trial of Linzess 72 mcg po qd (samples given) -Hold off on colon as she is not having any red flag symptoms. -Extensive notes, labs, recent hospitalization, MR abdomen was reviewed. -FU as needed    HPI:    [name] is a 70 y.o. female  With extensive complex GI history, has been to multiple gastroenterologists.  With history of anxiety/depression, GERD, HTN, migraine has been seen at [facility] for chronic nausea/vomiting/diarrhea several years ago by Dr. Derrill Kay and diagnosed as having IBS.  She had negative extensive GI evaluation as below.  C/O lower abdo pain with bloating with constipation.  She started taking magnesium citrate with resultant improvement in symptoms.  She is willing to try magnesium supplements.  She has tried fiber supplements and MiraLAX in the past without any definite benefit.  Would really have diarrhea now.  Negative extensive GI evaluation as detailed below.  Normal CBC, CMP  Has had tried gluten-free diet without any benefit    From previous notes: -Had gastroenteritis while visiting her brother (who was Dx with head and neck CA) in [place].  She started having profuse diarrhea after eating cheese quesadilla followed by nausea/vomiting.  She left [place] early and had to "stop at every stop" with diarrhea, spillage and incontinence.  She had to change her clothes twice.  Admitted 01/08/2020-01/10/2020 with dehydration, AKI treated with IV fluids.  Her stool studies including stool for C. difficile was negative.  Also was found to have UTI and treated with Cipro.  From GI standpoint, she was  managed conservatively.  Wt Readings from Last 3 Encounters:  02/02/21 162 lb (73.5 kg)  12/30/20 162 lb (73.5 kg)  10/06/20 164 lb 0.4 oz (74.4 kg)   Previously had abnormal LFTs due to fatty liver.  Had EUS performed by Dr. Benson Norway as below which only showed fatty liver.  No choledocholithiasis.  Most recently LFTs have been normal.   Past GI procedures:  CT 09/2020 1. No acute findings in the abdomen or pelvis. 2. Moderate volume of formed stool throughout the descending and sigmoid colon, which may represent constipation. 3. Sigmoid colonic diverticulosis without findings of acute diverticulitis. 4. Hepatic steatosis. 5.  Aortic Atherosclerosis (ICD10-I70.0).  EGD 12/2019 - Small hiatal hernia. - Moderate gastritis. - Neg SB Bx for celiac, gastric Bx -negative for HP    Records from Care Everywhere:  H/O Nausea 2017  1. Nausea may be due to IBS-like syndrome. Bentyl 10 mg QID and increasing doses of Citracill 2. Stress related nausea may be a separate issue and she will see you regarding adjustment of appropriate drugs for this.  1. EGD 02/17/2011 (Dr Collene Mares)- Small HH, normal --- bx showed no H pylori. Neg eso Bx for EoE 2. Colonoscopy 2017 at [facility] showed no colitis 3. Smartpill motility study--normal stomach, small bowel and colon transit times  MRI abdomen 01/06/2019 1. Mild motion degradation. 2. No dominant or suspicious renal lesion. No correlate for the ultrasound abnormality. 3. Tiny bilateral renal lesions which are likely cysts. A 4 mm lower pole right renal angiomyolipoma is similar back to  11/19/2008. 4.  Tiny hiatal hernia. 5. Hepatic steatosis.  Past Medical History:  Diagnosis Date   Allergy    seasonal   Anxiety    Arachnoiditis    Cataract    Depression    Elevated liver enzymes    Environmental allergies    GERD (gastroesophageal reflux disease)    H/O being hospitalized 03/2012   x1 week for nausea   History of chicken pox     Hyperlipidemia    Hypertension    Migraine with typical aura    resolved years ago   Nausea    chronic nausea    Neuromuscular disorder [facility])     Past Surgical History:  Procedure Laterality Date   ABDOMINOPLASTY  07/11/09   EUS N/A 05/05/2012   Procedure: UPPER ENDOSCOPIC ULTRASOUND (EUS) LINEAR;  Surgeon: Beryle Beams, MD;  Location: [facility];  Service: Endoscopy;  Laterality: N/A;   LAPAROSCOPIC CHOLECYSTECTOMY  2010   NASAL SINUS SURGERY     "Has had 2-3 surgeries over the years"   UMBILICAL HERNIA REPAIR N/A 10/06/2020   Procedure: UMBILICAL HERNIA REPAIR;  Surgeon: Coralie Keens, MD;  Location: [facility];  Service: General;  Laterality: N/A;  LMA ANESTHESIA    Family History  Problem Relation Age of Onset   Alzheimer's disease Mother    Hypertension Mother    Migraines Mother    Hypertension Father    Other Father        ?carotid artery aneurysm   Cancer Brother        mouth and throat   Colon cancer Neg Hx     Social History   Tobacco Use   Smoking status: Former    Packs/day: 0.25    Years: 8.00    Pack years: 2.00    Types: Cigarettes    Quit date: 08/09/1986    Years since quitting: 34.5   Smokeless tobacco: Never  Vaping Use   Vaping Use: Never used  Substance Use Topics   Alcohol use: No   Drug use: No    Current Outpatient Medications  Medication Sig Dispense Refill   amLODipine (NORVASC) 5 MG tablet Take 1 tablet (5 mg total) by mouth daily. 90 tablet 1   amphetamine-dextroamphetamine (ADDERALL) 20 MG tablet Take 20 mg by mouth daily.     clonazePAM (KLONOPIN) 1 MG tablet TAKE 1 TABLET BY MOUTH THREE TIMES A DAY AS NEEDED FOR ANXIETY 90 tablet 0   clotrimazole-betamethasone (LOTRISONE) cream Apply 1 application topically 2 (two) times daily. 30 g 0   gabapentin (NEURONTIN) 100 MG capsule Take 1 capsule (100 mg total) by mouth 3 (three) times daily. 90 capsule 3   hydrOXYzine (ATARAX/VISTARIL) 10 MG tablet Take 10 mg by  mouth 3 (three) times daily.     MAGNESIUM CITRATE PO Take by mouth. Take 4 to 6 capsules a day     mirtazapine (REMERON) 45 MG tablet Take 45 mg by mouth at bedtime.      montelukast (SINGULAIR) 10 MG tablet Take 1 tablet (10 mg total) by mouth at bedtime. 30 tablet 3   Multiple Vitamin (MULTIVITAMIN) capsule Take 1 capsule by mouth daily. Dynamic brain     Nutritional Supplements (LIVER DEFENSE PO) Take by mouth.     omeprazole (PRILOSEC) 40 MG capsule Take 1 capsule (40 mg total) by mouth in the morning and at bedtime. 90 capsule 3   ondansetron (ZOFRAN-ODT) 4 MG disintegrating tablet TAKE 1 TABLET BY MOUTH EVERY 8  HOURS AS NEEDED FOR NAUSEA AND VOMITING 20 tablet 0   Probiotic Product (PROBIOTIC DAILY PO) Take 1 capsule by mouth daily.      traZODone (DESYREL) 50 MG tablet Take 50-150 mg by mouth at bedtime as needed for sleep.      No current facility-administered medications for this visit.    Allergies  Allergen Reactions   Compazine [Prochlorperazine]     hyperactivity   Morphine And Related Hives and Itching   Trintellix [Vortioxetine] Nausea And Vomiting    Review of Systems:  Psychiatric/Behavioral: Has anxiety or depression     Physical Exam:    BP 124/82   Pulse 65   Ht 5\' 1"  (1.549 m)   Wt 162 lb (73.5 kg)   LMP 12/28/2011   SpO2 96%   BMI 30.61 kg/m  Wt Readings from Last 3 Encounters:  02/02/21 162 lb (73.5 kg)  12/30/20 162 lb (73.5 kg)  10/06/20 164 lb 0.4 oz (74.4 kg)   Constitutional:  Well-developed, in no acute distress. Psychiatric: Normal mood and affect. Behavior is normal. HEENT: Pupils normal.  Conjunctivae are normal. No scleral icterus. Cardiovascular: Normal rate, regular rhythm. No edema Pulmonary/chest: Effort normal and breath sounds normal. No wheezing, rales or rhonchi. Abdominal: Soft, nondistended.  Has epigastric tenderness.  Bowel sounds active throughout. There are no masses palpable. No hepatomegaly. Rectal:   defered Neurological: Alert and oriented to person place and time. Skin: Skin is warm and dry. No rashes noted.  Data Reviewed: I have personally reviewed following labs and imaging studies  CBC: CBC Latest Ref Rng & Units 12/30/2020 01/18/2020 01/10/2020  WBC 4.0 - 10.5 K/uL 10.0 9.9 6.8  Hemoglobin 12.0 - 15.0 g/dL 14.4 13.8 12.5  Hematocrit 36.0 - 46.0 % 43.7 42.3 37.2  Platelets 150.0 - 400.0 K/uL 194.0 138(L) 83(L)    CMP: CMP Latest Ref Rng & Units 12/30/2020 09/25/2020 02/28/2020  Glucose 70 - 99 mg/dL 85 - -  BUN 6 - 23 mg/dL 20 - 15  Creatinine 0.40 - 1.20 mg/dL 0.98 0.80 0.93  Sodium 135 - 145 mEq/L 141 - -  Potassium 3.5 - 5.1 mEq/L 4.0 - -  Chloride 96 - 112 mEq/L 103 - -  CO2 19 - 32 mEq/L 30 - -  Calcium 8.4 - 10.5 mg/dL 10.2 - -  Total Protein 6.0 - 8.3 g/dL 6.8 - -  Total Bilirubin 0.2 - 1.2 mg/dL 0.3 - -  Alkaline Phos 39 - 117 U/L 107 - -  AST 0 - 37 U/L 15 - -  ALT 0 - 35 U/L 14 - -        Carmell Austria, MD 02/02/2021, 3:11 PM  Cc: [name], NP

## 2021-02-05 ENCOUNTER — Other Ambulatory Visit: Payer: Self-pay | Admitting: Family

## 2021-02-08 ENCOUNTER — Other Ambulatory Visit: Payer: Self-pay | Admitting: Family

## 2021-02-08 DIAGNOSIS — J309 Allergic rhinitis, unspecified: Secondary | ICD-10-CM

## 2021-02-23 ENCOUNTER — Other Ambulatory Visit: Payer: Self-pay

## 2021-02-23 ENCOUNTER — Ambulatory Visit (HOSPITAL_BASED_OUTPATIENT_CLINIC_OR_DEPARTMENT_OTHER)
Admission: RE | Admit: 2021-02-23 | Discharge: 2021-02-23 | Disposition: A | Discharge status: Home / Self Care | Payer: Medicare Other | Source: Ambulatory Visit | Attending: Family | Admitting: Family

## 2021-02-23 DIAGNOSIS — Z1231 Encounter for screening mammogram for malignant neoplasm of breast: Secondary | ICD-10-CM | POA: Insufficient documentation

## 2021-02-23 DIAGNOSIS — Z0000 Encounter for general adult medical examination without abnormal findings: Secondary | ICD-10-CM | POA: Diagnosis not present

## 2021-02-28 ENCOUNTER — Other Ambulatory Visit: Payer: Self-pay | Admitting: Gastroenterology

## 2021-03-04 ENCOUNTER — Encounter: Payer: Self-pay | Admitting: Gastroenterology

## 2021-03-04 NOTE — Telephone Encounter (Signed)
Pt states that she had an office visit with Dr. Lyndel Safe on 02/02/2022. Pt stated that she was started on Linzess. Pt stated that she only took it for several days due to the fact that it gave her severe diarrhea after taking it she would have 4-6 loose stools. Pt states that she has been trying to drink two cups of Coffee recently to try and have a BM. Pt states that sometime the coffee assist in helping her have a BM although pt states that's its been three days since she has had a BM. Pt seeking advice. Please advise.

## 2021-03-09 ENCOUNTER — Other Ambulatory Visit: Payer: Self-pay

## 2021-03-09 DIAGNOSIS — R197 Diarrhea, unspecified: Secondary | ICD-10-CM

## 2021-03-09 DIAGNOSIS — K581 Irritable bowel syndrome with constipation: Secondary | ICD-10-CM

## 2021-03-09 MED ORDER — LINACLOTIDE 72 MCG PO CAPS: ORAL_CAPSULE | ORAL | 5 refills | Status: DC

## 2021-03-09 MED ORDER — DOCUSATE SODIUM 100 MG PO CAPS: 100.0000 mg | ORAL_CAPSULE | Freq: Two times a day (BID) | ORAL | 0 refills | Status: DC

## 2021-03-21 ENCOUNTER — Other Ambulatory Visit: Payer: Self-pay | Admitting: Gastroenterology

## 2021-03-21 DIAGNOSIS — K581 Irritable bowel syndrome with constipation: Secondary | ICD-10-CM

## 2021-04-06 ENCOUNTER — Telehealth: Payer: Self-pay | Admitting: Family

## 2021-04-06 ENCOUNTER — Encounter: Payer: Self-pay | Admitting: Family

## 2021-04-06 ENCOUNTER — Ambulatory Visit (INDEPENDENT_AMBULATORY_CARE_PROVIDER_SITE_OTHER): Payer: Medicare Other

## 2021-04-06 VITALS — Ht 61.0 in | Wt 157.0 lb

## 2021-04-06 DIAGNOSIS — Z780 Asymptomatic menopausal state: Secondary | ICD-10-CM | POA: Diagnosis not present

## 2021-04-06 DIAGNOSIS — Z0000 Encounter for general adult medical examination without abnormal findings: Secondary | ICD-10-CM

## 2021-04-06 NOTE — Patient Instructions (Signed)
[name] , Thank you for taking time to complete your Medicare Wellness Visit. I appreciate your ongoing commitment to your health goals. Please review the following plan we discussed and let me know if I can assist you in the future.   Screening recommendations/referrals: Colonoscopy: Completed 05/28/2015-Please consult with GI concerning when to repeat colonoscopy. Mammogram: Completed 02/23/2021-Due 02/23/2022 Bone Density: Ordered today. Someone will call you to schedule. Recommended yearly ophthalmology/optometry visit for glaucoma screening and checkup Recommended yearly dental visit for hygiene and checkup  Vaccinations: Influenza vaccine: Up to date Pneumococcal vaccine: Discuss with PCP Tdap vaccine: Due-May obtain vaccine at your local pharmacy. Shingles vaccine: Completed vaccines   Covid-19:Up to date  Advanced directives: Please bring a copy of Living Will and/or Healthcare Power of Attorney for your chart.   Conditions/risks identified: See problem list  Next appointment: Follow up in one year for your annual wellness visit    Preventive Care 65 Years and Older, Female Preventive care refers to lifestyle choices and visits with your health care provider that can promote health and wellness. What does preventive care include? A yearly physical exam. This is also called an annual well check. Dental exams once or twice a year. Routine eye exams. Ask your health care provider how often you should have your eyes checked. Personal lifestyle choices, including: Daily care of your teeth and gums. Regular physical activity. Eating a healthy diet. Avoiding tobacco and drug use. Limiting alcohol use. Practicing safe sex. Taking low-dose aspirin every day. Taking vitamin and mineral supplements as recommended by your health care provider. What happens during an annual well check? The services and screenings done by your health care provider during your annual well check will  depend on your age, overall health, lifestyle risk factors, and family history of disease. Counseling  Your health care provider may ask you questions about your: Alcohol use. Tobacco use. Drug use. Emotional well-being. Home and relationship well-being. Sexual activity. Eating habits. History of falls. Memory and ability to understand (cognition). Work and work Statistician. Reproductive health. Screening  You may have the following tests or measurements: Height, weight, and BMI. Blood pressure. Lipid and cholesterol levels. These may be checked every 5 years, or more frequently if you are over 63 years old. Skin check. Lung cancer screening. You may have this screening every year starting at age 52 if you have a 30-pack-year history of smoking and currently smoke or have quit within the past 15 years. Fecal occult blood test (FOBT) of the stool. You may have this test every year starting at age 60. Flexible sigmoidoscopy or colonoscopy. You may have a sigmoidoscopy every 5 years or a colonoscopy every 10 years starting at age 48. Hepatitis C blood test. Hepatitis B blood test. Sexually transmitted disease (STD) testing. Diabetes screening. This is done by checking your blood sugar (glucose) after you have not eaten for a while (fasting). You may have this done every 1-3 years. Bone density scan. This is done to screen for osteoporosis. You may have this done starting at age 22. Mammogram. This may be done every 1-2 years. Talk to your health care provider about how often you should have regular mammograms. Talk with your health care provider about your test results, treatment options, and if necessary, the need for more tests. Vaccines  Your health care provider may recommend certain vaccines, such as: Influenza vaccine. This is recommended every year. Tetanus, diphtheria, and acellular pertussis (Tdap, Td) vaccine. You may need a Td booster  every 10 years. Zoster vaccine. You may  need this after age 38. Pneumococcal 13-valent conjugate (PCV13) vaccine. One dose is recommended after age 51. Pneumococcal polysaccharide (PPSV23) vaccine. One dose is recommended after age 35. Talk to your health care provider about which screenings and vaccines you need and how often you need them. This information is not intended to replace advice given to you by your health care provider. Make sure you discuss any questions you have with your health care provider. Document Released: 04/11/2015 Document Revised: 12/03/2015 Document Reviewed: 01/14/2015 Elsevier Interactive Patient Education  2017 [place] Prevention in the Home Falls can cause injuries. They can happen to people of all ages. There are many things you can do to make your home safe and to help prevent falls. What can I do on the outside of my home? Regularly fix the edges of walkways and driveways and fix any cracks. Remove anything that might make you trip as you walk through a door, such as a raised step or threshold. Trim any bushes or trees on the path to your home. Use bright outdoor lighting. Clear any walking paths of anything that might make someone trip, such as rocks or tools. Regularly check to see if handrails are loose or broken. Make sure that both sides of any steps have handrails. Any raised decks and porches should have guardrails on the edges. Have any leaves, snow, or ice cleared regularly. Use sand or salt on walking paths during winter. Clean up any spills in your garage right away. This includes oil or grease spills. What can I do in the bathroom? Use night lights. Install grab bars by the toilet and in the tub and shower. Do not use towel bars as grab bars. Use non-skid mats or decals in the tub or shower. If you need to sit down in the shower, use a plastic, non-slip stool. Keep the floor dry. Clean up any water that spills on the floor as soon as it happens. Remove soap buildup in  the tub or shower regularly. Attach bath mats securely with double-sided non-slip rug tape. Do not have throw rugs and other things on the floor that can make you trip. What can I do in the bedroom? Use night lights. Make sure that you have a light by your bed that is easy to reach. Do not use any sheets or blankets that are too big for your bed. They should not hang down onto the floor. Have a firm chair that has side arms. You can use this for support while you get dressed. Do not have throw rugs and other things on the floor that can make you trip. What can I do in the kitchen? Clean up any spills right away. Avoid walking on wet floors. Keep items that you use a lot in easy-to-reach places. If you need to reach something above you, use a strong step stool that has a grab bar. Keep electrical cords out of the way. Do not use floor polish or wax that makes floors slippery. If you must use wax, use non-skid floor wax. Do not have throw rugs and other things on the floor that can make you trip. What can I do with my stairs? Do not leave any items on the stairs. Make sure that there are handrails on both sides of the stairs and use them. Fix handrails that are broken or loose. Make sure that handrails are as long as the stairways. Check any carpeting to  make sure that it is firmly attached to the stairs. Fix any carpet that is loose or worn. Avoid having throw rugs at the top or bottom of the stairs. If you do have throw rugs, attach them to the floor with carpet tape. Make sure that you have a light switch at the top of the stairs and the bottom of the stairs. If you do not have them, ask someone to add them for you. What else can I do to help prevent falls? Wear shoes that: Do not have high heels. Have rubber bottoms. Are comfortable and fit you well. Are closed at the toe. Do not wear sandals. If you use a stepladder: Make sure that it is fully opened. Do not climb a closed  stepladder. Make sure that both sides of the stepladder are locked into place. Ask someone to hold it for you, if possible. Clearly mark and make sure that you can see: Any grab bars or handrails. First and last steps. Where the edge of each step is. Use tools that help you move around (mobility aids) if they are needed. These include: Canes. Walkers. Scooters. Crutches. Turn on the lights when you go into a dark area. Replace any light bulbs as soon as they burn out. Set up your furniture so you have a clear path. Avoid moving your furniture around. If any of your floors are uneven, fix them. If there are any pets around you, be aware of where they are. Review your medicines with your doctor. Some medicines can make you feel dizzy. This can increase your chance of falling. Ask your doctor what other things that you can do to help prevent falls. This information is not intended to replace advice given to you by your health care provider. Make sure you discuss any questions you have with your health care provider. Document Released: 01/09/2009 Document Revised: 08/21/2015 Document Reviewed: 04/19/2014 Elsevier Interactive Patient Education  2017 [employer].

## 2021-04-06 NOTE — Progress Notes (Signed)
Subjective:   [name] is a 71 y.o. female who presents for Medicare Annual (Subsequent) preventive examination.  I connected with [name] today by telephone and verified that I am speaking with the correct person using two identifiers. Location patient: home Location provider: work Persons participating in the virtual visit: patient, Marine scientist.    I discussed the limitations, risks, security and privacy concerns of performing an evaluation and management service by telephone and the availability of in person appointments. I also discussed with the patient that there may be a patient responsible charge related to this service. The patient expressed understanding and verbally consented to this telephonic visit.    Interactive audio and video telecommunications were attempted between this provider and patient, however failed, due to patient having technical difficulties OR patient did not have access to video capability.  We continued and completed visit with audio only.  Some vital signs may be absent or patient reported.   Time Spent with patient on telephone encounter: 25 minutes   Review of Systems     Cardiac Risk Factors include: advanced age (>62men, >79 women);hypertension;dyslipidemia;sedentary lifestyle     Objective:    Today's Vitals   04/06/21 1141 04/06/21 1142  Weight: 157 lb (71.2 kg)   Height: 5\' 1"  (1.549 m)   PainSc:  8    Body mass index is 29.66 kg/m.  Advanced Directives 04/06/2021 10/06/2020 10/01/2020 01/09/2020 01/08/2020 01/08/2020 12/30/2019  Does Patient Have a Medical Advance Directive? Yes Yes Yes No No No Yes  Type of [employer] of [place];Living will - - - -  Does patient want to make changes to medical advance directive? - No - Patient declined No - Patient declined - - - -  Copy of [place] in Chart? No - copy requested - - - - - -  Would patient like information on creating a  medical advance directive? - - - No - Patient declined - - -  Pre-existing out of facility DNR order (yellow form or pink MOST form) - - - - - - -    Current Medications (verified) Outpatient Encounter Medications as of 04/06/2021  Medication Sig   amLODipine (NORVASC) 5 MG tablet Take 1 tablet (5 mg total) by mouth daily.   clonazePAM (KLONOPIN) 1 MG tablet TAKE 1 TABLET BY MOUTH THREE TIMES A DAY AS NEEDED FOR ANXIETY   clotrimazole-betamethasone (LOTRISONE) cream Apply 1 application topically 2 (two) times daily.   docusate sodium (COLACE) 100 MG capsule TAKE 1 CAPSULE BY MOUTH TWICE A DAY   gabapentin (NEURONTIN) 100 MG capsule TAKE 1 CAPSULE (100 MG TOTAL) BY MOUTH THREE TIMES DAILY.   hydrOXYzine (ATARAX/VISTARIL) 10 MG tablet Take 10 mg by mouth 3 (three) times daily.   mirtazapine (REMERON) 45 MG tablet Take 45 mg by mouth at bedtime.    montelukast (SINGULAIR) 10 MG tablet TAKE 1 TABLET BY MOUTH EVERYDAY AT BEDTIME   Multiple Vitamin (MULTIVITAMIN) capsule Take 1 capsule by mouth daily. Dynamic brain   Nutritional Supplements (LIVER DEFENSE PO) Take by mouth.   omeprazole (PRILOSEC) 40 MG capsule Take 1 capsule (40 mg total) by mouth in the morning and at bedtime.   ondansetron (ZOFRAN-ODT) 4 MG disintegrating tablet Take 1 tablet (4 mg total) by mouth every 8 (eight) hours as needed for nausea or vomiting. Please call [phone] for more refills   Probiotic Product (PROBIOTIC DAILY PO) Take 1 capsule by mouth daily.  traZODone (DESYREL) 50 MG tablet Take 50-150 mg by mouth at bedtime as needed for sleep.    amphetamine-dextroamphetamine (ADDERALL) 20 MG tablet Take 20 mg by mouth daily.   linaclotide (LINZESS) 72 MCG capsule Twice a week   MAGNESIUM CITRATE PO Take by mouth. Take 4 to 6 capsules a day   No facility-administered encounter medications on file as of 04/06/2021.    Allergies (verified) Compazine [prochlorperazine], Morphine and related, and Trintellix  [vortioxetine]   History: Past Medical History:  Diagnosis Date   Allergy    seasonal   Anxiety    Arachnoiditis    Cataract    Depression    Elevated liver enzymes    Environmental allergies    GERD (gastroesophageal reflux disease)    H/O being hospitalized 03/2012   x1 week for nausea   History of chicken pox    Hyperlipidemia    Hypertension    Migraine with typical aura    resolved years ago   Nausea    chronic nausea    Neuromuscular disorder [facility])    Past Surgical History:  Procedure Laterality Date   ABDOMINOPLASTY  07/11/09   EUS N/A 05/05/2012   Procedure: UPPER ENDOSCOPIC ULTRASOUND (EUS) LINEAR;  Surgeon: Beryle Beams, MD;  Location: [facility];  Service: Endoscopy;  Laterality: N/A;   LAPAROSCOPIC CHOLECYSTECTOMY  2010   NASAL SINUS SURGERY     "Has had 2-3 surgeries over the years"   UMBILICAL HERNIA REPAIR N/A 10/06/2020   Procedure: UMBILICAL HERNIA REPAIR;  Surgeon: Coralie Keens, MD;  Location: [place];  Service: General;  Laterality: N/A;  LMA ANESTHESIA   Family History  Problem Relation Age of Onset   Alzheimer's disease Mother    Hypertension Mother    Migraines Mother    Hypertension Father    Other Father        ?carotid artery aneurysm   Cancer Brother        mouth and throat   Colon cancer Neg Hx    Social History   Socioeconomic History   Marital status: Married    Spouse name: Not on file   Number of children: Not on file   Years of education: Not on file   Highest education level: Not on file  Occupational History   Not on file  Tobacco Use   Smoking status: Former    Packs/day: 0.25    Years: 8.00    Pack years: 2.00    Types: Cigarettes    Quit date: 08/09/1986    Years since quitting: 34.6   Smokeless tobacco: Never  Vaping Use   Vaping Use: Never used  Substance and Sexual Activity   Alcohol use: No   Drug use: No   Sexual activity: Not Currently    Partners: Male    Birth  control/protection: Other-see comments, Post-menopausal    Comment: husband had vasectomy.   Other Topics Concern   Not on file  Social History Narrative   2 step daughters- 71 and 8   She worked as an Web designer.   Enjoys puzzles, counted cross stitch.   Completed 1 year of college   4 cats   Social Determinants of Health   Financial Resource Strain: Low Risk    Difficulty of Paying Living Expenses: Not hard at all  Food Insecurity: No Food Insecurity   Worried About Charity fundraiser in the Last Year: Never true   Minor in the  Last Year: Never true  Transportation Needs: No Transportation Needs   Lack of Transportation (Medical): No   Lack of Transportation (Non-Medical): No  Physical Activity: Inactive   Days of Exercise per Week: 0 days   Minutes of Exercise per Session: 0 min  Stress: No Stress Concern Present   Feeling of Stress : Only a little  Social Connections: Moderately Isolated   Frequency of Communication with Friends and Family: More than three times a week   Frequency of Social Gatherings with Friends and Family: Never   Attends Religious Services: Never   Marine scientist or Organizations: No   Attends Music therapist: Never   Marital Status: Married    Tobacco Counseling Counseling given: Not Answered   Clinical Intake:  Pre-visit preparation completed: Yes  Pain : 0-10 Pain Score: 8  Pain Type: Chronic pain Pain Location: Head (& sinus pain) Pain Onset: More than a month ago Pain Frequency: Intermittent     BMI - recorded: 29.66 Nutritional Status: BMI 25 -29 Overweight Nutritional Risks: None  How often do you need to have someone help you when you read instructions, pamphlets, or other written materials from your doctor or pharmacy?: 1 - Never  Diabetic?No  Interpreter Needed?: No  Information entered by :: Caroleen Hamman LPN   Activities of Daily Living In your present state of  health, do you have any difficulty performing the following activities: 04/06/2021 10/06/2020  Hearing? N N  Vision? N N  Difficulty concentrating or making decisions? N N  Walking or climbing stairs? Y N  Comment stairs sometimes -  Dressing or bathing? N N  Doing errands, shopping? N -  Preparing Food and eating ? N -  Using the Toilet? N -  In the past six months, have you accidently leaked urine? N -  Do you have problems with loss of bowel control? Y -  Managing your Medications? N -  Managing your Finances? N -  Housekeeping or managing your Housekeeping? N -  Some recent data might be hidden    Patient Care Team: [name], NP as PCP - General (Internal Medicine) Renee Pain, MD (Inactive) as Referring Physician (Plastic Surgery) Cleta Alberts, MD as Referring Physician (Neurology) Juanita Craver, MD as Consulting Physician (Gastroenterology)  Indicate any recent Medical Services you may have received from other than [facility] providers in the past year (date may be approximate).     Assessment:   This is a routine wellness examination for [name].  Hearing/Vision screen Hearing Screening - Comments:: No issues Vision Screening - Comments:: Last eye exam-10/2020-[facility]  Dietary issues and exercise activities discussed: Current Exercise Habits: The patient does not participate in regular exercise at present, Exercise limited by: orthopedic condition(s)   Goals Addressed               This Visit's Progress     Patient Stated     Would like to feel 100% (pt-stated)   Not on track      Depression Screen PHQ 2/9 Scores 04/06/2021 08/05/2020 09/20/2019 06/27/2018 11/11/2017 09/09/2017 12/15/2016  PHQ - 2 Score 0 2 1 2 2 1 6   PHQ- 9 Score - 8 - 10 12 - 21  Exception Documentation - - - - - - -    Fall Risk Fall Risk  04/06/2021 08/05/2020 09/20/2019 02/16/2019 10/30/2018  Falls in the past year? 0 1 0 1 (No Data)  Comment - - - Emmi Telephone  Survey: data to  providers prior to load Emmi Telephone Survey: data to providers prior to load  Number falls in past yr: 0 0 0 1 (No Data)  Comment - - - Emmi Telephone Survey Actual Response = 2 Emmi Telephone Survey Actual Response =   Injury with Fall? 0 1 0 0 -  Comment - bruising - - -  Follow up Falls prevention discussed - Education provided;Falls prevention discussed - -    FALL RISK PREVENTION PERTAINING TO THE HOME:  Any stairs in or around the home? Yes  If so, are there any without handrails? No  Home free of loose throw rugs in walkways, pet beds, electrical cords, etc? Yes  Adequate lighting in your home to reduce risk of falls? Yes   ASSISTIVE DEVICES UTILIZED TO PREVENT FALLS:  Life alert? No  Use of a cane, walker or w/c? No  Grab bars in the bathroom? Yes  Shower chair or bench in shower? No  Elevated toilet seat or a handicapped toilet? No   TIMED UP AND GO:  Was the test performed? No . Phone visit   Cognitive Function:Normal cognitive status assessed by this Nurse Health Advisor. No abnormalities found.   MMSE - Mini Mental State Exam 09/09/2017  Orientation to time 5  Orientation to Place 5  Registration 3  Attention/ Calculation 5  Recall 3  Language- name 2 objects 2  Language- repeat 1  Language- follow 3 step command 3  Language- read & follow direction 1  Write a sentence 1  Copy design 1  Total score 30        Immunizations Immunization History  Administered Date(s) Administered   Fluad Quad(high Dose 65+) 01/01/2021   Influenza, High Dose Seasonal PF 01/09/2016, 12/15/2016, 12/14/2017, 12/01/2018   Influenza,inj,Quad PF,6+ Mos 12/25/2013, 12/13/2014   Influenza-Unspecified 12/05/2019   PFIZER(Purple Top)SARS-COV-2 Vaccination 05/03/2019, 05/24/2019, 12/05/2019   Pfizer Covid-19 Vaccine Bivalent Booster 32yrs & up 01/17/2021   Pneumococcal Conjugate-13 02/17/2018   Pneumococcal Polysaccharide-23 12/13/2014   Td 06/06/2008   Zoster Recombinat  (Shingrix) 12/11/2018, 03/26/2019   Zoster, Live 01/15/2011    TDAP status: Due, Education has been provided regarding the importance of this vaccine. Advised may receive this vaccine at local pharmacy or Health Dept. Aware to provide a copy of the vaccination record if obtained from local pharmacy or Health Dept. Verbalized acceptance and understanding.  Flu Vaccine status: Up to date  Pneumococcal vaccine status: Up to date  Covid-19 vaccine status: Completed vaccines  Qualifies for Shingles Vaccine? No   Zostavax completed Yes   Shingrix Completed?: Yes  Screening Tests Health Maintenance  Topic Date Due   DEXA SCAN  12/28/2015   TETANUS/TDAP  06/07/2018   Pneumonia Vaccine 84+ Years old (3 - PPSV23 if available, else PCV20) 12/13/2019   MAMMOGRAM  02/24/2023   COLONOSCOPY (Pts 45-61yrs Insurance coverage will need to be confirmed)  05/27/2025   INFLUENZA VACCINE  Completed   COVID-19 Vaccine  Completed   Hepatitis C Screening  Completed   Zoster Vaccines- Shingrix  Completed   HPV VACCINES  Aged Out    Health Maintenance  Health Maintenance Due  Topic Date Due   DEXA SCAN  12/28/2015   TETANUS/TDAP  06/07/2018   Pneumonia Vaccine 48+ Years old (3 - PPSV23 if available, else PCV20) 12/13/2019    Colorectal cancer screening: Type of screening: Colonoscopy. Completed 05/28/2015. Repeat every 10 years  Mammogram status: Completed bilateral 02/23/2021. Repeat every year  Bone Density  status: Ordered today. Pt provided with contact info and advised to call to schedule appt.  Lung Cancer Screening: (Low Dose CT Chest recommended if Age 67-80 years, 30 pack-year currently smoking OR have quit w/in 15years.) does not qualify.     Additional Screening:  Hepatitis C Screening: Completed 06/13/2015  Vision Screening: Recommended annual ophthalmology exams for early detection of glaucoma and other disorders of the eye. Is the patient up to date with their annual eye exam?   Yes  Who is the provider or what is the name of the office in which the patient attends annual eye exams? [facility]   Dental Screening: Recommended annual dental exams for proper oral hygiene  Community Resource Referral / Chronic Care Management: CRR required this visit?  No   CCM required this visit?  No      Plan:     I have personally reviewed and noted the following in the patients chart:   Medical and social history Use of alcohol, tobacco or illicit drugs  Current medications and supplements including opioid prescriptions.  Functional ability and status Nutritional status Physical activity Advanced directives List of other physicians Hospitalizations, surgeries, and ER visits in previous 12 months Vitals Screenings to include cognitive, depression, and falls Referrals and appointments  In addition, I have reviewed and discussed with patient certain preventive protocols, quality metrics, and best practice recommendations. A written personalized care plan for preventive services as well as general preventive health recommendations were provided to patient.   Due to this being a telephonic visit, the after visit summary with patients personalized plan was offered to patient via mail or my-chart. Patient would like to access on my-chart.   Marta Antu, LPN   09/03/6718  Nurse Health Advisor  Nurse Notes: None

## 2021-04-06 NOTE — Telephone Encounter (Signed)
Pt would like to inform you that she takes dayquil in the mornings and nightquil at night.

## 2021-04-09 ENCOUNTER — Other Ambulatory Visit: Payer: Self-pay | Admitting: Gastroenterology

## 2021-04-09 DIAGNOSIS — K581 Irritable bowel syndrome with constipation: Secondary | ICD-10-CM

## 2021-04-13 ENCOUNTER — Encounter: Payer: Self-pay | Admitting: Family

## 2021-04-17 ENCOUNTER — Ambulatory Visit (INDEPENDENT_AMBULATORY_CARE_PROVIDER_SITE_OTHER): Payer: Medicare Other | Admitting: Family

## 2021-04-17 VITALS — BP 134/69 | HR 66 | Temp 98.6°F | Resp 16 | Wt 166.0 lb

## 2021-04-17 DIAGNOSIS — M25512 Pain in left shoulder: Secondary | ICD-10-CM | POA: Insufficient documentation

## 2021-04-17 NOTE — Progress Notes (Signed)
Subjective:     Patient ID: [name], female    DOB: [birth year], 71 y.o.   MRN: [id number]  Chief Complaint  Patient presents with   Shoulder Pain    Patient complains of left shoulder pain    HPI  Patient presents today with chief complaint of left sided shoulder pain. She reports that this has been an issue for a couple of years but has worsened. Reports that she saw an integrative medicine NP a few years back.  Reports that left shoulder pain was the worst over the holidays.  Had a steroid shot last year and it lasted about 6 months.  She is right handed.   Health Maintenance Due  Topic Date Due   DEXA SCAN  12/28/2015   TETANUS/TDAP  06/07/2018   Pneumonia Vaccine 60+ Years old (3 - PPSV23 if available, else PCV20) 12/13/2019    Past Medical History:  Diagnosis Date   Allergy    seasonal   Anxiety    Arachnoiditis    Cataract    Depression    Elevated liver enzymes    Environmental allergies    GERD (gastroesophageal reflux disease)    H/O being hospitalized 03/2012   x1 week for nausea   History of chicken pox    Hyperlipidemia    Hypertension    Migraine with typical aura    resolved years ago   Nausea    chronic nausea    Neuromuscular disorder ([facility])     Past Surgical History:  Procedure Laterality Date   ABDOMINOPLASTY  07/11/09   EUS N/A 05/05/2012   Procedure: UPPER ENDOSCOPIC ULTRASOUND (EUS) LINEAR;  Surgeon: Beryle Beams, MD;  Location: [facility];  Service: Endoscopy;  Laterality: N/A;   LAPAROSCOPIC CHOLECYSTECTOMY  2010   NASAL SINUS SURGERY     "Has had 2-3 surgeries over the years"   UMBILICAL HERNIA REPAIR N/A 10/06/2020   Procedure: UMBILICAL HERNIA REPAIR;  Surgeon: Coralie Keens, MD;  Location: [place];  Service: General;  Laterality: N/A;  LMA ANESTHESIA    Family History  Problem Relation Age of Onset   Alzheimer's disease Mother    Hypertension Mother    Migraines Mother    Hypertension Father    Other  Father        ?carotid artery aneurysm   Cancer Brother        mouth and throat   Colon cancer Neg Hx     Social History   Socioeconomic History   Marital status: Married    Spouse name: Not on file   Number of children: Not on file   Years of education: Not on file   Highest education level: Not on file  Occupational History   Not on file  Tobacco Use   Smoking status: Former    Packs/day: 0.25    Years: 8.00    Pack years: 2.00    Types: Cigarettes    Quit date: 08/09/1986    Years since quitting: 34.7   Smokeless tobacco: Never  Vaping Use   Vaping Use: Never used  Substance and Sexual Activity   Alcohol use: No   Drug use: No   Sexual activity: Not Currently    Partners: Male    Birth control/protection: Other-see comments, Post-menopausal    Comment: husband had vasectomy.   Other Topics Concern   Not on file  Social History Narrative   2 step daughters- 19 and 31   She worked as  an Web designer.   Enjoys puzzles, counted cross stitch.   Completed 1 year of college   4 cats   Social Determinants of Health   Financial Resource Strain: Low Risk    Difficulty of Paying Living Expenses: Not hard at all  Food Insecurity: No Food Insecurity   Worried About Charity fundraiser in the Last Year: Never true   Arboriculturist in the Last Year: Never true  Transportation Needs: No Transportation Needs   Lack of Transportation (Medical): No   Lack of Transportation (Non-Medical): No  Physical Activity: Inactive   Days of Exercise per Week: 0 days   Minutes of Exercise per Session: 0 min  Stress: No Stress Concern Present   Feeling of Stress : Only a little  Social Connections: Moderately Isolated   Frequency of Communication with Friends and Family: More than three times a week   Frequency of Social Gatherings with Friends and Family: Never   Attends Religious Services: Never   Marine scientist or Organizations: No   Attends Programme researcher, broadcasting/film/video: Never   Marital Status: Married  Human resources officer Violence: Not At Risk   Fear of Current or Ex-Partner: No   Emotionally Abused: No   Physically Abused: No   Sexually Abused: No    Outpatient Medications Prior to Visit  Medication Sig Dispense Refill   amLODipine (NORVASC) 5 MG tablet Take 1 tablet (5 mg total) by mouth daily. 90 tablet 1   clonazePAM (KLONOPIN) 1 MG tablet TAKE 1 TABLET BY MOUTH THREE TIMES A DAY AS NEEDED FOR ANXIETY 90 tablet 0   clotrimazole-betamethasone (LOTRISONE) cream Apply 1 application topically 2 (two) times daily. 30 g 0   docusate sodium (COLACE) 100 MG capsule Take 1 capsule (100 mg total) by mouth daily. 30 capsule 0   gabapentin (NEURONTIN) 100 MG capsule TAKE 1 CAPSULE (100 MG TOTAL) BY MOUTH THREE TIMES DAILY. 90 capsule 5   hydrOXYzine (ATARAX/VISTARIL) 10 MG tablet Take 10 mg by mouth 3 (three) times daily.     mirtazapine (REMERON) 45 MG tablet Take 45 mg by mouth at bedtime.      montelukast (SINGULAIR) 10 MG tablet TAKE 1 TABLET BY MOUTH EVERYDAY AT BEDTIME 90 tablet 1   Multiple Vitamin (MULTIVITAMIN) capsule Take 1 capsule by mouth daily. Dynamic brain     Nutritional Supplements (LIVER DEFENSE PO) Take by mouth.     omeprazole (PRILOSEC) 40 MG capsule Take 1 capsule (40 mg total) by mouth in the morning and at bedtime. 90 capsule 3   ondansetron (ZOFRAN-ODT) 4 MG disintegrating tablet Take 1 tablet (4 mg total) by mouth every 8 (eight) hours as needed for nausea or vomiting. Please call [phone] for more refills 20 tablet 0   Probiotic Product (PROBIOTIC DAILY PO) Take 1 capsule by mouth daily.      traZODone (DESYREL) 50 MG tablet Take 50-150 mg by mouth at bedtime as needed for sleep.      amphetamine-dextroamphetamine (ADDERALL) 20 MG tablet Take 20 mg by mouth daily.     linaclotide (LINZESS) 72 MCG capsule Twice a week 30 capsule 5   MAGNESIUM CITRATE PO Take by mouth. Take 4 to 6 capsules a day     No  facility-administered medications prior to visit.    Allergies  Allergen Reactions   Compazine [Prochlorperazine]     hyperactivity   Morphine And Related Hives and Itching   Trintellix [Vortioxetine] Nausea And Vomiting  ROS     Objective:    Physical Exam Constitutional:      Appearance: Normal appearance.  HENT:     Head: Normocephalic and atraumatic.  Pulmonary:     Effort: Pulmonary effort is normal.  Musculoskeletal:     Comments: Left shoulder tenderness to palpation.  Increased pain with "empty can test" Increased pain with abduction of left shoulder  Skin:    General: Skin is warm and dry.  Neurological:     Mental Status: She is alert.    BP 134/69 (BP Location: Right Arm, Patient Position: Sitting, Cuff Size: Small)    Pulse 66    Temp 98.6 F (37 C) (Oral)    Resp 16    Wt 166 lb (75.3 kg)    LMP 12/28/2011    SpO2 95%    BMI 31.37 kg/m  Wt Readings from Last 3 Encounters:  04/17/21 166 lb (75.3 kg)  04/06/21 157 lb (71.2 kg)  02/02/21 162 lb (73.5 kg)       Assessment & Plan:   Problem List Items Addressed This Visit       Unprioritized   Left shoulder pain - Primary    Uncontrolled. Recommended topical voltaren gel once daily at night. Tylenol as needed during the day. Refer to sports medicine for further evaluation.       Relevant Orders   Ambulatory referral to Sports Medicine   26 minutes spent on today's visit. The majority of this time was spent counseling the patient.   I am having Anthoney Harada maintain her clonazePAM, amphetamine-dextroamphetamine, traZODone, mirtazapine, clotrimazole-betamethasone, Probiotic Product (PROBIOTIC DAILY PO), multivitamin, hydrOXYzine, Nutritional Supplements (LIVER DEFENSE PO), omeprazole, amLODipine, MAGNESIUM CITRATE PO, gabapentin, montelukast, ondansetron, linaclotide, and docusate sodium.  No orders of the defined types were placed in this encounter.

## 2021-04-17 NOTE — Assessment & Plan Note (Signed)
Uncontrolled. Recommended topical voltaren gel once daily at night. Tylenol as needed during the day. Refer to sports medicine for further evaluation.

## 2021-04-17 NOTE — Patient Instructions (Signed)
You may continue voltaren gel to left shoulder once daily and as needed.

## 2021-04-21 ENCOUNTER — Ambulatory Visit: Payer: Self-pay

## 2021-04-21 ENCOUNTER — Encounter: Payer: Self-pay | Admitting: Family Medicine

## 2021-04-21 ENCOUNTER — Ambulatory Visit (HOSPITAL_BASED_OUTPATIENT_CLINIC_OR_DEPARTMENT_OTHER)
Admission: RE | Admit: 2021-04-21 | Discharge: 2021-04-21 | Disposition: A | Discharge status: Home / Self Care | Payer: Medicare Other | Source: Ambulatory Visit | Attending: Family Medicine | Admitting: Family Medicine

## 2021-04-21 ENCOUNTER — Other Ambulatory Visit: Payer: Self-pay

## 2021-04-21 ENCOUNTER — Ambulatory Visit: Payer: Medicare Other | Admitting: Family Medicine

## 2021-04-21 VITALS — BP 130/90 | Ht 61.0 in | Wt 166.0 lb

## 2021-04-21 DIAGNOSIS — M25412 Effusion, left shoulder: Secondary | ICD-10-CM | POA: Insufficient documentation

## 2021-04-21 DIAGNOSIS — M25512 Pain in left shoulder: Secondary | ICD-10-CM

## 2021-04-21 DIAGNOSIS — M19012 Primary osteoarthritis, left shoulder: Secondary | ICD-10-CM | POA: Diagnosis not present

## 2021-04-21 MED ORDER — TRIAMCINOLONE ACETONIDE 40 MG/ML IJ SUSP
40.0000 mg | Freq: Once | INTRAMUSCULAR | Status: AC
  Administered 2021-04-21: 12:00:00 40 mg via INTRA_ARTICULAR

## 2021-04-21 NOTE — Progress Notes (Signed)
[name] - 71 y.o. female MRN [id number]  Date of birth: 1950-09-25  SUBJECTIVE:  Including CC & ROS.  [name] chief complaint on file.   [name] is a 71 y.o. female that is presenting with acute left shoulder pain.  The pain has been ongoing for a few days.  Has gotten some improvement with Voltaren.  Seems worse with certain movements.  [name] history of surgery.  Review of the note from 1/20 shows that she was provided topical Voltaren and advised on Tylenol.   Review of Systems See HPI   HISTORY: Past Medical, Surgical, Social, and Family History Reviewed & Updated per EMR.   Pertinent Historical Findings include:  Past Medical History:  Diagnosis Date   Allergy    seasonal   Anxiety    Arachnoiditis    Cataract    Depression    Elevated liver enzymes    Environmental allergies    GERD (gastroesophageal reflux disease)    H/O being hospitalized 03/2012   x1 week for nausea   History of chicken pox    Hyperlipidemia    Hypertension    Migraine with typical aura    resolved years ago   Nausea    chronic nausea    Neuromuscular disorder [facility])     Past Surgical History:  Procedure Laterality Date   ABDOMINOPLASTY  07/11/09   EUS N/A 05/05/2012   Procedure: UPPER ENDOSCOPIC ULTRASOUND (EUS) LINEAR;  Surgeon: Beryle Beams, MD;  Location: [facility];  Service: Endoscopy;  Laterality: N/A;   LAPAROSCOPIC CHOLECYSTECTOMY  2010   NASAL SINUS SURGERY     "Has had 2-3 surgeries over the years"   UMBILICAL HERNIA REPAIR N/A 10/06/2020   Procedure: UMBILICAL HERNIA REPAIR;  Surgeon: Coralie Keens, MD;  Location: [place];  Service: General;  Laterality: N/A;  LMA ANESTHESIA     PHYSICAL EXAM:  VS: BP 130/90 (BP Location: Right Arm, Patient Position: Sitting)    Ht 5\' 1"  (1.549 m)    Wt 166 lb (75.3 kg)    LMP 12/28/2011    BMI 31.37 kg/m  Physical Exam Gen: NAD, alert, cooperative with exam, well-appearing MSK:  Neurovascularly intact    Limited  ultrasound: Left shoulder:  Mild effusion at the superior portion of the bicipital sheath. Overlying effusion of the subscapularis. Bursitis appreciated of the subacromial space Effusion in the posterior glenohumeral joint  Summary: Effusion of the shoulder joint.  Ultrasound and interpretation by Clearance Coots, MD   Aspiration/Injection Procedure Note [name] Jan 14, 1951  Procedure: Injection Indications: Left shoulder pain  Procedure Details Consent: Risks of procedure as well as the alternatives and risks of each were explained to the (patient/caregiver).  Consent for procedure obtained. Time Out: Verified patient identification, verified procedure, site/side was marked, verified correct patient position, special equipment/implants available, medications/allergies/relevent history reviewed, required imaging and test results available.  Performed.  The area was cleaned with iodine and alcohol swabs.    The left glenohumeral space was injected using 3 cc of 1% lidocaine on a 22-gauge 3-1/2 inch needle.  The syringe was switched and a mixture containing 1 cc's of 40 mg Kenalog and 4 cc's of 0.25% bupivacaine was injected.  Ultrasound was used. Images were obtained in short views showing the injection.     A sterile dressing was applied.  Patient did tolerate procedure well.    ASSESSMENT & PLAN:   Effusion of joint of left shoulder Having a fluid around the joint that is  likely associated with either degenerative changes of the labrum or the joint itself. -Counseled on home exercise therapy and supportive care. -Injection. -X-ray. -Could consider physical therapy.

## 2021-04-21 NOTE — Patient Instructions (Signed)
Nice to meet you Please try heat before exercise and ice after  Please try the exercises   I will call with the results from today.  Please send me a message in MyChart with any questions or updates.  Please see me back in 4 weeks.   --Dr. Raeford Razor

## 2021-04-21 NOTE — Assessment & Plan Note (Signed)
Having a fluid around the joint that is likely associated with either degenerative changes of the labrum or the joint itself. -Counseled on home exercise therapy and supportive care. -Injection. -X-ray. -Could consider physical therapy.

## 2021-04-22 ENCOUNTER — Telehealth: Payer: Self-pay | Admitting: Family Medicine

## 2021-04-22 NOTE — Telephone Encounter (Signed)
Pt informed of below.  

## 2021-04-22 NOTE — Telephone Encounter (Signed)
Unable to leave VM for patient. If she calls back please have her speak with a nurse/CMA and inform that her xray is normal.   If any questions then please take the best time and phone number to call and I will try to call her back.   Rosemarie Ax, MD [facility] 04/22/2021, 11:40 AM

## 2021-04-24 ENCOUNTER — Other Ambulatory Visit: Payer: Self-pay | Admitting: Gastroenterology

## 2021-04-24 DIAGNOSIS — K581 Irritable bowel syndrome with constipation: Secondary | ICD-10-CM

## 2021-04-28 ENCOUNTER — Other Ambulatory Visit (HOSPITAL_BASED_OUTPATIENT_CLINIC_OR_DEPARTMENT_OTHER): Payer: Medicare Other

## 2021-04-29 ENCOUNTER — Encounter: Payer: Self-pay | Admitting: Family Medicine

## 2021-05-01 ENCOUNTER — Other Ambulatory Visit: Payer: Self-pay | Admitting: Gastroenterology

## 2021-05-05 ENCOUNTER — Encounter: Payer: Self-pay | Admitting: Gastroenterology

## 2021-05-05 ENCOUNTER — Ambulatory Visit (HOSPITAL_BASED_OUTPATIENT_CLINIC_OR_DEPARTMENT_OTHER)
Admission: RE | Admit: 2021-05-05 | Discharge: 2021-05-05 | Disposition: A | Discharge status: Home / Self Care | Payer: Medicare Other | Source: Ambulatory Visit | Attending: Family | Admitting: Family

## 2021-05-05 ENCOUNTER — Other Ambulatory Visit: Payer: Self-pay

## 2021-05-05 DIAGNOSIS — Z780 Asymptomatic menopausal state: Secondary | ICD-10-CM | POA: Insufficient documentation

## 2021-05-05 DIAGNOSIS — M85852 Other specified disorders of bone density and structure, left thigh: Secondary | ICD-10-CM | POA: Diagnosis not present

## 2021-05-05 DIAGNOSIS — K581 Irritable bowel syndrome with constipation: Secondary | ICD-10-CM

## 2021-05-15 NOTE — Telephone Encounter (Signed)
Docusate is over-the-counter and not prescription RG

## 2021-05-17 ENCOUNTER — Other Ambulatory Visit: Payer: Self-pay | Admitting: Gastroenterology

## 2021-05-18 MED ORDER — DOCUSATE SODIUM 100 MG PO CAPS: 100.0000 mg | ORAL_CAPSULE | Freq: Two times a day (BID) | ORAL | 3 refills | Status: DC

## 2021-05-19 ENCOUNTER — Ambulatory Visit: Payer: Medicare Other | Admitting: Family Medicine

## 2021-06-11 DIAGNOSIS — L821 Other seborrheic keratosis: Secondary | ICD-10-CM | POA: Diagnosis not present

## 2021-06-11 DIAGNOSIS — Z23 Encounter for immunization: Secondary | ICD-10-CM | POA: Diagnosis not present

## 2021-06-11 DIAGNOSIS — L814 Other melanin hyperpigmentation: Secondary | ICD-10-CM | POA: Diagnosis not present

## 2021-06-11 DIAGNOSIS — L578 Other skin changes due to chronic exposure to nonionizing radiation: Secondary | ICD-10-CM | POA: Diagnosis not present

## 2021-06-11 DIAGNOSIS — D225 Melanocytic nevi of trunk: Secondary | ICD-10-CM | POA: Diagnosis not present

## 2021-06-11 DIAGNOSIS — L820 Inflamed seborrheic keratosis: Secondary | ICD-10-CM | POA: Diagnosis not present

## 2021-06-14 ENCOUNTER — Encounter: Payer: Self-pay | Admitting: Family

## 2021-06-17 ENCOUNTER — Encounter: Payer: Self-pay | Admitting: Family

## 2021-07-06 ENCOUNTER — Encounter: Payer: Self-pay | Admitting: Gastroenterology

## 2021-07-06 ENCOUNTER — Other Ambulatory Visit: Payer: Self-pay | Admitting: Gastroenterology

## 2021-07-07 ENCOUNTER — Encounter: Payer: Self-pay | Admitting: Family

## 2021-07-07 ENCOUNTER — Other Ambulatory Visit: Payer: Self-pay

## 2021-07-07 ENCOUNTER — Ambulatory Visit (INDEPENDENT_AMBULATORY_CARE_PROVIDER_SITE_OTHER): Payer: Medicare Other | Admitting: Family

## 2021-07-07 DIAGNOSIS — D696 Thrombocytopenia, unspecified: Secondary | ICD-10-CM | POA: Diagnosis not present

## 2021-07-07 DIAGNOSIS — F32A Depression, unspecified: Secondary | ICD-10-CM

## 2021-07-07 DIAGNOSIS — R112 Nausea with vomiting, unspecified: Secondary | ICD-10-CM

## 2021-07-07 DIAGNOSIS — K219 Gastro-esophageal reflux disease without esophagitis: Secondary | ICD-10-CM

## 2021-07-07 DIAGNOSIS — E669 Obesity, unspecified: Secondary | ICD-10-CM

## 2021-07-07 DIAGNOSIS — R110 Nausea: Secondary | ICD-10-CM

## 2021-07-07 DIAGNOSIS — E781 Pure hyperglyceridemia: Secondary | ICD-10-CM | POA: Diagnosis not present

## 2021-07-07 DIAGNOSIS — E66811 Obesity, class 1: Secondary | ICD-10-CM | POA: Insufficient documentation

## 2021-07-07 DIAGNOSIS — M25412 Effusion, left shoulder: Secondary | ICD-10-CM

## 2021-07-07 DIAGNOSIS — F419 Anxiety disorder, unspecified: Secondary | ICD-10-CM | POA: Diagnosis not present

## 2021-07-07 DIAGNOSIS — I10 Essential (primary) hypertension: Secondary | ICD-10-CM

## 2021-07-07 LAB — COMPREHENSIVE METABOLIC PANEL
ALT: 13 U/L (ref 0–35)
AST: 17 U/L (ref 0–37)
Albumin: 4.3 g/dL (ref 3.5–5.2)
Alkaline Phosphatase: 79 U/L (ref 39–117)
BUN: 19 mg/dL (ref 6–23)
CO2: 30 mEq/L (ref 19–32)
Calcium: 9.2 mg/dL (ref 8.4–10.5)
Chloride: 102 mEq/L (ref 96–112)
Creatinine, Ser: 0.91 mg/dL (ref 0.40–1.20)
GFR: 63.91 mL/min (ref >60.00)
Glucose, Bld: 98 mg/dL (ref 70–99)
Potassium: 4.1 mEq/L (ref 3.5–5.1)
Sodium: 138 mEq/L (ref 135–145)
Total Bilirubin: 0.5 mg/dL (ref 0.2–1.2)
Total Protein: 6.6 g/dL (ref 6.0–8.3)

## 2021-07-07 LAB — CBC WITH DIFFERENTIAL/PLATELET
Basophils Absolute: 0 10*3/uL (ref 0.0–0.1)
Basophils Relative: 0.5 % (ref 0.0–3.0)
Eosinophils Absolute: 0.3 10*3/uL (ref 0.0–0.7)
Eosinophils Relative: 3.7 % (ref 0.0–5.0)
HCT: 40.8 % (ref 36.0–46.0)
Hemoglobin: 13.9 g/dL (ref 12.0–15.0)
Lymphocytes Relative: 19.2 % (ref 12.0–46.0)
Lymphs Abs: 1.6 10*3/uL (ref 0.7–4.0)
MCHC: 34.1 g/dL (ref 30.0–36.0)
MCV: 91.4 fl (ref 78.0–100.0)
Monocytes Absolute: 0.7 10*3/uL (ref 0.1–1.0)
Monocytes Relative: 8.1 % (ref 3.0–12.0)
Neutro Abs: 5.8 10*3/uL (ref 1.4–7.7)
Neutrophils Relative %: 68.5 % (ref 43.0–77.0)
Platelets: 117 10*3/uL — ABNORMAL LOW (ref 150.0–400.0)
RBC: 4.47 Mil/uL (ref 3.87–5.11)
RDW: 13.6 % (ref 11.5–15.5)
WBC: 8.5 10*3/uL (ref 4.0–10.5)

## 2021-07-07 MED ORDER — ONDANSETRON 4 MG PO TBDP: 4.0000 mg | ORAL_TABLET | Freq: Three times a day (TID) | ORAL | 0 refills | Status: DC | PRN

## 2021-07-07 MED ORDER — ESCITALOPRAM OXALATE 10 MG PO TABS: 10.0000 mg | ORAL_TABLET | Freq: Every day | ORAL | Status: AC

## 2021-07-07 NOTE — Progress Notes (Signed)
? ?Subjective:  ? ?By signing my name below, I, [name], attest that this documentation has been prepared under the direction and in the presence of [name], 07/07/2021 ? ? Patient ID: [name], female    DOB: 06-Dec-1950, 71 y.o.   MRN: [id number] ? ?Chief Complaint  ?Patient presents with  ? Hypertension  ?  Here for follow up  ? ? ?HPI ?Patient is in today for an office visit.  ? ?Back Pain/arachnoiditis- Patient complains of worsening back pain. She is also experiencing elevated headaches. She is not interested in seeing a specialist for her symptoms.  ? ?Left Shoulder Pain - She complains of persistent left shoulder pain. She went to see Dr. Raeford Razor on 04/21/2021 and was diagnosed with fluid in her shoulder.  ? ?Blood Pressure - She is currently taking 5 MG of Amlodipine. ?BP Readings from Last 3 Encounters:  ?07/07/21 129/71  ?04/21/21 130/90  ?04/17/21 134/69  ? ? ?Mood - Her mood has not been doing well. She sees a specialist and is currently taking Escitalopram, trazodone and remeron.  ? ?Reflux - Her reflux has been well. Her symptom occasionally worsens during the evening. She takes 40 MG of Omeprazole.  ? ?Platelets - She states that she bruises easily but she believes that it could be due to older age. ? ?Chronic Nausea - Her nausea has not been well. She has contacted Dr. Lyndel Safe and requested a refill of 4 MG of Ondansetron.  ? ?Adderall - She regularly takes 20 MG of Addreall. She states that situations regarding her daughter is worsening her anxiety.  ? ?Weight Loss - She is interested in using [facility] for weight loss. She is eating a healthy diet.  ? ?Health Maintenance Due  ?Topic Date Due  ? TETANUS/TDAP  06/07/2018  ? Pneumonia Vaccine 66+ Years old (3 - PPSV23 if available, else PCV20) 12/13/2019  ? ? ?Past Medical History:  ?Diagnosis Date  ? AKI (acute kidney injury) ([place]) 01/09/2020  ? Allergy   ? seasonal  ? Anxiety   ? Arachnoiditis   ? Cataract   ? Depression   ?  Elevated liver enzymes   ? Environmental allergies   ? GERD (gastroesophageal reflux disease)   ? H/O being hospitalized 03/2012  ? x1 week for nausea  ? History of chicken pox   ? Hyperlipidemia   ? Hypertension   ? Migraine with typical aura   ? resolved years ago  ? Nausea   ? chronic nausea   ? Neuromuscular disorder ([place])   ? ? ?Past Surgical History:  ?Procedure Laterality Date  ? ABDOMINOPLASTY  07/11/09  ? EUS N/A 05/05/2012  ? Procedure: UPPER ENDOSCOPIC ULTRASOUND (EUS) LINEAR;  Surgeon: Beryle Beams, MD;  [facility];  Service: Endoscopy;  Laterality: N/A;  ? LAPAROSCOPIC CHOLECYSTECTOMY  2010  ? NASAL SINUS SURGERY    ? "Has had 2-3 surgeries over the years"  ? UMBILICAL HERNIA REPAIR N/A 10/06/2020  ? Procedure: UMBILICAL HERNIA REPAIR;  Surgeon: Coralie Keens, MD;  Location: [place];  Service: General;  Laterality: N/A;  LMA ANESTHESIA  ? ? ?Family History  ?Problem Relation Age of Onset  ? Alzheimer's disease Mother   ? Hypertension Mother   ? Migraines Mother   ? Hypertension Father   ? Other Father   ?     ?carotid artery aneurysm  ? Cancer Brother   ?     mouth and throat  ? Colon cancer  Neg Hx   ? ? ?Social History  ? ?Socioeconomic History  ? Marital status: Married  ?  Spouse name: Not on file  ? Number of children: Not on file  ? Years of education: Not on file  ? Highest education level: Not on file  ?Occupational History  ? Not on file  ?Tobacco Use  ? Smoking status: Former  ?  Packs/day: 0.25  ?  Years: 8.00  ?  Pack years: 2.00  ?  Types: Cigarettes  ?  Quit date: 08/09/1986  ?  Years since quitting: 34.9  ? Smokeless tobacco: Never  ?Vaping Use  ? Vaping Use: Never used  ?Substance and Sexual Activity  ? Alcohol use: No  ? Drug use: No  ? Sexual activity: Not Currently  ?  Partners: Male  ?  Birth control/protection: Other-see comments, Post-menopausal  ?  Comment: husband had vasectomy.   ?Other Topics Concern  ? Not on file  ?Social History Narrative  ? 2  step daughters- 49 and 66  ? She worked as an Web designer.  ? Enjoys puzzles, counted cross stitch.  ? Completed 1 year of college  ? 4 cats  ? ?Social Determinants of Health  ? ?Financial Resource Strain: Low Risk   ? Difficulty of Paying Living Expenses: Not hard at all  ?Food Insecurity: No Food Insecurity  ? Worried About Charity fundraiser in the Last Year: Never true  ? Ran Out of Food in the Last Year: Never true  ?Transportation Needs: No Transportation Needs  ? Lack of Transportation (Medical): No  ? Lack of Transportation (Non-Medical): No  ?Physical Activity: Inactive  ? Days of Exercise per Week: 0 days  ? Minutes of Exercise per Session: 0 min  ?Stress: No Stress Concern Present  ? Feeling of Stress : Only a little  ?Social Connections: Moderately Isolated  ? Frequency of Communication with Friends and Family: More than three times a week  ? Frequency of Social Gatherings with Friends and Family: Never  ? Attends Religious Services: Never  ? Active Member of Clubs or Organizations: No  ? Attends Archivist Meetings: Never  ? Marital Status: Married  ?Intimate Partner Violence: Not At Risk  ? Fear of Current or Ex-Partner: No  ? Emotionally Abused: No  ? Physically Abused: No  ? Sexually Abused: No  ? ? ?Outpatient Medications Prior to Visit  ?Medication Sig Dispense Refill  ? amLODipine (NORVASC) 5 MG tablet Take 1 tablet (5 mg total) by mouth daily. 90 tablet 1  ? amphetamine-dextroamphetamine (ADDERALL) 20 MG tablet Take 20 mg by mouth daily.    ? clonazePAM (KLONOPIN) 1 MG tablet TAKE 1 TABLET BY MOUTH THREE TIMES A DAY AS NEEDED FOR ANXIETY 90 tablet 0  ? docusate sodium (COLACE) 100 MG capsule Take 1 capsule (100 mg total) by mouth 2 (two) times daily. 180 capsule 3  ? gabapentin (NEURONTIN) 100 MG capsule TAKE 1 CAPSULE (100 MG TOTAL) BY MOUTH THREE TIMES DAILY. 90 capsule 5  ? linaclotide (LINZESS) 72 MCG capsule Twice a week 30 capsule 5  ? mirtazapine (REMERON) 45 MG  tablet Take 45 mg by mouth at bedtime.     ? montelukast (SINGULAIR) 10 MG tablet TAKE 1 TABLET BY MOUTH EVERYDAY AT BEDTIME 90 tablet 1  ? Multiple Vitamin (MULTIVITAMIN) capsule Take 1 capsule by mouth daily. Dynamic brain    ? Nutritional Supplements (LIVER DEFENSE PO) Take by mouth.    ? omeprazole (  PRILOSEC) 40 MG capsule TAKE 1 CAPSULE (40 MG TOTAL) BY MOUTH IN THE MORNING AND AT BEDTIME. 180 capsule 1  ? Probiotic Product (PROBIOTIC DAILY PO) Take 1 capsule by mouth daily.     ? traZODone (DESYREL) 50 MG tablet Take 50-150 mg by mouth at bedtime as needed for sleep.     ? ondansetron (ZOFRAN-ODT) 4 MG disintegrating tablet Take 1 tablet (4 mg total) by mouth every 8 (eight) hours as needed for nausea or vomiting. 20 tablet 0  ? clotrimazole-betamethasone (LOTRISONE) cream Apply 1 application topically 2 (two) times daily. 30 g 0  ? hydrOXYzine (ATARAX/VISTARIL) 10 MG tablet Take 10 mg by mouth 3 (three) times daily.    ? MAGNESIUM CITRATE PO Take by mouth. Take 4 to 6 capsules a day    ? ?No facility-administered medications prior to visit.  ? ? ?Allergies  ?Allergen Reactions  ? Compazine [Prochlorperazine]   ?  hyperactivity  ? Morphine And Related Hives and Itching  ? Trintellix [Vortioxetine] Nausea And Vomiting  ? ? ?Review of Systems  ?Musculoskeletal:  Positive for back pain and joint pain (Left Shoulder).  ?Neurological:  Positive for headaches.  ?Psychiatric/Behavioral:  Positive for depression. The patient is nervous/anxious.   ? ?   ?Objective:  ?  ?Physical Exam ?Constitutional:   ?   General: She is not in acute distress. ?   Appearance: Normal appearance. She is not ill-appearing.  ?HENT:  ?   Head: Normocephalic and atraumatic.  ?   Right Ear: External ear normal.  ?   Left Ear: External ear normal.  ?Eyes:  ?   Extraocular Movements: Extraocular movements intact.  ?   Pupils: Pupils are equal, round, and reactive to light.  ?Cardiovascular:  ?   Rate and Rhythm: Normal rate and regular rhythm.   ?   Heart sounds: Normal heart sounds. No murmur heard. ?  No gallop.  ?Pulmonary:  ?   Effort: Pulmonary effort is normal. No respiratory distress.  ?   Breath sounds: Normal breath sounds. No wheezing or ra

## 2021-07-07 NOTE — Assessment & Plan Note (Signed)
Stable with omeprazole, overall stable. Using tums prn some nights. ?

## 2021-07-07 NOTE — Assessment & Plan Note (Signed)
Lab Results  ?Component Value Date  ? CHOL 200 12/30/2020  ? HDL 50.60 12/30/2020  ? LDLCALC 116 (H) 06/13/2015  ? LDLDIRECT 110.0 12/30/2020  ? TRIG 228.0 (H) 12/30/2020  ? CHOLHDL 4 12/30/2020  ? ?Continue reduced carb diet.  ?

## 2021-07-07 NOTE — Assessment & Plan Note (Signed)
BP Readings from Last 3 Encounters:  ?07/07/21 129/71  ?04/21/21 130/90  ?04/17/21 134/69  ? ?BP stable. Continue amlodipine '5mg'$    ?

## 2021-07-07 NOTE — Assessment & Plan Note (Signed)
Followed by Dr. Toy Care.  States that she stopped atarax.  Using trazodone 3 tabs HS and remeron HS. ?

## 2021-07-07 NOTE — Assessment & Plan Note (Signed)
She is following with GI.  Continue zofran prn.   ?

## 2021-07-07 NOTE — Assessment & Plan Note (Signed)
Lab Results  ?Component Value Date  ? WBC 10.0 12/30/2020  ? HGB 14.4 12/30/2020  ? HCT 43.7 12/30/2020  ? MCV 90.6 12/30/2020  ? PLT 194.0 12/30/2020  ? ?Clinically stable.  Obtain CBC.   ?

## 2021-07-07 NOTE — Assessment & Plan Note (Signed)
Some improvement.  Management per Sports med. ?

## 2021-07-07 NOTE — Patient Instructions (Signed)
Please complete lab work prior to leaving.   

## 2021-07-07 NOTE — Assessment & Plan Note (Signed)
Discussed that [name] is not covered by her insurance. Besides it would worsen her GI symptoms. Discussed dietary strategies to help with weight loss.  ?

## 2021-07-09 ENCOUNTER — Encounter: Payer: Self-pay | Admitting: Family

## 2021-07-10 ENCOUNTER — Telehealth (INDEPENDENT_AMBULATORY_CARE_PROVIDER_SITE_OTHER): Payer: Medicare Other | Admitting: Family

## 2021-07-10 ENCOUNTER — Other Ambulatory Visit: Payer: Self-pay | Admitting: Family

## 2021-07-10 VITALS — BP 96/43 | HR 70

## 2021-07-10 DIAGNOSIS — B349 Viral infection, unspecified: Secondary | ICD-10-CM | POA: Diagnosis not present

## 2021-07-10 MED ORDER — BENZONATATE 100 MG PO CAPS: 100.0000 mg | ORAL_CAPSULE | Freq: Three times a day (TID) | ORAL | 0 refills | Status: DC | PRN

## 2021-07-10 MED ORDER — AZITHROMYCIN 250 MG PO TABS: ORAL_TABLET | ORAL | 0 refills | Status: AC

## 2021-07-10 NOTE — Telephone Encounter (Signed)
I think this patient needs an OV please either here today or at urgent care.  ?

## 2021-07-10 NOTE — Assessment & Plan Note (Addendum)
Patient has multiple symptoms.  Overall seems viral, but I am concerned about the possibility of PNA and sinusitis. Will plan empiric abx (zpak) to cover lung/sinus. ? ?Pt is advised as follows: ?  ?Tessalon or Delsym as needed for cough.   ?Start azithromycin. ?Hold amlodipine for the next few days until you are feeling better. ?Push fluids today.  Aim for 64+ oz of water or gatorade between now and bedtime. ?If symptoms worsen, call 911 to be transported to the hospital. ?If symptoms do not improve over the weekend, please schedule an appointment with me in the office on Monday.  ? ?Patient verbalizes understanding and agrees to plan.  ? ?

## 2021-07-10 NOTE — Patient Instructions (Signed)
Tessalon or Delsym as needed for cough.   ?Start azithromycin. ?Hold amlodipine for the next few days until you are feeling better. ?Push fluids today.  Aim for 64+ oz of water or gatorade between now and bedtime. ?If symptoms worsen, call 911 to be transported to the hospital. ?If symptoms do not improve over the weekend, please schedule an appointment with me in the office on Monday.  ?

## 2021-07-10 NOTE — Progress Notes (Signed)
? ? ?MyChart Video Visit ? ? ? ?Virtual Visit via Video Note  ? ?This visit type was conducted due to national recommendations for restrictions regarding the COVID-19 Pandemic (e.g. social distancing) in an effort to limit this patient's exposure and mitigate transmission in our community. This patient is at least at moderate risk for complications without adequate follow up. This format is felt to be most appropriate for this patient at this time. Physical exam was limited by quality of the video and audio technology used for the visit. CMA was able to get the patient set up on a video visit. ? ?Patient location: Home. Patient and provider in visit ?Provider location: Office ? ?I discussed the limitations of evaluation and management by telemedicine and the availability of in person appointments. The patient expressed understanding and agreed to proceed. ? ?Visit Date: 07/10/2021 ? ?Today's healthcare provider: Nance Pear, NP  ? ? ? ?Subjective:  ? ? Patient ID: [name], female    DOB: [birth year], 71 y.o.   MRN: [id number] ? ?Chief Complaint  ?Patient presents with  ? Cough  ?  Complains of persistent productive cough  ? Back Pain  ?  Complains of back pain  ? ? ? ? ?Pt reports that symptoms began Wednesday night. Took cough medicine. She vomited x 4 that night. Yesterday AM she was coughing so bad that she was hearing rattling in her chest.  She is having back pain which has been exacerbated by coughing. Using a heating pad. Little to no appetite. No energy.  Took a covid test yesterday which was negative.  + HA, Sore throat, fatigue. Has a lot of sinus pressure and nasal drainage is green.  ? ?She is at home alone because her husband left to attend a wedding in [place].  ? ?BP Readings from Last 3 Encounters:  ?07/10/21 (!) 96/43  ?07/07/21 129/71  ?04/21/21 130/90  ? ?Intake today: 1 cup coffee and 1 bottle of water.  No vomiting overnight or today. She had diarrhea yesterday AM. Cleared up in the  afternoon.  Took a linzess last night and had 2 bm's this AM.  First was diarrhea and second was formed. She ate raisin bran for breakfast.  ? ?Took amlodipine this AM.   ? ?Past Medical History:  ?Diagnosis Date  ? AKI (acute kidney injury) ([place]) 01/09/2020  ? Allergy   ? seasonal  ? Anxiety   ? Arachnoiditis   ? Cataract   ? Depression   ? Elevated liver enzymes   ? Environmental allergies   ? GERD (gastroesophageal reflux disease)   ? H/O being hospitalized 03/2012  ? x1 week for nausea  ? History of chicken pox   ? Hyperlipidemia   ? Hypertension   ? Migraine with typical aura   ? resolved years ago  ? Nausea   ? chronic nausea   ? Neuromuscular disorder ([place])   ? ? ?Past Surgical History:  ?Procedure Laterality Date  ? ABDOMINOPLASTY  07/11/09  ? EUS N/A 05/05/2012  ? Procedure: UPPER ENDOSCOPIC ULTRASOUND (EUS) LINEAR;  Surgeon: Beryle Beams, MD;  [facility];  Service: Endoscopy;  Laterality: N/A;  ? LAPAROSCOPIC CHOLECYSTECTOMY  2010  ? NASAL SINUS SURGERY    ? "Has had 2-3 surgeries over the years"  ? UMBILICAL HERNIA REPAIR N/A 10/06/2020  ? Procedure: UMBILICAL HERNIA REPAIR;  Surgeon: Coralie Keens, MD;  Location: [place];  Service: General;  Laterality: N/A;  LMA ANESTHESIA  ? ? ?  Family History  ?Problem Relation Age of Onset  ? Alzheimer's disease Mother   ? Hypertension Mother   ? Migraines Mother   ? Hypertension Father   ? Other Father   ?     ?carotid artery aneurysm  ? Cancer Brother   ?     mouth and throat  ? Colon cancer Neg Hx   ? ? ?Social History  ? ?Socioeconomic History  ? Marital status: Married  ?  Spouse name: Not on file  ? Number of children: Not on file  ? Years of education: Not on file  ? Highest education level: Not on file  ?Occupational History  ? Not on file  ?Tobacco Use  ? Smoking status: Former  ?  Packs/day: 0.25  ?  Years: 8.00  ?  Pack years: 2.00  ?  Types: Cigarettes  ?  Quit date: 08/09/1986  ?  Years since quitting: 34.9  ? Smokeless  tobacco: Never  ?Vaping Use  ? Vaping Use: Never used  ?Substance and Sexual Activity  ? Alcohol use: No  ? Drug use: No  ? Sexual activity: Not Currently  ?  Partners: Male  ?  Birth control/protection: Other-see comments, Post-menopausal  ?  Comment: husband had vasectomy.   ?Other Topics Concern  ? Not on file  ?Social History Narrative  ? 2 step daughters- 44 and 67  ? She worked as an Web designer.  ? Enjoys puzzles, counted cross stitch.  ? Completed 1 year of college  ? 4 cats  ? ?Social Determinants of Health  ? ?Financial Resource Strain: Low Risk   ? Difficulty of Paying Living Expenses: Not hard at all  ?Food Insecurity: No Food Insecurity  ? Worried About Charity fundraiser in the Last Year: Never true  ? Ran Out of Food in the Last Year: Never true  ?Transportation Needs: No Transportation Needs  ? Lack of Transportation (Medical): No  ? Lack of Transportation (Non-Medical): No  ?Physical Activity: Inactive  ? Days of Exercise per Week: 0 days  ? Minutes of Exercise per Session: 0 min  ?Stress: No Stress Concern Present  ? Feeling of Stress : Only a little  ?Social Connections: Moderately Isolated  ? Frequency of Communication with Friends and Family: More than three times a week  ? Frequency of Social Gatherings with Friends and Family: Never  ? Attends Religious Services: Never  ? Active Member of Clubs or Organizations: No  ? Attends Archivist Meetings: Never  ? Marital Status: Married  ?Intimate Partner Violence: Not At Risk  ? Fear of Current or Ex-Partner: No  ? Emotionally Abused: No  ? Physically Abused: No  ? Sexually Abused: No  ? ? ?Outpatient Medications Prior to Visit  ?Medication Sig Dispense Refill  ? amLODipine (NORVASC) 5 MG tablet TAKE 1 TABLET (5 MG TOTAL) BY MOUTH DAILY. 90 tablet 1  ? amphetamine-dextroamphetamine (ADDERALL) 20 MG tablet Take 20 mg by mouth daily.    ? clonazePAM (KLONOPIN) 1 MG tablet TAKE 1 TABLET BY MOUTH THREE TIMES A DAY AS NEEDED FOR  ANXIETY 90 tablet 0  ? docusate sodium (COLACE) 100 MG capsule Take 1 capsule (100 mg total) by mouth 2 (two) times daily. 180 capsule 3  ? escitalopram (LEXAPRO) 10 MG tablet Take 1 tablet (10 mg total) by mouth daily.    ? gabapentin (NEURONTIN) 100 MG capsule TAKE 1 CAPSULE (100 MG TOTAL) BY MOUTH THREE TIMES DAILY. 90 capsule 5  ?  linaclotide (LINZESS) 72 MCG capsule Twice a week 30 capsule 5  ? mirtazapine (REMERON) 45 MG tablet Take 45 mg by mouth at bedtime.     ? montelukast (SINGULAIR) 10 MG tablet TAKE 1 TABLET BY MOUTH EVERYDAY AT BEDTIME 90 tablet 1  ? Multiple Vitamin (MULTIVITAMIN) capsule Take 1 capsule by mouth daily. Dynamic brain    ? Nutritional Supplements (LIVER DEFENSE PO) Take by mouth.    ? omeprazole (PRILOSEC) 40 MG capsule TAKE 1 CAPSULE (40 MG TOTAL) BY MOUTH IN THE MORNING AND AT BEDTIME. 180 capsule 1  ? ondansetron (ZOFRAN-ODT) 4 MG disintegrating tablet Take 1 tablet (4 mg total) by mouth every 8 (eight) hours as needed for nausea or vomiting. 20 tablet 0  ? Probiotic Product (PROBIOTIC DAILY PO) Take 1 capsule by mouth daily.     ? traZODone (DESYREL) 50 MG tablet Take 50-150 mg by mouth at bedtime as needed for sleep.     ? ?No facility-administered medications prior to visit.  ? ? ?Allergies  ?Allergen Reactions  ? Compazine [Prochlorperazine]   ?  hyperactivity  ? Morphine And Related Hives and Itching  ? Trintellix [Vortioxetine] Nausea And Vomiting  ? ? ?Review of Systems  ?Respiratory:  Positive for cough.   ?Musculoskeletal:  Positive for back pain.  ? ?  See HPI ?Objective:  ?  ?Physical Exam ?Constitutional:   ?   Comments: Appears tired but in NAD  ?HENT:  ?   Head: Normocephalic.  ?Skin: ?   Coloration: Skin is not jaundiced.  ?Neurological:  ?   Mental Status: She is alert and oriented to person, place, and time.  ?Psychiatric:     ?   Mood and Affect: Mood normal.     ?   Behavior: Behavior normal.     ?   Thought Content: Thought content normal.     ?   Judgment:  Judgment normal.  ? ? ?BP (!) 96/43   Pulse 70   LMP 12/28/2011  ?Wt Readings from Last 3 Encounters:  ?07/07/21 169 lb (76.7 kg)  ?04/21/21 166 lb (75.3 kg)  ?04/17/21 166 lb (75.3 kg)  ? ? ?   ?Assessment & Plan:  ?

## 2021-07-10 NOTE — Telephone Encounter (Signed)
Good morning,  ? ?The pt sent a MyChart message yesterday stating: "I was just in on Tuesday I seemed to be ok.  Now I?m suppose to go to [facility] and yesterday I started with my back being very sore!!!!  I?m coughing, vomiting, severe headache.  I feel miserable.  My sinuses are very swollen.  I took some DayQuill and I threw up that and mucus.  I will not be able to travel with my husband.  If you can suggest anything that would be much appreciated. ?Thank you" ? ?I asked her to test for covid- it was negative- would you recommend an ov? ?

## 2021-07-10 NOTE — Telephone Encounter (Signed)
Patient called and scheduled to see provider at 1 pm for VV. Patient seen. ?

## 2021-07-14 ENCOUNTER — Telehealth: Payer: Self-pay | Admitting: Gastroenterology

## 2021-07-14 DIAGNOSIS — R112 Nausea with vomiting, unspecified: Secondary | ICD-10-CM

## 2021-07-14 NOTE — Telephone Encounter (Signed)
Are you willing to refill Zofran? ?

## 2021-07-14 NOTE — Telephone Encounter (Signed)
Pl do ?Zofran 4 mg ODT every 8 hours as needed #30, no refills ?RG ?

## 2021-07-14 NOTE — Telephone Encounter (Signed)
Patient called requesting Zofran refill. Please advise.  ?

## 2021-07-15 MED ORDER — ONDANSETRON 4 MG PO TBDP: 4.0000 mg | ORAL_TABLET | Freq: Three times a day (TID) | ORAL | 0 refills | Status: DC | PRN

## 2021-07-15 NOTE — Telephone Encounter (Signed)
Sent to the pharmacy [place] ?

## 2021-07-17 ENCOUNTER — Ambulatory Visit: Payer: Medicare Other | Admitting: Family

## 2021-07-18 ENCOUNTER — Other Ambulatory Visit: Payer: Self-pay | Admitting: Family

## 2021-07-18 DIAGNOSIS — J309 Allergic rhinitis, unspecified: Secondary | ICD-10-CM

## 2021-07-24 ENCOUNTER — Encounter: Payer: Self-pay | Admitting: Family

## 2021-07-24 MED ORDER — ALBUTEROL SULFATE HFA 108 (90 BASE) MCG/ACT IN AERS: 2.0000 | INHALATION_SPRAY | Freq: Four times a day (QID) | RESPIRATORY_TRACT | 0 refills | Status: DC | PRN

## 2021-08-16 ENCOUNTER — Other Ambulatory Visit: Payer: Self-pay | Admitting: Family

## 2021-08-25 DIAGNOSIS — L219 Seborrheic dermatitis, unspecified: Secondary | ICD-10-CM | POA: Diagnosis not present

## 2021-09-14 ENCOUNTER — Other Ambulatory Visit: Payer: Self-pay | Admitting: Family

## 2021-09-15 DIAGNOSIS — L649 Androgenic alopecia, unspecified: Secondary | ICD-10-CM | POA: Diagnosis not present

## 2021-09-15 DIAGNOSIS — L219 Seborrheic dermatitis, unspecified: Secondary | ICD-10-CM | POA: Diagnosis not present

## 2021-09-21 ENCOUNTER — Other Ambulatory Visit: Payer: Self-pay | Admitting: Gastroenterology

## 2021-09-21 DIAGNOSIS — R112 Nausea with vomiting, unspecified: Secondary | ICD-10-CM

## 2021-09-22 ENCOUNTER — Other Ambulatory Visit: Payer: Self-pay | Admitting: Gastroenterology

## 2021-10-23 ENCOUNTER — Other Ambulatory Visit: Payer: Self-pay | Admitting: Family

## 2021-12-16 ENCOUNTER — Encounter: Payer: Self-pay | Admitting: Family Medicine

## 2021-12-16 ENCOUNTER — Ambulatory Visit (INDEPENDENT_AMBULATORY_CARE_PROVIDER_SITE_OTHER): Payer: Medicare Other | Admitting: Family Medicine

## 2021-12-16 VITALS — BP 140/80 | HR 84 | Temp 98.3°F | Resp 20 | Ht 61.0 in | Wt 170.0 lb

## 2021-12-16 DIAGNOSIS — J40 Bronchitis, not specified as acute or chronic: Secondary | ICD-10-CM

## 2021-12-16 DIAGNOSIS — J0100 Acute maxillary sinusitis, unspecified: Secondary | ICD-10-CM

## 2021-12-16 MED ORDER — BENZONATATE 100 MG PO CAPS: 100.0000 mg | ORAL_CAPSULE | Freq: Three times a day (TID) | ORAL | 0 refills | Status: DC | PRN

## 2021-12-16 MED ORDER — PREDNISONE 20 MG PO TABS: 40.0000 mg | ORAL_TABLET | Freq: Every day | ORAL | 0 refills | Status: AC

## 2021-12-16 MED ORDER — DOXYCYCLINE HYCLATE 100 MG PO TABS
100.0000 mg | ORAL_TABLET | Freq: Two times a day (BID) | ORAL | 0 refills | Status: DC
Start: 2021-12-18 — End: 2021-12-25

## 2021-12-16 NOTE — Progress Notes (Signed)
Chief Complaint  Patient presents with   Cough    Sxs started 1 1/2 week ago. Pt states having cough, vomiting at night with cough syrup, sore throat, headache, fatigue. COVID negative this morning. Pt has used OTC cough syrup and meds for headache    Bernece Gall here for URI complaints.  Duration: 10 days  Associated symptoms: sinus congestion, sinus pain, rhinorrhea, ear pain, sore throat, wheezing, shortness of breath, and coughing Denies: itchy watery eyes, ear drainage, myalgia, and fevers, nausea, loss of taste/smell Treatment to date: cough syrup, Dayquil, Nyquil, Delsym, robitussin  Sick contacts: No Tested neg for covid.   Past Medical History:  Diagnosis Date   AKI (acute kidney injury) ([place]) 01/09/2020   Allergy    seasonal   Anxiety    Arachnoiditis    Cataract    Depression    Elevated liver enzymes    Environmental allergies    GERD (gastroesophageal reflux disease)    H/O being hospitalized 03/2012   x1 week for nausea   History of chicken pox    Hyperlipidemia    Hypertension    Migraine with typical aura    resolved years ago   Nausea    chronic nausea    Neuromuscular disorder (HCC)     Objective BP (!) 140/80 (BP Location: Left Arm, Patient Position: Sitting, Cuff Size: Large)   Pulse 84   Temp 98.3 F (36.8 C) (Oral)   Resp 20   Ht '5\' 1"'$  (1.549 m)   Wt 170 lb (77.1 kg)   LMP 03/29/1993 Comment: 12/25/13 Pt states last cycle has been about 20 yrs ago.  SpO2 95%   BMI 32.12 kg/m  General: Awake, alert, appears stated age HEENT: AT, [place], ears patent b/l and TM's neg, nares patent w/o discharge, pharynx pink and without exudates, MMM, ttp over max sinuses b/l Neck: No masses or asymmetry Heart: RRR Lungs: CTAB, no accessory muscle use Psych: Age appropriate judgment and insight, normal mood and affect  Wheezy bronchitis - Plan: doxycycline (VIBRA-TABS) 100 MG tablet, predniSONE (DELTASONE) 20 MG tablet, benzonatate (TESSALON) 100 MG  capsule  Acute maxillary sinusitis, recurrence not specified - Plan: doxycycline (VIBRA-TABS) 100 MG tablet, predniSONE (DELTASONE) 20 MG tablet  5 d pred burst 40 mg/d. Doxy if no better in the next 2 days. Tessalon Perles prn.  Continue to push fluids, practice good hand hygiene, cover mouth when coughing. F/u prn. If starting to experience fevers, shaking, or shortness of breath, seek immediate care. Pt voiced understanding and agreement to the plan.  [place], DO 12/16/21 3:23 PM

## 2021-12-16 NOTE — Patient Instructions (Signed)
Continue to push fluids, practice good hand hygiene, and cover your mouth if you cough.  If you start having fevers, shaking or shortness of breath, seek immediate care.  Wait 2 days and then take the doxycycline if not improving.  OK to take Tylenol 1000 mg (2 extra strength tabs) or 975 mg (3 regular strength tabs) every 6 hours as needed.  Let us know if you need anything.

## 2021-12-21 ENCOUNTER — Telehealth: Payer: Self-pay | Admitting: Family Medicine

## 2021-12-21 MED ORDER — HYDROCODONE BIT-HOMATROP MBR 5-1.5 MG/5ML PO SOLN: 5.0000 mL | Freq: Three times a day (TID) | ORAL | 0 refills | Status: DC | PRN

## 2021-12-21 NOTE — Telephone Encounter (Signed)
Will send in another med. Update me in a couple days if still not turning corner. Ty.

## 2021-12-21 NOTE — Telephone Encounter (Signed)
Called informed the patient of PCP instructions. She verbalized understanding.

## 2021-12-21 NOTE — Telephone Encounter (Signed)
Pt called stating that she has had very little improvement with her cough and it is still keeping her up at night. Pt is currently taking prednisone and doxycycline to treat this issue. Please Advise.

## 2021-12-25 MED ORDER — AZITHROMYCIN 250 MG PO TABS: ORAL_TABLET | ORAL | 0 refills | Status: DC

## 2021-12-25 NOTE — Telephone Encounter (Signed)
Diff med sent. Ty.

## 2021-12-25 NOTE — Addendum Note (Signed)
Addended by: Ames Coupe on: 12/25/2021 03:22 PM   Modules accepted: Orders

## 2021-12-25 NOTE — Telephone Encounter (Signed)
Patient informed medication has been sent in

## 2021-12-25 NOTE — Telephone Encounter (Signed)
Patient states she is not feeling any better and thinks she has bronchitis. She would like to know if medication can be sent to her pharmacy. Please advise.

## 2021-12-30 ENCOUNTER — Ambulatory Visit (HOSPITAL_BASED_OUTPATIENT_CLINIC_OR_DEPARTMENT_OTHER)
Admission: RE | Admit: 2021-12-30 | Discharge: 2021-12-30 | Disposition: A | Discharge status: Home / Self Care | Payer: Medicare Other | Source: Ambulatory Visit | Attending: Family Medicine | Admitting: Family Medicine

## 2021-12-30 ENCOUNTER — Other Ambulatory Visit: Payer: Self-pay | Admitting: Family

## 2021-12-30 ENCOUNTER — Telehealth: Payer: Self-pay | Admitting: Family

## 2021-12-30 DIAGNOSIS — J40 Bronchitis, not specified as acute or chronic: Secondary | ICD-10-CM | POA: Insufficient documentation

## 2021-12-30 DIAGNOSIS — R059 Cough, unspecified: Secondary | ICD-10-CM | POA: Diagnosis not present

## 2021-12-30 NOTE — Telephone Encounter (Signed)
Put in the order. Called the patient to inform She is in bed and is not sure she can make it today This patient sounds really bad on the phone/I did ask her to check her temp/she has not checked it and will call us back with her temp.Coughing really bad She is taking a spoon full of honey and lemon every hour.

## 2021-12-30 NOTE — Telephone Encounter (Signed)
The patient called back and temp was 97.4. She is going to get her husband to take her to get the xray this afternoon.

## 2021-12-30 NOTE — Telephone Encounter (Signed)
Please order a dg chest 2 view. Ty.

## 2021-12-30 NOTE — Telephone Encounter (Signed)
Patient called to advise she still has all of the symptoms she had when she came to see Dr. Nani Ravens 3 weeks ago and has finished the medication. She said she stopped taking the hydrocodone cough syrup because it didn't help at all. Patient wants to know what is next. Please call to advise.

## 2021-12-31 ENCOUNTER — Telehealth: Payer: Self-pay | Admitting: Family

## 2021-12-31 NOTE — Telephone Encounter (Signed)
Patient called to follow up on results of xray yesterday. Advised that results are not back in yet but that someone from our office would contact her once we got them back. Patient understands.

## 2022-01-01 MED ORDER — DOXYCYCLINE HYCLATE 100 MG PO TABS: 100.0000 mg | ORAL_TABLET | Freq: Two times a day (BID) | ORAL | 0 refills | Status: AC

## 2022-01-01 NOTE — Telephone Encounter (Signed)
I looked at the X-ray and did not see anything obvious but we will await radiology read. I will send in a different medication in the meanwhile. Ty.

## 2022-01-01 NOTE — Telephone Encounter (Signed)
Patient called back to follow up on imaging results. Advised patient it does not look like the results have been sent in. Patient is eager to find out what is going on and what can be done to get her well again. Please follow up with imaging to see what the delay is and advise patient.

## 2022-01-01 NOTE — Addendum Note (Signed)
Addended by: Ames Coupe on: 01/01/2022 12:33 PM   Modules accepted: Orders

## 2022-01-01 NOTE — Telephone Encounter (Signed)
Called informed the patient of PCP instructions. She stated she is still no better, between chest hurting from all the coughing, has bad headache and throat pain. This has been going on now for around a month

## 2022-01-06 ENCOUNTER — Ambulatory Visit (INDEPENDENT_AMBULATORY_CARE_PROVIDER_SITE_OTHER): Payer: Medicare Other | Admitting: Family

## 2022-01-06 ENCOUNTER — Telehealth: Payer: Self-pay | Admitting: Family

## 2022-01-06 ENCOUNTER — Ambulatory Visit: Payer: Medicare Other | Admitting: Family

## 2022-01-06 VITALS — BP 128/76 | HR 70 | Temp 98.1°F | Resp 16 | Wt 170.0 lb

## 2022-01-06 DIAGNOSIS — J40 Bronchitis, not specified as acute or chronic: Secondary | ICD-10-CM | POA: Diagnosis not present

## 2022-01-06 DIAGNOSIS — I10 Essential (primary) hypertension: Secondary | ICD-10-CM

## 2022-01-06 DIAGNOSIS — F32A Depression, unspecified: Secondary | ICD-10-CM

## 2022-01-06 DIAGNOSIS — G039 Meningitis, unspecified: Secondary | ICD-10-CM

## 2022-01-06 DIAGNOSIS — E781 Pure hyperglyceridemia: Secondary | ICD-10-CM

## 2022-01-06 DIAGNOSIS — J309 Allergic rhinitis, unspecified: Secondary | ICD-10-CM | POA: Diagnosis not present

## 2022-01-06 DIAGNOSIS — F419 Anxiety disorder, unspecified: Secondary | ICD-10-CM | POA: Diagnosis not present

## 2022-01-06 DIAGNOSIS — R110 Nausea: Secondary | ICD-10-CM

## 2022-01-06 DIAGNOSIS — D696 Thrombocytopenia, unspecified: Secondary | ICD-10-CM

## 2022-01-06 DIAGNOSIS — K219 Gastro-esophageal reflux disease without esophagitis: Secondary | ICD-10-CM | POA: Diagnosis not present

## 2022-01-06 LAB — BASIC METABOLIC PANEL
BUN: 19 mg/dL (ref 6–23)
CO2: 25 mEq/L (ref 19–32)
Calcium: 9.1 mg/dL (ref 8.4–10.5)
Chloride: 106 mEq/L (ref 96–112)
Creatinine, Ser: 0.98 mg/dL (ref 0.40–1.20)
GFR: 58.27 mL/min — ABNORMAL LOW (ref >60.00)
Glucose, Bld: 86 mg/dL (ref 70–99)
Potassium: 4 mEq/L (ref 3.5–5.1)
Sodium: 139 mEq/L (ref 135–145)

## 2022-01-06 LAB — LIPID PANEL
Cholesterol: 214 mg/dL — ABNORMAL HIGH (ref 0–200)
HDL: 53.9 mg/dL (ref >39.00)
LDL Cholesterol: 128 mg/dL — ABNORMAL HIGH (ref 0–99)
NonHDL: 159.68
Total CHOL/HDL Ratio: 4
Triglycerides: 159 mg/dL — ABNORMAL HIGH (ref 0.0–149.0)
VLDL: 31.8 mg/dL (ref 0.0–40.0)

## 2022-01-06 MED ORDER — LORATADINE 10 MG PO TABS: 10.0000 mg | ORAL_TABLET | Freq: Every day | ORAL | 11 refills | Status: DC

## 2022-01-06 MED ORDER — GABAPENTIN 100 MG PO CAPS: 200.0000 mg | ORAL_CAPSULE | Freq: Three times a day (TID) | ORAL | 1 refills | Status: DC

## 2022-01-06 MED ORDER — ALBUTEROL SULFATE HFA 108 (90 BASE) MCG/ACT IN AERS: 2.0000 | INHALATION_SPRAY | Freq: Four times a day (QID) | RESPIRATORY_TRACT | 0 refills | Status: DC | PRN

## 2022-01-06 MED ORDER — FLUTICASONE PROPIONATE 50 MCG/ACT NA SUSP: 2.0000 | Freq: Every day | NASAL | 6 refills | Status: DC

## 2022-01-06 NOTE — Assessment & Plan Note (Signed)
Lab Results  Component Value Date   CHOL 200 12/30/2020   HDL 50.60 12/30/2020   LDLCALC 116 (H) 06/13/2015   LDLDIRECT 110.0 12/30/2020   TRIG 228.0 (H) 12/30/2020   CHOLHDL 4 12/30/2020   Not on statin.  Continue dietary efforts.

## 2022-01-06 NOTE — Progress Notes (Signed)
Subjective:   By signing my name below, I, [name], attest that this documentation has been prepared under the direction and in the presence of [name], NP 01/06/2022    Patient ID: [name], female    DOB: 06-May-1950, 71 y.o.   MRN: [id number]  Chief Complaint  Patient presents with   Hypertension    Here for follow up   Cough    Patient has had a cough for 4 months.     HPI Patient is in today for a follow up visit.   Cough- She complains of a cough for the past 4 months. He symptoms worsened within the past month. Her cough came on 4 months ago when she was also experiencing sore throat and headaches. She continues having have cough and headaches since then. Her throat occasionally gets dry and scratchy. She denies having any phlegm with her cough. She is managing her headaches with tylenol. She has taken doxycyline and hydrocodone cough syrup to manage her symptoms and found no change in her symptoms. She requesting another anti-biotics and was given another dose of doxycyline. She had an x-ray completed and found she had bronchitis. She is willing to try Singulair and Flonase to help manage her symptoms. She is also interested in seeing a pulmonary specialist for further evaluation of her symptoms. She has tried albuterol in the past but does not have any with her at this time.  Mood- She continues following up with her psychiatrist regularly. Her mood has been down recently due to feeling sick. She is no longer taking adderall.    Gabapentin- She continues taking Gabapentin to manage her arachnoiditis. She reports her symptoms are worsening and spreading down her right leg.   Nausea- Her nausea is worse when she takes anti-biotics without eating prior to taking it. She continues taking Zofran when her symptoms worsen.   Heartburn- She continues taking omeprazole to manage her heartburn.   Immunizations- She is UTD on flu vaccine, RSV vaccine, and has received  the new Covid-19 booster vaccine.    Health Maintenance Due  Topic Date Due   TETANUS/TDAP  06/07/2018   Pneumonia Vaccine 41+ Years old (8 - PPSV23 or PCV20) 12/13/2019   COVID-19 Vaccine (5 - Pfizer series) 05/20/2021   INFLUENZA VACCINE  10/27/2021    Past Medical History:  Diagnosis Date   AKI (acute kidney injury) ([place]) 01/09/2020   Allergy    seasonal   Anxiety    Arachnoiditis    Cataract    Depression    Elevated liver enzymes    Environmental allergies    GERD (gastroesophageal reflux disease)    H/O being hospitalized 03/2012   x1 week for nausea   History of chicken pox    Hyperlipidemia    Hypertension    Migraine with typical aura    resolved years ago   Nausea    chronic nausea    Neuromuscular disorder ([place])     Past Surgical History:  Procedure Laterality Date   ABDOMINOPLASTY  07/11/09   EUS N/A 05/05/2012   Procedure: UPPER ENDOSCOPIC ULTRASOUND (EUS) LINEAR;  Surgeon: Beryle Beams, MD;  Location: [facility];  Service: Endoscopy;  Laterality: N/A;   LAPAROSCOPIC CHOLECYSTECTOMY  2010   NASAL SINUS SURGERY     "Has had 2-3 surgeries over the years"   UMBILICAL HERNIA REPAIR N/A 10/06/2020   Procedure: UMBILICAL HERNIA REPAIR;  Surgeon: Coralie Keens, MD;  Location: [facility];  Service:  General;  Laterality: N/A;  LMA ANESTHESIA    Family History  Problem Relation Age of Onset   Alzheimer's disease Mother    Hypertension Mother    Migraines Mother    Hypertension Father    Other Father        ?carotid artery aneurysm   Cancer Brother        mouth and throat   Colon cancer Neg Hx     Social History   Socioeconomic History   Marital status: Married    Spouse name: Not on file   Number of children: Not on file   Years of education: Not on file   Highest education level: Not on file  Occupational History   Not on file  Tobacco Use   Smoking status: Former    Packs/day: 0.25    Years: 8.00    Total pack years:  2.00    Types: Cigarettes    Quit date: 08/09/1986    Years since quitting: 35.4   Smokeless tobacco: Never  Vaping Use   Vaping Use: Never used  Substance and Sexual Activity   Alcohol use: No   Drug use: No   Sexual activity: Not Currently    Partners: Male    Birth control/protection: Other-see comments, Post-menopausal    Comment: husband had vasectomy.   Other Topics Concern   Not on file  Social History Narrative   2 step daughters- 48 and 41   She worked as an Web designer.   Enjoys puzzles, counted cross stitch.   Completed 1 year of college   4 cats   Social Determinants of Health   Financial Resource Strain: Low Risk  (04/06/2021)   Overall Financial Resource Strain (CARDIA)    Difficulty of Paying Living Expenses: Not hard at all  Food Insecurity: No Food Insecurity (04/06/2021)   Hunger Vital Sign    Worried About Running Out of Food in the Last Year: Never true    Ran Out of Food in the Last Year: Never true  Transportation Needs: No Transportation Needs (04/06/2021)   PRAPARE - Hydrologist (Medical): No    Lack of Transportation (Non-Medical): No  Physical Activity: Inactive (04/06/2021)   Exercise Vital Sign    Days of Exercise per Week: 0 days    Minutes of Exercise per Session: 0 min  Stress: No Stress Concern Present (04/06/2021)   [place]    Feeling of Stress : Only a little  Social Connections: Moderately Isolated (04/06/2021)   Social Connection and Isolation Panel [NHANES]    Frequency of Communication with Friends and Family: More than three times a week    Frequency of Social Gatherings with Friends and Family: Never    Attends Religious Services: Never    Marine scientist or Organizations: No    Attends Archivist Meetings: Never    Marital Status: Married  Human resources officer Violence: Not At Risk (04/06/2021)   Humiliation, Afraid,  Rape, and Kick questionnaire    Fear of Current or Ex-Partner: No    Emotionally Abused: No    Physically Abused: No    Sexually Abused: No    Outpatient Medications Prior to Visit  Medication Sig Dispense Refill   amLODipine (NORVASC) 5 MG tablet TAKE 1 TABLET (5 MG TOTAL) BY MOUTH DAILY. 90 tablet 1   clonazePAM (KLONOPIN) 1 MG tablet TAKE 1 TABLET BY MOUTH THREE TIMES A  DAY AS NEEDED FOR ANXIETY 90 tablet 0   docusate sodium (COLACE) 100 MG capsule Take 1 capsule (100 mg total) by mouth 2 (two) times daily. 180 capsule 3   doxycycline (VIBRA-TABS) 100 MG tablet Take 1 tablet (100 mg total) by mouth 2 (two) times daily for 7 days. 14 tablet 0   escitalopram (LEXAPRO) 10 MG tablet Take 1 tablet (10 mg total) by mouth daily.     HYDROcodone bit-homatropine (HYCODAN) 5-1.5 MG/5ML syrup Take 5 mLs by mouth every 8 (eight) hours as needed for cough. 120 mL 0   linaclotide (LINZESS) 72 MCG capsule Twice a week 30 capsule 5   mirtazapine (REMERON) 45 MG tablet Take 45 mg by mouth at bedtime.      montelukast (SINGULAIR) 10 MG tablet TAKE 1 TABLET BY MOUTH EVERYDAY AT BEDTIME 90 tablet 1   Multiple Vitamin (MULTIVITAMIN) capsule Take 1 capsule by mouth daily. Dynamic brain     omeprazole (PRILOSEC) 40 MG capsule TAKE 1 CAPSULE (40 MG TOTAL) BY MOUTH IN THE MORNING AND AT BEDTIME. 180 capsule 1   ondansetron (ZOFRAN-ODT) 4 MG disintegrating tablet TAKE 1 TABLET BY MOUTH EVERY 8 HOURS AS NEEDED FOR NAUSEA AND VOMITING 30 tablet 0   traZODone (DESYREL) 50 MG tablet Take 50-150 mg by mouth at bedtime as needed for sleep.      amphetamine-dextroamphetamine (ADDERALL) 20 MG tablet Take 20 mg by mouth daily.     gabapentin (NEURONTIN) 100 MG capsule TAKE 1 CAPSULE (100 MG TOTAL) BY MOUTH THREE TIMES DAILY. 90 capsule 5   No facility-administered medications prior to visit.    Allergies  Allergen Reactions   Compazine [Prochlorperazine]     hyperactivity   Morphine And Related Hives and Itching    Trintellix [Vortioxetine] Nausea And Vomiting    Review of Systems  HENT:         (+)dry and scratchy throat  Respiratory:  Positive for cough. Negative for sputum production.   Neurological:  Positive for headaches.       Objective:    Physical Exam Constitutional:      General: She is not in acute distress.    Appearance: Normal appearance. She is not ill-appearing.  HENT:     Head: Normocephalic and atraumatic.     Right Ear: External ear normal.     Left Ear: External ear normal.  Eyes:     Extraocular Movements: Extraocular movements intact.     Pupils: Pupils are equal, round, and reactive to light.  Cardiovascular:     Rate and Rhythm: Normal rate and regular rhythm.     Heart sounds: Normal heart sounds. No murmur heard.    No gallop.  Pulmonary:     Effort: Pulmonary effort is normal. No respiratory distress.     Breath sounds: Normal breath sounds. No wheezing or rales.  Skin:    General: Skin is warm and dry.  Neurological:     Mental Status: She is alert and oriented to person, place, and time.  Psychiatric:        Judgment: Judgment normal.     BP 128/76 (BP Location: Right Arm, Patient Position: Sitting, Cuff Size: Small)   Pulse 70   Temp 98.1 F (36.7 C) (Oral)   Resp 16   Wt 170 lb (77.1 kg)   LMP 03/29/1993 Comment: 12/25/13 Pt states last cycle has been about 20 yrs ago.  SpO2 96%   BMI 32.12 kg/m  Wt Readings from Last 3  Encounters:  01/06/22 170 lb (77.1 kg)  12/16/21 170 lb (77.1 kg)  07/07/21 169 lb (76.7 kg)       Assessment & Plan:   Problem List Items Addressed This Visit       Unprioritized   Hypertriglyceridemia    Lab Results  Component Value Date   CHOL 200 12/30/2020   HDL 50.60 12/30/2020   LDLCALC 116 (H) 06/13/2015   LDLDIRECT 110.0 12/30/2020   TRIG 228.0 (H) 12/30/2020   CHOLHDL 4 12/30/2020  Not on statin.  Continue dietary efforts.        Relevant Orders   Lipid panel   Hypertension    BP Readings from  Last 3 Encounters:  01/06/22 128/76  12/16/21 (!) 140/80  07/10/21 (!) 96/43  Stable, continue amlodipine '5mg'$  once daily.       Relevant Orders   Basic metabolic panel   GERD (gastroesophageal reflux disease)    Stable on omeprazole. Continue same.      Chronic nausea    Worsened by antibiotic. Has zofran for prn use.       Bronchitis - Primary    Chronic. Recommended that she add claritin and flonase, albuterol prn and see pulmonology. She will complete doxycycline.       Relevant Orders   Ambulatory referral to Pulmonology   Arachnoiditis    Uncontrolled pain, numbness in her right leg.  Will increase gabapentin from '100mg'$  to '200mg'$  TID.       Anxiety and depression    Reports mood is stable except for feeling down from being sick for so long.  Management per psychiatry.       Allergic rhinitis    Uncontrolled on singuliar. Add flonase and claritin.         Meds ordered this encounter  Medications   albuterol (VENTOLIN HFA) 108 (90 Base) MCG/ACT inhaler    Sig: Inhale 2 puffs into the lungs every 6 (six) hours as needed for wheezing or shortness of breath.    Dispense:  8 g    Refill:  0    Order Specific Question:   Supervising Provider    Answer:   Penni Homans A [[id number]]   loratadine (CLARITIN) 10 MG tablet    Sig: Take 1 tablet (10 mg total) by mouth daily.    Dispense:  30 tablet    Refill:  11    Order Specific Question:   Supervising Provider    Answer:   Penni Homans A [[id number]]   fluticasone (FLONASE) 50 MCG/ACT nasal spray    Sig: Place 2 sprays into both nostrils daily.    Dispense:  16 g    Refill:  6    Order Specific Question:   Supervising Provider    Answer:   Penni Homans A [[id number]]   gabapentin (NEURONTIN) 100 MG capsule    Sig: Take 2 capsules (200 mg total) by mouth 3 (three) times daily.    Dispense:  180 capsule    Refill:  1    Order Specific Question:   Supervising Provider    Answer:   Penni Homans A [[id number]]    I, Nance Pear, NP, personally preformed the services described in this documentation.  All medical record entries made by the scribe were at my direction and in my presence.  I have reviewed the chart and discharge instructions (if applicable) and agree that the record reflects my personal performance and is accurate and complete. 01/06/2022   I,Ashley  [name],acting as a Education administrator for [employer], NP.,have documented all relevant documentation on the behalf of Nance Pear, NP,as directed by  Nance Pear, NP while in the presence of Nance Pear, NP.   Nance Pear, NP

## 2022-01-06 NOTE — Telephone Encounter (Signed)
Please request immunization records from [facility] on [address].

## 2022-01-06 NOTE — Assessment & Plan Note (Signed)
Reports mood is stable except for feeling down from being sick for so long.  Management per psychiatry.

## 2022-01-06 NOTE — Assessment & Plan Note (Signed)
BP Readings from Last 3 Encounters:  01/06/22 128/76  12/16/21 (!) 140/80  07/10/21 (!) 96/43   Stable, continue amlodipine '5mg'$  once daily.

## 2022-01-06 NOTE — Assessment & Plan Note (Addendum)
Chronic. Recommended that she add claritin and flonase, albuterol prn and see pulmonology. She will complete doxycycline.

## 2022-01-06 NOTE — Assessment & Plan Note (Signed)
Uncontrolled on singuliar. Add flonase and claritin.

## 2022-01-06 NOTE — Assessment & Plan Note (Signed)
Stable on omeprazole. Continue same.  ?

## 2022-01-06 NOTE — Assessment & Plan Note (Addendum)
Worsened by antibiotic. Has zofran for prn use.

## 2022-01-06 NOTE — Assessment & Plan Note (Signed)
Uncontrolled pain, numbness in her right leg.  Will increase gabapentin from '100mg'$  to '200mg'$  TID.

## 2022-01-15 DIAGNOSIS — L820 Inflamed seborrheic keratosis: Secondary | ICD-10-CM | POA: Diagnosis not present

## 2022-01-15 DIAGNOSIS — L649 Androgenic alopecia, unspecified: Secondary | ICD-10-CM | POA: Diagnosis not present

## 2022-01-15 DIAGNOSIS — L739 Follicular disorder, unspecified: Secondary | ICD-10-CM | POA: Diagnosis not present

## 2022-01-15 DIAGNOSIS — L219 Seborrheic dermatitis, unspecified: Secondary | ICD-10-CM | POA: Diagnosis not present

## 2022-01-15 DIAGNOSIS — D485 Neoplasm of uncertain behavior of skin: Secondary | ICD-10-CM | POA: Diagnosis not present

## 2022-01-15 DIAGNOSIS — L859 Epidermal thickening, unspecified: Secondary | ICD-10-CM | POA: Diagnosis not present

## 2022-01-28 ENCOUNTER — Other Ambulatory Visit: Payer: Self-pay | Admitting: Family

## 2022-01-31 ENCOUNTER — Encounter: Payer: Self-pay | Admitting: Family

## 2022-02-01 ENCOUNTER — Other Ambulatory Visit: Payer: Self-pay | Admitting: Family

## 2022-02-01 ENCOUNTER — Other Ambulatory Visit: Payer: Self-pay | Admitting: *Deleted

## 2022-02-01 DIAGNOSIS — J309 Allergic rhinitis, unspecified: Secondary | ICD-10-CM

## 2022-02-01 MED ORDER — ALBUTEROL SULFATE (2.5 MG/3ML) 0.083% IN NEBU: 2.5000 mg | INHALATION_SOLUTION | Freq: Four times a day (QID) | RESPIRATORY_TRACT | 1 refills | Status: DC | PRN

## 2022-02-01 NOTE — Telephone Encounter (Signed)
Rx faxed to [facility] per patient's request

## 2022-02-01 NOTE — Telephone Encounter (Signed)
Patient called stating she would like a nebulizer and the albuterol liquid sent, not the inhaler. Please advise.

## 2022-02-01 NOTE — Telephone Encounter (Signed)
Patient advised her pharmacy does not carry medical supplies (per pharmacist) but [facility] in  [facility], I also told her she can pick up and take to a local medical supply store. She will call me back and let me know if I need to fax somewhere or if she will pick up.

## 2022-02-01 NOTE — Telephone Encounter (Signed)
I sent rx for albuterol nebs to her pharmacy. I hand wrote the rx for nebulizer/tubing. Do you mind checking with her pharmacy if they can fill this and faxing? If not, she may need to bring to a medical supply or pay cash.   I have not seen her in 3 weeks. Since she is not improving, I would recommend a follow up appointment please.

## 2022-02-08 ENCOUNTER — Encounter: Payer: Self-pay | Admitting: Family

## 2022-02-08 DIAGNOSIS — R052 Subacute cough: Secondary | ICD-10-CM

## 2022-02-08 NOTE — Telephone Encounter (Signed)
Please advise. She would like to know if she needs to be hospitalized.

## 2022-02-09 NOTE — Addendum Note (Signed)
Addended by: [name] on: 02/09/2022 09:20 AM   Modules accepted: Orders

## 2022-02-11 ENCOUNTER — Ambulatory Visit (HOSPITAL_BASED_OUTPATIENT_CLINIC_OR_DEPARTMENT_OTHER)
Admission: RE | Admit: 2022-02-11 | Discharge: 2022-02-11 | Disposition: A | Discharge status: Home / Self Care | Payer: Medicare Other | Source: Ambulatory Visit | Attending: Family | Admitting: Family

## 2022-02-11 DIAGNOSIS — R052 Subacute cough: Secondary | ICD-10-CM | POA: Diagnosis not present

## 2022-02-11 DIAGNOSIS — I3139 Other pericardial effusion (noninflammatory): Secondary | ICD-10-CM | POA: Diagnosis not present

## 2022-02-11 DIAGNOSIS — I700 Atherosclerosis of aorta: Secondary | ICD-10-CM | POA: Diagnosis not present

## 2022-02-11 DIAGNOSIS — J90 Pleural effusion, not elsewhere classified: Secondary | ICD-10-CM | POA: Diagnosis not present

## 2022-02-12 NOTE — Telephone Encounter (Signed)
Pt was calling to see if pcp has looked at imaging yet, and if she could call her back later today.

## 2022-02-14 ENCOUNTER — Telehealth: Payer: Self-pay | Admitting: Family

## 2022-02-14 MED ORDER — AZITHROMYCIN 250 MG PO TABS: ORAL_TABLET | ORAL | 0 refills | Status: AC

## 2022-02-14 MED ORDER — AMOXICILLIN 500 MG PO CAPS: 500.0000 mg | ORAL_CAPSULE | Freq: Three times a day (TID) | ORAL | 0 refills | Status: AC

## 2022-02-14 NOTE — Telephone Encounter (Signed)
Reviewed CT scan- notes ? Pneumonia (? Mass like in [place]).  Will refer to pulmonology and also begin abx.

## 2022-02-15 ENCOUNTER — Telehealth: Payer: Self-pay

## 2022-02-15 NOTE — Telephone Encounter (Signed)
Noted  

## 2022-02-15 NOTE — Telephone Encounter (Signed)
Dr. [name]- Please see below.  I am treating her with antibiotics, but could your office please reach out to her as well to arrange pulmonary follow up? tks

## 2022-02-15 NOTE — Telephone Encounter (Signed)
IMPRESSION: 1. Small right-sided pleural effusion. Irregular masslike consolidation in the right lower lobe with surrounding ground-glass density and multiple small foci of irregular consolidation. Given rapid progression since 12/30/2021, findings probably represent pneumonia, however given masslike configuration of the dominant area of consolidation, imaging follow-up to resolution is advised. 2. Small pericardial effusion. 3. Partially visualized pneumobilia, correlate for history of prior sphincterotomy. 4. Aortic atherosclerosis.   Call report from [facility] reporting above.

## 2022-02-16 NOTE — Telephone Encounter (Signed)
Opened in error

## 2022-02-17 NOTE — Telephone Encounter (Signed)
Ok. Thanks for the update. Looks like she is already scheduled to seen me on 12/6

## 2022-03-02 ENCOUNTER — Ambulatory Visit (INDEPENDENT_AMBULATORY_CARE_PROVIDER_SITE_OTHER): Payer: Medicare Other | Admitting: Family

## 2022-03-02 ENCOUNTER — Encounter: Payer: Self-pay | Admitting: Family

## 2022-03-02 VITALS — BP 138/69 | HR 63 | Temp 98.6°F | Resp 16 | Wt 167.0 lb

## 2022-03-02 DIAGNOSIS — Z23 Encounter for immunization: Secondary | ICD-10-CM | POA: Diagnosis not present

## 2022-03-02 DIAGNOSIS — I10 Essential (primary) hypertension: Secondary | ICD-10-CM

## 2022-03-02 DIAGNOSIS — F32A Depression, unspecified: Secondary | ICD-10-CM | POA: Diagnosis not present

## 2022-03-02 DIAGNOSIS — J189 Pneumonia, unspecified organism: Secondary | ICD-10-CM | POA: Insufficient documentation

## 2022-03-02 DIAGNOSIS — F419 Anxiety disorder, unspecified: Secondary | ICD-10-CM | POA: Diagnosis not present

## 2022-03-02 DIAGNOSIS — J019 Acute sinusitis, unspecified: Secondary | ICD-10-CM | POA: Diagnosis not present

## 2022-03-02 HISTORY — DX: Pneumonia, unspecified organism: J18.9

## 2022-03-02 NOTE — Progress Notes (Signed)
Subjective:   By signing my name below, I, [name], attest that this documentation has been prepared under the direction and in the presence of Karie Chimera, NP 03/02/2022   Patient ID: [name], female    DOB: February 19, 1951, 71 y.o.   MRN: [id number]  Chief Complaint  Patient presents with   Hypertension    Here for follow up   Pneumonia    "Dealing with pneumonia since September"    HPI Patient is in today for an office visit  Subacute Cough: She had a CT scan on 02/11/2022 for her cough and was found that she had fluid in the base of her right lung. She is scheduled to be seen by Dr. [name] on 03/02/2022. She reports that since last visit, her cough has significantly improved. Her back no longer hurts when she coughs. However, she has some pressure headaches, sore/scratchy throat, tinnitus, and diarrhea. She has completed her Doxycyline and Z-Pak on 03/02/2022. Her mucus is green/yellow in color. She also struggles to fully blow her mucus. She is taking cough drops and consuming honey and lemon to help soothe her throat. She was seen by Dr.Wendling on 12/16/2021 and reports that she had similar symptoms during that time. She is not taking any antihistamines at the time.   Blood Pressure: As of today's visit, her blood pressure is normal. She is currently taking 5 mg of Amlodipine.  BP Readings from Last 3 Encounters:  03/02/22 138/69  01/06/22 128/76  12/16/21 (!) 140/80   Pulse Readings from Last 3 Encounters:  03/02/22 63  01/06/22 70  12/16/21 84   Mood: She notes that overall her mood is normal however, she is working with her daughter which is causing her stress. She is currently taking 10 mg of Lexapro. She is following up with her psychiatrist, Dr.Carr, regularly.   Immunizations: She is due for an pneumonia and is interested in receiving an pneumonia booster during today's visit. She is UTD on her RSV, Influenza and Covid vaccines.   Health Maintenance  Due  Topic Date Due   DTaP/Tdap/Td (2 - Tdap) 06/07/2018   COVID-19 Vaccine (6 - 2023-24 season) 02/13/2022   Medicare Annual Wellness (AWV)  04/06/2022    Past Medical History:  Diagnosis Date   AKI (acute kidney injury) ([place]) 01/09/2020   Allergy    seasonal   Anxiety    Arachnoiditis    Cataract    Depression    Elevated liver enzymes    Environmental allergies    GERD (gastroesophageal reflux disease)    H/O being hospitalized 03/2012   x1 week for nausea   History of chicken pox    Hyperlipidemia    Hypertension    Migraine with typical aura    resolved years ago   Nausea    chronic nausea    Neuromuscular disorder [facility])     Past Surgical History:  Procedure Laterality Date   ABDOMINOPLASTY  07/11/09   EUS N/A 05/05/2012   Procedure: UPPER ENDOSCOPIC ULTRASOUND (EUS) LINEAR;  Surgeon: Beryle Beams, MD;  Location: [facility];  Service: Endoscopy;  Laterality: N/A;   LAPAROSCOPIC CHOLECYSTECTOMY  2010   NASAL SINUS SURGERY     "Has had 2-3 surgeries over the years"   UMBILICAL HERNIA REPAIR N/A 10/06/2020   Procedure: UMBILICAL HERNIA REPAIR;  Surgeon: Coralie Keens, MD;  Location: [facility];  Service: General;  Laterality: N/A;  LMA ANESTHESIA    Family History  Problem Relation  Age of Onset   Alzheimer's disease Mother    Hypertension Mother    Migraines Mother    Hypertension Father    Other Father        ?carotid artery aneurysm   Cancer Brother        mouth and throat   Colon cancer Neg Hx     Social History   Socioeconomic History   Marital status: Married    Spouse name: Not on file   Number of children: Not on file   Years of education: Not on file   Highest education level: Not on file  Occupational History   Not on file  Tobacco Use   Smoking status: Former    Packs/day: 0.25    Years: 8.00    Total pack years: 2.00    Types: Cigarettes    Quit date: 08/09/1986    Years since quitting: 35.5   Smokeless tobacco:  Never  Vaping Use   Vaping Use: Never used  Substance and Sexual Activity   Alcohol use: No   Drug use: No   Sexual activity: Not Currently    Partners: Male    Birth control/protection: Other-see comments, Post-menopausal    Comment: husband had vasectomy.   Other Topics Concern   Not on file  Social History Narrative   2 step daughters- 28 and 2   She worked as an Web designer.   Enjoys puzzles, counted cross stitch.   Completed 1 year of college   4 cats   Social Determinants of Health   Financial Resource Strain: Low Risk  (04/06/2021)   Overall Financial Resource Strain (CARDIA)    Difficulty of Paying Living Expenses: Not hard at all  Food Insecurity: No Food Insecurity (04/06/2021)   Hunger Vital Sign    Worried About Running Out of Food in the Last Year: Never true    Ran Out of Food in the Last Year: Never true  Transportation Needs: No Transportation Needs (04/06/2021)   PRAPARE - Hydrologist (Medical): No    Lack of Transportation (Non-Medical): No  Physical Activity: Inactive (04/06/2021)   Exercise Vital Sign    Days of Exercise per Week: 0 days    Minutes of Exercise per Session: 0 min  Stress: No Stress Concern Present (04/06/2021)   [place]    Feeling of Stress : Only a little  Social Connections: Moderately Isolated (04/06/2021)   Social Connection and Isolation Panel [NHANES]    Frequency of Communication with Friends and Family: More than three times a week    Frequency of Social Gatherings with Friends and Family: Never    Attends Religious Services: Never    Marine scientist or Organizations: No    Attends Archivist Meetings: Never    Marital Status: Married  Human resources officer Violence: Not At Risk (04/06/2021)   Humiliation, Afraid, Rape, and Kick questionnaire    Fear of Current or Ex-Partner: No    Emotionally Abused: No     Physically Abused: No    Sexually Abused: No    Outpatient Medications Prior to Visit  Medication Sig Dispense Refill   albuterol (PROVENTIL) (2.5 MG/3ML) 0.083% nebulizer solution Take 3 mLs (2.5 mg total) by nebulization every 6 (six) hours as needed for wheezing or shortness of breath. 150 mL 1   albuterol (VENTOLIN HFA) 108 (90 Base) MCG/ACT inhaler TAKE 2 PUFFS BY MOUTH EVERY 6  HOURS AS NEEDED FOR WHEEZE OR SHORTNESS OF BREATH 8.5 each 0   amLODipine (NORVASC) 5 MG tablet TAKE 1 TABLET (5 MG TOTAL) BY MOUTH DAILY. 90 tablet 1   clonazePAM (KLONOPIN) 1 MG tablet TAKE 1 TABLET BY MOUTH THREE TIMES A DAY AS NEEDED FOR ANXIETY 90 tablet 0   docusate sodium (COLACE) 100 MG capsule Take 1 capsule (100 mg total) by mouth 2 (two) times daily. 180 capsule 3   escitalopram (LEXAPRO) 10 MG tablet Take 1 tablet (10 mg total) by mouth daily.     fluticasone (FLONASE) 50 MCG/ACT nasal spray Place 2 sprays into both nostrils daily. 16 g 6   gabapentin (NEURONTIN) 100 MG capsule Take 2 capsules (200 mg total) by mouth 3 (three) times daily. 180 capsule 1   HYDROcodone bit-homatropine (HYCODAN) 5-1.5 MG/5ML syrup Take 5 mLs by mouth every 8 (eight) hours as needed for cough. 120 mL 0   linaclotide (LINZESS) 72 MCG capsule Twice a week 30 capsule 5   loratadine (CLARITIN) 10 MG tablet Take 1 tablet (10 mg total) by mouth daily. 30 tablet 11   mirtazapine (REMERON) 45 MG tablet Take 45 mg by mouth at bedtime.      montelukast (SINGULAIR) 10 MG tablet TAKE 1 TABLET BY MOUTH EVERYDAY AT BEDTIME 90 tablet 1   Multiple Vitamin (MULTIVITAMIN) capsule Take 1 capsule by mouth daily. Dynamic brain     omeprazole (PRILOSEC) 40 MG capsule TAKE 1 CAPSULE (40 MG TOTAL) BY MOUTH IN THE MORNING AND AT BEDTIME. 180 capsule 1   ondansetron (ZOFRAN-ODT) 4 MG disintegrating tablet TAKE 1 TABLET BY MOUTH EVERY 8 HOURS AS NEEDED FOR NAUSEA AND VOMITING 30 tablet 0   traZODone (DESYREL) 50 MG tablet Take 50-150 mg by mouth at  bedtime as needed for sleep.      No facility-administered medications prior to visit.    Allergies  Allergen Reactions   Compazine [Prochlorperazine]     hyperactivity   Morphine And Related Hives and Itching   Trintellix [Vortioxetine] Nausea And Vomiting    Review of Systems  HENT:  Positive for sore throat and tinnitus.   Gastrointestinal:  Positive for diarrhea.  Neurological:  Positive for headaches.       Objective:    Physical Exam Constitutional:      General: She is not in acute distress.    Appearance: Normal appearance. She is not ill-appearing.  HENT:     Head: Normocephalic and atraumatic.     Right Ear: External ear normal.     Left Ear: External ear normal.  Eyes:     Extraocular Movements: Extraocular movements intact.     Pupils: Pupils are equal, round, and reactive to light.  Cardiovascular:     Rate and Rhythm: Normal rate and regular rhythm.     Heart sounds: Normal heart sounds. No murmur heard.    No gallop.  Pulmonary:     Effort: Pulmonary effort is normal. No respiratory distress.     Breath sounds: Examination of the right-lower field reveals decreased breath sounds. Decreased breath sounds (And Cough) present. No wheezing or rales.  Skin:    General: Skin is warm and dry.  Neurological:     Mental Status: She is alert and oriented to person, place, and time.  Psychiatric:        Mood and Affect: Mood normal.        Behavior: Behavior normal.        Judgment: Judgment normal.  BP 138/69 (BP Location: Right Arm, Patient Position: Sitting, Cuff Size: Small)   Pulse 63   Temp 98.6 F (37 C) (Oral)   Resp 16   Wt 167 lb (75.8 kg)   LMP 03/29/1993 Comment: 12/25/13 Pt states last cycle has been about 20 yrs ago.  SpO2 94%   BMI 31.55 kg/m  Wt Readings from Last 3 Encounters:  03/02/22 167 lb (75.8 kg)  01/06/22 170 lb (77.1 kg)  12/16/21 170 lb (77.1 kg)       Assessment & Plan:   Problem List Items Addressed This Visit        Unprioritized   Subacute sinusitis - Primary    She has been treated initially with doxycycline, then zpak/amoxicillin.  I would like to get a CT of her sinus before considering additional antibiotics.  In the meantime, recommended that she add claritin, mucinex, continue nasal saline spray.       Relevant Orders   CT MAXILLOFACIAL WO CONTRAST   Pneumonia    Clinically improved following rx with amoxicillin/zpak.  There were concerns on the CT chest about possible RLL Mass.  She is scheduled to see Dr. [name] (pulmonology) tomorrow. Will defer further imaging/work up to pulmonology.       Hypertension    BP stable on amlodipine. Continue same.       Anxiety and depression    Currently maintained on lexapro '10mg'$ .  Overall mood has been stable. Having some family stressors. She is followed by Dr. Toy Care.       Other Visit Diagnoses     Need for 23-polyvalent pneumococcal polysaccharide vaccine       Relevant Orders   Pneumococcal polysaccharide vaccine 23-valent greater than or equal to 2yo subcutaneous/IM (Completed)      No orders of the defined types were placed in this encounter.   I, Nance Pear, NP, personally preformed the services described in this documentation.  All medical record entries made by the scribe were at my direction and in my presence.  I have reviewed the chart and discharge instructions (if applicable) and agree that the record reflects my personal performance and is accurate and complete. 03/02/2022   I,Amber Collins,acting as a scribe for Nance Pear, NP.,have documented all relevant documentation on the behalf of Nance Pear, NP,as directed by  Nance Pear, NP while in the presence of Nance Pear, NP.    Nance Pear, NP

## 2022-03-02 NOTE — Assessment & Plan Note (Signed)
Clinically improved following rx with amoxicillin/zpak.  There were concerns on the CT chest about possible RLL Mass.  She is scheduled to see Dr. [name] (pulmonology) tomorrow. Will defer further imaging/work up to pulmonology.

## 2022-03-02 NOTE — Assessment & Plan Note (Signed)
BP stable on amlodipine. Continue same.

## 2022-03-02 NOTE — Assessment & Plan Note (Signed)
She has been treated initially with doxycycline, then zpak/amoxicillin.  I would like to get a CT of her sinus before considering additional antibiotics.  In the meantime, recommended that she add claritin, mucinex, continue nasal saline spray.

## 2022-03-02 NOTE — Assessment & Plan Note (Signed)
Currently maintained on lexapro '10mg'$ .  Overall mood has been stable. Having some family stressors. She is followed by Dr. Toy Care.

## 2022-03-03 ENCOUNTER — Encounter: Payer: Self-pay | Admitting: Pulmonary Disease

## 2022-03-03 ENCOUNTER — Telehealth (HOSPITAL_BASED_OUTPATIENT_CLINIC_OR_DEPARTMENT_OTHER): Payer: Self-pay

## 2022-03-03 ENCOUNTER — Ambulatory Visit: Payer: Medicare Other | Admitting: Pulmonary Disease

## 2022-03-03 ENCOUNTER — Ambulatory Visit (INDEPENDENT_AMBULATORY_CARE_PROVIDER_SITE_OTHER): Payer: Medicare Other

## 2022-03-03 VITALS — BP 130/76 | HR 70 | Temp 98.1°F | Ht 61.5 in | Wt 167.6 lb

## 2022-03-03 DIAGNOSIS — R059 Cough, unspecified: Secondary | ICD-10-CM

## 2022-03-03 DIAGNOSIS — J9811 Atelectasis: Secondary | ICD-10-CM | POA: Diagnosis not present

## 2022-03-03 NOTE — Patient Instructions (Signed)
I am glad you are starting to improve after recent pneumonia Will get a CT chest without contrast in 1 month for follow-up Return to clinic in 3 months.

## 2022-03-03 NOTE — Progress Notes (Signed)
[name]    [id number]    Sep 06, 1950  Primary Care Physician:O'Sullivan, Lenna Sciara, NP  Referring Physician: [name], NP [address] [place],  [place] [address]  Chief complaint: Chronic cough  HPI: 71 y.o. with history of hypertension, allergies, chronic headache, sacral radiculopathy with epidural fibrosis.  Complains of chronic cough for the past 3 months.  Treated with Z-Pak, Tessalon, prednisone taper with no improvement in March 2020, this was followed by Augmentin, albuterol inhaler and second round of prednisone and early April.  She was also evaluated in the ED on 07/10/2019 with chest x-ray showing minimal basal atelectasis.  Has constant irritation, congestion at the back of the throat.  Has uncontrolled rhinitis, postnasal drip and acid reflux for which she takes Flonase [which does not help much], Zyrtec, omeprazole once a day  She has history of severe allergies, rhinitis, postnasal drip.  She had sinus surgery at age 64 which resulted in a nasal perforation.   Pets: 4 cats.  She is allergic to cats Occupation: Retired Web designer Exposures: Had mold in the previous home.  She moved out of this home in the year 2000.  No ongoing exposures with mold, hot tub, Jacuzzi.  No down pillows or comforters. Smoking history: 4-pack-year smoker.  Quit in 1990 Travel history: Originally from [place].  Previously lived in [place] Relevant family history: No significant family history of lung disease  Interval history: Seen back in clinic after a period of 2 years She developed community-acquired pneumonia in the right lung last month requiring amoxicillin and Z-Pak.  CT showed right lower lobe consolidation which will need follow-up. Overall she is improved but continues to have some persistent cough, congestion.  Outpatient Encounter Medications as of 03/03/2022  Medication Sig   amLODipine (NORVASC) 5 MG tablet TAKE 1 TABLET  (5 MG TOTAL) BY MOUTH DAILY.   clonazePAM (KLONOPIN) 1 MG tablet TAKE 1 TABLET BY MOUTH THREE TIMES A DAY AS NEEDED FOR ANXIETY   escitalopram (LEXAPRO) 10 MG tablet Take 1 tablet (10 mg total) by mouth daily.   fluticasone (FLONASE) 50 MCG/ACT nasal spray Place 2 sprays into both nostrils daily.   gabapentin (NEURONTIN) 100 MG capsule Take 2 capsules (200 mg total) by mouth 3 (three) times daily.   mirtazapine (REMERON) 45 MG tablet Take 45 mg by mouth at bedtime.    montelukast (SINGULAIR) 10 MG tablet TAKE 1 TABLET BY MOUTH EVERYDAY AT BEDTIME   Multiple Vitamin (MULTIVITAMIN) capsule Take 1 capsule by mouth daily. Dynamic brain   omeprazole (PRILOSEC) 40 MG capsule TAKE 1 CAPSULE (40 MG TOTAL) BY MOUTH IN THE MORNING AND AT BEDTIME.   ondansetron (ZOFRAN-ODT) 4 MG disintegrating tablet TAKE 1 TABLET BY MOUTH EVERY 8 HOURS AS NEEDED FOR NAUSEA AND VOMITING   traZODone (DESYREL) 50 MG tablet Take 50-150 mg by mouth at bedtime as needed for sleep.    [DISCONTINUED] albuterol (PROVENTIL) (2.5 MG/3ML) 0.083% nebulizer solution Take 3 mLs (2.5 mg total) by nebulization every 6 (six) hours as needed for wheezing or shortness of breath.   [DISCONTINUED] albuterol (VENTOLIN HFA) 108 (90 Base) MCG/ACT inhaler TAKE 2 PUFFS BY MOUTH EVERY 6 HOURS AS NEEDED FOR WHEEZE OR SHORTNESS OF BREATH   [DISCONTINUED] docusate sodium (COLACE) 100 MG capsule Take 1 capsule (100 mg total) by mouth 2 (two) times daily.   [DISCONTINUED] HYDROcodone bit-homatropine (HYCODAN) 5-1.5 MG/5ML syrup Take 5 mLs by mouth every 8 (eight)  hours as needed for cough.   [DISCONTINUED] linaclotide (LINZESS) 72 MCG capsule Twice a week   [DISCONTINUED] loratadine (CLARITIN) 10 MG tablet Take 1 tablet (10 mg total) by mouth daily.   No facility-administered encounter medications on file as of 03/03/2022.   Physical Exam: Blood pressure 130/76, pulse 70, temperature 98.1 F (36.7 C), temperature source Oral, height 5' 1.5" (1.562 m),  weight 167 lb 9.6 oz (76 kg), last menstrual period 03/29/1993, SpO2 97 %. Gen:      No acute distress HEENT:  EOMI, sclera anicteric Neck:     No masses; no thyromegaly Lungs:    Clear to auscultation bilaterally; normal respiratory effort CV:         Regular rate and rhythm; no murmurs Abd:      + bowel sounds; soft, non-tender; no palpable masses, no distension Ext:    No edema; adequate peripheral perfusion Skin:      Warm and dry; no rash Neuro: alert and oriented x 3 Psych: normal mood and affect   Data Reviewed: Imaging: Chest x-ray 07/10/2019-minimal left basilar scarring/atelectasis.    High-resolution CT 08/17/2019-no evidence of ILD, mild scarring at the lung base-postinfectious, coronary artery disease, aortic atherosclerosis.  CT chest 02/12/2022-small right effusion, irregular masslike consolidation in the right lower lobe, small pericardial effusion  PFTs 07/25/2019 FVC 2.84 (102%), FEV1 2.42 (115%), F/F 85, TLC 5.19 [110%], DLCO 16.91 [94%] Normal test  Labs: Respiratory allergy profile 5/40/21-IgE 25, RAST panel-negative CBC 07/31/2019-WBC 9.3, eos 4%, absolute eosinophil count 372  Assessment:  Right lower lobe pneumonia She is making slow improvements after treatment with antibiotics as an outpatient Obtain chest x-ray today and a follow-up CT in 1 month  Chronic cough Likely upper airway cough from postnasal drip, rhinitis, GERD. May have mild reactive airway disease given PFTs with no overt obstruction but improvement in mid flow rates post bronchodilators and mildly elevated peripheral eosinophils. CT reviewed with no significant ILD Continue Flonase, Singulair, antihistamines over-the-counter. Not on any inhalers at present  Evaluated by ENT in 2021 who recommended allergy consult.  Plan/Recommendations: Chest x-ray Follow-up CT scan in 1 month  Marshell Garfinkel MD [facility] 03/03/2022, 11:43 AM  CC: [name],  NP

## 2022-03-08 ENCOUNTER — Telehealth: Payer: Self-pay | Admitting: Pulmonary Disease

## 2022-03-08 NOTE — Telephone Encounter (Signed)
Called and spoke with pt letting her know recs per BW and she verbalized understanding. Nothing further needed.

## 2022-03-08 NOTE — Telephone Encounter (Signed)
Would recommend she take Mucinex 1200 mg twice daily.  Make sure she is taking reflux medication and using nasal spray such as fluticasone daily.  Otherwise no other recommendations until her office visit in 3 days.

## 2022-03-08 NOTE — Telephone Encounter (Signed)
Spoke with pt who states that she currently has increased productive cough, sore throat and loss of voice which pt states has never resolved since September  but at one point was improving. Pt states she feels like she has had a slight fever but denies GI upset. Pt was scheduled for acute OV with Tammy Parrett on 03/11/22 but would like advice in what to do in meantime. Beth can you please advise as Dr. [name] is unavailable

## 2022-03-09 ENCOUNTER — Ambulatory Visit (HOSPITAL_BASED_OUTPATIENT_CLINIC_OR_DEPARTMENT_OTHER)
Admission: RE | Admit: 2022-03-09 | Discharge: 2022-03-09 | Disposition: A | Discharge status: Home / Self Care | Payer: Medicare Other | Source: Ambulatory Visit | Attending: Family | Admitting: Family

## 2022-03-09 ENCOUNTER — Ambulatory Visit (HOSPITAL_BASED_OUTPATIENT_CLINIC_OR_DEPARTMENT_OTHER)
Admission: RE | Admit: 2022-03-09 | Discharge: 2022-03-09 | Disposition: A | Discharge status: Home / Self Care | Payer: Medicare Other | Source: Ambulatory Visit | Attending: Pulmonary Disease | Admitting: Pulmonary Disease

## 2022-03-09 DIAGNOSIS — R059 Cough, unspecified: Secondary | ICD-10-CM | POA: Diagnosis not present

## 2022-03-09 DIAGNOSIS — J019 Acute sinusitis, unspecified: Secondary | ICD-10-CM | POA: Insufficient documentation

## 2022-03-09 DIAGNOSIS — R918 Other nonspecific abnormal finding of lung field: Secondary | ICD-10-CM | POA: Diagnosis not present

## 2022-03-09 DIAGNOSIS — J320 Chronic maxillary sinusitis: Secondary | ICD-10-CM | POA: Diagnosis not present

## 2022-03-11 ENCOUNTER — Ambulatory Visit: Payer: Medicare Other | Admitting: Adult Health

## 2022-03-11 ENCOUNTER — Encounter: Payer: Self-pay | Admitting: Adult Health

## 2022-03-11 ENCOUNTER — Telehealth (HOSPITAL_COMMUNITY): Payer: Self-pay | Admitting: *Deleted

## 2022-03-11 VITALS — BP 130/70 | HR 72 | Temp 98.2°F | Ht 61.5 in | Wt 163.0 lb

## 2022-03-11 DIAGNOSIS — J189 Pneumonia, unspecified organism: Secondary | ICD-10-CM | POA: Diagnosis not present

## 2022-03-11 DIAGNOSIS — R0683 Snoring: Secondary | ICD-10-CM | POA: Diagnosis not present

## 2022-03-11 DIAGNOSIS — R059 Cough, unspecified: Secondary | ICD-10-CM

## 2022-03-11 DIAGNOSIS — K219 Gastro-esophageal reflux disease without esophagitis: Secondary | ICD-10-CM

## 2022-03-11 LAB — CBC WITH DIFFERENTIAL/PLATELET
Basophils Absolute: 0.1 10*3/uL (ref 0.0–0.1)
Basophils Relative: 1.1 % (ref 0.0–3.0)
Eosinophils Absolute: 0.5 10*3/uL (ref 0.0–0.7)
Eosinophils Relative: 5.3 % — ABNORMAL HIGH (ref 0.0–5.0)
HCT: 40 % (ref 36.0–46.0)
Hemoglobin: 13.5 g/dL (ref 12.0–15.0)
Lymphocytes Relative: 25.2 % (ref 12.0–46.0)
Lymphs Abs: 2.2 10*3/uL (ref 0.7–4.0)
MCHC: 33.8 g/dL (ref 30.0–36.0)
MCV: 90 fl (ref 78.0–100.0)
Monocytes Absolute: 0.6 10*3/uL (ref 0.1–1.0)
Monocytes Relative: 7.1 % (ref 3.0–12.0)
Neutro Abs: 5.3 10*3/uL (ref 1.4–7.7)
Neutrophils Relative %: 61.3 % (ref 43.0–77.0)
Platelets: 155 10*3/uL (ref 150.0–400.0)
RBC: 4.44 Mil/uL (ref 3.87–5.11)
RDW: 14.4 % (ref 11.5–15.5)
WBC: 8.7 10*3/uL (ref 4.0–10.5)

## 2022-03-11 LAB — SEDIMENTATION RATE: Sed Rate: 30 mm/hr (ref 0–30)

## 2022-03-11 MED ORDER — BENZONATATE 200 MG PO CAPS: 200.0000 mg | ORAL_CAPSULE | Freq: Three times a day (TID) | ORAL | 3 refills | Status: DC | PRN

## 2022-03-11 MED ORDER — ALBUTEROL SULFATE HFA 108 (90 BASE) MCG/ACT IN AERS: 1.0000 | INHALATION_SPRAY | Freq: Four times a day (QID) | RESPIRATORY_TRACT | 2 refills | Status: DC | PRN

## 2022-03-11 MED ORDER — PREDNISONE 10 MG PO TABS: ORAL_TABLET | ORAL | 0 refills | Status: DC

## 2022-03-11 MED ORDER — AMOXICILLIN-POT CLAVULANATE 875-125 MG PO TABS: 1.0000 | ORAL_TABLET | Freq: Two times a day (BID) | ORAL | 0 refills | Status: DC

## 2022-03-11 NOTE — Assessment & Plan Note (Signed)
Severe reflux.  Patient needs to restart PPI.  GERD diet.  Aspiration precautions discussed.  Set up for modified barium swallow  Plan  Patient Instructions  Restart Omeprazole '40mg'$  Twice daily  before meals .  Aspiration precautions  GERD diet.  Sleep at incline - wedge pillow .  Modified barium swallow.   Augmentin '875mg'$  Twice daily  for 1 week, take with food.  Delsym 2 Tsp Twice daily  for cough As needed   Tessalon Three times a day  As needed  cough  Labs today .  Prednisone taper as directed.  Zyrtec '10mg'$  At bedtime   Saline nasal spray Twice daily   Saline nasal gel. At bedtime   Albuterol inhaler As needed    Set up home sleep study .   Follow up with Dr [name] or Canaan Holzer NP in 2-3 weeks and As needed   Please contact office for sooner follow up if symptoms do not improve or worsen or seek emergency care

## 2022-03-11 NOTE — Telephone Encounter (Signed)
Attempted to contact patient to schedule OP MBS. Left VM. RKEEL 

## 2022-03-11 NOTE — Assessment & Plan Note (Signed)
Snoring, daytime sleepiness, restless sleep all suspicious for underlying sleep apnea.  Set patient up for home sleep study.  Patient education given on sleep apnea  - discussed how weight can impact sleep and risk for sleep disordered breathing - discussed options to assist with weight loss: combination of diet modification, cardiovascular and strength training exercises   - had an extensive discussion regarding the adverse health consequences related to untreated sleep disordered breathing - specifically discussed the risks for hypertension, coronary artery disease, cardiac dysrhythmias, cerebrovascular disease, and diabetes - lifestyle modification discussed   - discussed how sleep disruption can increase risk of accidents, particularly when driving - safe driving practices were discussed   Plan  Patient Instructions  Restart Omeprazole '40mg'$  Twice daily  before meals .  Aspiration precautions  GERD diet.  Sleep at incline - wedge pillow .  Modified barium swallow.   Augmentin '875mg'$  Twice daily  for 1 week, take with food.  Delsym 2 Tsp Twice daily  for cough As needed   Tessalon Three times a day  As needed  cough  Labs today .  Prednisone taper as directed.  Zyrtec '10mg'$  At bedtime   Saline nasal spray Twice daily   Saline nasal gel. At bedtime   Albuterol inhaler As needed    Set up home sleep study .   Follow up with Dr [name] or Noboru Bidinger NP in 2-3 weeks and As needed   Please contact office for sooner follow up if symptoms do not improve or worsen or seek emergency care

## 2022-03-11 NOTE — Patient Instructions (Addendum)
Restart Omeprazole '40mg'$  Twice daily  before meals .  Aspiration precautions  GERD diet.  Sleep at incline - wedge pillow .  Modified barium swallow.   Augmentin '875mg'$  Twice daily  for 1 week, take with food.  Delsym 2 Tsp Twice daily  for cough As needed   Tessalon Three times a day  As needed  cough  Labs today .  Prednisone taper as directed.  Zyrtec '10mg'$  At bedtime   Saline nasal spray Twice daily   Saline nasal gel. At bedtime   Albuterol inhaler As needed    Set up home sleep study .   Follow up with Dr [name] or Alioune Hodgkin NP in 2-3 weeks and As needed   Please contact office for sooner follow up if symptoms do not improve or worsen or seek emergency care

## 2022-03-11 NOTE — Assessment & Plan Note (Signed)
Severe right-sided pneumonia with masslike consolidation of the right lower lobe and surrounding groundglass opacities.  Follow-up CT has shown a decrease in right lower lobe consolidation however new areas of groundglass nodules in the right middle lobe right upper lobe and left upper lobe.  Questionable infectious versus inflammatory.  Could have a component of COP .  Will check CBC with differential and sed rate.  Also concern for possible aspiration pneumonia as patient has severe uncontrolled reflux.  Will check modified barium swallow.  Restart PPI therapy.  Aspiration precautions discussed in detail.  Will treat with empiric antibiotics and steroids.  Have close follow-up in 2 weeks in the office.  Will need a follow-up CT in 6 to 8 weeks.  If no radiographic improvement and continues to have ongoing clinical symptoms will need to consider bronchoscopy.  Plan  Patient Instructions  Restart Omeprazole '40mg'$  Twice daily  before meals .  Aspiration precautions  GERD diet.  Sleep at incline - wedge pillow .  Modified barium swallow.   Augmentin '875mg'$  Twice daily  for 1 week, take with food.  Delsym 2 Tsp Twice daily  for cough As needed   Tessalon Three times a day  As needed  cough  Labs today .  Prednisone taper as directed.  Zyrtec '10mg'$  At bedtime   Saline nasal spray Twice daily   Saline nasal gel. At bedtime   Albuterol inhaler As needed    Set up home sleep study .   Follow up with Dr [name] or Imberly Troxler NP in 2-3 weeks and As needed   Please contact office for sooner follow up if symptoms do not improve or worsen or seek emergency care

## 2022-03-11 NOTE — Progress Notes (Signed)
$'@Patient'o$  ID: [name], female    DOB: Jun 26, 1950, 71 y.o.   MRN: [id number]  Chief Complaint  Patient presents with   Acute Visit    Referring provider: [name], NP  HPI: 71 year old female followed for chronic cough  TEST/EVENTS :  Pets: 4 cats.  She is allergic to cats Occupation: Retired Web designer Exposures: Had mold in the previous home.  She moved out of this home in the year 2000.  No ongoing exposures with mold, hot tub, Jacuzzi.  No down pillows or comforters. Smoking history: 4-pack-year smoker.  Quit in 1990 Travel history: Originally from [place].  Previously lived in [place] Relevant family history: No significant family history of lung disease  Chest x-ray 07/10/2019-minimal left basilar scarring/atelectasis.     High-resolution CT 08/17/2019-no evidence of ILD, mild scarring at the lung base-postinfectious, coronary artery disease, aortic atherosclerosis.  PFTs 07/25/2019 FVC 2.84 (102%), FEV1 2.42 (115%), F/F 85, TLC 5.19 [110%], DLCO 16.91 [94%] Normal test   Labs: Respiratory allergy profile 5/40/21-IgE 25, RAST panel-negative CBC 07/31/2019-WBC 9.3, eos 4%, absolute eosinophil count 372  03/11/2022 Acute OV : Pneumonia  Patient presents for an acute office visit.  Patient complains of ongoing cough, congestion general malaise and fatigue.  Patient was initially treated for bronchitis and pneumonia 3 months ago.  Chest x-ray October 4 showed streaky opacities in the left base.  She was initially treated with doxycycline, Then started on amoxicillin and azithromycin for ongoing bronchitic symptoms.  Subsequent CT chest on February 11, 2022 was done due to ongoing cough and congestion.  It showed a small right-sided pleural effusion and an irregular masslike consolidation in the right lower lobe.  Surrounding groundglass density and irregular consolidation.  Patient was seen in the office last week,  she was set up for a CT sinus that  showed mild mucosal thickening in the left maxillary sinus.  CT chest showed masslike consolidation in the right lower lobe has decreased in volume measuring 27 x 19 mm (previously 39 x 26) and decrease adjacent nodular airspace disease.  New area of density peripheral to the dominant mass consolidation.  And new areas of groundglass nodules in the right middle lobe and right upper lobe.  And a new area in the left upper lobe.  Not taking PPI . No history of pneumonia   Severe cough, throat is very sore, burning from so much coughing. Not able to cough up anything. Has severe GERD at times. Gets choked easily , has had GERD in middle night come into her throat and she has had to vomit.   Complains of snoring. Spouse reports severe snoring. Has daytime sleepiness, naps often.  No History of CVA or CHF . No Oxygen. Does have  Chronic back and leg pain ,Takes  klonopin, gabapentin, remeron , and trazodone.    Allergies  Allergen Reactions   Compazine [Prochlorperazine]     hyperactivity   Morphine And Related Hives and Itching   Trintellix [Vortioxetine] Nausea And Vomiting    Immunization History  Administered Date(s) Administered   Fluad Quad(high Dose 65+) 01/01/2021, 12/19/2021   Influenza, High Dose Seasonal PF 01/09/2016, 12/15/2016, 12/14/2017, 12/01/2018   Influenza,inj,Quad PF,6+ Mos 12/25/2013, 12/13/2014   Influenza-Unspecified 12/05/2019, 12/19/2021   Moderna Sars-Covid-2 Vaccination 12/19/2021   PFIZER(Purple Top)SARS-COV-2 Vaccination 05/03/2019, 05/24/2019, 12/05/2019   Pfizer Covid-19 Vaccine Bivalent Booster 29yr & up 01/17/2021   Pneumococcal Conjugate-13 02/17/2018   Pneumococcal Polysaccharide-23 12/13/2014, 03/02/2022   RSV,unspecified 12/19/2021   Td 06/06/2008  Zoster Recombinat (Shingrix) 12/11/2018, 03/26/2019   Zoster, Live 01/15/2011    Past Medical History:  Diagnosis Date   AKI (acute kidney injury) ([facility]) 01/09/2020   Allergy    seasonal   Anxiety     Arachnoiditis    Cataract    Depression    Elevated liver enzymes    Environmental allergies    GERD (gastroesophageal reflux disease)    H/O being hospitalized 03/2012   x1 week for nausea   History of chicken pox    Hyperlipidemia    Hypertension    Migraine with typical aura    resolved years ago   Nausea    chronic nausea    Neuromuscular disorder (HCC)     Tobacco History: Social History   Tobacco Use  Smoking Status Former   Packs/day: 0.25   Years: 8.00   Total pack years: 2.00   Types: Cigarettes   Quit date: 08/09/1986   Years since quitting: 35.6  Smokeless Tobacco Never   Counseling given: Not Answered   Outpatient Medications Prior to Visit  Medication Sig Dispense Refill   amLODipine (NORVASC) 5 MG tablet TAKE 1 TABLET (5 MG TOTAL) BY MOUTH DAILY. 90 tablet 1   clonazePAM (KLONOPIN) 1 MG tablet TAKE 1 TABLET BY MOUTH THREE TIMES A DAY AS NEEDED FOR ANXIETY 90 tablet 0   escitalopram (LEXAPRO) 10 MG tablet Take 1 tablet (10 mg total) by mouth daily.     fluticasone (FLONASE) 50 MCG/ACT nasal spray Place 2 sprays into both nostrils daily. 16 g 6   gabapentin (NEURONTIN) 100 MG capsule Take 2 capsules (200 mg total) by mouth 3 (three) times daily. 180 capsule 1   mirtazapine (REMERON) 45 MG tablet Take 45 mg by mouth at bedtime.      montelukast (SINGULAIR) 10 MG tablet TAKE 1 TABLET BY MOUTH EVERYDAY AT BEDTIME 90 tablet 1   Multiple Vitamin (MULTIVITAMIN) capsule Take 1 capsule by mouth daily. Dynamic brain     omeprazole (PRILOSEC) 40 MG capsule TAKE 1 CAPSULE (40 MG TOTAL) BY MOUTH IN THE MORNING AND AT BEDTIME. 180 capsule 1   ondansetron (ZOFRAN-ODT) 4 MG disintegrating tablet TAKE 1 TABLET BY MOUTH EVERY 8 HOURS AS NEEDED FOR NAUSEA AND VOMITING 30 tablet 0   traZODone (DESYREL) 50 MG tablet Take 50-150 mg by mouth at bedtime as needed for sleep.      No facility-administered medications prior to visit.     Review of Systems:   Constitutional:    No  weight loss, night sweats,  Fevers, chills,  +fatigue, or  lassitude.  HEENT:   No headaches,  Difficulty swallowing,  Tooth/dental problems, or  Sore throat,                No sneezing, itching, ear ache,  +nasal congestion, post nasal drip,   CV:  No chest pain,  Orthopnea, PND, swelling in lower extremities, anasarca, dizziness, palpitations, syncope.   GI  No , abdominal pain, nausea, vomiting, diarrhea, change in bowel habits, loss of appetite, bloody stools.   Resp: .  No chest wall deformity  Skin: no rash or lesions.  GU: no dysuria, change in color of urine, no urgency or frequency.  No flank pain, no hematuria   MS:  No joint pain or swelling.  No decreased range of motion.  No back pain.    Physical Exam  BP 130/70 (BP Location: Left Arm, Patient Position: Sitting, Cuff Size: Large)   Pulse 72  Temp 98.2 F (36.8 C) (Oral)   Ht 5' 1.5" (1.562 m)   Wt 163 lb (73.9 kg)   LMP 03/29/1993 Comment: 12/25/13 Pt states last cycle has been about 20 yrs ago.  SpO2 93%   BMI 30.30 kg/m   GEN: A/Ox3; pleasant , NAD, well nourished    HEENT:  [place]/AT,   NOSE-clear, THROAT-clear, no lesions, no postnasal drip or exudate noted.   NECK:  Supple w/ fair ROM; no JVD; normal carotid impulses w/o bruits; no thyromegaly or nodules palpated; no lymphadenopathy.    RESP  scattered rhonchi   no accessory muscle use, no dullness to percussion  CARD:  RRR, no m/r/g, no peripheral edema, pulses intact, no cyanosis or clubbing.  GI:   Soft & nt; nml bowel sounds; no organomegaly or masses detected.   Musco: Warm bil, no deformities or joint swelling noted.   Neuro: alert, no focal deficits noted.    Skin: Warm, no lesions or rashes    Lab Results:  CBC    Component Value Date/Time   WBC 8.5 07/07/2021 1133   RBC 4.47 07/07/2021 1133   HGB 13.9 07/07/2021 1133   HCT 40.8 07/07/2021 1133   PLT 117.0 (L) 07/07/2021 1133   MCV 91.4 07/07/2021 1133   MCH 29.8 01/18/2020  1129   MCHC 34.1 07/07/2021 1133   RDW 13.6 07/07/2021 1133   LYMPHSABS 1.6 07/07/2021 1133   MONOABS 0.7 07/07/2021 1133   EOSABS 0.3 07/07/2021 1133   BASOSABS 0.0 07/07/2021 1133    BMET    Component Value Date/Time   NA 139 01/06/2022 1110   K 4.0 01/06/2022 1110   CL 106 01/06/2022 1110   CO2 25 01/06/2022 1110   GLUCOSE 86 01/06/2022 1110   BUN 19 01/06/2022 1110   CREATININE 0.98 01/06/2022 1110   CREATININE 0.93 02/28/2020 1254   CALCIUM 9.1 01/06/2022 1110   GFRNONAA 63 02/28/2020 1254   GFRAA 73 02/28/2020 1254    BNP No results found for: "BNP"  ProBNP No results found for: "PROBNP"  Imaging: CT Chest Wo Contrast  Result Date: 03/10/2022 CLINICAL DATA:  Pneumonia.  Follow-up.  Persistent cough. EXAM: CT CHEST WITHOUT CONTRAST TECHNIQUE: Multidetector CT imaging of the chest was performed following the standard protocol without IV contrast. RADIATION DOSE REDUCTION: This exam was performed according to the departmental dose-optimization program which includes automated exposure control, adjustment of the mA and/or kV according to patient size and/or use of iterative reconstruction technique. COMPARISON:  CT 02/11/2022 FINDINGS: Cardiovascular: Coronary artery calcification and aortic atherosclerotic calcification. Mediastinum/Nodes: Small amount of pericardial fluid anterior to the RIGHT heart unchanged Lungs/Pleura: Masslike consolidation in the RIGHT lower lobe is decreased in volume. Consolidation measures 27 x 19 mm (image 77/3) decreased from 39 by 26 mm. Interval decrease in the nodular airspace disease adjacent to the dominant consolidation. There is a new band of parenchymal density peripheral to the dominant mass consolidation (image 61/5) There several subtle new ground-glass nodules in the RIGHT middle lobe, RIGHT upper lobe (image 77/3 image 59/3). Similar findings with new subtle ill-defined nodules in the LEFT upper lobe (image 58/3). Upper Abdomen: Limited  view of the liver, kidneys, pancreas are unremarkable. Normal adrenal glands. Pneumobilia suggests prior sphincterotomy. Postcholecystectomy. Musculoskeletal: No aggressive osseous lesion. IMPRESSION: 1. Dominant masslike consolidation in the RIGHT lobe is decrease in size in the interval. Decreased peripheral nodular airspace disease adjacent to the dominant nodule. Recommend an additional follow-up CT (6-8 weeks) to better demonstrate resolution  of this mass/nodule/consolidation. 2. New subtle airspace nodularity in the RIGHT middle lobe, RIGHT upper lobe LEFT upper lobe. Findings suggest recurrent or residual pulmonary infection or inflammation. Consider pulmonology consultation. 3. Pneumobilia presumed related to prior sphincterotomy. Electronically Signed   By: Suzy Bouchard M.D.   On: 03/10/2022 13:43   CT MAXILLOFACIAL WO CONTRAST  Result Date: 03/09/2022 CLINICAL DATA:  recurrent sinusitis EXAM: CT MAXILLOFACIAL WITHOUT CONTRAST TECHNIQUE: Multidetector CT imaging of the maxillofacial structures was performed. Multiplanar CT image reconstructions were also generated. RADIATION DOSE REDUCTION: This exam was performed according to the departmental dose-optimization program which includes automated exposure control, adjustment of the mA and/or kV according to patient size and/or use of iterative reconstruction technique. COMPARISON:  None Available. FINDINGS: Osseous: No fracture or mandibular dislocation. No destructive process. Orbits: Bilateral lens replacement. No traumatic or inflammatory finding. Sinuses: Mild mucosal thickening of the left maxillary sinus. Otherwise paranasal sinuses and mastoid air cells are clear. Anatomically, the left maxillary sinus is smaller than the right. Soft tissues: Negative. Limited intracranial: No significant or unexpected finding. IMPRESSION: Mild mucosal thickening of the left maxillary sinus. Anatomically, the left maxillary sinus is smaller than the right.  Electronically Signed   By: Iven Finn M.D.   On: 03/09/2022 20:14   DG Chest 2 View  Result Date: 03/05/2022 CLINICAL DATA:  Cough, post pneumonia. EXAM: CHEST - 2 VIEW COMPARISON:  12/30/2021, 02/11/2022. FINDINGS: The heart size and mediastinal contours are within normal limits. Linear bandlike opacities are noted in the mid to lower right lung. There is mild atelectasis at the left lung base. No effusion or pneumothorax mild degenerative changes in the thoracic spine. No acute osseous abnormality. Surgical clips are present in the right upper quadrant. IMPRESSION: Linear bandlike opacities in the mid to lower right lung, possible atelectasis versus residual infiltrate. Electronically Signed   By: Brett Fairy M.D.   On: 03/05/2022 02:15   CT Chest Wo Contrast  Result Date: 02/12/2022 CLINICAL DATA:  Respiratory illness non improving cough with occasional fever EXAM: CT CHEST WITHOUT CONTRAST TECHNIQUE: Multidetector CT imaging of the chest was performed following the standard protocol without IV contrast. RADIATION DOSE REDUCTION: This exam was performed according to the departmental dose-optimization program which includes automated exposure control, adjustment of the mA and/or kV according to patient size and/or use of iterative reconstruction technique. COMPARISON:  Chest x-ray 12/30/2021, chest CT 08/17/2019 FINDINGS: Cardiovascular: Limited without intravenous contrast. Moderate aortic atherosclerosis. No aneurysm. Mild coronary vascular calcification. Normal cardiac size. Small pericardial effusion Mediastinum/Nodes: Midline trachea. No thyroid mass. Subcentimeter mediastinal lymph nodes. Esophagus within normal limits. Lungs/Pleura: Small right-sided pleural effusion. Irregular masslike consolidation in the right lower lobe. Surrounding ground-glass density and multiple small foci of irregular consolidation. Upper Abdomen: Partially visualized pneumobilia. Musculoskeletal: No chest wall  mass or suspicious bone lesions identified. IMPRESSION: 1. Small right-sided pleural effusion. Irregular masslike consolidation in the right lower lobe with surrounding ground-glass density and multiple small foci of irregular consolidation. Given rapid progression since 12/30/2021, findings probably represent pneumonia, however given masslike configuration of the dominant area of consolidation, imaging follow-up to resolution is advised. 2. Small pericardial effusion. 3. Partially visualized pneumobilia, correlate for history of prior sphincterotomy. 4. Aortic atherosclerosis. Aortic Atherosclerosis (ICD10-I70.0). These results will be called to the ordering clinician or representative by the Radiologist Assistant, and communication documented in the PACS or [employer]. Electronically Signed   By: Donavan Foil M.D.   On: 02/12/2022 21:56  Latest Ref Rng & Units 07/25/2019   12:41 PM  PFT Results  FVC-Pre L 2.64   FVC-Predicted Pre % 95   FVC-Post L 2.84   FVC-Predicted Post % 102   Pre FEV1/FVC % % 83   Post FEV1/FCV % % 85   FEV1-Pre L 2.18   FEV1-Predicted Pre % 103   FEV1-Post L 2.42   DLCO uncorrected ml/min/mmHg 16.91   DLCO UNC% % 94   DLVA Predicted % 84   TLC L 5.19   TLC % Predicted % 110   RV % Predicted % 107     No results found for: "NITRICOXIDE"      Assessment & Plan:   Pneumonia Severe right-sided pneumonia with masslike consolidation of the right lower lobe and surrounding groundglass opacities.  Follow-up CT has shown a decrease in right lower lobe consolidation however new areas of groundglass nodules in the right middle lobe right upper lobe and left upper lobe.  Questionable infectious versus inflammatory.  Could have a component of COP .  Will check CBC with differential and sed rate.  Also concern for possible aspiration pneumonia as patient has severe uncontrolled reflux.  Will check modified barium swallow.  Restart PPI therapy.  Aspiration  precautions discussed in detail.  Will treat with empiric antibiotics and steroids.  Have close follow-up in 2 weeks in the office.  Will need a follow-up CT in 6 to 8 weeks.  If no radiographic improvement and continues to have ongoing clinical symptoms will need to consider bronchoscopy.  Plan  Patient Instructions  Restart Omeprazole '40mg'$  Twice daily  before meals .  Aspiration precautions  GERD diet.  Sleep at incline - wedge pillow .  Modified barium swallow.   Augmentin '875mg'$  Twice daily  for 1 week, take with food.  Delsym 2 Tsp Twice daily  for cough As needed   Tessalon Three times a day  As needed  cough  Labs today .  Prednisone taper as directed.  Zyrtec '10mg'$  At bedtime   Saline nasal spray Twice daily   Saline nasal gel. At bedtime   Albuterol inhaler As needed    Set up home sleep study .   Follow up with Dr [name] or Meshell Abdulaziz NP in 2-3 weeks and As needed   Please contact office for sooner follow up if symptoms do not improve or worsen or seek emergency care       GERD (gastroesophageal reflux disease) Severe reflux.  Patient needs to restart PPI.  GERD diet.  Aspiration precautions discussed.  Set up for modified barium swallow  Plan  Patient Instructions  Restart Omeprazole '40mg'$  Twice daily  before meals .  Aspiration precautions  GERD diet.  Sleep at incline - wedge pillow .  Modified barium swallow.   Augmentin '875mg'$  Twice daily  for 1 week, take with food.  Delsym 2 Tsp Twice daily  for cough As needed   Tessalon Three times a day  As needed  cough  Labs today .  Prednisone taper as directed.  Zyrtec '10mg'$  At bedtime   Saline nasal spray Twice daily   Saline nasal gel. At bedtime   Albuterol inhaler As needed    Set up home sleep study .   Follow up with Dr [name] or Brogan England NP in 2-3 weeks and As needed   Please contact office for sooner follow up if symptoms do not improve or worsen or seek emergency care       Snoring Snoring,  daytime  sleepiness, restless sleep all suspicious for underlying sleep apnea.  Set patient up for home sleep study.  Patient education given on sleep apnea  - discussed how weight can impact sleep and risk for sleep disordered breathing - discussed options to assist with weight loss: combination of diet modification, cardiovascular and strength training exercises   - had an extensive discussion regarding the adverse health consequences related to untreated sleep disordered breathing - specifically discussed the risks for hypertension, coronary artery disease, cardiac dysrhythmias, cerebrovascular disease, and diabetes - lifestyle modification discussed   - discussed how sleep disruption can increase risk of accidents, particularly when driving - safe driving practices were discussed   Plan  Patient Instructions  Restart Omeprazole '40mg'$  Twice daily  before meals .  Aspiration precautions  GERD diet.  Sleep at incline - wedge pillow .  Modified barium swallow.   Augmentin '875mg'$  Twice daily  for 1 week, take with food.  Delsym 2 Tsp Twice daily  for cough As needed   Tessalon Three times a day  As needed  cough  Labs today .  Prednisone taper as directed.  Zyrtec '10mg'$  At bedtime   Saline nasal spray Twice daily   Saline nasal gel. At bedtime   Albuterol inhaler As needed    Set up home sleep study .   Follow up with Dr [name] or Tyanne Derocher NP in 2-3 weeks and As needed   Please contact office for sooner follow up if symptoms do not improve or worsen or seek emergency care       I spent  45  minutes dedicated to the care of this patient on the date of this encounter to include pre-visit review of records, face-to-face time with the patient discussing conditions above, post visit ordering of testing, clinical documentation with the electronic health record, making appropriate referrals as documented, and communicating necessary findings to members of the patients care team.    Rexene Edison,  NP 03/11/2022

## 2022-03-12 ENCOUNTER — Other Ambulatory Visit (HOSPITAL_COMMUNITY): Payer: Self-pay | Admitting: *Deleted

## 2022-03-12 DIAGNOSIS — R1310 Dysphagia, unspecified: Secondary | ICD-10-CM

## 2022-03-15 NOTE — Progress Notes (Signed)
ATC x1.  No answer.  LVM for patient to return call.

## 2022-03-17 ENCOUNTER — Other Ambulatory Visit: Payer: Self-pay | Admitting: Family

## 2022-03-23 ENCOUNTER — Ambulatory Visit: Payer: Medicare Other | Admitting: Adult Health

## 2022-03-30 ENCOUNTER — Ambulatory Visit (HOSPITAL_COMMUNITY)
Admission: RE | Admit: 2022-03-30 | Discharge: 2022-03-30 | Disposition: A | Discharge status: Home / Self Care | Payer: Medicare Other | Source: Ambulatory Visit | Attending: Family | Admitting: Family

## 2022-03-30 DIAGNOSIS — R059 Cough, unspecified: Secondary | ICD-10-CM | POA: Insufficient documentation

## 2022-03-30 DIAGNOSIS — R1310 Dysphagia, unspecified: Secondary | ICD-10-CM

## 2022-03-30 DIAGNOSIS — R1314 Dysphagia, pharyngoesophageal phase: Secondary | ICD-10-CM | POA: Insufficient documentation

## 2022-03-30 NOTE — Therapy (Signed)
Modified Barium Swallow Progress Note  Patient Details  Name: Harshita Bernales MRN: [id number] Date of Birth: [birth year]  Today's Date: 03/30/2022  Modified Barium Swallow completed.  Full report located under Chart Review in the Imaging Section.  Brief recommendations include the following:  Clinical Impression  Patient presents with a normal oral and pharyngeal phase of swallow with no aspiration of any of the tested barium consistencies: thin liquid,nectar thick liquid puree solid, regular solid, 17m barium tablet. During esophageal sweep, barium stasis (suspected to be from solid textures) observed at level of upper thoracic portion of esophagus. Ms. NPlochdid report globus sensation (points to throat) at same time as when barium stasis observed. She reported that she has to drink extra sips of liquids to clear globus sensation when eating. SLP recommending f/u from PCP and may benefit from further esophageal assessment.   Swallow Evaluation Recommendations   Recommended Consults: Consider esophageal assessment   SLP Diet Recommendations: Regular solids;Thin liquid   Liquid Administration via: Straw;Cup   Medication Administration: Whole meds with liquid           Postural Changes: Remain semi-upright after after feeds/meals (Comment) (30-45 minutes after)            JSonia Baller MA, CCC-SLP Speech Therapy

## 2022-04-13 ENCOUNTER — Encounter: Payer: Self-pay | Admitting: Adult Health

## 2022-04-13 ENCOUNTER — Ambulatory Visit (INDEPENDENT_AMBULATORY_CARE_PROVIDER_SITE_OTHER): Payer: Medicare Other

## 2022-04-13 ENCOUNTER — Ambulatory Visit: Payer: Medicare Other | Admitting: Adult Health

## 2022-04-13 ENCOUNTER — Other Ambulatory Visit: Payer: Self-pay | Admitting: *Deleted

## 2022-04-13 VITALS — BP 114/78 | HR 73 | Temp 98.5°F | Ht 61.5 in | Wt 170.4 lb

## 2022-04-13 DIAGNOSIS — J189 Pneumonia, unspecified organism: Secondary | ICD-10-CM

## 2022-04-13 DIAGNOSIS — G4733 Obstructive sleep apnea (adult) (pediatric): Secondary | ICD-10-CM

## 2022-04-13 DIAGNOSIS — R0683 Snoring: Secondary | ICD-10-CM | POA: Diagnosis not present

## 2022-04-13 DIAGNOSIS — K219 Gastro-esophageal reflux disease without esophagitis: Secondary | ICD-10-CM

## 2022-04-13 DIAGNOSIS — J4520 Mild intermittent asthma, uncomplicated: Secondary | ICD-10-CM | POA: Diagnosis not present

## 2022-04-13 DIAGNOSIS — R918 Other nonspecific abnormal finding of lung field: Secondary | ICD-10-CM | POA: Diagnosis not present

## 2022-04-13 DIAGNOSIS — R059 Cough, unspecified: Secondary | ICD-10-CM

## 2022-04-13 NOTE — Assessment & Plan Note (Signed)
Home sleep study is pending

## 2022-04-13 NOTE — Progress Notes (Signed)
$'@Patient'D$  ID: Anthoney Harada, female    DOB: [birth year], 72 y.o.   MRN: [id number]  Chief Complaint  Patient presents with   Follow-up    Referring provider: [name], NP  HPI: 72 year old female former smoker followed for chronic cough  TEST/EVENTS :  Pets: 4 cats.  She is allergic to cats Occupation: Retired Web designer Exposures: Had mold in the previous home.  She moved out of this home in the year 2000.  No ongoing exposures with mold, hot tub, Jacuzzi.  No down pillows or comforters. Smoking history: 4-pack-year smoker.  Quit in 1990 Travel history: Originally from [place].  Previously lived in [place] Relevant family history: No significant family history of lung disease   Chest x-ray 07/10/2019-minimal left basilar scarring/atelectasis.     High-resolution CT 08/17/2019-no evidence of ILD, mild scarring at the lung base-postinfectious, coronary artery disease, aortic atherosclerosis.   PFTs 07/25/2019 FVC 2.84 (102%), FEV1 2.42 (115%), F/F 85, TLC 5.19 [110%], DLCO 16.91 [94%] Normal test   Labs: Respiratory allergy profile 5/40/21-IgE 25, RAST panel-negative CBC 07/31/2019-WBC 9.3, eos 4%, absolute eosinophil count 372  04/13/2022 Follow up : Pneumonia  Patient presents for a 1 month follow-up.  Patient was treated for an acute bronchitis and pneumonia in October 2023 with x-ray showing left basilar opacities.  She was initially treated with doxycycline.  She had ongoing symptoms with cough and congestion.  Started on amoxicillin and azithromycin.  Subsequent CT chest on February 11, 2022 showed a small right-sided pleural effusion and irregular masslike consolidation in the right lower lobe.  Surrounding groundglass density and irregular consolidation.  CT sinus showed mild mucosal thickening.  Follow-up CT chest March 09, 2022 decrease in masslike consolidation in the right lower lobe.  Decreased nodular airspace disease adjacent to the dominant  nodule, new right middle lobe and right upper lobe and left upper lobe airspace nodularity. Last visit patient was treated with 7-day course of Augmentin  and prednisone taper for possible superimposed new pneumonia.  She was set up for a modified barium swallow that was done on March 30, 2022 that showed normal oral and pharyngeal phase of swallow with no aspiration, mild pharyngoesophageal phase with recommendations for regular solids, thin liquids and remain sitting upright after meals.  Lab work showed elevated eosinophils with absolute count at 500.  Sed rate was 30. Since last office visit she is feeling better with decreased cough and congestion.  Still has some intermittent hoarseness and sinus drainage.  Her and her husband have had their house inspected and is going to be having a new humidification system installed and the [place] replaced.  Feels that this could be contributing to some of her symptoms. Chest x-ray today showed stable platelike opacities in the right lung base favoring scarring versus atelectasis.  No new lung findings.  Previous subtle findings on the CT do not show up on x-ray today.    Allergies  Allergen Reactions   Compazine [Prochlorperazine]     hyperactivity   Morphine And Related Hives and Itching   Trintellix [Vortioxetine] Nausea And Vomiting    Immunization History  Administered Date(s) Administered   Fluad Quad(high Dose 65+) 01/01/2021, 12/19/2021   Influenza, High Dose Seasonal PF 01/09/2016, 12/15/2016, 12/14/2017, 12/01/2018   Influenza,inj,Quad PF,6+ Mos 12/25/2013, 12/13/2014   Influenza-Unspecified 12/05/2019, 12/19/2021   Moderna Sars-Covid-2 Vaccination 12/19/2021   PFIZER(Purple Top)SARS-COV-2 Vaccination 05/03/2019, 05/24/2019, 12/05/2019   Pfizer Covid-19 Vaccine Bivalent Booster 72yr & up 01/17/2021   Pneumococcal Conjugate-13 02/17/2018  Pneumococcal Polysaccharide-23 12/13/2014, 03/02/2022   RSV,unspecified 12/19/2021   Td  06/06/2008   Zoster Recombinat (Shingrix) 12/11/2018, 03/26/2019   Zoster, Live 01/15/2011    Past Medical History:  Diagnosis Date   AKI (acute kidney injury) ([place]) 01/09/2020   Allergy    seasonal   Anxiety    Arachnoiditis    Cataract    Depression    Elevated liver enzymes    Environmental allergies    GERD (gastroesophageal reflux disease)    H/O being hospitalized 03/2012   x1 week for nausea   History of chicken pox    Hyperlipidemia    Hypertension    Migraine with typical aura    resolved years ago   Nausea    chronic nausea    Neuromuscular disorder ([place])     Tobacco History: Social History   Tobacco Use  Smoking Status Former   Packs/day: 0.25   Years: 8.00   Total pack years: 2.00   Types: Cigarettes   Quit date: 08/09/1986   Years since quitting: 35.7  Smokeless Tobacco Never   Counseling given: Not Answered   Outpatient Medications Prior to Visit  Medication Sig Dispense Refill   amLODipine (NORVASC) 5 MG tablet TAKE 1 TABLET (5 MG TOTAL) BY MOUTH DAILY. 90 tablet 1   clonazePAM (KLONOPIN) 1 MG tablet TAKE 1 TABLET BY MOUTH THREE TIMES A DAY AS NEEDED FOR ANXIETY 90 tablet 0   escitalopram (LEXAPRO) 10 MG tablet Take 1 tablet (10 mg total) by mouth daily.     fluticasone (FLONASE) 50 MCG/ACT nasal spray Place 2 sprays into both nostrils daily. 16 g 6   gabapentin (NEURONTIN) 100 MG capsule Take 2 capsules (200 mg total) by mouth 3 (three) times daily. 270 capsule 0   mirtazapine (REMERON) 45 MG tablet Take 45 mg by mouth at bedtime.      montelukast (SINGULAIR) 10 MG tablet TAKE 1 TABLET BY MOUTH EVERYDAY AT BEDTIME 90 tablet 1   Multiple Vitamin (MULTIVITAMIN) capsule Take 1 capsule by mouth daily. Dynamic brain     omeprazole (PRILOSEC) 40 MG capsule TAKE 1 CAPSULE (40 MG TOTAL) BY MOUTH IN THE MORNING AND AT BEDTIME. 180 capsule 1   ondansetron (ZOFRAN-ODT) 4 MG disintegrating tablet TAKE 1 TABLET BY MOUTH EVERY 8 HOURS AS NEEDED FOR NAUSEA AND  VOMITING 30 tablet 0   traZODone (DESYREL) 50 MG tablet Take 50-150 mg by mouth at bedtime as needed for sleep.      albuterol (VENTOLIN HFA) 108 (90 Base) MCG/ACT inhaler Inhale 1-2 puffs into the lungs every 6 (six) hours as needed. 8 g 2   amoxicillin-clavulanate (AUGMENTIN) 875-125 MG tablet Take 1 tablet by mouth 2 (two) times daily. 14 tablet 0   benzonatate (TESSALON) 200 MG capsule Take 1 capsule (200 mg total) by mouth 3 (three) times daily as needed. 45 capsule 3   predniSONE (DELTASONE) 10 MG tablet 4 tabs for 3 days, then 3 tabs for 3 days, 2 tabs for 3 days, then 1 tab for 3 days, then stop 30 tablet 0   No facility-administered medications prior to visit.     Review of Systems:   Constitutional:   No  weight loss, night sweats,  Fevers, chills,  +fatigue, or  lassitude.  HEENT:   No headaches,  Difficulty swallowing,  Tooth/dental problems, or  Sore throat,                No sneezing, itching, ear ache, nasal congestion, post  nasal drip,   CV:  No chest pain,  Orthopnea, PND, swelling in lower extremities, anasarca, dizziness, palpitations, syncope.   GI  No heartburn, indigestion, abdominal pain, nausea, vomiting, diarrhea, change in bowel habits, loss of appetite, bloody stools.   Resp: No shortness of breath with exertion or at rest.  No excess mucus, no productive cough,  No non-productive cough,  No coughing up of blood.  No change in color of mucus.  No wheezing.  No chest wall deformity  Skin: no rash or lesions.  GU: no dysuria, change in color of urine, no urgency or frequency.  No flank pain, no hematuria   MS:  No joint pain or swelling.  No decreased range of motion.  No back pain.    Physical Exam  BP 114/78 (BP Location: Left Arm, Patient Position: Sitting, Cuff Size: Large)   Pulse 73   Temp 98.5 F (36.9 C) (Oral)   Ht 5' 1.5" (1.562 m)   Wt 170 lb 6.4 oz (77.3 kg)   LMP 03/29/1993 Comment: 12/25/13 Pt states last cycle has been about 20 yrs ago.   SpO2 93%   BMI 31.68 kg/m   GEN: A/Ox3; pleasant , NAD, well nourished    HEENT:  [place]/AT,   NOSE-clear, THROAT-clear, no lesions, no postnasal drip or exudate noted.   NECK:  Supple w/ fair ROM; no JVD; normal carotid impulses w/o bruits; no thyromegaly or nodules palpated; no lymphadenopathy.    RESP  Clear  P & A; w/o, wheezes/ rales/ or rhonchi. no accessory muscle use, no dullness to percussion  CARD:  RRR, no m/r/g, no peripheral edema, pulses intact, no cyanosis or clubbing.  GI:   Soft & nt; nml bowel sounds; no organomegaly or masses detected.   Musco: Warm bil, no deformities or joint swelling noted.   Neuro: alert, no focal deficits noted.    Skin: Warm, no lesions or rashes    Lab Results:  CBC    Component Value Date/Time   WBC 8.7 03/11/2022 1124   RBC 4.44 03/11/2022 1124   HGB 13.5 03/11/2022 1124   HCT 40.0 03/11/2022 1124   PLT 155.0 03/11/2022 1124   MCV 90.0 03/11/2022 1124   MCH 29.8 01/18/2020 1129   MCHC 33.8 03/11/2022 1124   RDW 14.4 03/11/2022 1124   LYMPHSABS 2.2 03/11/2022 1124   MONOABS 0.6 03/11/2022 1124   EOSABS 0.5 03/11/2022 1124   BASOSABS 0.1 03/11/2022 1124    BMET    Component Value Date/Time   NA 139 01/06/2022 1110   K 4.0 01/06/2022 1110   CL 106 01/06/2022 1110   CO2 25 01/06/2022 1110   GLUCOSE 86 01/06/2022 1110   BUN 19 01/06/2022 1110   CREATININE 0.98 01/06/2022 1110   CREATININE 0.93 02/28/2020 1254   CALCIUM 9.1 01/06/2022 1110   GFRNONAA 63 02/28/2020 1254   GFRAA 73 02/28/2020 1254    BNP No results found for: "BNP"  ProBNP No results found for: "PROBNP"  Imaging: DG Chest 2 View  Result Date: 04/13/2022 CLINICAL DATA:  Pneumonia EXAM: CHEST - 2 VIEW COMPARISON:  Chest x-ray dated 03/03/2022. Chest CT dated 03/09/2022. FINDINGS: Heart size and mediastinal contours are stable. Persistent platelike opacities at the RIGHT lung base, stable. No new lung findings. No pleural effusion or pneumothorax is  seen. No acute-appearing osseous abnormality. IMPRESSION: 1. Stable platelike opacities at the RIGHT lung base, most likely scarring or atelectasis. 2. No new lung findings. 3. The more subtle findings  on earlier chest CT (nodular airspace disease in the RIGHT lower lobe, subtle ground-glass nodules in the RIGHT lower lobe and subtle ill-defined nodules in the LEFT upper lobe) are likely below the limits of visualization by chest x-ray. As recommended on the earlier chest CT report, recommend follow-up chest CT in 2-4 weeks for direct comparison with the findings on the earlier chest CT of 03/09/2022. Electronically Signed   By: Franki Cabot M.D.   On: 04/13/2022 09:08   DG SWALLOW FUNC OP MEDICARE SPEECH PATH  Result Date: 03/30/2022 Table formatting from the original result was not included. Objective Swallowing Evaluation: Type of Study: MBS-Modified Barium Swallow Study  Patient Details Name: Pina Sirianni MRN: [id number] Date of Birth: 12/20/1950 Today's Date: 03/30/2022 Time: SLP Start Time (ACUTE ONLY): 47 -SLP Stop Time (ACUTE ONLY): 1150 SLP Time Calculation (min) (ACUTE ONLY): 20 min Past Medical History: Past Medical History: Diagnosis Date  AKI (acute kidney injury) ([place]) 01/09/2020  Allergy   seasonal  Anxiety   Arachnoiditis   Cataract   Depression   Elevated liver enzymes   Environmental allergies   GERD (gastroesophageal reflux disease)   H/O being hospitalized 03/2012  x1 week for nausea  History of chicken pox   Hyperlipidemia   Hypertension   Migraine with typical aura   resolved years ago  Nausea   chronic nausea   Neuromuscular disorder [facility])  Past Surgical History: Past Surgical History: Procedure Laterality Date  ABDOMINOPLASTY  07/11/09  EUS N/A 05/05/2012  Procedure: UPPER ENDOSCOPIC ULTRASOUND (EUS) LINEAR;  Surgeon: Beryle Beams, MD;  Location: [facility];  Service: Endoscopy;  Laterality: N/A;  LAPAROSCOPIC CHOLECYSTECTOMY  2010  NASAL SINUS SURGERY    "Has had 2-3 surgeries over the  years"  UMBILICAL HERNIA REPAIR N/A 10/06/2020  Procedure: UMBILICAL HERNIA REPAIR;  Surgeon: Coralie Keens, MD;  Location: [facility];  Service: General;  Laterality: N/A;  LMA ANESTHESIA HPI: Dao Mearns is a 73 y.o. female who presented to this OP MBS on recommendation following an acute office visit to [facility]. At that visit, she c/o ongoing cough, congestion, general malaise and fatigue. She had been treated for bronchitis and PNA three months prior. CT chest completed 03/10/22 showed: masslike consolidation in the right lower lobe has decreased in volume, new area ew area of density peripheral to the dominant mass consolidation and new areas of groundglass nodules in the right middle lobe and right upper lobe.  Ms. Kulzer also has been having some severe GERD symptoms resulting in getting choked at times and times where she has reflux into throat, causing her to vomit. PMH: GERd, depression, anxiety, HLD, HTN, nausea, neuromuscular disorder. Pulmonology NP ordered MBS due to concern for possible aspiration.  No data recorded  Recommendations for follow up therapy are one component of a multi-disciplinary discharge planning process, led by the attending physician.  Recommendations may be updated based on patient status, additional functional criteria and insurance authorization. Assessment / Plan / Recommendation   03/30/2022  12:00 PM Clinical Impressions Clinical Impression Patient presents with a normal oral and pharyngeal phase of swallow with no aspiration of any of the tested barium consistencies: thin liquid,nectar thick liquid puree solid, regular solid, 27m barium tablet. During esophageal sweep, barium stasis (suspected to be from solid textures) observed at level of upper thoracic portion of esophagus. Ms. NVeracruzdid report globus sensation (points to throat) at same time as when barium stasis observed. She reported that she has to drink extra  sips of liquids to clear globus  sensation when eating. SLP recommending f/u from PCP and may benefit from further esophageal assessment. SLP Visit Diagnosis Dysphagia, pharyngoesophageal phase (R13.14) Impact on safety and function Other (comment)       03/30/2022  12:00 PM Diet Recommendations SLP Diet Recommendations Regular solids;Thin liquid Liquid Administration via Straw;Cup Medication Administration Whole meds with liquid Postural Changes Remain semi-upright after after feeds/meals (Comment)     03/30/2022  12:00 PM Other Recommendations Recommended Consults Consider esophageal assessment     03/30/2022  12:00 PM Oral Phase Oral Phase [facility]    03/30/2022  12:00 PM Pharyngeal Phase Pharyngeal Phase [facility]    03/30/2022  12:00 PM Cervical Esophageal Phase  Cervical Esophageal Phase Impaired Sonia Baller, MA, CCC-SLP Speech Therapy                   CLINICAL DATA:  Persistent cough, recent pneumonia. EXAM: MODIFIED BARIUM SWALLOW TECHNIQUE: Different consistencies of barium were administered orally to the patient by the Speech Pathologist. Imaging of the pharynx was performed in the lateral projection. Hayley Boisseau, PA-C was present in the fluoroscopy room during this study, which was supervised and interpreted by Fabiola Backer, MD. FLUOROSCOPY: Radiation Exposure Index (as provided by the fluoroscopic device): 12.72 mGy Kerma COMPARISON:  11/04/05 Esophagram. FINDINGS: Vestibular  Penetration:  None seen. Aspiration:  None seen. Other:  None. IMPRESSION: No apparent laryngeal penetration or tracheal aspiration. Please refer to the Speech Pathologists report for complete details and recommendations. Electronically Signed   By: Titus Dubin M.D.   On: 03/30/2022 12:41        Latest Ref Rng & Units 07/25/2019   12:41 PM  PFT Results  FVC-Pre L 2.64   FVC-Predicted Pre % 95   FVC-Post L 2.84   FVC-Predicted Post % 102   Pre FEV1/FVC % % 83   Post FEV1/FCV % % 85   FEV1-Pre L 2.18   FEV1-Predicted Pre % 103   FEV1-Post L 2.42   DLCO  uncorrected ml/min/mmHg 16.91   DLCO UNC% % 94   DLVA Predicted % 84   TLC L 5.19   TLC % Predicted % 110   RV % Predicted % 107     No results found for: "NITRICOXIDE"      Assessment & Plan:   Pneumonia Severe right-sided pneumonia with masslike consolidation in the right lower lobe with surrounding groundglass opacities.  CT chest in December did show decrease in consolidation with new areas of possible infectious versus inflammatory areas.  She was additional antibiotics.  Did check a modified barium swallow no severe aspiration but with felt at risk.  We discussed aspiration precautions.  She also has been seen by GI with gastritis noted.  Patient has been restarted on a PPI therapy. X-ray today is stable.  Recommend a CT chest in 2 to 3 weeks.  Clinically patient is better.  Doubt organizing pneumonia as sed rate was not very elevated and no new infiltrates are noted.  Will look further at CT in 2 to 3 weeks.  Plan  Patient Instructions  CT chest without contrast in 2-3  weeks   Continue on Omeprazole '40mg'$  Twice daily  before meals .  Aspiration precautions  GERD diet.  Sleep at incline - wedge pillow .  Follow up with GI as discussed.   Zyrtec '10mg'$  At bedtime As needed   Saline nasal spray Twice daily   Saline nasal gel. At bedtime  Albuterol inhaler As needed    Home sleep study as planned   Follow up with Dr [name] or Francisco Ostrovsky NP in 3-4 weeks and As needed   Please contact office for sooner follow up if symptoms do not improve or worsen or seek emergency care      Reactive airway disease Control for triggers. Albuterol as needed  Patient Instructions  CT chest without contrast in 2-3  weeks   Continue on Omeprazole '40mg'$  Twice daily  before meals .  Aspiration precautions  GERD diet.  Sleep at incline - wedge pillow .  Follow up with GI as discussed.   Zyrtec '10mg'$  At bedtime As needed   Saline nasal spray Twice daily   Saline nasal gel. At bedtime    Albuterol inhaler As needed    Home sleep study as planned   Follow up with Dr [name] or Zayyan Mullen NP in 3-4 weeks and As needed   Please contact office for sooner follow up if symptoms do not improve or worsen or seek emergency care      Snoring Home sleep study is pending  GERD (gastroesophageal reflux disease) Continue on PPI therapy.  Follow-up with GI as discussed  Plan  Patient Instructions  CT chest without contrast in 2-3  weeks   Continue on Omeprazole '40mg'$  Twice daily  before meals .  Aspiration precautions  GERD diet.  Sleep at incline - wedge pillow .  Follow up with GI as discussed.   Zyrtec '10mg'$  At bedtime As needed   Saline nasal spray Twice daily   Saline nasal gel. At bedtime   Albuterol inhaler As needed    Home sleep study as planned   Follow up with Dr [name] or Dotty Gonzalo NP in 3-4 weeks and As needed   Please contact office for sooner follow up if symptoms do not improve or worsen or seek emergency care   '     Jaegar Croft, NP 04/13/2022

## 2022-04-13 NOTE — Assessment & Plan Note (Signed)
Control for triggers. Albuterol as needed  Patient Instructions  CT chest without contrast in 2-3  weeks   Continue on Omeprazole '40mg'$  Twice daily  before meals .  Aspiration precautions  GERD diet.  Sleep at incline - wedge pillow .  Follow up with GI as discussed.   Zyrtec '10mg'$  At bedtime As needed   Saline nasal spray Twice daily   Saline nasal gel. At bedtime   Albuterol inhaler As needed    Home sleep study as planned   Follow up with Dr [name] or Larraine Argo NP in 3-4 weeks and As needed   Please contact office for sooner follow up if symptoms do not improve or worsen or seek emergency care

## 2022-04-13 NOTE — Patient Instructions (Addendum)
CT chest without contrast in 2-3  weeks   Continue on Omeprazole '40mg'$  Twice daily  before meals .  Aspiration precautions  GERD diet.  Sleep at incline - wedge pillow .  Follow up with GI as discussed.   Zyrtec '10mg'$  At bedtime As needed   Saline nasal spray Twice daily   Saline nasal gel. At bedtime   Albuterol inhaler As needed    Home sleep study as planned   Follow up with Dr [name] or Subrina Vecchiarelli NP in 3-4 weeks and As needed   Please contact office for sooner follow up if symptoms do not improve or worsen or seek emergency care

## 2022-04-13 NOTE — Assessment & Plan Note (Signed)
Continue on PPI therapy.  Follow-up with GI as discussed  Plan  Patient Instructions  CT chest without contrast in 2-3  weeks   Continue on Omeprazole '40mg'$  Twice daily  before meals .  Aspiration precautions  GERD diet.  Sleep at incline - wedge pillow .  Follow up with GI as discussed.   Zyrtec '10mg'$  At bedtime As needed   Saline nasal spray Twice daily   Saline nasal gel. At bedtime   Albuterol inhaler As needed    Home sleep study as planned   Follow up with Dr [name] or Dorrian Doggett NP in 3-4 weeks and As needed   Please contact office for sooner follow up if symptoms do not improve or worsen or seek emergency care   '

## 2022-04-13 NOTE — Assessment & Plan Note (Signed)
Severe right-sided pneumonia with masslike consolidation in the right lower lobe with surrounding groundglass opacities.  CT chest in December did show decrease in consolidation with new areas of possible infectious versus inflammatory areas.  She was additional antibiotics.  Did check a modified barium swallow no severe aspiration but with felt at risk.  We discussed aspiration precautions.  She also has been seen by GI with gastritis noted.  Patient has been restarted on a PPI therapy. X-ray today is stable.  Recommend a CT chest in 2 to 3 weeks.  Clinically patient is better.  Doubt organizing pneumonia as sed rate was not very elevated and no new infiltrates are noted.  Will look further at CT in 2 to 3 weeks.  Plan  Patient Instructions  CT chest without contrast in 2-3  weeks   Continue on Omeprazole '40mg'$  Twice daily  before meals .  Aspiration precautions  GERD diet.  Sleep at incline - wedge pillow .  Follow up with GI as discussed.   Zyrtec '10mg'$  At bedtime As needed   Saline nasal spray Twice daily   Saline nasal gel. At bedtime   Albuterol inhaler As needed    Home sleep study as planned   Follow up with Dr [name] or Haydee Jabbour NP in 3-4 weeks and As needed   Please contact office for sooner follow up if symptoms do not improve or worsen or seek emergency care

## 2022-04-16 DIAGNOSIS — G4733 Obstructive sleep apnea (adult) (pediatric): Secondary | ICD-10-CM

## 2022-04-23 ENCOUNTER — Ambulatory Visit (INDEPENDENT_AMBULATORY_CARE_PROVIDER_SITE_OTHER): Payer: Medicare Other | Admitting: *Deleted

## 2022-04-23 DIAGNOSIS — Z0000 Encounter for general adult medical examination without abnormal findings: Secondary | ICD-10-CM | POA: Diagnosis not present

## 2022-04-23 NOTE — Patient Instructions (Signed)
[name] , Thank you for taking time to come for your Medicare Wellness Visit. I appreciate your ongoing commitment to your health goals. Please review the following plan we discussed and let me know if I can assist you in the future.   These are the goals we discussed:  Goals       Continue involvement in social activities      Would like to feel 100% (pt-stated)        This is a list of the screening recommended for you and due dates:  Health Maintenance  Topic Date Due   DTaP/Tdap/Td vaccine (2 - Tdap) 06/07/2018   COVID-19 Vaccine (6 - 2023-24 season) 02/13/2022   Mammogram  02/24/2023   Medicare Annual Wellness Visit  04/24/2023   Colon Cancer Screening  05/27/2025   Pneumonia Vaccine  Completed   Flu Shot  Completed   DEXA scan (bone density measurement)  Completed   Hepatitis C Screening: USPSTF Recommendation to screen - Ages 18-79 yo.  Completed   Zoster (Shingles) Vaccine  Completed   HPV Vaccine  Aged Out     Next appointment: Follow up in one year for your annual wellness visit.   Preventive Care 37 Years and Older, Female Preventive care refers to lifestyle choices and visits with your health care provider that can promote health and wellness. What does preventive care include? A yearly physical exam. This is also called an annual well check. Dental exams once or twice a year. Routine eye exams. Ask your health care provider how often you should have your eyes checked. Personal lifestyle choices, including: Daily care of your teeth and gums. Regular physical activity. Eating a healthy diet. Avoiding tobacco and drug use. Limiting alcohol use. Practicing safe sex. Taking low-dose aspirin every day. Taking vitamin and mineral supplements as recommended by your health care provider. What happens during an annual well check? The services and screenings done by your health care provider during your annual well check will depend on your age, overall health,  lifestyle risk factors, and family history of disease. Counseling  Your health care provider may ask you questions about your: Alcohol use. Tobacco use. Drug use. Emotional well-being. Home and relationship well-being. Sexual activity. Eating habits. History of falls. Memory and ability to understand (cognition). Work and work Statistician. Reproductive health. Screening  You may have the following tests or measurements: Height, weight, and BMI. Blood pressure. Lipid and cholesterol levels. These may be checked every 5 years, or more frequently if you are over 70 years old. Skin check. Lung cancer screening. You may have this screening every year starting at age 55 if you have a 30-pack-year history of smoking and currently smoke or have quit within the past 15 years. Fecal occult blood test (FOBT) of the stool. You may have this test every year starting at age 39. Flexible sigmoidoscopy or colonoscopy. You may have a sigmoidoscopy every 5 years or a colonoscopy every 10 years starting at age 36. Hepatitis C blood test. Hepatitis B blood test. Sexually transmitted disease (STD) testing. Diabetes screening. This is done by checking your blood sugar (glucose) after you have not eaten for a while (fasting). You may have this done every 1-3 years. Bone density scan. This is done to screen for osteoporosis. You may have this done starting at age 20. Mammogram. This may be done every 1-2 years. Talk to your health care provider about how often you should have regular mammograms. Talk with your health care  provider about your test results, treatment options, and if necessary, the need for more tests. Vaccines  Your health care provider may recommend certain vaccines, such as: Influenza vaccine. This is recommended every year. Tetanus, diphtheria, and acellular pertussis (Tdap, Td) vaccine. You may need a Td booster every 10 years. Zoster vaccine. You may need this after age  82. Pneumococcal 13-valent conjugate (PCV13) vaccine. One dose is recommended after age 79. Pneumococcal polysaccharide (PPSV23) vaccine. One dose is recommended after age 51. Talk to your health care provider about which screenings and vaccines you need and how often you need them. This information is not intended to replace advice given to you by your health care provider. Make sure you discuss any questions you have with your health care provider. Document Released: 04/11/2015 Document Revised: 12/03/2015 Document Reviewed: 01/14/2015 Elsevier Interactive Patient Education  2017 [place] Prevention in the Home Falls can cause injuries. They can happen to people of all ages. There are many things you can do to make your home safe and to help prevent falls. What can I do on the outside of my home? Regularly fix the edges of walkways and driveways and fix any cracks. Remove anything that might make you trip as you walk through a door, such as a raised step or threshold. Trim any bushes or trees on the path to your home. Use bright outdoor lighting. Clear any walking paths of anything that might make someone trip, such as rocks or tools. Regularly check to see if handrails are loose or broken. Make sure that both sides of any steps have handrails. Any raised decks and porches should have guardrails on the edges. Have any leaves, snow, or ice cleared regularly. Use sand or salt on walking paths during winter. Clean up any spills in your garage right away. This includes oil or grease spills. What can I do in the bathroom? Use night lights. Install grab bars by the toilet and in the tub and shower. Do not use towel bars as grab bars. Use non-skid mats or decals in the tub or shower. If you need to sit down in the shower, use a plastic, non-slip stool. Keep the floor dry. Clean up any water that spills on the floor as soon as it happens. Remove soap buildup in the tub or shower  regularly. Attach bath mats securely with double-sided non-slip rug tape. Do not have throw rugs and other things on the floor that can make you trip. What can I do in the bedroom? Use night lights. Make sure that you have a light by your bed that is easy to reach. Do not use any sheets or blankets that are too big for your bed. They should not hang down onto the floor. Have a firm chair that has side arms. You can use this for support while you get dressed. Do not have throw rugs and other things on the floor that can make you trip. What can I do in the kitchen? Clean up any spills right away. Avoid walking on wet floors. Keep items that you use a lot in easy-to-reach places. If you need to reach something above you, use a strong step stool that has a grab bar. Keep electrical cords out of the way. Do not use floor polish or wax that makes floors slippery. If you must use wax, use non-skid floor wax. Do not have throw rugs and other things on the floor that can make you trip. What can I  do with my stairs? Do not leave any items on the stairs. Make sure that there are handrails on both sides of the stairs and use them. Fix handrails that are broken or loose. Make sure that handrails are as long as the stairways. Check any carpeting to make sure that it is firmly attached to the stairs. Fix any carpet that is loose or worn. Avoid having throw rugs at the top or bottom of the stairs. If you do have throw rugs, attach them to the floor with carpet tape. Make sure that you have a light switch at the top of the stairs and the bottom of the stairs. If you do not have them, ask someone to add them for you. What else can I do to help prevent falls? Wear shoes that: Do not have high heels. Have rubber bottoms. Are comfortable and fit you well. Are closed at the toe. Do not wear sandals. If you use a stepladder: Make sure that it is fully opened. Do not climb a closed stepladder. Make sure that  both sides of the stepladder are locked into place. Ask someone to hold it for you, if possible. Clearly mark and make sure that you can see: Any grab bars or handrails. First and last steps. Where the edge of each step is. Use tools that help you move around (mobility aids) if they are needed. These include: Canes. Walkers. Scooters. Crutches. Turn on the lights when you go into a dark area. Replace any light bulbs as soon as they burn out. Set up your furniture so you have a clear path. Avoid moving your furniture around. If any of your floors are uneven, fix them. If there are any pets around you, be aware of where they are. Review your medicines with your doctor. Some medicines can make you feel dizzy. This can increase your chance of falling. Ask your doctor what other things that you can do to help prevent falls. This information is not intended to replace advice given to you by your health care provider. Make sure you discuss any questions you have with your health care provider. Document Released: 01/09/2009 Document Revised: 08/21/2015 Document Reviewed: 04/19/2014 Elsevier Interactive Patient Education  2017 [employer].

## 2022-04-23 NOTE — Progress Notes (Signed)
Subjective:  Pt completes SDOH, fall screening, and ADLs during echeck-in on 04/16/22.  Answers verified with pt.    [name] is a 72 y.o. female who presents for Medicare Annual (Subsequent) preventive examination.  I connected with  Kambree Krauss on 04/23/22 by a audio enabled telemedicine application and verified that I am speaking with the correct person using two identifiers.  Patient Location: Home  Provider Location: Office/Clinic  I discussed the limitations of evaluation and management by telemedicine. The patient expressed understanding and agreed to proceed.   Review of Systems    Defer to PCP Cardiac Risk Factors include: advanced age (>39mn, >>71women);hypertension;obesity (BMI >30kg/m2)     Objective:    There were no vitals filed for this visit. There is no height or weight on file to calculate BMI.     04/23/2022    1:04 PM 04/06/2021   11:47 AM 10/06/2020   10:50 AM 10/01/2020   11:14 AM 01/09/2020    2:41 AM 01/08/2020    6:05 PM 01/08/2020    5:43 PM  Advanced Directives  Does Patient Have a Medical Advance Directive? Yes Yes Yes Yes No No No  Type of [place]amed[place] will Healthcare Power of [place] will     Does patient want to make changes to medical advance directive?   No - Patient declined No - Patient declined     Copy of [place] Chart? No - copy requested No - copy requested       Would patient like information on creating a medical advance directive?     No - Patient declined      Current Medications (verified) Outpatient Encounter Medications as of 04/23/2022  Medication Sig   amLODipine (NORVASC) 5 MG tablet TAKE 1 TABLET (5 MG TOTAL) BY MOUTH DAILY.   clonazePAM (KLONOPIN) 1 MG tablet TAKE 1 TABLET BY MOUTH THREE TIMES A DAY AS NEEDED FOR ANXIETY   escitalopram (LEXAPRO) 10 MG tablet Take 1 tablet (10 mg total) by mouth daily.   fluticasone  (FLONASE) 50 MCG/ACT nasal spray Place 2 sprays into both nostrils daily.   gabapentin (NEURONTIN) 100 MG capsule Take 2 capsules (200 mg total) by mouth 3 (three) times daily.   mirtazapine (REMERON) 45 MG tablet Take 45 mg by mouth at bedtime.    montelukast (SINGULAIR) 10 MG tablet TAKE 1 TABLET BY MOUTH EVERYDAY AT BEDTIME   Multiple Vitamin (MULTIVITAMIN) capsule Take 1 capsule by mouth daily. Dynamic brain   omeprazole (PRILOSEC) 40 MG capsule TAKE 1 CAPSULE (40 MG TOTAL) BY MOUTH IN THE MORNING AND AT BEDTIME.   ondansetron (ZOFRAN-ODT) 4 MG disintegrating tablet TAKE 1 TABLET BY MOUTH EVERY 8 HOURS AS NEEDED FOR NAUSEA AND VOMITING   traZODone (DESYREL) 50 MG tablet Take 50-150 mg by mouth at bedtime as needed for sleep.    No facility-administered encounter medications on file as of 04/23/2022.    Allergies (verified) Compazine [prochlorperazine], Morphine and related, and Trintellix [vortioxetine]   History: Past Medical History:  Diagnosis Date   [place]) [place] 01/[place]   Allergy    seasonal   Anxiety    Arachnoiditis    Cataract    Depression    Elevated liver enzymes    Environmental allergies    GERD (gastroesophageal reflux disease)    H/O being hospitalized 03/2012   x1 week for nausea   History of chicken pox  Hyperlipidemia    Hypertension    Migraine with typical aura    resolved years ago   Nausea    chronic nausea    Neuromuscular disorder [facility])    Past Surgical History:  Procedure Laterality Date   ABDOMINOPLASTY  07/11/09   EUS N/A 05/05/2012   Procedure: UPPER ENDOSCOPIC ULTRASOUND (EUS) LINEAR;  Surgeon: Beryle Beams, MD;  Location: [facility];  Service: Endoscopy;  Laterality: N/A;   LAPAROSCOPIC CHOLECYSTECTOMY  2010   NASAL SINUS SURGERY     "Has had 2-3 surgeries over the years"   UMBILICAL HERNIA REPAIR N/A 10/06/2020   Procedure: UMBILICAL HERNIA REPAIR;  Surgeon: Coralie Keens, MD;  Location: [facility];  Service: General;  Laterality: N/A;  LMA ANESTHESIA   Family History  Problem Relation Age of Onset   Alzheimer's disease Mother    Hypertension Mother    Migraines Mother    Hypertension Father    Other Father        ?carotid artery aneurysm   Cancer Brother        mouth and throat   Colon cancer Neg Hx    Social History   Socioeconomic History   Marital status: Married    Spouse name: Not on file   Number of children: Not on file   Years of education: Not on file   Highest education level: Not on file  Occupational History   Not on file  Tobacco Use   Smoking status: Former    Packs/day: 0.25    Years: 8.00    Total pack years: 2.00    Types: Cigarettes    Quit date: 08/09/1986    Years since quitting: 35.7   Smokeless tobacco: Never  Vaping Use   Vaping Use: Never used  Substance and Sexual Activity   Alcohol use: No   Drug use: No   Sexual activity: Not Currently    Partners: Male    Birth control/protection: Other-see comments, Post-menopausal    Comment: husband had vasectomy.   Other Topics Concern   Not on file  Social History Narrative   2 step daughters- 80 and 76   She worked as an Web designer.   Enjoys puzzles, counted cross stitch.   Completed 1 year of college   4 cats   Social Determinants of Health   Financial Resource Strain: Low Risk  (04/16/2022)   Overall Financial Resource Strain (CARDIA)    Difficulty of Paying Living Expenses: Not hard at all  Food Insecurity: No Food Insecurity (04/16/2022)   Hunger Vital Sign    Worried About Running Out of Food in the Last Year: Never true    Ran Out of Food in the Last Year: Never true  Transportation Needs: No Transportation Needs (04/16/2022)   PRAPARE - Hydrologist (Medical): No    Lack of Transportation (Non-Medical): No  Physical Activity: Inactive (04/16/2022)   Exercise Vital Sign    Days of Exercise per Week: 0 days    Minutes of  Exercise per Session: 0 min  Stress: Unknown (04/16/2022)   [place]    Feeling of Stress : Patient refused  Social Connections: Unknown (04/16/2022)   Social Connection and Isolation Panel [NHANES]    Frequency of Communication with Friends and Family: More than three times a week    Frequency of Social Gatherings with Friends and Family: Patient refused    Attends  Religious Services: Not on file    Active Member of Clubs or Organizations: No    Attends Archivist Meetings: Never    Marital Status: Married    Tobacco Counseling Counseling given: Not Answered   Clinical Intake:  Pre-visit preparation completed: Yes  Pain : No/denies pain     Diabetes: No  How often do you need to have someone help you when you read instructions, pamphlets, or other written materials from your doctor or pharmacy?: 2 - Rarely   Activities of Daily Living    04/16/2022    8:57 AM  In your present state of health, do you have any difficulty performing the following activities:  Hearing? 0  Vision? 0  Difficulty concentrating or making decisions? 0  Walking or climbing stairs? 1  Dressing or bathing? 0  Doing errands, shopping? 0  Preparing Food and eating ? N  Using the Toilet? N  In the past six months, have you accidently leaked urine? N  Do you have problems with loss of bowel control? N  Managing your Medications? N  Managing your Finances? N  Housekeeping or managing your Housekeeping? Y    Patient Care Team: [name], NP as PCP - General (Internal Medicine) Renee Pain, MD (Inactive) as Referring Physician (Plastic Surgery) Cleta Alberts, MD as Referring Physician (Neurology) Juanita Craver, MD as Consulting Physician (Gastroenterology)  Indicate any recent Medical Services you may have received from other than [facility] providers in the past year (date may be approximate).     Assessment:    This is a routine wellness examination for Ashlynd.  Hearing/Vision screen No results found.  Dietary issues and exercise activities discussed: Current Exercise Habits: The patient does not participate in regular exercise at present, Exercise limited by: orthopedic condition(s);Other - see comments   Goals Addressed   None    Depression Screen    04/23/2022    1:04 PM 04/06/2021   11:51 AM 08/05/2020    9:36 AM 09/20/2019   11:50 AM 06/27/2018    9:51 AM 11/11/2017    2:29 PM 09/09/2017   11:19 AM  PHQ 2/9 Scores  PHQ - 2 Score 0 0 '2 1 2 2 1  '$ PHQ- 9 Score   '8  10 12     '$ Fall Risk    04/16/2022    8:57 AM 04/06/2021   11:49 AM 08/05/2020    9:10 AM 09/20/2019   12:02 PM 02/16/2019    6:30 PM  [place] in the past year? 0 0 1 0 1  Comment     Emmi Telephone Survey: data to providers prior to load  Number falls in past yr: 0 0 0 0 1  Comment     Emmi Telephone Survey Actual Response = 2  Injury with Fall? 0 0 1 0 0  Comment   bruising    Risk for fall due to : No Fall Risks      Follow up Falls evaluation completed Falls prevention discussed  Education provided;Falls prevention discussed     FALL RISK PREVENTION PERTAINING TO THE HOME:  Any stairs in or around the home? Yes  If so, are there any without handrails? No  Home free of loose throw rugs in walkways, pet beds, electrical cords, etc? Yes  Adequate lighting in your home to reduce risk of falls? Yes   ASSISTIVE DEVICES UTILIZED TO PREVENT FALLS:  Life alert? No  Use of a cane,  walker or w/c? No  Grab bars in the bathroom? Yes  Shower chair or bench in shower? No  Elevated toilet seat or a handicapped toilet? No   TIMED UP AND GO:  Was the test performed?  No, audio visit .   Cognitive Function:    09/09/2017   11:37 AM  MMSE - Mini Mental State Exam  Orientation to time 5  Orientation to Place 5  Registration 3  Attention/ Calculation 5  Recall 3  Language- name 2 objects 2  Language- repeat  1  Language- follow 3 step command 3  Language- read & follow direction 1  Write a sentence 1  Copy design 1  Total score 30        04/23/2022    1:09 PM  6CIT Screen  What Year? 0 points  What month? 0 points  What time? 0 points  Count back from 20 0 points  Months in reverse 0 points  Repeat phrase 0 points  Total Score 0 points    Immunizations Immunization History  Administered Date(s) Administered   Fluad Quad(high Dose 65+) 01/01/2021, 12/19/2021   Influenza, High Dose Seasonal PF 01/09/2016, 12/15/2016, 12/14/2017, 12/01/2018   Influenza,inj,Quad PF,6+ Mos 12/25/2013, 12/13/2014   Influenza-Unspecified 12/05/2019, 12/19/2021   Moderna Sars-Covid-2 Vaccination 12/19/2021   PFIZER(Purple Top)SARS-COV-2 Vaccination 05/03/2019, 05/24/2019, 12/05/2019   Pfizer Covid-19 Vaccine Bivalent Booster 86yr & up 01/17/2021   Pneumococcal Conjugate-13 02/17/2018   Pneumococcal Polysaccharide-23 12/13/2014, 03/02/2022   RSV,unspecified 12/19/2021   Td 06/06/2008   Zoster Recombinat (Shingrix) 12/11/2018, 03/26/2019   Zoster, Live 01/15/2011    TDAP status: Due, Education has been provided regarding the importance of this vaccine. Advised may receive this vaccine at local pharmacy or Health Dept. Aware to provide a copy of the vaccination record if obtained from local pharmacy or Health Dept. Verbalized acceptance and understanding.  Flu Vaccine status: Up to date  Pneumococcal vaccine status: Up to date  Covid-19 vaccine status: Information provided on how to obtain vaccines.   Qualifies for Shingles Vaccine? Yes   Zostavax completed Yes   Shingrix Completed?: Yes  Screening Tests Health Maintenance  Topic Date Due   DTaP/Tdap/Td (2 - Tdap) 06/07/2018   COVID-19 Vaccine (6 - 2023-24 season) 02/13/2022   Medicare Annual Wellness (AWV)  04/06/2022   MAMMOGRAM  02/24/2023   COLONOSCOPY (Pts [phone] coverage will need to be confirmed)  05/27/2025   Pneumonia  Vaccine 6590Years old  Completed   INFLUENZA VACCINE  Completed   DEXA SCAN  Completed   Hepatitis C Screening  Completed   Zoster Vaccines- Shingrix  Completed   HPV VACCINES  Aged Out    Health Maintenance  Health Maintenance Due  Topic Date Due   DTaP/Tdap/Td (2 - Tdap) 06/07/2018   COVID-19 Vaccine (6 - 2023-24 season) 02/13/2022   Medicare Annual Wellness (AWV)  04/06/2022    Colorectal cancer screening: Type of screening: Colonoscopy. Completed 05/28/15. Repeat every 10 years  Mammogram status: Completed 02/23/21. Repeat every year  Bone Density status: Completed 05/05/21. Results reflect: Bone density results: OSTEOPENIA. Repeat every 2 years.  Lung Cancer Screening: (Low Dose CT Chest recommended if Age 72-80ears, 30 pack-year currently smoking OR have quit w/in 15years.) does not qualify.   Additional Screening:  Hepatitis C Screening: does qualify; Completed 06/13/15  Vision Screening: Recommended annual ophthalmology exams for early detection of glaucoma and other disorders of the eye. Is the patient up to date with their annual eye exam?  Yes  Who is the provider or what is the name of the office in which the patient attends annual eye exams? [facility] If pt is not established with a provider, would they like to be referred to a provider to establish care? No .   Dental Screening: Recommended annual dental exams for proper oral hygiene  Community Resource Referral / Chronic Care Management: CRR required this visit?  No   CCM required this visit?  No      Plan:     I have personally reviewed and noted the following in the patient's chart:   Medical and social history Use of alcohol, tobacco or illicit drugs  Current medications and supplements including opioid prescriptions. Patient is not currently taking opioid prescriptions. Functional ability and status Nutritional status Physical activity Advanced directives List of other  physicians Hospitalizations, surgeries, and ER visits in previous 12 months Vitals Screenings to include cognitive, depression, and falls Referrals and appointments  In addition, I have reviewed and discussed with patient certain preventive protocols, quality metrics, and best practice recommendations. A written personalized care plan for preventive services as well as general preventive health recommendations were provided to patient.   Due to this being a telephonic visit, the after visit summary with patients personalized plan was offered to patient via mail or my-chart. Patient would like to access on my-chart.  Beatris Ship, [place]   04/23/2022   Nurse Notes: None

## 2022-04-23 NOTE — Progress Notes (Signed)
I have reviewed and agree with [facility] documentation.  Kathlene November, MD

## 2022-04-26 NOTE — Progress Notes (Signed)
Patient has scheduled f/u with Rexene Edison NP on 05/10/22 at 11 am.  Nothing further needed.

## 2022-05-04 ENCOUNTER — Ambulatory Visit (HOSPITAL_BASED_OUTPATIENT_CLINIC_OR_DEPARTMENT_OTHER)
Admission: RE | Admit: 2022-05-04 | Discharge: 2022-05-04 | Disposition: A | Discharge status: Home / Self Care | Payer: Medicare Other | Source: Ambulatory Visit | Attending: Family | Admitting: Family

## 2022-05-04 DIAGNOSIS — R918 Other nonspecific abnormal finding of lung field: Secondary | ICD-10-CM | POA: Diagnosis not present

## 2022-05-10 ENCOUNTER — Ambulatory Visit: Payer: Medicare Other | Admitting: Adult Health

## 2022-05-10 ENCOUNTER — Encounter: Payer: Self-pay | Admitting: Adult Health

## 2022-05-10 VITALS — BP 110/60 | HR 76 | Temp 98.1°F | Ht 61.5 in | Wt 171.4 lb

## 2022-05-10 DIAGNOSIS — R918 Other nonspecific abnormal finding of lung field: Secondary | ICD-10-CM | POA: Insufficient documentation

## 2022-05-10 DIAGNOSIS — J189 Pneumonia, unspecified organism: Secondary | ICD-10-CM | POA: Diagnosis not present

## 2022-05-10 DIAGNOSIS — G4733 Obstructive sleep apnea (adult) (pediatric): Secondary | ICD-10-CM | POA: Insufficient documentation

## 2022-05-10 DIAGNOSIS — J309 Allergic rhinitis, unspecified: Secondary | ICD-10-CM

## 2022-05-10 NOTE — Patient Instructions (Addendum)
CT chest without contrast in 6 months   Continue on Omeprazole 46m Twice daily  before meals .  Aspiration precautions  GERD diet.  Sleep at incline - wedge pillow .  Follow up with GI as discussed.   Begin Zyrtec 153mAt bedtime.  Saline nasal spray Twice daily   Saline nasal gel. At bedtime   Begin Flonase 2 puffs daily  Albuterol inhaler As needed    Set up for CPAP titration study .   Follow up with Dr MaVaughan Brownerr Nillie Bartolotta NP in 3 months and As needed   Please contact office for sooner follow up if symptoms do not improve or worsen or seek emergency care

## 2022-05-10 NOTE — Assessment & Plan Note (Signed)
Severe sleep apnea with nocturnal hypoxemia.  Set up for CPAP titration study - discussed how weight can impact sleep and risk for sleep disordered breathing - discussed options to assist with weight loss: combination of diet modification, cardiovascular and strength training exercises   - had an extensive discussion regarding the adverse health consequences related to untreated sleep disordered breathing - specifically discussed the risks for hypertension, coronary artery disease, cardiac dysrhythmias, cerebrovascular disease, and diabetes - lifestyle modification discussed   - discussed how sleep disruption can increase risk of accidents, particularly when driving - safe driving practices were discussed

## 2022-05-10 NOTE — Assessment & Plan Note (Signed)
Mild flare.  Add in Zyrtec at bedtime.  Flonase as needed  Plan  PLAN:  Patient Instructions  CT chest without contrast in 6 months   Continue on Omeprazole 55m Twice daily  before meals .  Aspiration precautions  GERD diet.  Sleep at incline - wedge pillow .  Follow up with GI as discussed.   Begin Zyrtec 160mAt bedtime.  Saline nasal spray Twice daily   Saline nasal gel. At bedtime   Begin Flonase 2 puffs daily  Albuterol inhaler As needed    Set up for CPAP titration study .   Follow up with Dr MaVaughan Brownerr Halbert Jesson NP in 3 months and As needed   Please contact office for sooner follow up if symptoms do not improve or worsen or seek emergency care

## 2022-05-10 NOTE — Assessment & Plan Note (Signed)
Small lung nodule measuring up to 4 mm.  Recheck CT chest in 6 months

## 2022-05-10 NOTE — Assessment & Plan Note (Signed)
Clinically patient is much improved.  Serial CT is showed radiographical improvement as well.  Near totally resolution of the right lower lobe masslike consolidation.  Most recent CT does show a few small clustered nodules in the right upper lobe.  Concerning for possible intermittent aspiration.  We discussed aspiration precautions in detail.  Previous modified barium swallow did show some mild dysphagia with the pharyngoesophageal phase.   Plan  Patient Instructions  CT chest without contrast in 6 months   Continue on Omeprazole 37m Twice daily  before meals .  Aspiration precautions  GERD diet.  Sleep at incline - wedge pillow .  Follow up with GI as discussed.   Begin Zyrtec 112mAt bedtime.  Saline nasal spray Twice daily   Saline nasal gel. At bedtime   Begin Flonase 2 puffs daily  Albuterol inhaler As needed    Set up for CPAP titration study .   Follow up with Dr MaVaughan Brownerr Ader Fritze NP in 3 months and As needed   Please contact office for sooner follow up if symptoms do not improve or worsen or seek emergency care

## 2022-05-10 NOTE — Progress Notes (Signed)
$@PatientH$  ID: [name], female    DOB: Aug 26, 1950, 72 y.o.   MRN: [id number]  Chief Complaint  Patient presents with   Follow-up    Referring provider: [name], NP  HPI: 72 yo female former smoker followed for chronic cough and pneumonia   TEST/EVENTS :  Pets: 4 cats.  She is allergic to cats Occupation: Retired Web designer Exposures: Had mold in the previous home.  She moved out of this home in the year 2000.  No ongoing exposures with mold, hot tub, Jacuzzi.  No down pillows or comforters. Smoking history: 4-pack-year smoker.  Quit in 1990 Travel history: Originally from [place].  Previously lived in [place] Relevant family history: No significant family history of lung disease   Chest x-ray 07/10/2019-minimal left basilar scarring/atelectasis.     High-resolution CT 08/17/2019-no evidence of ILD, mild scarring at the lung base-postinfectious, coronary artery disease, aortic atherosclerosis.   PFTs 07/25/2019 FVC 2.84 (102%), FEV1 2.42 (115%), F/F 85, TLC 5.19 [110%], DLCO 16.91 [94%] Normal test   Labs: Respiratory allergy profile 5/40/21-IgE 25, RAST panel-negative CBC 07/31/2019-WBC 9.3, eos 4%, absolute eosinophil count 372  05/10/2022 Follow up: Pneumonia  Patient returns for a 1 month follow-up.  Patient was treated for acute bronchitis and pneumonia in October 2023 with x-ray showing left basilar opacities.  Initially treated with doxycycline, with no significant improvement of symptoms and then started on azithromycin and amoxicillin.  Subsequent CT chest on February 11, 2022 showed a small right-sided pleural effusion and irregular masslike consolidation in the right lower lobe.  Surrounding groundglass density and irregular consolidation.  Follow-up CT chest March 09, 2022 showed decrease in masslike consolidation in the right lower lobe.  Decreased nodular airspace.  With new right middle lobe and right upper lobe and left upper lobe  nodularity.  She was treated with a 7-day course of Augmentin.  Set up for a modified barium swallow done on March 30, 2022.  That showed normal oral and pharyngeal phase of swallow with no aspiration.  The to have mild dysphagia -pharyngoesophageal phase.  Recommended for aspiration precautions. Patient has been clinically improving.  She did have extensive work done her home with a new humidification and duct system placed.  Chest x-ray last visit showed stable platelike opacities in the right base.  Follow-up CT chest done on May 04, 2022 showed near complete resolution of the right lower lobe masslike consolidation with some residual linear opacities consistent with postinfectious scarring.  There were some small new clustered solid nodules in the right upper lobe measuring 4 mm.  Concern for possible aspiration.  And a new linear opacity in the right lower lobe likely sequela of interval aspiration/infection.  Resolution of groundglass nodules.  We discussed that she will need a follow-up CT chest in 6 months.  We also went over aspiration precautions again.  She says since last visit she is feeling much better.  Cough is almost totally resolved.  She does have some residual postnasal drainage and intermittent hoarseness.  Patient had restless sleep, loud snoring, daytime sleepiness, teeth clenching.  She was set up for home sleep study that was done on April 13, 2022 that showed severe sleep apnea with a AHI of 47.2/hour and SpO2 low at 69%, average O2 saturation was 84%.  We discussed her sleep study results in detail.  Went over treatment options.  We discussed with her degree of nocturnal hypoxemia we need to set up for a CPAP titration study.  Patient  is in agreement.  Allergies  Allergen Reactions   Compazine [Prochlorperazine]     hyperactivity   Morphine And Related Hives and Itching   Trintellix [Vortioxetine] Nausea And Vomiting    Immunization History  Administered Date(s)  Administered   Fluad Quad(high Dose 65+) 01/01/2021, 12/19/2021   Influenza, High Dose Seasonal PF 01/09/2016, 12/15/2016, 12/14/2017, 12/01/2018   Influenza,inj,Quad PF,6+ Mos 12/25/2013, 12/13/2014   Influenza-Unspecified 12/05/2019, 12/19/2021   Moderna Sars-Covid-2 Vaccination 12/19/2021   PFIZER(Purple Top)SARS-COV-2 Vaccination 05/03/2019, 05/24/2019, 12/05/2019   Pfizer Covid-19 Vaccine Bivalent Booster 44yr & up 01/17/2021   Pneumococcal Conjugate-13 02/17/2018   Pneumococcal Polysaccharide-23 12/13/2014, 03/02/2022   RSV,unspecified 12/19/2021   Td 06/06/2008   Zoster Recombinat (Shingrix) 12/11/2018, 03/26/2019   Zoster, Live 01/15/2011    Past Medical History:  Diagnosis Date   [place]) [place] 01[place]   Allergy    seasonal   Anxiety    Arachnoiditis    Cataract    Depression    Elevated liver enzymes    Environmental allergies    GERD (gastroesophageal reflux disease)    H/O being hospitalized 03/2012   x1 week for nausea   History of chicken pox    Hyperlipidemia    Hypertension    Migraine with typical aura    resolved years ago   Nausea    chronic nausea    Neuromuscular disorder ([facility]     Tobacco History: Social History   Tobacco Use  Smoking Status Former   Packs/day: 0.25   Years: 8.00   Total pack years: 2.00   Types: Cigarettes   Quit date: 08/09/1986   Years since quitting: 35.7  Smokeless Tobacco Never   Counseling given: Not Answered   Outpatient Medications Prior to Visit  Medication Sig Dispense Refill   amLODipine (NORVASC) 5 MG tablet TAKE 1 TABLET (5 MG TOTAL) BY MOUTH DAILY. 90 tablet 1   clonazePAM (KLONOPIN) 1 MG tablet TAKE 1 TABLET BY MOUTH THREE TIMES A DAY AS NEEDED FOR ANXIETY 90 tablet 0   escitalopram (LEXAPRO) 10 MG tablet Take 1 tablet (10 mg total) by mouth daily.     gabapentin (NEURONTIN) 100 MG capsule Take 2 capsules (200 mg total) by mouth 3 (three) times daily. 270 capsule 0   mirtazapine  (REMERON) 45 MG tablet Take 45 mg by mouth at bedtime.      montelukast (SINGULAIR) 10 MG tablet TAKE 1 TABLET BY MOUTH EVERYDAY AT BEDTIME 90 tablet 1   Multiple Vitamin (MULTIVITAMIN) capsule Take 1 capsule by mouth daily. Dynamic brain     omeprazole (PRILOSEC) 40 MG capsule TAKE 1 CAPSULE (40 MG TOTAL) BY MOUTH IN THE MORNING AND AT BEDTIME. 180 capsule 1   ondansetron (ZOFRAN-ODT) 4 MG disintegrating tablet TAKE 1 TABLET BY MOUTH EVERY 8 HOURS AS NEEDED FOR NAUSEA AND VOMITING 30 tablet 0   traZODone (DESYREL) 50 MG tablet Take 50-150 mg by mouth at bedtime as needed for sleep.      fluticasone (FLONASE) 50 MCG/ACT nasal spray Place 2 sprays into both nostrils daily. 16 g 6   No facility-administered medications prior to visit.     Review of Systems:   Constitutional:   No  weight loss, night sweats,  Fevers, chills,  +fatigue, or  lassitude.  HEENT:   No headaches,  Difficulty swallowing,  Tooth/dental problems, or  Sore throat,                No sneezing, itching, ear ache, +  nasal congestion, post nasal drip,   CV:  No chest pain,  Orthopnea, PND, swelling in lower extremities, anasarca, dizziness, palpitations, syncope.   GI  No heartburn, indigestion, abdominal pain, nausea, vomiting, diarrhea, change in bowel habits, loss of appetite, bloody stools.   Resp: .  No chest wall deformity  Skin: no rash or lesions.  GU: no dysuria, change in color of urine, no urgency or frequency.  No flank pain, no hematuria   MS:  No joint pain or swelling.  No decreased range of motion.  No back pain.    Physical Exam  BP 110/60 (BP Location: Left Arm, Patient Position: Sitting, Cuff Size: Large)   Pulse 76   Temp 98.1 F (36.7 C) (Oral)   Ht 5' 1.5" (1.562 m)   Wt 171 lb 6.4 oz (77.7 kg)   LMP 03/29/1993 Comment: 12/25/13 Pt states last cycle has been about 20 yrs ago.  SpO2 93%   BMI 31.86 kg/m   GEN: A/Ox3; pleasant , NAD, well nourished    HEENT:  [place]/AT,  NOSE-clear,  THROAT-clear, no lesions, no postnasal drip or exudate noted.   NECK:  Supple w/ fair ROM; no JVD; normal carotid impulses w/o bruits; no thyromegaly or nodules palpated; no lymphadenopathy.    RESP  Clear  P & A; w/o, wheezes/ rales/ or rhonchi. no accessory muscle use, no dullness to percussion  CARD:  RRR, no m/r/g, no peripheral edema, pulses intact, no cyanosis or clubbing.  GI:   Soft & nt; nml bowel sounds; no organomegaly or masses detected.   Musco: Warm bil, no deformities or joint swelling noted.   Neuro: alert, no focal deficits noted.    Skin: Warm, no lesions or rashes    Lab Results:  CBC    Component Value Date/Time   WBC 8.7 03/11/2022 1124   RBC 4.44 03/11/2022 1124   HGB 13.5 03/11/2022 1124   HCT 40.0 03/11/2022 1124   PLT 155.0 03/11/2022 1124   MCV 90.0 03/11/2022 1124   MCH 29.8 01/18/2020 1129   MCHC 33.8 03/11/2022 1124   RDW 14.4 03/11/2022 1124   LYMPHSABS 2.2 03/11/2022 1124   MONOABS 0.6 03/11/2022 1124   EOSABS 0.5 03/11/2022 1124   BASOSABS 0.1 03/11/2022 1124    BMET    Component Value Date/Time   NA 139 01/06/2022 1110   K 4.0 01/06/2022 1110   CL 106 01/06/2022 1110   CO2 25 01/06/2022 1110   GLUCOSE 86 01/06/2022 1110   BUN 19 01/06/2022 1110   CREATININE 0.98 01/06/2022 1110   CREATININE 0.93 02/28/2020 1254   CALCIUM 9.1 01/06/2022 1110   GFRNONAA 63 02/28/2020 1254   GFRAA 73 02/28/2020 1254    BNP No results found for: "BNP"  ProBNP No results found for: "PROBNP"  Imaging: CT Chest Wo Contrast  Result Date: 05/04/2022 CLINICAL DATA:  Lung mass EXAM: CT CHEST WITHOUT CONTRAST TECHNIQUE: Multidetector CT imaging of the chest was performed following the standard protocol without IV contrast. RADIATION DOSE REDUCTION: This exam was performed according to the departmental dose-optimization program which includes automated exposure control, adjustment of the mA and/or kV according to patient size and/or use of iterative  reconstruction technique. COMPARISON:  Multiple priors, most recent chest CT dated March 09, 2022 FINDINGS: Cardiovascular: Normal heart size. Trace pericardial effusion. Normal caliber thoracic aorta with moderate calcified plaque. Mild-to-moderate coronary artery calcifications. Mediastinum/Nodes: Small hiatal hernia. Thyroid is unremarkable. No pathologically enlarged lymph nodes seen in the chest. Lungs/Pleura:  Central airways are patent. Near complete interval resolution of right lower lobe masslike consolidation with mild residual linear opacities which are likely due to postinfectious scarring. Interval resolution of centrilobular ground-glass nodules. New clustered solid nodules of the posterior right upper lobe, largest measures 4 mm on series 4, image 49. New irregular linear opacity of the right lower lobe on series 4, image 102 with associated air bronchograms. No pleural effusion or pneumothorax. Upper Abdomen: Cholecystectomy clips. Pneumobilia again seen likely due to prior sphincterotomy. No acute abnormality. Musculoskeletal: No chest wall mass or suspicious bone lesions identified. IMPRESSION: 1. Near complete interval resolution of right lower lobe masslike consolidation with mild residual linear opacities which are consistent with postinfectious scarring. 2. New clustered solid nodules of the posterior right upper lobe, largest measures 4 mm, likely due to infection or aspiration. No follow-up needed if patient is low-risk (and has no known or suspected primary neoplasm). Non-contrast chest CT can be considered in 12 months if patient is high-risk. This recommendation follows the consensus statement: Guidelines for Management of Incidental Pulmonary Nodules Detected on CT Images: From the Fleischner Society 2017; Radiology 2017; 284:228-243. 3. New irregular linear opacity of the paraspinal right lower lobe, likely sequela of interval aspiration or infection. 4. Interval resolution  ground-glass centrilobular nodules. 5. Aortic Atherosclerosis (ICD10-I70.0). Electronically Signed   By: Yetta Glassman M.D.   On: 05/04/2022 17:15   DG Chest 2 View  Result Date: 04/13/2022 CLINICAL DATA:  Pneumonia EXAM: CHEST - 2 VIEW COMPARISON:  Chest x-ray dated 03/03/2022. Chest CT dated 03/09/2022. FINDINGS: Heart size and mediastinal contours are stable. Persistent platelike opacities at the RIGHT lung base, stable. No new lung findings. No pleural effusion or pneumothorax is seen. No acute-appearing osseous abnormality. IMPRESSION: 1. Stable platelike opacities at the RIGHT lung base, most likely scarring or atelectasis. 2. No new lung findings. 3. The more subtle findings on earlier chest CT (nodular airspace disease in the RIGHT lower lobe, subtle ground-glass nodules in the RIGHT lower lobe and subtle ill-defined nodules in the LEFT upper lobe) are likely below the limits of visualization by chest x-ray. As recommended on the earlier chest CT report, recommend follow-up chest CT in 2-4 weeks for direct comparison with the findings on the earlier chest CT of 03/09/2022. Electronically Signed   By: Franki Cabot M.D.   On: 04/13/2022 09:08         Latest Ref Rng & Units 07/25/2019   12:41 PM  PFT Results  FVC-Pre L 2.64   FVC-Predicted Pre % 95   FVC-Post L 2.84   FVC-Predicted Post % 102   Pre FEV1/FVC % % 83   Post FEV1/FCV % % 85   FEV1-Pre L 2.18   FEV1-Predicted Pre % 103   FEV1-Post L 2.42   DLCO uncorrected ml/min/mmHg 16.91   DLCO UNC% % 94   DLVA Predicted % 84   TLC L 5.19   TLC % Predicted % 110   RV % Predicted % 107     No results found for: "NITRICOXIDE"      Assessment & Plan:   Pneumonia Clinically patient is much improved.  Serial CT is showed radiographical improvement as well.  Near totally resolution of the right lower lobe masslike consolidation.  Most recent CT does show a few small clustered nodules in the right upper lobe.  Concerning for  possible intermittent aspiration.  We discussed aspiration precautions in detail.  Previous modified barium swallow did show some mild dysphagia  with the pharyngoesophageal phase.   Plan  Patient Instructions  CT chest without contrast in 6 months   Continue on Omeprazole 72m Twice daily  before meals .  Aspiration precautions  GERD diet.  Sleep at incline - wedge pillow .  Follow up with GI as discussed.   Begin Zyrtec 163mAt bedtime.  Saline nasal spray Twice daily   Saline nasal gel. At bedtime   Begin Flonase 2 puffs daily  Albuterol inhaler As needed    Set up for CPAP titration study .   Follow up with Dr MaVaughan Brownerr Harly Pipkins NP in 3 months and As needed   Please contact office for sooner follow up if symptoms do not improve or worsen or seek emergency care     Allergic rhinitis Mild flare.  Add in Zyrtec at bedtime.  Flonase as needed  Plan  PLAN:  Patient Instructions  CT chest without contrast in 6 months   Continue on Omeprazole 4057mwice daily  before meals .  Aspiration precautions  GERD diet.  Sleep at incline - wedge pillow .  Follow up with GI as discussed.   Begin Zyrtec 10m46m bedtime.  Saline nasal spray Twice daily   Saline nasal gel. At bedtime   Begin Flonase 2 puffs daily  Albuterol inhaler As needed    Set up for CPAP titration study .   Follow up with Dr MannVaughan BrownerParrett NP in 3 months and As needed   Please contact office for sooner follow up if symptoms do not improve or worsen or seek emergency care     Lung nodules Small lung nodule measuring up to 4 mm.  Recheck CT chest in 6 months  OSA (obstructive sleep apnea) Severe sleep apnea with nocturnal hypoxemia.  Set up for CPAP titration study - discussed how weight can impact sleep and risk for sleep disordered breathing - discussed options to assist with weight loss: combination of diet modification, cardiovascular and strength training exercises   - had an extensive discussion  regarding the adverse health consequences related to untreated sleep disordered breathing - specifically discussed the risks for hypertension, coronary artery disease, cardiac dysrhythmias, cerebrovascular disease, and diabetes - lifestyle modification discussed   - discussed how sleep disruption can increase risk of accidents, particularly when driving - safe driving practices were discussed      I spent  42  minutes dedicated to the care of this patient on the date of this encounter to include pre-visit review of records, face-to-face time with the patient discussing conditions above, post visit ordering of testing, clinical documentation with the electronic health record, making appropriate referrals as documented, and communicating necessary findings to members of the patients care team.   TammRexene Edison 05/10/2022

## 2022-05-20 ENCOUNTER — Other Ambulatory Visit: Payer: Self-pay | Admitting: Family

## 2022-05-24 ENCOUNTER — Ambulatory Visit: Payer: Medicare Other | Admitting: Adult Health

## 2022-05-24 ENCOUNTER — Encounter: Payer: Self-pay | Admitting: Adult Health

## 2022-05-24 ENCOUNTER — Ambulatory Visit (INDEPENDENT_AMBULATORY_CARE_PROVIDER_SITE_OTHER): Payer: Medicare Other

## 2022-05-24 VITALS — BP 122/88 | HR 68 | Temp 98.2°F | Ht 61.0 in | Wt 170.2 lb

## 2022-05-24 DIAGNOSIS — J189 Pneumonia, unspecified organism: Secondary | ICD-10-CM

## 2022-05-24 DIAGNOSIS — G4733 Obstructive sleep apnea (adult) (pediatric): Secondary | ICD-10-CM | POA: Diagnosis not present

## 2022-05-24 DIAGNOSIS — J4521 Mild intermittent asthma with (acute) exacerbation: Secondary | ICD-10-CM | POA: Insufficient documentation

## 2022-05-24 DIAGNOSIS — J309 Allergic rhinitis, unspecified: Secondary | ICD-10-CM

## 2022-05-24 DIAGNOSIS — R059 Cough, unspecified: Secondary | ICD-10-CM | POA: Diagnosis not present

## 2022-05-24 MED ORDER — PREDNISONE 20 MG PO TABS: 20.0000 mg | ORAL_TABLET | Freq: Every day | ORAL | 0 refills | Status: DC

## 2022-05-24 MED ORDER — LEVOFLOXACIN 500 MG PO TABS: 500.0000 mg | ORAL_TABLET | Freq: Every day | ORAL | 0 refills | Status: AC

## 2022-05-24 MED ORDER — ALBUTEROL SULFATE HFA 108 (90 BASE) MCG/ACT IN AERS: 1.0000 | INHALATION_SPRAY | Freq: Four times a day (QID) | RESPIRATORY_TRACT | 2 refills | Status: DC | PRN

## 2022-05-24 MED ORDER — FLUTICASONE FUROATE-VILANTEROL 200-25 MCG/ACT IN AEPB: 1.0000 | INHALATION_SPRAY | Freq: Every day | RESPIRATORY_TRACT | 5 refills | Status: DC

## 2022-05-24 MED ORDER — BENZONATATE 200 MG PO CAPS: 200.0000 mg | ORAL_CAPSULE | Freq: Three times a day (TID) | ORAL | 1 refills | Status: DC | PRN

## 2022-05-24 NOTE — Assessment & Plan Note (Addendum)
May have a component of asthma versus reactive airways.  Now with recurrent bronchitis. Once her cough is improved would consider repeating PFTs going forward.  Previous PFTs in 2021 were essentially normal Trial of Breo daily.  Albuterol as needed. Short course of prednisone  Plan  Patient Instructions  Levaquin 500 mg daily for 10 days-take with food  Delsym 2 tsp Twice daily  for cough As needed   Tessalon Three times a day  for cough As needed   Begin Breo 1 puff daily  Albuterol inhaler .As needed   Prednisone '20mg'$  daily for 5 days , take with food.   Continue on Omeprazole '40mg'$  Twice daily  before meals .  Aspiration precautions  GERD diet.  Sleep at incline - wedge pillow .  Set up follow up with GI as discussed.   Singulair daily.  Zyrtec '10mg'$  At bedtime.  Saline nasal spray Twice daily   Saline nasal gel. At bedtime   Flonase 2 puffs daily  Albuterol inhaler As needed   Refer to ENT .   CPAP titration study when able.   Follow up with Dr [name] or Tecora Eustache NP in 3 weeks and As needed

## 2022-05-24 NOTE — Progress Notes (Signed)
c  '@Patient'$  ID: [name], female    DOB: 05/08/1950, 72 y.o.   MRN: [id number]  Chief Complaint  Patient presents with   Follow-up    Referring provider: [name], NP  HPI: 72 year old female former smoker followed for chronic cough and pneumonia  TEST/EVENTS :  Pets: 4 cats.  She is allergic to cats Occupation: Retired Web designer Exposures: Had mold in the previous home.  She moved out of this home in the year 2000.  No ongoing exposures with mold, hot tub, Jacuzzi.  No down pillows or comforters. Smoking history: 4-pack-year smoker.  Quit in 1990 Travel history: Originally from [place].  Previously lived in [place] Relevant family history: No significant family history of lung disease   Chest x-ray 07/10/2019-minimal left basilar scarring/atelectasis.     High-resolution CT 08/17/2019-no evidence of ILD, mild scarring at the lung base-postinfectious, coronary artery disease, aortic atherosclerosis.  CT chest on February 11, 2022 showed a small right-sided pleural effusion and irregular masslike consolidation in the right lower lobe. Surrounding groundglass density and irregular consolidation   CT chest March 09, 2022 showed decrease in masslike consolidation in the right lower lobe. Decreased nodular airspace. With new right middle lobe and right upper lobe and left upper lobe nodularity. She was treated with a 7-day course of Augmentin.   CT chest done on May 04, 2022 showed near complete resolution of the right lower lobe masslike consolidation with some residual linear opacities consistent with postinfectious scarring. There were some small new clustered solid nodules in the right upper lobe measuring 4 mm. Concern for possible aspiration. And a new linear opacity in the right lower lobe likely sequela of interval aspiration/infection. Resolution of groundglass nodules.   modified barium swallow done on March 30, 2022.  That showed normal oral  and pharyngeal phase of swallow with no aspiration.  The to have mild dysphagia -pharyngoesophageal phase.    Home sleep study that was done on April 13, 2022 that showed severe sleep apnea with a AHI of 47.2/hour and SpO2 low at 69%, average O2 saturation was 84%.    PFTs 07/25/2019 FVC 2.84 (102%), FEV1 2.42 (115%), F/F 85, TLC 5.19 [110%], DLCO 16.91 [94%] Normal test   Labs: Respiratory allergy profile 5/40/21-IgE 25, RAST panel-negative CBC 07/31/2019-WBC 9.3, eos 4%, absolute eosinophil count 372  05/24/2022 Acute OV: Cough  Patient presents for an acute office visit complains of 1 week of sore throat, headache, cough, congestion, barking cough, sinus pressure, body aches. Also had severe episode of nausea , vomiting and diarrhea for several hours that lasted over night 5 days ago. . Cough has been worse since this episode. No fever.  COVID test and flu test today in the office were both negative.  Complains that cough is mainly productive and unable to cough up anything. Patient has been dealing with recurrent bronchitis and pneumonia since October.  Initially treated with doxycycline.  Chest x-ray showed left basilar opacities.  She had no improvement in symptoms then was started on azithromycin and amoxicillin.  Subsequent CT chest February 11, 2022 showed small right-sided pleural effusion and masslike consolidation in the right lower lobe.  Follow-up CT chest on March 09, 2022 showed decrease in masslike consolidation in the right lower lobe and new right middle lobe and right upper lobe and left upper lobe nodularity.  She was treated with 7-day course of Augmentin.  Set up for a modified barium swallow.  This was done on March 30, 2022 that  showed mild dysphagia and the pharyngoesophageal phase. CT chest May 04, 2022 showed near complete interval resolution of the right lower lobe masslike consolidation with mild residual linear opacities consistent with postinfectious scarring, new  clustered solid nodules in the right upper lobe largest measuring 4 mm questionable secondary to infection or aspiration and new opacity in the right lower lobe, resolution of groundglass nodules  She was recommended on aspiration precautions.  Patient was seen 72 weeks ago and was feeling much better.  She was continued on Prilosec twice daily.  Recommend to sleep at an incline , GI follow-up.  Along with aspiration precautions.  She also was set up for CT chest in 6 months.  She has not set up GI follow-up. She denies any joint swelling or rashes.   Patient was found to have severe sleep apnea on home sleep study done on April 13, 2022.  She was recommended for a CPAP titration study.  This is still pending. She remains on Klonopin, Lexapro, Neurontin, Remeron and trazodone. (Has cut back on remeron/trazodone to 2x week) . Has chronic pain, anxiety and depression.    Allergies  Allergen Reactions   Compazine [Prochlorperazine]     hyperactivity   Morphine And Related Hives and Itching   Trintellix [Vortioxetine] Nausea And Vomiting    Immunization History  Administered Date(s) Administered   Fluad Quad(high Dose 65+) 01/01/2021, 12/19/2021   Influenza, High Dose Seasonal PF 01/09/2016, 12/15/2016, 12/14/2017, 12/01/2018   Influenza,inj,Quad PF,6+ Mos 12/25/2013, 12/13/2014   Influenza-Unspecified 12/05/2019, 12/19/2021   Moderna Sars-Covid-2 Vaccination 12/19/2021   PFIZER(Purple Top)SARS-COV-2 Vaccination 05/03/2019, 05/24/2019, 12/05/2019   Pfizer Covid-19 Vaccine Bivalent Booster 60yr & up 01/17/2021   Pneumococcal Conjugate-13 02/17/2018   Pneumococcal Polysaccharide-23 12/13/2014, 03/02/2022   RSV,unspecified 12/19/2021   Td 06/06/2008   Zoster Recombinat (Shingrix) 12/11/2018, 03/26/2019   Zoster, Live 01/15/2011    Past Medical History:  Diagnosis Date   [place]   Allergy    seasonal   Anxiety    Arachnoiditis    Cataract     Depression    Elevated liver enzymes    Environmental allergies    GERD (gastroesophageal reflux disease)    H/O being hospitalized 03/2012   x1 week for nausea   History of chicken pox    Hyperlipidemia    Hypertension    Migraine with typical aura    resolved years ago   Nausea    chronic nausea    [place]     Tobacco History: Social History   Tobacco Use  Smoking Status Former   Packs/day: 0.25   Years: 8.00   Total pack years: 2.00   Types: Cigarettes   Quit date: 08/09/1986   Years since quitting: 35.8  Smokeless Tobacco Never   Counseling given: Not Answered   Outpatient Medications Prior to Visit  Medication Sig Dispense Refill   amLODipine (NORVASC) 5 MG tablet TAKE 1 TABLET (5 MG TOTAL) BY MOUTH DAILY. 90 tablet 1   clonazePAM (KLONOPIN) 1 MG tablet TAKE 1 TABLET BY MOUTH THREE TIMES A DAY AS NEEDED FOR ANXIETY 90 tablet 0   escitalopram (LEXAPRO) 10 MG tablet Take 1 tablet (10 mg total) by mouth daily.     gabapentin (NEURONTIN) 100 MG capsule TAKE 2 CAPSULES BY MOUTH 3 TIMES DAILY. 270 capsule 0   mirtazapine (REMERON) 45 MG tablet Take 45 mg by mouth at bedtime.      montelukast (SINGULAIR) 10 MG tablet TAKE  1 TABLET BY MOUTH EVERYDAY AT BEDTIME 90 tablet 1   Multiple Vitamin (MULTIVITAMIN) capsule Take 1 capsule by mouth daily. Dynamic brain     omeprazole (PRILOSEC) 40 MG capsule TAKE 1 CAPSULE (40 MG TOTAL) BY MOUTH IN THE MORNING AND AT BEDTIME. 180 capsule 1   ondansetron (ZOFRAN-ODT) 4 MG disintegrating tablet TAKE 1 TABLET BY MOUTH EVERY 8 HOURS AS NEEDED FOR NAUSEA AND VOMITING 30 tablet 0   traZODone (DESYREL) 50 MG tablet Take 50-150 mg by mouth at bedtime as needed for sleep.      No facility-administered medications prior to visit.     Review of Systems:   Constitutional:   No  weight loss, night sweats,  Fevers, chills, + fatigue, or  lassitude.  HEENT:   No headaches,  Difficulty swallowing,  Tooth/dental problems, or   Sore throat,                No sneezing, itching, ear ache,  +nasal congestion, post nasal drip,   CV:  No chest pain,  Orthopnea, PND, swelling in lower extremities, anasarca, dizziness, palpitations, syncope.   GI  No heartburn, indigestion, abdominal pain, nausea, vomiting, diarrhea, change in bowel habits, loss of appetite, bloody stools.   Resp:  No chest wall deformity  Skin: no rash or lesions.  GU: no dysuria, change in color of urine, no urgency or frequency.  No flank pain, no hematuria   MS:  No joint pain or swelling.  No decreased range of motion.  No back pain.    Physical Exam  BP 122/88   Pulse 68   Temp 98.2 F (36.8 C) (Oral)   Ht '5\' 1"'$  (1.549 m)   Wt 170 lb 3.2 oz (77.2 kg)   LMP 03/29/1993 Comment: 12/25/13 Pt states last cycle has been about 20 yrs ago.  SpO2 93%   BMI 32.16 kg/m   GEN: A/Ox3; pleasant , NAD, well nourished    HEENT:  [place]/AT,  EACs-clear, TMs-wnl, NOSE-clear, THROAT-clear, no lesions, no postnasal drip or exudate noted.   NECK:  Supple w/ fair ROM; no JVD; normal carotid impulses w/o bruits; no thyromegaly or nodules palpated; no lymphadenopathy.    RESP few scattered rhonchi   no accessory muscle use, no dullness to percussion  CARD:  RRR, no m/r/g, no peripheral edema, pulses intact, no cyanosis or clubbing.  GI:   Soft & nt; nml bowel sounds; no organomegaly or masses detected.   Musco: Warm bil, no deformities or joint swelling noted.   Neuro: alert, no focal deficits noted.    Skin: Warm, no lesions or rashes    Lab Results:  CBC    Component Value Date/Time   WBC 8.7 03/11/2022 1124   RBC 4.44 03/11/2022 1124   HGB 13.5 03/11/2022 1124   HCT 40.0 03/11/2022 1124   PLT 155.0 03/11/2022 1124   MCV 90.0 03/11/2022 1124   MCH 29.8 01/18/2020 1129   MCHC 33.8 03/11/2022 1124   RDW 14.4 03/11/2022 1124   LYMPHSABS 2.2 03/11/2022 1124   MONOABS 0.6 03/11/2022 1124   EOSABS 0.5 03/11/2022 1124   BASOSABS 0.1  03/11/2022 1124    BMET    Component Value Date/Time   NA 139 01/06/2022 1110   K 4.0 01/06/2022 1110   CL 106 01/06/2022 1110   CO2 25 01/06/2022 1110   GLUCOSE 86 01/06/2022 1110   BUN 19 01/06/2022 1110   CREATININE 0.98 01/06/2022 1110   CREATININE 0.93 02/28/2020 1254  CALCIUM 9.1 01/06/2022 1110   GFRNONAA 63 02/28/2020 1254   GFRAA 73 02/28/2020 1254    BNP No results found for: "BNP"  ProBNP No results found for: "PROBNP"  Imaging: DG Chest 2 View  Result Date: 05/24/2022 CLINICAL DATA:  Cough and chest congestion.  Recurrent pneumonia. EXAM: CHEST - 2 VIEW COMPARISON:  04/13/2022 FINDINGS: Heart size is normal. Aortic tortuosity. Mild persistent or recurrent patchy density in both lower lungs. Bronchial thickening. No lobar consolidation or collapse. No effusion. IMPRESSION: Mild persistent or recurrent patchy density in both lower lungs. Bronchial thickening. Markings could represent a combination of active inflammatory disease and chronic scarring. No lobar consolidation or collapse. Electronically Signed   By: Nelson Chimes M.D.   On: 05/24/2022 09:33   CT Chest Wo Contrast  Result Date: 05/04/2022 CLINICAL DATA:  Lung mass EXAM: CT CHEST WITHOUT CONTRAST TECHNIQUE: Multidetector CT imaging of the chest was performed following the standard protocol without IV contrast. RADIATION DOSE REDUCTION: This exam was performed according to the departmental dose-optimization program which includes automated exposure control, adjustment of the mA and/or kV according to patient size and/or use of iterative reconstruction technique. COMPARISON:  Multiple priors, most recent chest CT dated March 09, 2022 FINDINGS: Cardiovascular: Normal heart size. Trace pericardial effusion. Normal caliber thoracic aorta with moderate calcified plaque. Mild-to-moderate coronary artery calcifications. Mediastinum/Nodes: Small hiatal hernia. Thyroid is unremarkable. No pathologically enlarged lymph  nodes seen in the chest. Lungs/Pleura: Central airways are patent. Near complete interval resolution of right lower lobe masslike consolidation with mild residual linear opacities which are likely due to postinfectious scarring. Interval resolution of centrilobular ground-glass nodules. New clustered solid nodules of the posterior right upper lobe, largest measures 4 mm on series 4, image 49. New irregular linear opacity of the right lower lobe on series 4, image 102 with associated air bronchograms. No pleural effusion or pneumothorax. Upper Abdomen: Cholecystectomy clips. Pneumobilia again seen likely due to prior sphincterotomy. No acute abnormality. Musculoskeletal: No chest wall mass or suspicious bone lesions identified. IMPRESSION: 1. Near complete interval resolution of right lower lobe masslike consolidation with mild residual linear opacities which are consistent with postinfectious scarring. 2. New clustered solid nodules of the posterior right upper lobe, largest measures 4 mm, likely due to infection or aspiration. No follow-up needed if patient is low-risk (and has no known or suspected primary neoplasm). Non-contrast chest CT can be considered in 12 months if patient is high-risk. This recommendation follows the consensus statement: Guidelines for Management of Incidental Pulmonary Nodules Detected on CT Images: From the Fleischner Society 2017; Radiology 2017; 284:228-243. 3. New irregular linear opacity of the paraspinal right lower lobe, likely sequela of interval aspiration or infection. 4. Interval resolution ground-glass centrilobular nodules. 5. Aortic Atherosclerosis (ICD10-I70.0). Electronically Signed   By: Yetta Glassman M.D.   On: 05/04/2022 17:15         Latest Ref Rng & Units 07/25/2019   12:41 PM  PFT Results  FVC-Pre L 2.64   FVC-Predicted Pre % 95   FVC-Post L 2.84   FVC-Predicted Post % 102   Pre FEV1/FVC % % 83   Post FEV1/FCV % % 85   FEV1-Pre L 2.18    FEV1-Predicted Pre % 103   FEV1-Post L 2.42   DLCO uncorrected ml/min/mmHg 16.91   DLCO UNC% % 94   DLVA Predicted % 84   TLC L 5.19   TLC % Predicted % 110   RV % Predicted % 107  No results found for: "NITRICOXIDE"      Assessment & Plan:   Pneumonia Questionable recurrent aspiration pneumonia.  Chest x-ray shows some bibasilar patchy densities.  Patient with viral like illness with respiratory and GI symptoms.  Flu and COVID test today in the office are both negative.  Previous masslike consolidation of the right lower lobe did resolve however new areas in the right upper lobe and right lower lobe were noted on most recent CT scan earlier this month.  Concern is therefore recurrent aspiration.  Patient is on multiple sedating medications .  Recent new diagnosis of severe sleep apnea with CPAP titration study pending.  Modified barium swallow showed mild dysphagia. Unable to send sputum culture as patient is unable to cough up any mucus.  Will treat with a 10-day course of Levaquin..  Patient is taking Cipro.  She has no known history of prolonged QT syndrome.  Reviewed potential medication side effects. If patient continues to have ongoing symptoms may need to consider bronchoscopy with BAL  Case was discussed with Dr. Kimber Relic and she has follow-up in 3 weeks with him.   Plan  Patient Instructions  Levaquin 500 mg daily for 10 days-take with food  Delsym 2 tsp Twice daily  for cough As needed   Tessalon Three times a day  for cough As needed   Begin Breo 1 puff daily  Albuterol inhaler .As needed   Prednisone '20mg'$  daily for 5 days , take with food.   Continue on Omeprazole '40mg'$  Twice daily  before meals .  Aspiration precautions  GERD diet.  Sleep at incline - wedge pillow .  Set up follow up with GI as discussed.   Singulair daily.  Zyrtec '10mg'$  At bedtime.  Saline nasal spray Twice daily   Saline nasal gel. At bedtime   Flonase 2 puffs daily  Albuterol inhaler As  needed   Refer to ENT .   CPAP titration study when able.   Follow up with Dr [name] or Dawnyel Leven NP in 3 weeks and As needed        Mild intermittent acute asthmatic bronchitis May have a component of asthma versus reactive airways.  Now with recurrent bronchitis. Once her cough is improved would consider repeating PFTs going forward.  Previous PFTs in 2021 were essentially normal Trial of Breo daily.  Albuterol as needed. Short course of prednisone  Plan  Patient Instructions  Levaquin 500 mg daily for 10 days-take with food  Delsym 2 tsp Twice daily  for cough As needed   Tessalon Three times a day  for cough As needed   Begin Breo 1 puff daily  Albuterol inhaler .As needed   Prednisone '20mg'$  daily for 5 days , take with food.   Continue on Omeprazole '40mg'$  Twice daily  before meals .  Aspiration precautions  GERD diet.  Sleep at incline - wedge pillow .  Set up follow up with GI as discussed.   Singulair daily.  Zyrtec '10mg'$  At bedtime.  Saline nasal spray Twice daily   Saline nasal gel. At bedtime   Flonase 2 puffs daily  Albuterol inhaler As needed   Refer to ENT .   CPAP titration study when able.   Follow up with Dr [name] or Suzan Manon NP in 3 weeks and As needed       Allergic rhinitis Continue on trigger prevention.  Patient has a history of chronic rhinitis and sinusitis.  CT sinus in September showed some mild left  sinus thickening without acute infection noted. Refer to ENT   Plan  Patient Instructions  Levaquin 500 mg daily for 10 days-take with food  Delsym 2 tsp Twice daily  for cough As needed   Tessalon Three times a day  for cough As needed   Begin Breo 1 puff daily  Albuterol inhaler .As needed   Prednisone '20mg'$  daily for 5 days , take with food.   Continue on Omeprazole '40mg'$  Twice daily  before meals .  Aspiration precautions  GERD diet.  Sleep at incline - wedge pillow .  Set up follow up with GI as discussed.   Singulair daily.  Zyrtec  '10mg'$  At bedtime.  Saline nasal spray Twice daily   Saline nasal gel. At bedtime   Flonase 2 puffs daily  Albuterol inhaler As needed   Refer to ENT .   CPAP titration study when able.   Follow up with Dr [name] or Jerron Niblack NP in 3 weeks and As needed       OSA (obstructive sleep apnea) Severe sleep apnea with nocturnal hypoxemia.  CPAP titration study is pending.   I spent   45 minutes dedicated to the care of this patient on the date of this encounter to include pre-visit review of records, face-to-face time with the patient discussing conditions above, post visit ordering of testing, clinical documentation with the electronic health record, making appropriate referrals as documented, and communicating necessary findings to members of the patients care team.    Rexene Edison, NP 05/24/2022

## 2022-05-24 NOTE — Progress Notes (Signed)
Patient seen in the office today and instructed on use of Breo.  Patient expressed understanding and demonstrated technique.  

## 2022-05-24 NOTE — Assessment & Plan Note (Signed)
Questionable recurrent aspiration pneumonia.  Chest x-ray shows some bibasilar patchy densities.  Patient with viral like illness with respiratory and GI symptoms.  Flu and COVID test today in the office are both negative.  Previous masslike consolidation of the right lower lobe did resolve however new areas in the right upper lobe and right lower lobe were noted on most recent CT scan earlier this month.  Concern is therefore recurrent aspiration.  Patient is on multiple sedating medications .  Recent new diagnosis of severe sleep apnea with CPAP titration study pending.  Modified barium swallow showed mild dysphagia. Unable to send sputum culture as patient is unable to cough up any mucus.  Will treat with a 10-day course of Levaquin..  Patient is taking Cipro.  She has no known history of prolonged QT syndrome.  Reviewed potential medication side effects. If patient continues to have ongoing symptoms may need to consider bronchoscopy with BAL  Case was discussed with Dr. Kimber Relic and she has follow-up in 3 weeks with him.   Plan  Patient Instructions  Levaquin 500 mg daily for 10 days-take with food  Delsym 2 tsp Twice daily  for cough As needed   Tessalon Three times a day  for cough As needed   Begin Breo 1 puff daily  Albuterol inhaler .As needed   Prednisone '20mg'$  daily for 5 days , take with food.   Continue on Omeprazole '40mg'$  Twice daily  before meals .  Aspiration precautions  GERD diet.  Sleep at incline - wedge pillow .  Set up follow up with GI as discussed.   Singulair daily.  Zyrtec '10mg'$  At bedtime.  Saline nasal spray Twice daily   Saline nasal gel. At bedtime   Flonase 2 puffs daily  Albuterol inhaler As needed   Refer to ENT .   CPAP titration study when able.   Follow up with Dr [name] or Creedence Heiss NP in 3 weeks and As needed

## 2022-05-24 NOTE — Assessment & Plan Note (Signed)
Continue on trigger prevention.  Patient has a history of chronic rhinitis and sinusitis.  CT sinus in September showed some mild left sinus thickening without acute infection noted. Refer to ENT   Plan  Patient Instructions  Levaquin 500 mg daily for 10 days-take with food  Delsym 2 tsp Twice daily  for cough As needed   Tessalon Three times a day  for cough As needed   Begin Breo 1 puff daily  Albuterol inhaler .As needed   Prednisone '20mg'$  daily for 5 days , take with food.   Continue on Omeprazole '40mg'$  Twice daily  before meals .  Aspiration precautions  GERD diet.  Sleep at incline - wedge pillow .  Set up follow up with GI as discussed.   Singulair daily.  Zyrtec '10mg'$  At bedtime.  Saline nasal spray Twice daily   Saline nasal gel. At bedtime   Flonase 2 puffs daily  Albuterol inhaler As needed   Refer to ENT .   CPAP titration study when able.   Follow up with Dr [name] or Belissa Kooy NP in 3 weeks and As needed

## 2022-05-24 NOTE — Progress Notes (Signed)
Awaiting insurance authorization for CPAP titration study.

## 2022-05-24 NOTE — Patient Instructions (Addendum)
Levaquin 500 mg daily for 10 days-take with food  Delsym 2 tsp Twice daily  for cough As needed   Tessalon Three times a day  for cough As needed   Begin Breo 1 puff daily  Albuterol inhaler .As needed   Prednisone '20mg'$  daily for 5 days , take with food.   Continue on Omeprazole '40mg'$  Twice daily  before meals .  Aspiration precautions  GERD diet.  Sleep at incline - wedge pillow .  Set up follow up with GI as discussed.   Singulair daily.  Zyrtec '10mg'$  At bedtime.  Saline nasal spray Twice daily   Saline nasal gel. At bedtime   Flonase 2 puffs daily  Albuterol inhaler As needed   Refer to ENT .   CPAP titration study when able.   Follow up with Dr [name] or Josslynn Mentzer NP in 3 weeks and As needed

## 2022-05-24 NOTE — Assessment & Plan Note (Signed)
Severe sleep apnea with nocturnal hypoxemia.  CPAP titration study is pending.

## 2022-06-02 ENCOUNTER — Other Ambulatory Visit: Payer: Self-pay | Admitting: Gastroenterology

## 2022-06-15 ENCOUNTER — Ambulatory Visit: Payer: Medicare Other | Admitting: Pulmonary Disease

## 2022-06-16 ENCOUNTER — Ambulatory Visit: Payer: Medicare Other | Admitting: Gastroenterology

## 2022-06-18 ENCOUNTER — Telehealth: Payer: Self-pay | Admitting: Adult Health

## 2022-06-18 NOTE — Telephone Encounter (Signed)
Spoke with patient advised since [name] order sleep study I can make a f/u to see her and she can still keep apt with Dr. [name]. Patient agreed and I have her scheduled to discuss sleep study results with patient. Nothing further needed.

## 2022-07-03 ENCOUNTER — Other Ambulatory Visit: Payer: Self-pay | Admitting: Family

## 2022-07-05 ENCOUNTER — Encounter: Payer: Self-pay | Admitting: Family

## 2022-07-05 ENCOUNTER — Ambulatory Visit (HOSPITAL_BASED_OUTPATIENT_CLINIC_OR_DEPARTMENT_OTHER): Payer: Medicare Other | Attending: Adult Health | Admitting: Pulmonary Disease

## 2022-07-05 ENCOUNTER — Ambulatory Visit (INDEPENDENT_AMBULATORY_CARE_PROVIDER_SITE_OTHER): Payer: Medicare Other | Admitting: Family

## 2022-07-05 VITALS — BP 139/48 | HR 70 | Temp 98.2°F | Resp 16 | Wt 167.0 lb

## 2022-07-05 DIAGNOSIS — M25561 Pain in right knee: Secondary | ICD-10-CM

## 2022-07-05 DIAGNOSIS — F419 Anxiety disorder, unspecified: Secondary | ICD-10-CM | POA: Diagnosis not present

## 2022-07-05 DIAGNOSIS — F32A Depression, unspecified: Secondary | ICD-10-CM

## 2022-07-05 DIAGNOSIS — I10 Essential (primary) hypertension: Secondary | ICD-10-CM

## 2022-07-05 DIAGNOSIS — E781 Pure hyperglyceridemia: Secondary | ICD-10-CM | POA: Diagnosis not present

## 2022-07-05 DIAGNOSIS — G4733 Obstructive sleep apnea (adult) (pediatric): Secondary | ICD-10-CM | POA: Diagnosis not present

## 2022-07-05 NOTE — Assessment & Plan Note (Signed)
Reports stable, followed by psychiatry.

## 2022-07-05 NOTE — Assessment & Plan Note (Signed)
Lab Results  Component Value Date   CHOL 214 (H) 01/06/2022   HDL 53.90 01/06/2022   LDLCALC 128 (H) 01/06/2022   LDLDIRECT 110.0 12/30/2020   TRIG 159.0 (H) 01/06/2022   CHOLHDL 4 01/06/2022   Fair control on low cholesterol diet.

## 2022-07-05 NOTE — Assessment & Plan Note (Signed)
New.  Will refer to orthopedics for further evaluation.

## 2022-07-05 NOTE — Progress Notes (Signed)
Subjective:   By signing my name below, I, Barrett ShellKennedy C Lynn, attest that this documentation has been prepared under the direction and in the presence of Sandford Craze'Sullivan, Habib Kise, NP. 07/05/2022   Patient ID: [name], female    DOB: 06/28/1950, 72 y.o.   MRN: [id number]  Chief Complaint  Patient presents with   Knee Pain    Patient complains of right knee "popping".     HPI Patient is in today for an office visit.   Right knee pain: She complains of right knee pain over the last few months accompanied by popping. The pain worsens upon palpation and motion. The pain radiates down the back of her right leg to the knee and ankle. She is taking aspirin as needed to relieve the pain.  Blood pressure: Her blood pressure is slightly elevated during this visit. She is taking 5 mg amlodipine daily PO to manage it.  BP Readings from Last 3 Encounters:  07/05/22 (!) 139/48  05/24/22 122/88  05/10/22 110/60   Sleep: She is not currently using a CPAP. She has an upcoming sleep study scheduled.   Cholesterol: Her cholesterol and triglycerides were mildly elevated during her last blood work.   Immunizations: She is UTD on the latest Covid-19 immunization.   Past Medical History:  Diagnosis Date   AKI (acute kidney injury) 01/09/2020   Allergy    seasonal   Anxiety    Arachnoiditis    Cataract    Depression    Elevated liver enzymes    Environmental allergies    GERD (gastroesophageal reflux disease)    H/O being hospitalized 03/2012   x1 week for nausea   History of chicken pox    Hyperlipidemia    Hypertension    Migraine with typical aura    resolved years ago   Nausea    chronic nausea    Neuromuscular disorder    Pneumonia 03/02/2022    Past Surgical History:  Procedure Laterality Date   ABDOMINOPLASTY  07/11/09   EUS N/A 05/05/2012   Procedure: UPPER ENDOSCOPIC ULTRASOUND (EUS) LINEAR;  Surgeon: Theda BelfastPatrick D Hung, MD;  Location: [facility];  Service: Endoscopy;  Laterality:  N/A;   LAPAROSCOPIC CHOLECYSTECTOMY  2010   NASAL SINUS SURGERY     "Has had 2-3 surgeries over the years"   UMBILICAL HERNIA REPAIR N/A 10/06/2020   Procedure: UMBILICAL HERNIA REPAIR;  Surgeon: Abigail MiyamotoBlackman, Douglas, MD;  Location: [facility];  Service: General;  Laterality: N/A;  LMA ANESTHESIA    Family History  Problem Relation Age of Onset   Alzheimer's disease Mother    Hypertension Mother    Migraines Mother    Hypertension Father    Other Father        ?carotid artery aneurysm   Cancer Brother        mouth and throat   Colon cancer Neg Hx     Social History   Socioeconomic History   Marital status: Married    Spouse name: Not on file   Number of children: Not on file   Years of education: Not on file   Highest education level: 12th grade  Occupational History   Not on file  Tobacco Use   Smoking status: Former    Packs/day: 0.25    Years: 8.00    Additional pack years: 0.00    Total pack years: 2.00    Types: Cigarettes    Quit date: 08/09/1986    Years since quitting: 35.9  Smokeless tobacco: Never  Vaping Use   Vaping Use: Never used  Substance and Sexual Activity   Alcohol use: No   Drug use: No   Sexual activity: Not Currently    Partners: Male    Birth control/protection: Other-see comments, Post-menopausal    Comment: husband had vasectomy.   Other Topics Concern   Not on file  Social History Narrative   2 step daughters- 76 and 17   She worked as an Environmental health practitioner.   Enjoys puzzles, counted cross stitch.   Completed 1 year of college   4 cats   Social Determinants of Health   Financial Resource Strain: Low Risk  (07/02/2022)   Overall Financial Resource Strain (CARDIA)    Difficulty of Paying Living Expenses: Not hard at all  Food Insecurity: No Food Insecurity (07/02/2022)   Hunger Vital Sign    Worried About Running Out of Food in the Last Year: Never true    Ran Out of Food in the Last Year: Never true   Transportation Needs: No Transportation Needs (07/02/2022)   PRAPARE - Administrator, Civil Service (Medical): No    Lack of Transportation (Non-Medical): No  Physical Activity: Inactive (07/02/2022)   Exercise Vital Sign    Days of Exercise per Week: 0 days    Minutes of Exercise per Session: 0 min  Stress: No Stress Concern Present (07/02/2022)   [name] of Occupational Health - Occupational Stress Questionnaire    Feeling of Stress : Only a little  Social Connections: Moderately Isolated (07/02/2022)   Social Connection and Isolation Panel [NHANES]    Frequency of Communication with Friends and Family: More than three times a week    Frequency of Social Gatherings with Friends and Family: Patient declined    Attends Religious Services: Never    Database administrator or Organizations: No    Attends Banker Meetings: Never    Marital Status: Married  Catering manager Violence: Not At Risk (04/23/2022)   Humiliation, Afraid, Rape, and Kick questionnaire    Fear of Current or Ex-Partner: No    Emotionally Abused: No    Physically Abused: No    Sexually Abused: No    Outpatient Medications Prior to Visit  Medication Sig Dispense Refill   albuterol (VENTOLIN HFA) 108 (90 Base) MCG/ACT inhaler Inhale 1-2 puffs into the lungs every 6 (six) hours as needed. 8 g 2   amLODipine (NORVASC) 5 MG tablet TAKE 1 TABLET (5 MG TOTAL) BY MOUTH DAILY. 90 tablet 1   Boswellia-Glucosamine-Vit D (OSTEO BI-FLEX ONE PER DAY PO) Take by mouth.     cetirizine (ZYRTEC) 10 MG tablet Take 10 mg by mouth daily.     clonazePAM (KLONOPIN) 1 MG tablet TAKE 1 TABLET BY MOUTH THREE TIMES A DAY AS NEEDED FOR ANXIETY 90 tablet 0   escitalopram (LEXAPRO) 10 MG tablet Take 1 tablet (10 mg total) by mouth daily.     fluticasone furoate-vilanterol (BREO ELLIPTA) 200-25 MCG/ACT AEPB Inhale 1 puff into the lungs daily. 1 each 5   gabapentin (NEURONTIN) 100 MG capsule TAKE 2 CAPSULES BY MOUTH 3  TIMES DAILY. 270 capsule 0   mirtazapine (REMERON) 45 MG tablet Take 45 mg by mouth at bedtime.      Misc Natural Products (NEURIVA PO) Take by mouth.     montelukast (SINGULAIR) 10 MG tablet TAKE 1 TABLET BY MOUTH EVERYDAY AT BEDTIME 90 tablet 1   Multiple Vitamin (MULTIVITAMIN) capsule Take  1 capsule by mouth daily. Dynamic brain     omeprazole (PRILOSEC) 40 MG capsule TAKE 1 CAPSULE (40 MG TOTAL) BY MOUTH IN THE MORNING AND AT BEDTIME. 180 capsule 1   ondansetron (ZOFRAN-ODT) 4 MG disintegrating tablet TAKE 1 TABLET BY MOUTH EVERY 8 HOURS AS NEEDED FOR NAUSEA AND VOMITING 30 tablet 0   traZODone (DESYREL) 50 MG tablet Take 50-150 mg by mouth at bedtime as needed for sleep.      benzonatate (TESSALON) 200 MG capsule Take 1 capsule (200 mg total) by mouth 3 (three) times daily as needed. 45 capsule 1   predniSONE (DELTASONE) 20 MG tablet Take 1 tablet (20 mg total) by mouth daily with breakfast. 5 tablet 0   No facility-administered medications prior to visit.    Allergies  Allergen Reactions   Compazine [Prochlorperazine]     hyperactivity   Morphine And Related Hives and Itching   Trintellix [Vortioxetine] Nausea And Vomiting    Review of Systems  Musculoskeletal:        (+) right knee pain       Objective:    Physical Exam Constitutional:      General: She is not in acute distress.    Appearance: Normal appearance.  HENT:     Head: Normocephalic and atraumatic.     Right Ear: External ear normal.     Left Ear: External ear normal.  Eyes:     Extraocular Movements: Extraocular movements intact.     Pupils: Pupils are equal, round, and reactive to light.  Cardiovascular:     Rate and Rhythm: Normal rate and regular rhythm.     Pulses: Normal pulses.     Heart sounds: Normal heart sounds. No murmur heard.    No gallop.  Pulmonary:     Effort: Pulmonary effort is normal. No respiratory distress.     Breath sounds: Normal breath sounds. No wheezing or rales.   Musculoskeletal:     Comments: (+) right knee tenderness upon palpation (-) right knee swelling  Skin:    General: Skin is warm.  Neurological:     Mental Status: She is alert and oriented to person, place, and time.  Psychiatric:        Judgment: Judgment normal.     BP (!) 139/48 (BP Location: Right Arm, Patient Position: Sitting, Cuff Size: Large)   Pulse 70   Temp 98.2 F (36.8 C) (Oral)   Resp 16   Wt 167 lb (75.8 kg)   LMP 03/29/1993 Comment: 12/25/13 Pt states last cycle has been about 20 yrs ago.  SpO2 93%   BMI 31.55 kg/m  Wt Readings from Last 3 Encounters:  07/05/22 167 lb (75.8 kg)  05/24/22 170 lb 3.2 oz (77.2 kg)  05/10/22 171 lb 6.4 oz (77.7 kg)       Assessment & Plan:  Acute pain of right knee -     Ambulatory referral to Orthopedics  Primary hypertension Assessment & Plan: BP Readings from Last 3 Encounters:  07/05/22 (!) 139/48  05/24/22 122/88  05/10/22 110/60   Stable on amlodipine. Continue same.    Hypertriglyceridemia Assessment & Plan: Lab Results  Component Value Date   CHOL 214 (H) 01/06/2022   HDL 53.90 01/06/2022   LDLCALC 128 (H) 01/06/2022   LDLDIRECT 110.0 12/30/2020   TRIG 159.0 (H) 01/06/2022   CHOLHDL 4 01/06/2022   Fair control on low cholesterol diet.    Anxiety and depression Assessment & Plan: Reports stable, followed by  psychiatry.       I, Lemont Fillers, NP, personally preformed the services described in this documentation.  All medical record entries made by the scribe were at my direction and in my presence.  I have reviewed the chart and discharge instructions (if applicable) and agree that the record reflects my personal performance and is accurate and complete. 07/05/2022  Lemont Fillers, NP  [facility] as a scribe for Lemont Fillers, NP.,have documented all relevant documentation on the behalf of Lemont Fillers, NP,as directed by  Lemont Fillers, NP while in  the presence of Lemont Fillers, NP.

## 2022-07-05 NOTE — Assessment & Plan Note (Signed)
BP Readings from Last 3 Encounters:  07/05/22 (!) 139/48  05/24/22 122/88  05/10/22 110/60   Stable on amlodipine. Continue same.

## 2022-07-06 DIAGNOSIS — G4733 Obstructive sleep apnea (adult) (pediatric): Secondary | ICD-10-CM | POA: Diagnosis not present

## 2022-07-06 NOTE — Procedures (Signed)
     Patient Name: [name], [name] Date: 07/05/2022 Gender: Female D.O.B: [birth year] Age (years): 70 Referring Provider: Babette Relic Parrett Height (inches): 62 Interpreting Physician: Coralyn Helling MD, ABSM Weight (lbs): 176 RPSGT: Lowry Ram BMI: 32 MRN: [id number] Neck Size: 14.50  CLINICAL INFORMATION The patient is referred for a BiPAP titration to treat sleep apnea.  Date of HST 04/13/22: AHI 47.2, SpO2 low 69%.  SLEEP STUDY TECHNIQUE As per the AASM Manual for the Scoring of Sleep and Associated Events v2.3 (April 2016) with a hypopnea requiring 4% desaturations.  The channels recorded and monitored were frontal, central and occipital EEG, electrooculogram (EOG), submentalis EMG (chin), nasal and oral airflow, thoracic and abdominal wall motion, anterior tibialis EMG, snore microphone, electrocardiogram, and pulse oximetry. Bilevel positive airway pressure (BPAP) was initiated at the beginning of the study and titrated to treat sleep-disordered breathing.  MEDICATIONS Medications self-administered by patient taken the night of the study : CLONAZEPAM, GABAPENTIN, MIRTAZAPINE, MONTELUKAST, OMEPRAZOLE, TRAZODONE, ZYRTEC  RESPIRATORY PARAMETERS Optimal IPAP Pressure (cm): 19 AHI at Optimal Pressure (/hr) 3.8 Optimal EPAP Pressure (cm): 15   Overall Minimal O2 (%): 61.0 Minimal O2 at Optimal Pressure (%): 86.0  She was tried on CPAP up to 15 cm H2O.  She continued to have obstructive respiratory events associated with oxygen desaturation.  This resolved after she was transitioned to Bipap.  SLEEP ARCHITECTURE Start Time: 10:17:18 PM Stop Time: 5:11:52 AM Total Time (min): 414.6 Total Sleep Time (min): 387.5 Sleep Latency (min): 14.1 Sleep Efficiency (%): 93.5% REM Latency (min): 269.0 WASO (min): 13.0 Stage N1 (%): 2.3% Stage N2 (%): 77.4% Stage N3 (%): 0.0% Stage R (%): 20.3 Supine (%): 44.00 Arousal Index (/hr): 15.3   CARDIAC DATA The 2 lead EKG demonstrated sinus  rhythm. The mean heart rate was 61.7 beats per minute. Other EKG findings include: PVCs.  LEG MOVEMENT DATA The total Periodic Limb Movements of Sleep (PLMS) were 0. The PLMS index was 0.0. A PLMS index of <15 is considered normal in adults.  IMPRESSIONS - CPAP failed to control her sleep disordered breathing. - She did well once transitioned to Bipap 19/15 cm H2O. - She did not require the use of supplemental oxygen during this study.  DIAGNOSIS - Obstructive Sleep Apnea (G47.33)  RECOMMENDATIONS - Trial of BiPAP therapy on 19/15 cm H2O with a Small size Resmed Full Face AirFit F20 mask and heated humidification. - Avoid alcohol, sedatives and other CNS depressants that may worsen sleep apnea and disrupt normal sleep architecture. - Sleep hygiene should be reviewed to assess factors that may improve sleep quality. - Weight management and regular exercise should be initiated or continued.  [Electronically signed] 07/06/2022 02:29 PM  Coralyn Helling MD, ABSM Diplomate, American Board of Sleep Medicine NPI: [id number]  [facility] PH: [phone]   FX: [phone] ACCREDITED BY THE AMERICAN ACADEMY OF SLEEP MEDICINE

## 2022-07-07 ENCOUNTER — Encounter: Payer: Self-pay | Admitting: Pulmonary Disease

## 2022-07-07 ENCOUNTER — Ambulatory Visit (INDEPENDENT_AMBULATORY_CARE_PROVIDER_SITE_OTHER): Payer: Medicare Other | Admitting: Pulmonary Disease

## 2022-07-07 VITALS — BP 126/76 | HR 64 | Temp 98.4°F | Ht 61.0 in | Wt 171.6 lb

## 2022-07-07 DIAGNOSIS — G4733 Obstructive sleep apnea (adult) (pediatric): Secondary | ICD-10-CM

## 2022-07-07 DIAGNOSIS — J189 Pneumonia, unspecified organism: Secondary | ICD-10-CM | POA: Diagnosis not present

## 2022-07-07 NOTE — Patient Instructions (Signed)
I will check with my colleagues if you are a candidate for the inspire device Based on that answer will either make a referral to meet the sleep doctor or order the BiPAP Will be in touch after I get more information Return to clinic in 6 months

## 2022-07-07 NOTE — Progress Notes (Unsigned)
[name]    [id number]    08-29-1950  Primary Care Physician:O'Sullivan, Efraim Kaufmann, NP  Referring Physician: Sandford Craze, NP [address] [place],  [place] [address]  Chief complaint: Chronic cough  HPI: 72 y.o. with history of hypertension, allergies, chronic headache, sacral radiculopathy with epidural fibrosis.  Complains of chronic cough for the past 3 months.  Treated with Z-Pak, Tessalon, prednisone taper with no improvement in March 2020, this was followed by Augmentin, albuterol inhaler and second round of prednisone and early April.  She was also evaluated in the ED on 07/10/2019 with chest x-ray showing minimal basal atelectasis.  Has constant irritation, congestion at the back of the throat.  Has uncontrolled rhinitis, postnasal drip and acid reflux for which she takes Flonase [which does not help much], Zyrtec, omeprazole once a day  She has history of severe allergies, rhinitis, postnasal drip.  She had sinus surgery at age 75 which resulted in a nasal perforation.   Pets: 4 cats.  She is allergic to cats Occupation: Retired Environmental health practitioner Exposures: Had mold in the previous home.  She moved out of this home in the year 2000.  No ongoing exposures with mold, hot tub, Jacuzzi.  No down pillows or comforters. Smoking history: 4-pack-year smoker.  Quit in 1990 Travel history: Originally from [place].  Previously lived in [place] Relevant family history: No significant family history of lung disease  Interval history: She developed community-acquired pneumonia in the right lung last month requiring amoxicillin and Z-Pak.  CT showed right lower lobe consolidation which will need follow-up. Overall she is improved but continues to have some persistent cough, congestion.  Outpatient Encounter Medications as of 07/07/2022  Medication Sig   albuterol (VENTOLIN HFA) 108 (90 Base) MCG/ACT inhaler Inhale 1-2 puffs into the lungs every 6  (six) hours as needed.   amLODipine (NORVASC) 5 MG tablet TAKE 1 TABLET (5 MG TOTAL) BY MOUTH DAILY.   cetirizine (ZYRTEC) 10 MG tablet Take 10 mg by mouth daily.   clonazePAM (KLONOPIN) 1 MG tablet TAKE 1 TABLET BY MOUTH THREE TIMES A DAY AS NEEDED FOR ANXIETY   escitalopram (LEXAPRO) 10 MG tablet Take 1 tablet (10 mg total) by mouth daily.   fluticasone furoate-vilanterol (BREO ELLIPTA) 200-25 MCG/ACT AEPB Inhale 1 puff into the lungs daily.   gabapentin (NEURONTIN) 100 MG capsule TAKE 2 CAPSULES BY MOUTH 3 TIMES DAILY.   mirtazapine (REMERON) 45 MG tablet Take 45 mg by mouth at bedtime.    Misc Natural Products (NEURIVA PO) Take by mouth.   montelukast (SINGULAIR) 10 MG tablet TAKE 1 TABLET BY MOUTH EVERYDAY AT BEDTIME   Multiple Vitamin (MULTIVITAMIN) capsule Take 1 capsule by mouth daily. Dynamic brain   omeprazole (PRILOSEC) 40 MG capsule TAKE 1 CAPSULE (40 MG TOTAL) BY MOUTH IN THE MORNING AND AT BEDTIME.   ondansetron (ZOFRAN-ODT) 4 MG disintegrating tablet TAKE 1 TABLET BY MOUTH EVERY 8 HOURS AS NEEDED FOR NAUSEA AND VOMITING   traZODone (DESYREL) 50 MG tablet Take 50-150 mg by mouth at bedtime as needed for sleep.    [DISCONTINUED] Boswellia-Glucosamine-Vit D (OSTEO BI-FLEX ONE PER DAY PO) Take by mouth.   No facility-administered encounter medications on file as of 07/07/2022.   Physical Exam: Blood pressure 130/76, pulse 70, temperature 98.1 F (36.7 C), temperature source Oral, height 5' 1.5" (1.562 m), weight 167 lb 9.6 oz (76 kg), last menstrual period 03/29/1993, SpO2 97 %. Gen:  No acute distress HEENT:  EOMI, sclera anicteric Neck:     No masses; no thyromegaly Lungs:    Clear to auscultation bilaterally; normal respiratory effort CV:         Regular rate and rhythm; no murmurs Abd:      + bowel sounds; soft, non-tender; no palpable masses, no distension Ext:    No edema; adequate peripheral perfusion Skin:      Warm and dry; no rash Neuro: alert and oriented x  3 Psych: normal mood and affect   Data Reviewed: Imaging: Chest x-ray 07/10/2019-minimal left basilar scarring/atelectasis.    High-resolution CT 08/17/2019-no evidence of ILD, mild scarring at the lung base-postinfectious, coronary artery disease, aortic atherosclerosis.  CT chest 02/12/2022-small right effusion, irregular masslike consolidation in the right lower lobe, small pericardial effusion  PFTs 07/25/2019 FVC 2.84 (102%), FEV1 2.42 (115%), F/F 85, TLC 5.19 [110%], DLCO 16.91 [94%] Normal test  Labs: Respiratory allergy profile 5/40/21-IgE 25, RAST panel-negative CBC 07/31/2019-WBC 9.3, eos 4%, absolute eosinophil count 372  Assessment:  Right lower lobe pneumonia She is making slow improvements after treatment with antibiotics as an outpatient Obtain chest x-ray today and a follow-up CT in 1 month  Chronic cough Likely upper airway cough from postnasal drip, rhinitis, GERD. May have mild reactive airway disease given PFTs with no overt obstruction but improvement in mid flow rates post bronchodilators and mildly elevated peripheral eosinophils. CT reviewed with no significant ILD Continue Flonase, Singulair, antihistamines over-the-counter. Not on any inhalers at present  Evaluated by ENT in 2021 who recommended allergy consult.  Plan/Recommendations: Chest x-ray Follow-up CT scan in 1 month  Chilton Greathouse MD [facility] 07/07/2022, 3:40 PM  CC: Sandford Craze, NP

## 2022-07-12 ENCOUNTER — Encounter: Payer: Self-pay | Admitting: *Deleted

## 2022-07-13 IMAGING — MG MM DIGITAL SCREENING BILAT W/ TOMO AND CAD
8 series · 8 of 24 positions shown · non-contrast
Comparison: Previous exam(s).

ACR Breast Density Category a: The breast tissue is almost entirely
fatty.

CLINICAL DATA: Screening.

EXAM:
DIGITAL SCREENING BILATERAL MAMMOGRAM WITH TOMOSYNTHESIS AND CAD
TECHNIQUE: Bilateral screening digital craniocaudal and mediolateral oblique
mammograms were obtained. Bilateral screening digital breast
tomosynthesis was performed. The images were evaluated with
computer-aided detection.

[R CC synth-2D]
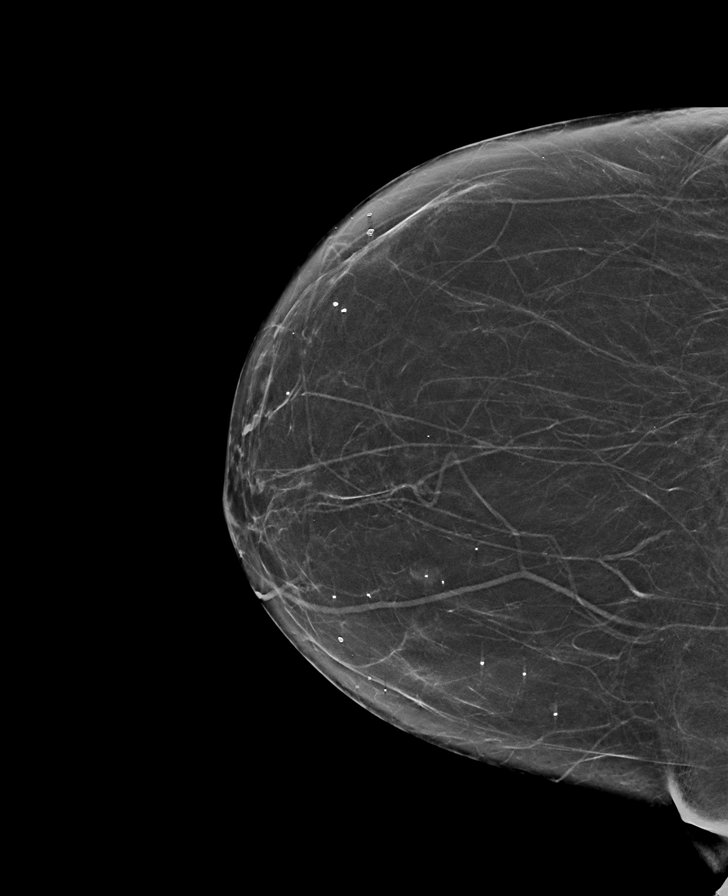

[L CC synth-2D]
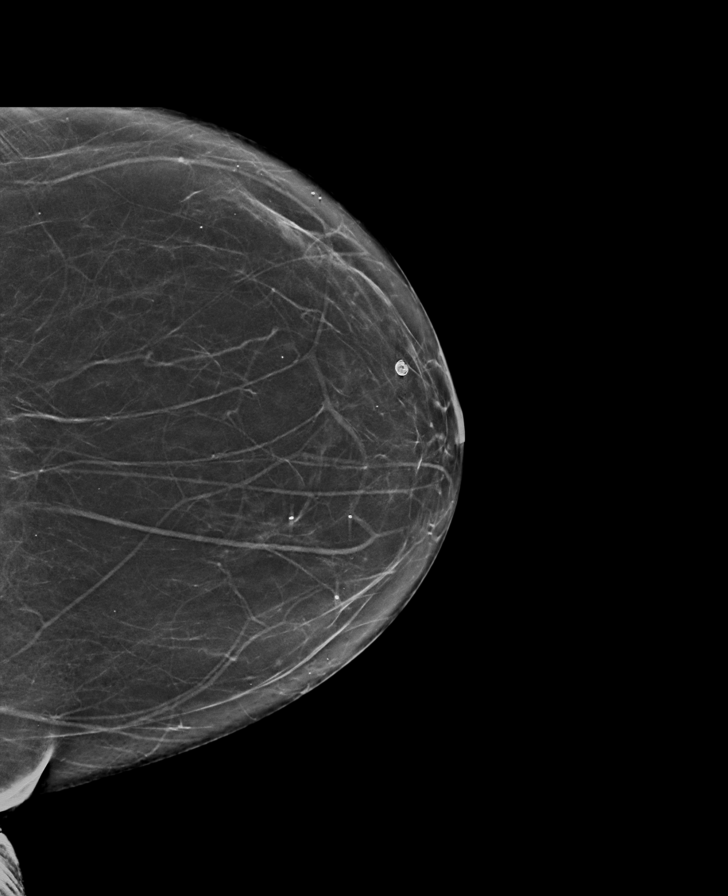

[L MLO synth-2D]
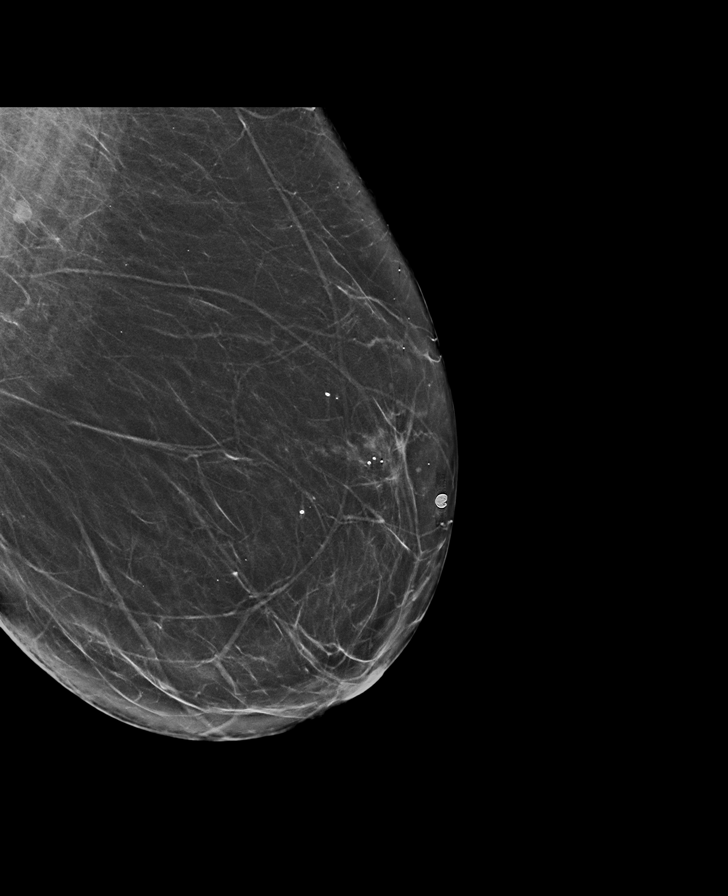

[R MLO synth-2D]
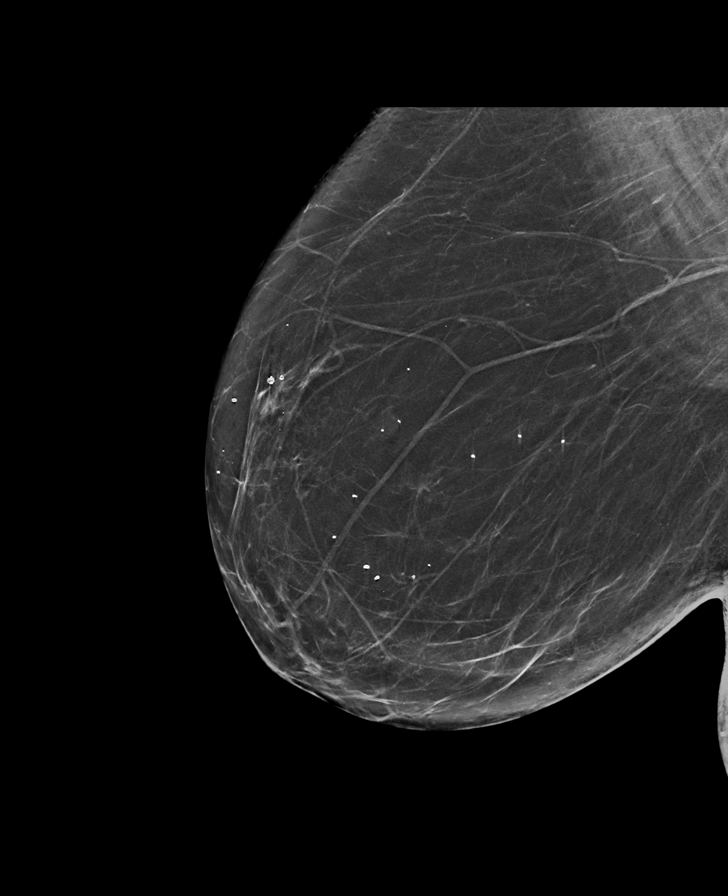

[R MLO tomo · tomo slice 33/66.0]
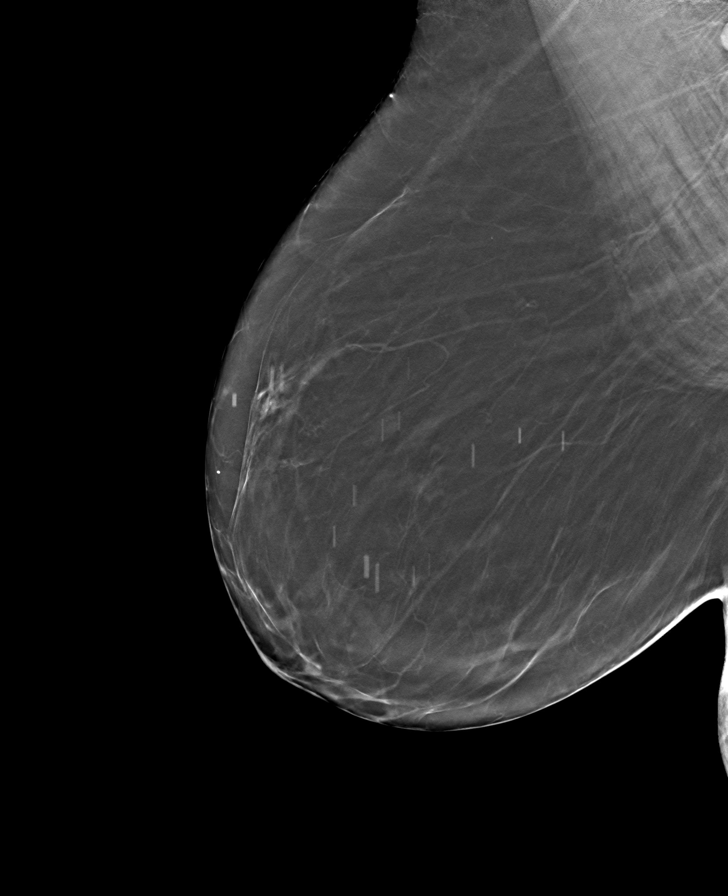

[L CC tomo · tomo slice 33/65.0]
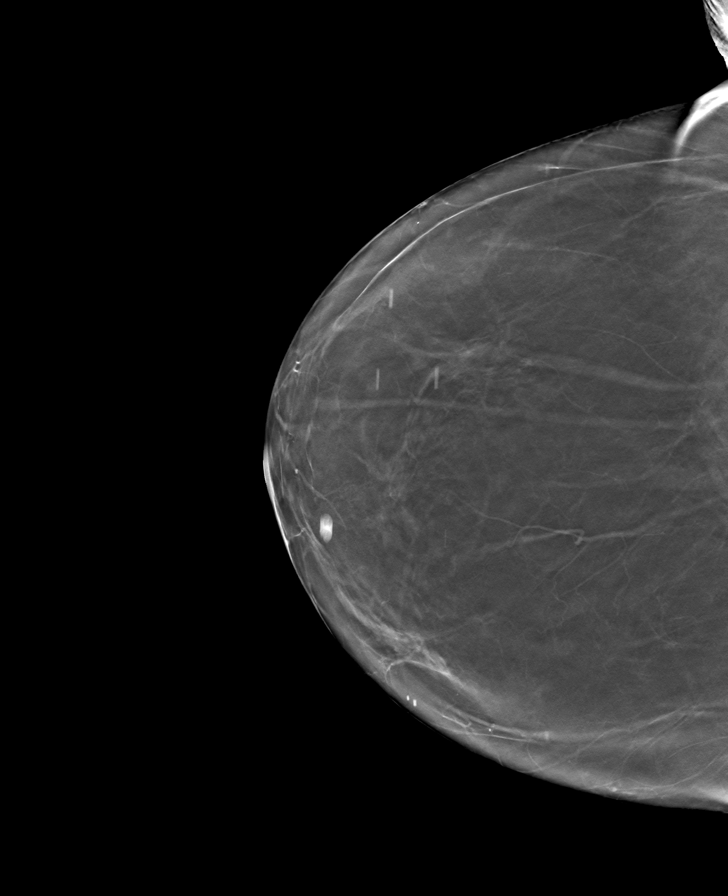

[R CC tomo · tomo slice 30/59.0]
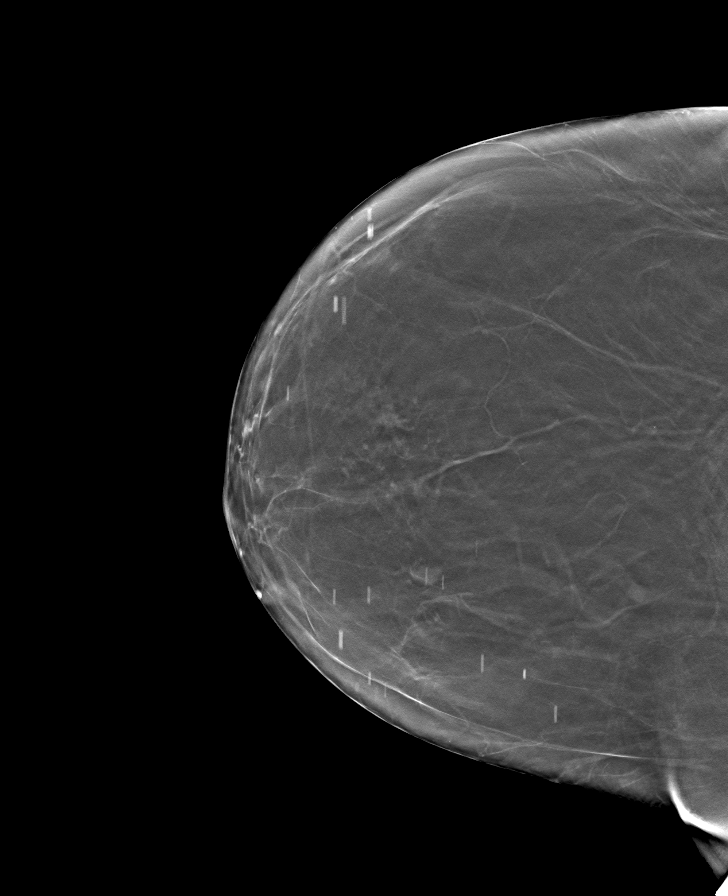

[L MLO tomo · tomo slice 35/70.0]
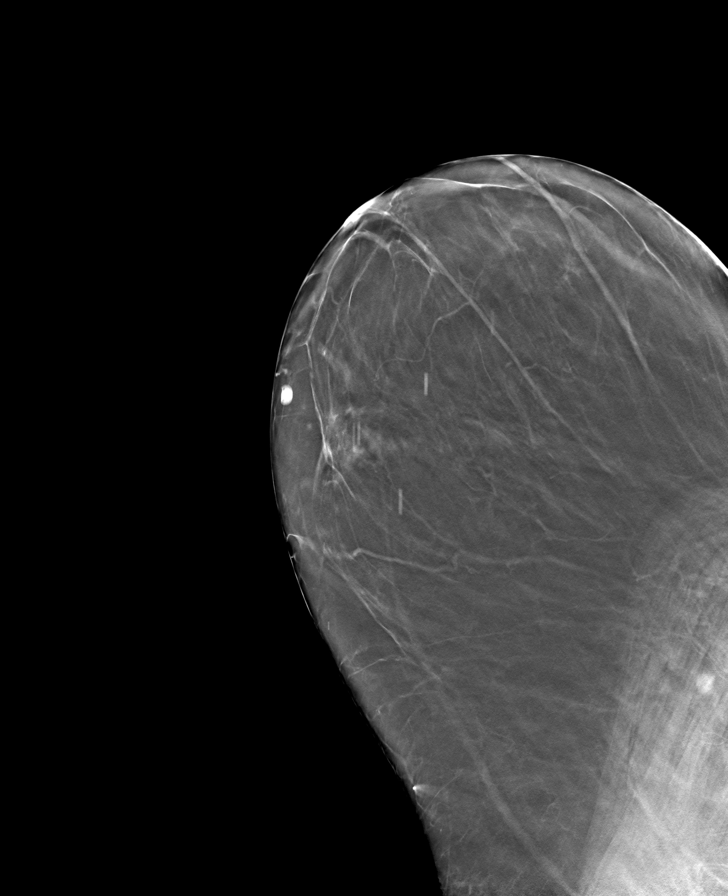

[8 of 24 positions shown; findings below may reference images not displayed]

FINDINGS: There are no findings suspicious for malignancy.
IMPRESSION: No mammographic evidence of malignancy. A result letter of this
screening mammogram will be mailed directly to the patient.

RECOMMENDATION:
Screening mammogram in one year. (Code:0E-[id number])

BI-RADS CATEGORY  1: Negative.

## 2022-07-14 ENCOUNTER — Other Ambulatory Visit (INDEPENDENT_AMBULATORY_CARE_PROVIDER_SITE_OTHER): Payer: Medicare Other

## 2022-07-14 ENCOUNTER — Encounter: Payer: Self-pay | Admitting: Orthopedic Surgery

## 2022-07-14 ENCOUNTER — Ambulatory Visit: Payer: Medicare Other | Admitting: Orthopedic Surgery

## 2022-07-14 DIAGNOSIS — M25562 Pain in left knee: Secondary | ICD-10-CM

## 2022-07-14 DIAGNOSIS — M25561 Pain in right knee: Secondary | ICD-10-CM | POA: Diagnosis not present

## 2022-07-14 NOTE — Progress Notes (Signed)
Office Visit Note   Patient: [name]           Date of Birth: 07-05-1950           MRN: [id number] Visit Date: 07/14/2022 Requested by: Sandford Craze, NP [address] [place],  [place] [address] PCP: Sandford Craze, NP  Subjective: Chief Complaint  Patient presents with   Right Knee - Pain    HPI: [name] is a 72 y.o. female who presents to the office reporting bilateral knee pain right worse than left.  Denies any history of injury.  The pain is relatively constant but moves around the knee.  Pain does not wake her from sleep at night.  She does report some swelling.  No weakness giving way or locking but does report some popping.  Symptoms ongoing for 4 weeks.  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  Ibuprofen is mildly helpful.  No prior knee surgery.  Takes gabapentin for her spinal issues and that does give her occasional relief of her knee pain..                ROS: All systems reviewed are negative as they relate to the chief complaint within the history of present illness.  Patient denies fevers or chills.  Assessment & Plan: Visit Diagnoses:  1. Right knee pain, unspecified chronicity   2. Left knee pain, unspecified chronicity     Plan: Impression is bilateral patellofemoral arthritis right worse than left.  Some this could be referred pain from the back as well.  We talked about injection today but she was to hold off on that.  Medial and lateral joint spaces are okay so I think she is fine to walk but on flat ground surfaces just to help her back.  She will come back if her symptoms increase to the point where she wants to consider injection.  Follow-Up Instructions: No follow-ups on file.   Orders:  Orders Placed This Encounter  Procedures   XR KNEE 3 VIEW RIGHT   XR KNEE 3 VIEW LEFT   No orders of the defined types were placed in this encounter.      Procedures: No procedures performed   Clinical Data: No additional findings.  Objective: Vital Signs: LMP 03/29/1993 Comment: 12/25/13 Pt states last cycle has been about 20 yrs ago.  Physical Exam:  Constitutional: Patient appears well-developed HEENT:  Head: Normocephalic Eyes:EOM are normal Neck: Normal range of motion Cardiovascular: Normal rate Pulmonary/chest: Effort normal Neurologic: Patient is alert Skin: Skin is warm Psychiatric: Patient has normal mood and affect  Ortho Exam: Ortho exam demonstrates normal gait and alignment.  Pedal pulses palpable.  No groin pain with internal or external rotation of either leg.  No masses lymphadenopathy or skin changes noted in either knee region.  She has patellofemoral crepitus right worse than left.  Trace effusion present.  No medial or lateral joint line tenderness on either knee.  Specialty Comments:  No specialty comments available.  Imaging: No results found.   PMFS History: Patient Active Problem List   Diagnosis Date Noted   Acute pain of right knee 07/05/2022   Mild intermittent acute asthmatic bronchitis 05/24/2022   Lung nodules 05/10/2022   OSA (obstructive sleep apnea) 05/10/2022   Snoring 03/11/2022   Obesity (BMI 30.0-34.9) 07/07/2021   Effusion of joint of left shoulder 04/21/2021   Preventative health  care 12/30/2020   Thrombocytopenia 01/09/2020   Hypertension    Hypertriglyceridemia 06/15/2015   Allergic rhinitis 10/16/2014   GERD (gastroesophageal reflux disease) 10/16/2014   Reactive airway disease 09/11/2014   Chronic nausea 12/25/2013   Anxiety and depression 12/25/2013   Arachnoiditis    Umbilical hernia 08/09/2011   Past Medical History:  Diagnosis Date   AKI (acute kidney injury) 01/09/2020   Allergy    seasonal   Anxiety    Arachnoiditis    Cataract    Depression    Elevated liver enzymes    Environmental allergies    GERD (gastroesophageal reflux disease)    H/O being  hospitalized 03/2012   x1 week for nausea   History of chicken pox    Hyperlipidemia    Hypertension    Migraine with typical aura    resolved years ago   Nausea    chronic nausea    Neuromuscular disorder    Pneumonia 03/02/2022    Family History  Problem Relation Age of Onset   Alzheimer's disease Mother    Hypertension Mother    Migraines Mother    Hypertension Father    Other Father        ?carotid artery aneurysm   Cancer Brother        mouth and throat   Colon cancer Neg Hx     Past Surgical History:  Procedure Laterality Date   ABDOMINOPLASTY  07/11/09   EUS N/A 05/05/2012   Procedure: UPPER ENDOSCOPIC ULTRASOUND (EUS) LINEAR;  Surgeon: Theda Belfast, MD;  Location: [facility];  Service: Endoscopy;  Laterality: N/A;   LAPAROSCOPIC CHOLECYSTECTOMY  2010   NASAL SINUS SURGERY     "Has had 2-3 surgeries over the years"   UMBILICAL HERNIA REPAIR N/A 10/06/2020   Procedure: UMBILICAL HERNIA REPAIR;  Surgeon: Abigail Miyamoto, MD;  Location: [facility];  Service: General;  Laterality: N/A;  LMA ANESTHESIA   Social History   Occupational History   Not on file  Tobacco Use   Smoking status: Former    Packs/day: 0.25    Years: 8.00    Additional pack years: 0.00    Total pack years: 2.00    Types: Cigarettes    Quit date: 08/09/1986    Years since quitting: 35.9   Smokeless tobacco: Never  Vaping Use   Vaping Use: Never used  Substance and Sexual Activity   Alcohol use: No   Drug use: No   Sexual activity: Not Currently    Partners: Male    Birth control/protection: Other-see comments, Post-menopausal    Comment: husband had vasectomy.

## 2022-07-15 ENCOUNTER — Other Ambulatory Visit: Payer: Self-pay | Admitting: Family

## 2022-07-16 MED ORDER — GABAPENTIN 100 MG PO CAPS: 200.0000 mg | ORAL_CAPSULE | Freq: Three times a day (TID) | ORAL | 0 refills | Status: DC

## 2022-07-20 ENCOUNTER — Ambulatory Visit: Payer: Medicare Other | Admitting: Adult Health

## 2022-07-20 ENCOUNTER — Encounter: Payer: Self-pay | Admitting: Adult Health

## 2022-07-20 VITALS — BP 110/70 | HR 71 | Temp 97.4°F | Ht 61.5 in | Wt 170.6 lb

## 2022-07-20 DIAGNOSIS — G4733 Obstructive sleep apnea (adult) (pediatric): Secondary | ICD-10-CM | POA: Diagnosis not present

## 2022-07-20 DIAGNOSIS — J309 Allergic rhinitis, unspecified: Secondary | ICD-10-CM

## 2022-07-20 DIAGNOSIS — J4520 Mild intermittent asthma, uncomplicated: Secondary | ICD-10-CM | POA: Diagnosis not present

## 2022-07-20 NOTE — Progress Notes (Signed)
@Patient  ID: Elisha Ponder, female    DOB: [birth year], 72 y.o.   MRN: [id number]  Chief Complaint  Patient presents with   Follow-up    Referring provider: Sandford Craze, NP  HPI: 72 year old female former smoker followed for chronic cough, pneumonia and sleep apnea  TEST/EVENTS :  Pets: 4 cats.  She is allergic to cats Occupation: Retired Environmental health practitioner Exposures: Had mold in the previous home.  She moved out of this home in the year 2000.  No ongoing exposures with mold, hot tub, Jacuzzi.  No down pillows or comforters. Smoking history: 4-pack-year smoker.  Quit in 1990 Travel history: Originally from [place].  Previously lived in [place] Relevant family history: No significant family history of lung disease   Chest x-ray 07/10/2019-minimal left basilar scarring/atelectasis.     High-resolution CT 08/17/2019-no evidence of ILD, mild scarring at the lung base-postinfectious, coronary artery disease, aortic atherosclerosis.   CT chest on February 11, 2022 showed a small right-sided pleural effusion and irregular masslike consolidation in the right lower lobe. Surrounding groundglass density and irregular consolidation    CT chest March 09, 2022 showed decrease in masslike consolidation in the right lower lobe. Decreased nodular airspace. With new right middle lobe and right upper lobe and left upper lobe nodularity. She was treated with a 7-day course of Augmentin.    CT chest done on May 04, 2022 showed near complete resolution of the right lower lobe masslike consolidation with some residual linear opacities consistent with postinfectious scarring. There were some small new clustered solid nodules in the right upper lobe measuring 4 mm. Concern for possible aspiration. And a new linear opacity in the right lower lobe likely sequela of interval aspiration/infection. Resolution of groundglass nodules.    modified barium swallow done on March 30, 2022.  That  showed normal oral and pharyngeal phase of swallow with no aspiration.  The to have mild dysphagia -pharyngoesophageal phase.     Home sleep study that was done on April 13, 2022 that showed severe sleep apnea with a AHI of 47.2/hour and SpO2 low at 69%, average O2 saturation was 84%.    PFTs 07/25/2019 FVC 2.84 (102%), FEV1 2.42 (115%), F/F 85, TLC 5.19 [110%], DLCO 16.91 [94%] Normal test   Labs: Respiratory allergy profile 5/40/21-IgE 25, RAST panel-negative CBC 07/31/2019-WBC 9.3, eos 4%, absolute eosinophil count 372  07/20/2022 Follow up : OSA and Pneumonia  Patient presents for a 2-week follow-up.  Patient had previously been dealing with a recurrent bronchitis and pneumonia since October 2023.  She was treated with multiple antibiotics.  Subsequent CT chest February 11, 2022 showed a small right-sided pleural effusion and consolidation in the right lower lobe..  Follow-up CT chest March 09, 2022 showed a decrease in the masslike consolidation in the right lower lobe but a new right middle lobe and right upper lobe and left upper lobe nodularity.  She was set up for a modified barium swallow done March 30, 2022 that showed mild dysphagia.  Follow-up CT chest May 04, 2022 showed near complete interval resolution of the right lower lobe masslike consolidation. Patient is feeling much better and cough has resolved.   Patient was found to have severe obstructive sleep apnea April 13, 2022 on home sleep study.  AHI was 47.2/hour and SpO2 low at 69%.  She was set up for CPAP titration study that showed optimal control on BiPAP 19/15. We discussed her sleep study results and went over treatment options.  Patient said  that the night of the titration study that she slept very well and felt that BiPAP really helped her quite a bit.    Allergies  Allergen Reactions   Compazine [Prochlorperazine]     hyperactivity   Morphine And Related Hives and Itching   Trintellix [Vortioxetine]  Nausea And Vomiting    Immunization History  Administered Date(s) Administered   COVID-19, mRNA, vaccine(Comirnaty)12 years and older 12/27/2021   Fluad Quad(high Dose 65+) 01/01/2021, 12/19/2021   Influenza, High Dose Seasonal PF 01/09/2016, 12/15/2016, 12/14/2017, 12/01/2018   Influenza,inj,Quad PF,6+ Mos 12/25/2013, 12/13/2014   Influenza-Unspecified 12/05/2019, 12/19/2021   Moderna Sars-Covid-2 Vaccination 12/19/2021   PFIZER(Purple Top)SARS-COV-2 Vaccination 05/03/2019, 05/24/2019, 12/05/2019   Pfizer Covid-19 Vaccine Bivalent Booster 33yrs & up 01/17/2021   Pneumococcal Conjugate-13 02/17/2018   Pneumococcal Polysaccharide-23 12/13/2014, 03/02/2022   RSV,unspecified 12/19/2021   Td 06/06/2008   Zoster Recombinat (Shingrix) 12/11/2018, 03/26/2019   Zoster, Live 01/15/2011    Past Medical History:  Diagnosis Date   AKI (acute kidney injury) 01/09/2020   Allergy    seasonal   Anxiety    Arachnoiditis    Cataract    Depression    Elevated liver enzymes    Environmental allergies    GERD (gastroesophageal reflux disease)    H/O being hospitalized 03/2012   x1 week for nausea   History of chicken pox    Hyperlipidemia    Hypertension    Migraine with typical aura    resolved years ago   Nausea    chronic nausea    Neuromuscular disorder    Pneumonia 03/02/2022    Tobacco History: Social History   Tobacco Use  Smoking Status Former   Packs/day: 0.25   Years: 8.00   Additional pack years: 0.00   Total pack years: 2.00   Types: Cigarettes   Quit date: 08/09/1986   Years since quitting: 35.9  Smokeless Tobacco Never   Counseling given: Not Answered   Outpatient Medications Prior to Visit  Medication Sig Dispense Refill   amLODipine (NORVASC) 5 MG tablet TAKE 1 TABLET (5 MG TOTAL) BY MOUTH DAILY. 90 tablet 1   cetirizine (ZYRTEC) 10 MG tablet Take 10 mg by mouth daily.     clonazePAM (KLONOPIN) 1 MG tablet TAKE 1 TABLET BY MOUTH THREE TIMES A DAY AS NEEDED  FOR ANXIETY 90 tablet 0   escitalopram (LEXAPRO) 10 MG tablet Take 1 tablet (10 mg total) by mouth daily.     gabapentin (NEURONTIN) 100 MG capsule Take 2 capsules (200 mg total) by mouth 3 (three) times daily. 270 capsule 0   mirtazapine (REMERON) 45 MG tablet Take 45 mg by mouth at bedtime.      Misc Natural Products (NEURIVA PO) Take by mouth.     montelukast (SINGULAIR) 10 MG tablet TAKE 1 TABLET BY MOUTH EVERYDAY AT BEDTIME 90 tablet 1   Multiple Vitamin (MULTIVITAMIN) capsule Take 1 capsule by mouth daily. Dynamic brain     omeprazole (PRILOSEC) 40 MG capsule TAKE 1 CAPSULE (40 MG TOTAL) BY MOUTH IN THE MORNING AND AT BEDTIME. 180 capsule 1   ondansetron (ZOFRAN-ODT) 4 MG disintegrating tablet TAKE 1 TABLET BY MOUTH EVERY 8 HOURS AS NEEDED FOR NAUSEA AND VOMITING 30 tablet 0   traZODone (DESYREL) 50 MG tablet Take 50-150 mg by mouth at bedtime as needed for sleep.      albuterol (VENTOLIN HFA) 108 (90 Base) MCG/ACT inhaler Inhale 1-2 puffs into the lungs every 6 (six) hours as needed. (Patient not  taking: Reported on 07/20/2022) 8 g 2   fluticasone furoate-vilanterol (BREO ELLIPTA) 200-25 MCG/ACT AEPB Inhale 1 puff into the lungs daily. (Patient not taking: Reported on 07/20/2022) 1 each 5   No facility-administered medications prior to visit.     Review of Systems:   Constitutional:   No  weight loss, night sweats,  Fevers, chills,  +fatigue, or  lassitude.  HEENT:   No headaches,  Difficulty swallowing,  Tooth/dental problems, or  Sore throat,                No sneezing, itching, ear ache, nasal congestion, post nasal drip,   CV:  No chest pain,  Orthopnea, PND, swelling in lower extremities, anasarca, dizziness, palpitations, syncope.   GI  No heartburn, indigestion, abdominal pain, nausea, vomiting, diarrhea, change in bowel habits, loss of appetite, bloody stools.   Resp: No shortness of breath with exertion or at rest.  No excess mucus, no productive cough,  No non-productive  cough,  No coughing up of blood.  No change in color of mucus.  No wheezing.  No chest wall deformity  Skin: no rash or lesions.  GU: no dysuria, change in color of urine, no urgency or frequency.  No flank pain, no hematuria   MS:  No joint pain or swelling.  No decreased range of motion.  No back pain.    Physical Exam  BP 110/70 (BP Location: Right Arm, Patient Position: Sitting, Cuff Size: Large)   Pulse 71   Temp (!) 97.4 F (36.3 C) (Oral)   Ht 5' 1.5" (1.562 m)   Wt 170 lb 9.6 oz (77.4 kg)   LMP 03/29/1993 Comment: 12/25/13 Pt states last cycle has been about 20 yrs ago.  SpO2 93% Comment: up to 95% with forehead probe  BMI 31.71 kg/m   GEN: A/Ox3; pleasant , NAD, well nourished    HEENT:  [place]/AT,   NOSE-clear, THROAT-clear, no lesions, no postnasal drip or exudate noted.   NECK:  Supple w/ fair ROM; no JVD; normal carotid impulses w/o bruits; no thyromegaly or nodules palpated; no lymphadenopathy.    RESP  Clear  P & A; w/o, wheezes/ rales/ or rhonchi. no accessory muscle use, no dullness to percussion  CARD:  RRR, no m/r/g, no peripheral edema, pulses intact, no cyanosis or clubbing.  GI:   Soft & nt; nml bowel sounds; no organomegaly or masses detected.   Musco: Warm bil, no deformities or joint swelling noted.   Neuro: alert, no focal deficits noted.    Skin: Warm, no lesions or rashes    Lab Results:  CBC    Component Value Date/Time   WBC 8.7 03/11/2022 1124   RBC 4.44 03/11/2022 1124   HGB 13.5 03/11/2022 1124   HCT 40.0 03/11/2022 1124   PLT 155.0 03/11/2022 1124   MCV 90.0 03/11/2022 1124   MCH 29.8 01/18/2020 1129   MCHC 33.8 03/11/2022 1124   RDW 14.4 03/11/2022 1124   LYMPHSABS 2.2 03/11/2022 1124   MONOABS 0.6 03/11/2022 1124   EOSABS 0.5 03/11/2022 1124   BASOSABS 0.1 03/11/2022 1124    BMET    Component Value Date/Time   NA 139 01/06/2022 1110   K 4.0 01/06/2022 1110   CL 106 01/06/2022 1110   CO2 25 01/06/2022 1110   GLUCOSE  86 01/06/2022 1110   BUN 19 01/06/2022 1110   CREATININE 0.98 01/06/2022 1110   CREATININE 0.93 02/28/2020 1254   CALCIUM 9.1 01/06/2022 1110  GFRNONAA 63 02/28/2020 1254   GFRAA 73 02/28/2020 1254    BNP No results found for: "BNP"  ProBNP No results found for: "PROBNP"  Imaging: XR KNEE 3 VIEW LEFT  Result Date: 07/14/2022 AP lateral merchant radiographs left knee.  No acute fracture.  Medial and lateral compartments of no arthritis.  Patellofemoral arthritis is moderate.  Alignment intact.  XR KNEE 3 VIEW RIGHT  Result Date: 07/14/2022 AP lateral merchant radiographs right knee reviewed.  Minimal to no degenerative changes in the medial and lateral joint spaces.  Moderate patellofemoral arthritis is present.  Alignment intact.  No acute fracture.  SLEEP STUDY DOCUMENTS  Result Date: 07/07/2022 Ordered by an unspecified provider.        Latest Ref Rng & Units 07/25/2019   12:41 PM  PFT Results  FVC-Pre L 2.64   FVC-Predicted Pre % 95   FVC-Post L 2.84   FVC-Predicted Post % 102   Pre FEV1/FVC % % 83   Post FEV1/FCV % % 85   FEV1-Pre L 2.18   FEV1-Predicted Pre % 103   FEV1-Post L 2.42   DLCO uncorrected ml/min/mmHg 16.91   DLCO UNC% % 94   DLVA Predicted % 84   TLC L 5.19   TLC % Predicted % 110   RV % Predicted % 107     No results found for: "NITRICOXIDE"      Assessment & Plan:   OSA (obstructive sleep apnea) Severe obstructive sleep apnea-sleep titration study showed optimal control on BiPAP.  Will begin auto BiPAP IPAP max 19 cm H2O.  And EPAP minimum 15 cm H2O. Patient education given on sleep apnea and BiPAP care  Plan  Patient Instructions  Begin BIPAP At bedtime  , wear all night long, Goal is at least 6hr or more.  Work on healthy weight loss  Do not drive If sleepy   Albuterol inhaler .As needed    Continue on Omeprazole 40mg  Twice daily  before meals .  Aspiration precautions  GERD diet.  Sleep at incline - wedge pillow .   Follow up with GI as discussed.   Singulair daily.  Zyrtec 10mg  At bedtime.  Saline nasal spray Twice daily   Saline nasal gel. At bedtime   Flonase 2 puffs daily As needed   Albuterol inhaler As needed    Follow up with Dr Isaiah Serge or Cira Deyoe NP in 2 months and As needed      Reactive airway disease Appears well-controlled.  Continue on trigger prevention.  Albuterol as needed  Plan  Patient Instructions  Begin BIPAP At bedtime  , wear all night long, Goal is at least 6hr or more.  Work on healthy weight loss  Do not drive If sleepy   Albuterol inhaler .As needed    Continue on Omeprazole 40mg  Twice daily  before meals .  Aspiration precautions  GERD diet.  Sleep at incline - wedge pillow .  Follow up with GI as discussed.   Singulair daily.  Zyrtec 10mg  At bedtime.  Saline nasal spray Twice daily   Saline nasal gel. At bedtime   Flonase 2 puffs daily As needed   Albuterol inhaler As needed    Follow up with Dr Isaiah Serge or Grover Robinson NP in 2 months and As needed      Allergic rhinitis Controlled on current regimen.     Rubye Oaks, NP 07/20/2022

## 2022-07-20 NOTE — Patient Instructions (Addendum)
Begin BIPAP At bedtime  , wear all night long, Goal is at least 6hr or more.  Work on healthy weight loss  Do not drive If sleepy   Albuterol inhaler .As needed    Continue on Omeprazole  Twice daily  before meals .  Aspiration precautions  GERD diet.  Sleep at incline - wedge pillow .  Follow up with GI as discussed.   Singulair daily.  Zyrtec  At bedtime.  Saline nasal spray Twice daily   Saline nasal gel. At bedtime   Flonase 2 puffs daily As needed   Albuterol inhaler As needed    Follow up with Dr Isaiah Serge or Ipek Westra NP in 2 months and As needed

## 2022-07-20 NOTE — Assessment & Plan Note (Signed)
Controlled on current regimen.   

## 2022-07-20 NOTE — Assessment & Plan Note (Signed)
Appears well-controlled.  Continue on trigger prevention.  Albuterol as needed  Plan  Patient Instructions  Begin BIPAP At bedtime  , wear all night long, Goal is at least 6hr or more.  Work on healthy weight loss  Do not drive If sleepy   Albuterol inhaler .As needed    Continue on Omeprazole  Twice daily  before meals .  Aspiration precautions  GERD diet.  Sleep at incline - wedge pillow .  Follow up with GI as discussed.   Singulair daily.  Zyrtec  At bedtime.  Saline nasal spray Twice daily   Saline nasal gel. At bedtime   Flonase 2 puffs daily As needed   Albuterol inhaler As needed    Follow up with Dr Isaiah Serge or Castin Donaghue NP in 2 months and As needed

## 2022-07-20 NOTE — Assessment & Plan Note (Signed)
Severe obstructive sleep apnea-sleep titration study showed optimal control on BiPAP.  Will begin auto BiPAP IPAP max 19 cm H2O.  And EPAP minimum 15 cm H2O. Patient education given on sleep apnea and BiPAP care  Plan  Patient Instructions  Begin BIPAP At bedtime  , wear all night long, Goal is at least 6hr or more.  Work on healthy weight loss  Do not drive If sleepy   Albuterol inhaler .As needed    Continue on Omeprazole  Twice daily  before meals .  Aspiration precautions  GERD diet.  Sleep at incline - wedge pillow .  Follow up with GI as discussed.   Singulair daily.  Zyrtec  At bedtime.  Saline nasal spray Twice daily   Saline nasal gel. At bedtime   Flonase 2 puffs daily As needed   Albuterol inhaler As needed    Follow up with Dr Isaiah Serge or Lasonia Casino NP in 2 months and As needed

## 2022-07-21 NOTE — Progress Notes (Signed)
Reviewed and agree with assessment/plan.   Neesa Knapik, MD [facility] 07/21/2022, 9:03 AM Pager:  [phone]  

## 2022-07-26 ENCOUNTER — Ambulatory Visit: Payer: Medicare Other | Admitting: Gastroenterology

## 2022-07-26 ENCOUNTER — Encounter: Payer: Self-pay | Admitting: Gastroenterology

## 2022-07-26 VITALS — BP 122/72 | HR 68 | Ht 62.0 in | Wt 171.0 lb

## 2022-07-26 DIAGNOSIS — K760 Fatty (change of) liver, not elsewhere classified: Secondary | ICD-10-CM

## 2022-07-26 DIAGNOSIS — K219 Gastro-esophageal reflux disease without esophagitis: Secondary | ICD-10-CM

## 2022-07-26 DIAGNOSIS — K449 Diaphragmatic hernia without obstruction or gangrene: Secondary | ICD-10-CM | POA: Diagnosis not present

## 2022-07-26 DIAGNOSIS — R109 Unspecified abdominal pain: Secondary | ICD-10-CM | POA: Diagnosis not present

## 2022-07-26 DIAGNOSIS — K581 Irritable bowel syndrome with constipation: Secondary | ICD-10-CM | POA: Diagnosis not present

## 2022-07-26 NOTE — Progress Notes (Signed)
Chief Complaint: FU  Referring Provider:  Sandford Craze, NP      ASSESSMENT AND PLAN;   #1. IBS-C. Neg colon with Bx 2017. No benefit with fiber, miralax.  She got significant relief from magnesium citrate.  #2. abdo pain- related to IBS. Neg CT AP 09/2020  #3. GERD with small HH  #4. Fatty liver with Nl LFTs 06/2021   Plan: -Omeprazole 40mg  po QD (1/2 hrs before supper) -Hold off on colon as she is not having any red flag symptoms. -Extensive notes, labs, recent hospitalization, MR abdomen was reviewed. -FU in 6 months    HPI:    [name] is a 72 y.o. female  With extensive complex GI history, has been to multiple gastroenterologists. Recent OSA on sleep study (awaiting BiPAP)  With history of anxiety/depression, GERD, HTN, migraine has been seen at [facility] for chronic nausea/vomiting/diarrhea several years ago by Dr. Alycia Rossetti and diagnosed as having IBS.  She had neg extensive GI eval as below.  C/O lower abdo pain with bloating with constipation.  She started taking magnesium citrate with resultant improvement in symptoms.  She is willing to try magnesium supplements.  She has tried fiber supplements and MiraLAX in the past without any definite benefit.  Would really have diarrhea now.  Neg extensive GI evaluation as detailed below.  Normal CBC, CMP  Has had tried gluten-free diet without any benefit  2 cups coffee- keeps her regular. Stopped docusate, miralax. On Mg supplents    From previous notes: -Had gastroenteritis while visiting her brother (who was Dx with head and neck CA) in [place].  She started having profuse diarrhea after eating cheese quesadilla followed by nausea/vomiting.  She left [place] early and had to "stop at every stop" with diarrhea, spillage and incontinence.  She had to change her clothes twice.  Admitted 01/08/2020-01/10/2020 with dehydration, AKI treated with IV fluids.  Her stool studies  including stool for C. difficile was negative.  Also was found to have UTI and treated with Cipro.  From GI standpoint, she was managed conservatively.  Wt Readings from Last 3 Encounters:  07/26/22 171 lb (77.6 kg)  07/20/22 170 lb 9.6 oz (77.4 kg)  07/07/22 171 lb 9.6 oz (77.8 kg)   Previously had abnormal LFTs due to fatty liver.  Had EUS performed by Dr. Elnoria Howard as below which only showed fatty liver.  No choledocholithiasis.  Most recently LFTs have been normal.   Past GI procedures:  CT 09/2020 1. No acute findings in the abdomen or pelvis. 2. Moderate volume of formed stool throughout the descending and sigmoid colon, which may represent constipation. 3. Sigmoid colonic diverticulosis without findings of acute diverticulitis. 4. Hepatic steatosis. 5.  Aortic Atherosclerosis (ICD10-I70.0).  EGD 12/2019 - Small hiatal hernia. - Moderate gastritis. - Neg SB Bx for celiac, gastric Bx -negative for HP    Records from Care Everywhere:  H/O Nausea 2017  1. Nausea may be due to IBS-like syndrome. Bentyl 10 mg QID and increasing doses of Citracill 2. Stress related nausea may be a separate issue and she will see you regarding adjustment of appropriate drugs for this.  1. EGD 02/17/2011 (Dr Loreta Ave)- Small HH, normal --- bx showed no H pylori. Neg eso Bx for EoE 2. Colonoscopy 2017 at [facility] showed no colitis 3. Smartpill motility study--normal stomach, small bowel and colon transit times  MRI abdomen 01/06/2019 1. Mild motion degradation. 2. No dominant or suspicious renal lesion. No  correlate for the ultrasound abnormality. 3. Tiny bilateral renal lesions which are likely cysts. A 4 mm lower pole right renal angiomyolipoma is similar back to 11/19/2008. 4.  Tiny hiatal hernia. 5. Hepatic steatosis.  Past Medical History:  Diagnosis Date   AKI (acute kidney injury) (HCC) 01/09/2020   Allergy    seasonal   Anxiety    Arachnoiditis    Cataract    Depression     Elevated liver enzymes    Environmental allergies    GERD (gastroesophageal reflux disease)    H/O being hospitalized 03/2012   x1 week for nausea   History of chicken pox    Hyperlipidemia    Hypertension    Migraine with typical aura    resolved years ago   Nausea    chronic nausea    Neuromuscular disorder (HCC)    Pneumonia 03/02/2022    Past Surgical History:  Procedure Laterality Date   ABDOMINOPLASTY  07/11/09   EUS N/A 05/05/2012   Procedure: UPPER ENDOSCOPIC ULTRASOUND (EUS) LINEAR;  Surgeon: Theda Belfast, MD;  Location: [facility];  Service: Endoscopy;  Laterality: N/A;   LAPAROSCOPIC CHOLECYSTECTOMY  2010   NASAL SINUS SURGERY     "Has had 2-3 surgeries over the years"   UMBILICAL HERNIA REPAIR N/A 10/06/2020   Procedure: UMBILICAL HERNIA REPAIR;  Surgeon: Abigail Miyamoto, MD;  Location: [facility];  Service: General;  Laterality: N/A;  LMA ANESTHESIA    Family History  Problem Relation Age of Onset   Alzheimer's disease Mother    Hypertension Mother    Migraines Mother    Hypertension Father    Other Father        ?carotid artery aneurysm   Cancer Brother        mouth and throat   Colon cancer Neg Hx     Social History   Tobacco Use   Smoking status: Former    Packs/day: 0.25    Years: 8.00    Additional pack years: 0.00    Total pack years: 2.00    Types: Cigarettes    Quit date: 08/09/1986    Years since quitting: 35.9   Smokeless tobacco: Never  Vaping Use   Vaping Use: Never used  Substance Use Topics   Alcohol use: No   Drug use: No    Current Outpatient Medications  Medication Sig Dispense Refill   albuterol (VENTOLIN HFA) 108 (90 Base) MCG/ACT inhaler Inhale 1-2 puffs into the lungs every 6 (six) hours as needed. 8 g 2   amLODipine (NORVASC) 5 MG tablet TAKE 1 TABLET (5 MG TOTAL) BY MOUTH DAILY. 90 tablet 1   cetirizine (ZYRTEC) 10 MG tablet Take 10 mg by mouth daily.     clonazePAM (KLONOPIN) 1 MG tablet TAKE 1  TABLET BY MOUTH THREE TIMES A DAY AS NEEDED FOR ANXIETY 90 tablet 0   escitalopram (LEXAPRO) 10 MG tablet Take 1 tablet (10 mg total) by mouth daily.     fluticasone furoate-vilanterol (BREO ELLIPTA) 200-25 MCG/ACT AEPB Inhale 1 puff into the lungs daily. 1 each 5   gabapentin (NEURONTIN) 100 MG capsule Take 2 capsules (200 mg total) by mouth 3 (three) times daily. 270 capsule 0   mirtazapine (REMERON) 45 MG tablet Take 45 mg by mouth at bedtime.      Misc Natural Products (NEURIVA PO) Take by mouth.     montelukast (SINGULAIR) 10 MG tablet TAKE 1 TABLET BY MOUTH EVERYDAY AT BEDTIME 90 tablet 1  Multiple Vitamin (MULTIVITAMIN) capsule Take 1 capsule by mouth daily. Dynamic brain     omeprazole (PRILOSEC) 40 MG capsule TAKE 1 CAPSULE (40 MG TOTAL) BY MOUTH IN THE MORNING AND AT BEDTIME. 180 capsule 1   ondansetron (ZOFRAN-ODT) 4 MG disintegrating tablet TAKE 1 TABLET BY MOUTH EVERY 8 HOURS AS NEEDED FOR NAUSEA AND VOMITING 30 tablet 0   traZODone (DESYREL) 50 MG tablet Take 50-150 mg by mouth at bedtime as needed for sleep.      No current facility-administered medications for this visit.    Allergies  Allergen Reactions   Compazine [Prochlorperazine]     hyperactivity   Morphine And Related Hives and Itching   Trintellix [Vortioxetine] Nausea And Vomiting    Review of Systems:  Psychiatric/Behavioral: Has anxiety or depression     Physical Exam:    BP 122/72   Pulse 68   Ht 5\' 2"  (1.575 m)   Wt 171 lb (77.6 kg)   LMP 03/29/1993 Comment: 12/25/13 Pt states last cycle has been about 20 yrs ago.  BMI 31.28 kg/m  Wt Readings from Last 3 Encounters:  07/26/22 171 lb (77.6 kg)  07/20/22 170 lb 9.6 oz (77.4 kg)  07/07/22 171 lb 9.6 oz (77.8 kg)   Constitutional:  Well-developed, in no acute distress. Psychiatric: Normal mood and affect. Behavior is normal. HEENT: Pupils normal.  Conjunctivae are normal. No scleral icterus. Cardiovascular: Normal rate, regular rhythm. No  edema Pulmonary/chest: Effort normal and breath sounds normal. No wheezing, rales or rhonchi. Abdominal: Soft, nondistended.  Has epigastric tenderness.  Bowel sounds active throughout. There are no masses palpable. No hepatomegaly. Rectal:  defered Neurological: Alert and oriented to person place and time. Skin: Skin is warm and dry. No rashes noted.  Data Reviewed: I have personally reviewed following labs and imaging studies  CBC:    Latest Ref Rng & Units 03/11/2022   11:24 AM 07/07/2021   11:33 AM 12/30/2020    8:44 AM  CBC  WBC 4.0 - 10.5 K/uL 8.7  8.5  10.0   Hemoglobin 12.0 - 15.0 g/dL 16.1  09.6  04.5   Hematocrit 36.0 - 46.0 % 40.0  40.8  43.7   Platelets 150.0 - 400.0 K/uL 155.0  117.0  194.0     CMP:    Latest Ref Rng & Units 01/06/2022   11:10 AM 07/07/2021   11:33 AM 12/30/2020    8:44 AM  CMP  Glucose 70 - 99 mg/dL 86  98  85   BUN 6 - 23 mg/dL 19  19  20    Creatinine 0.40 - 1.20 mg/dL 4.09  8.11  9.14   Sodium 135 - 145 mEq/L 139  138  141   Potassium 3.5 - 5.1 mEq/L 4.0  4.1  4.0   Chloride 96 - 112 mEq/L 106  102  103   CO2 19 - 32 mEq/L 25  30  30    Calcium 8.4 - 10.5 mg/dL 9.1  9.2  78.2   Total Protein 6.0 - 8.3 g/dL  6.6  6.8   Total Bilirubin 0.2 - 1.2 mg/dL  0.5  0.3   Alkaline Phos 39 - 117 U/L  79  107   AST 0 - 37 U/L  17  15   ALT 0 - 35 U/L  13  14         Edman Circle, MD 07/26/2022, 11:09 AM  Cc: Sandford Craze, NP

## 2022-07-26 NOTE — Patient Instructions (Signed)
_______________________________________________________  If your blood pressure at your visit was 140/90 or greater, please contact your primary care physician to follow up on this.  _______________________________________________________  If you are age 72 or older, your body mass index should be between 23-30. Your Body mass index is 31.28 kg/m. If this is out of the aforementioned range listed, please consider follow up with your Primary Care Provider.  If you are age 41 or younger, your body mass index should be between 19-25. Your Body mass index is 31.28 kg/m. If this is out of the aformentioned range listed, please consider follow up with your Primary Care Provider.   ________________________________________________________  The [facility] providers would like to encourage you to use [facility] to communicate with providers for non-urgent requests or questions.  Due to long hold times on the telephone, sending your provider a message by [facility] may be a faster and more efficient way to get a response.  Please allow 48 business hours for a response.  Please remember that this is for non-urgent requests.  _______________________________________________________  Continue omeprazole   Please follow up in 6 months. Give [place] a call at [phone] to schedule an appointment.  Thank you,  Dr. Lynann Bologna

## 2022-07-27 ENCOUNTER — Telehealth: Payer: Self-pay | Admitting: Adult Health

## 2022-07-27 NOTE — Telephone Encounter (Signed)
PT calling for 2 reasons.  She has not heard abut the Bipap that was ordered for her. (I confirmed it was sent in that day.)  She was told a referral would be sent to Dr. Gretchen Portela and she has not heard from them.     Pls call PT to advise @ [phone]

## 2022-07-28 DIAGNOSIS — G4733 Obstructive sleep apnea (adult) (pediatric): Secondary | ICD-10-CM | POA: Diagnosis not present

## 2022-08-19 DIAGNOSIS — G4733 Obstructive sleep apnea (adult) (pediatric): Secondary | ICD-10-CM | POA: Diagnosis not present

## 2022-08-19 NOTE — Telephone Encounter (Signed)
84 weeks old no attempt to contact. Please contact PT. TY

## 2022-08-20 NOTE — Telephone Encounter (Signed)
Order was placed 4/23 and confirmation was received by Christoper Allegra 4/24.  Called [employer] and spoke with Wylene Men about the message and she said pt has been set up on the bipap machine. Pt was set up 5/1 and had a mask refit 5/17. Nothing further needed.

## 2022-08-25 ENCOUNTER — Telehealth: Payer: Self-pay | Admitting: Family

## 2022-08-25 NOTE — Telephone Encounter (Signed)
I would recommend that she try nasal saline rinses, claritin and flonase for nasal congestion.  For  If symptoms fail to improve, please schedule visit. She can take mucinex dm for cough/chest congestion and tylenol as needed for pain.

## 2022-08-25 NOTE — Telephone Encounter (Signed)
Pt called to advise that she is about to go out of town for her daughter's graduation this weekend and she believes that she has a sinus infection. Pt was told a few times that she will need an appt to be prescribed something but pt said she would just like to know what she could take over the counter for cough/sore throat/chest tightness so that she can feel better. Please call to advise

## 2022-08-26 NOTE — Telephone Encounter (Signed)
Patient notified or provider comments and recommendations. She will try some of this otc medications and call for vv if no improvement. She will be out of town but still in [place], she will call for virtual visit if needed

## 2022-08-28 DIAGNOSIS — G4733 Obstructive sleep apnea (adult) (pediatric): Secondary | ICD-10-CM | POA: Diagnosis not present

## 2022-08-30 ENCOUNTER — Encounter: Payer: Self-pay | Admitting: Pulmonary Disease

## 2022-08-30 NOTE — Telephone Encounter (Signed)
That is fine. Please reschedule follow up in 3-4 months. Thanks

## 2022-08-30 NOTE — Telephone Encounter (Signed)
Dr. Isaiah Serge,   Please advise on pt email-  I received your message for a follow up appointment.  I have not been able to use my Bi-Pap machine.  I purchased a chin strap because I was breathing through my mouth.   I also have been dealing with my husband's AFIB.  It's been very stressful. I am going to attempt using my machine starting tonight.  Would you like to push the appt for followup out a little? That way it gives her time to try CPAP?

## 2022-09-07 ENCOUNTER — Other Ambulatory Visit: Payer: Self-pay | Admitting: Family

## 2022-09-13 DIAGNOSIS — Z9889 Other specified postprocedural states: Secondary | ICD-10-CM | POA: Diagnosis not present

## 2022-09-13 DIAGNOSIS — J3489 Other specified disorders of nose and nasal sinuses: Secondary | ICD-10-CM | POA: Diagnosis not present

## 2022-09-13 DIAGNOSIS — J309 Allergic rhinitis, unspecified: Secondary | ICD-10-CM | POA: Diagnosis not present

## 2022-09-27 DIAGNOSIS — G4733 Obstructive sleep apnea (adult) (pediatric): Secondary | ICD-10-CM | POA: Diagnosis not present

## 2022-10-13 ENCOUNTER — Other Ambulatory Visit: Payer: Self-pay | Admitting: Family

## 2022-10-13 DIAGNOSIS — J309 Allergic rhinitis, unspecified: Secondary | ICD-10-CM

## 2022-10-25 ENCOUNTER — Other Ambulatory Visit: Payer: Medicare Other

## 2022-10-28 DIAGNOSIS — G4733 Obstructive sleep apnea (adult) (pediatric): Secondary | ICD-10-CM | POA: Diagnosis not present

## 2022-11-06 ENCOUNTER — Other Ambulatory Visit: Payer: Self-pay | Admitting: Family

## 2022-11-08 ENCOUNTER — Other Ambulatory Visit: Payer: Medicare Other

## 2022-11-08 ENCOUNTER — Other Ambulatory Visit: Payer: Self-pay

## 2022-11-08 ENCOUNTER — Telehealth: Payer: Self-pay | Admitting: Family

## 2022-11-08 ENCOUNTER — Other Ambulatory Visit: Payer: Self-pay | Admitting: Family

## 2022-11-08 MED ORDER — GABAPENTIN 100 MG PO CAPS: 200.0000 mg | ORAL_CAPSULE | Freq: Three times a day (TID) | ORAL | 0 refills | Status: DC

## 2022-11-08 NOTE — Telephone Encounter (Signed)
Patient needs a refill on gabapentin. She only has enough for today. Refill request was [facility]. Please send refill to [facility]

## 2022-11-08 NOTE — Telephone Encounter (Signed)
Rx sent 

## 2022-11-26 DIAGNOSIS — G4733 Obstructive sleep apnea (adult) (pediatric): Secondary | ICD-10-CM | POA: Diagnosis not present

## 2022-12-08 DIAGNOSIS — L820 Inflamed seborrheic keratosis: Secondary | ICD-10-CM | POA: Diagnosis not present

## 2022-12-08 DIAGNOSIS — D485 Neoplasm of uncertain behavior of skin: Secondary | ICD-10-CM | POA: Diagnosis not present

## 2023-01-04 ENCOUNTER — Ambulatory Visit (INDEPENDENT_AMBULATORY_CARE_PROVIDER_SITE_OTHER): Payer: Medicare Other | Admitting: Family

## 2023-01-04 VITALS — BP 132/59 | HR 68 | Temp 98.2°F | Resp 16 | Ht 63.0 in | Wt 166.2 lb

## 2023-01-04 DIAGNOSIS — R918 Other nonspecific abnormal finding of lung field: Secondary | ICD-10-CM | POA: Diagnosis not present

## 2023-01-04 DIAGNOSIS — G4733 Obstructive sleep apnea (adult) (pediatric): Secondary | ICD-10-CM

## 2023-01-04 DIAGNOSIS — F419 Anxiety disorder, unspecified: Secondary | ICD-10-CM

## 2023-01-04 DIAGNOSIS — R110 Nausea: Secondary | ICD-10-CM

## 2023-01-04 DIAGNOSIS — F32A Depression, unspecified: Secondary | ICD-10-CM

## 2023-01-04 DIAGNOSIS — I10 Essential (primary) hypertension: Secondary | ICD-10-CM

## 2023-01-04 DIAGNOSIS — K219 Gastro-esophageal reflux disease without esophagitis: Secondary | ICD-10-CM | POA: Diagnosis not present

## 2023-01-04 DIAGNOSIS — E781 Pure hyperglyceridemia: Secondary | ICD-10-CM

## 2023-01-04 DIAGNOSIS — G039 Meningitis, unspecified: Secondary | ICD-10-CM | POA: Diagnosis not present

## 2023-01-04 DIAGNOSIS — J309 Allergic rhinitis, unspecified: Secondary | ICD-10-CM | POA: Diagnosis not present

## 2023-01-04 MED ORDER — GABAPENTIN 300 MG PO CAPS
300.0000 mg | ORAL_CAPSULE | Freq: Three times a day (TID) | ORAL | 1 refills | Status: DC
Start: 2023-01-04 — End: 2023-05-03

## 2023-01-04 NOTE — Assessment & Plan Note (Signed)
She is still working on adjusting her mask/tubing for her BIPAP. This is being managed by pulmonology.

## 2023-01-04 NOTE — Assessment & Plan Note (Signed)
Anxiety is increased- has upcoming appointment with psychiatry.

## 2023-01-04 NOTE — Assessment & Plan Note (Signed)
Tried zofran without significant improvement in her symptoms.

## 2023-01-04 NOTE — Assessment & Plan Note (Signed)
At goal on amlodipine, continue same.

## 2023-01-04 NOTE — Assessment & Plan Note (Signed)
Lab Results  Component Value Date   CHOL 214 (H) 01/06/2022   HDL 53.90 01/06/2022   LDLCALC 128 (H) 01/06/2022   LDLDIRECT 110.0 12/30/2020   TRIG 159.0 (H) 01/06/2022   CHOLHDL 4 01/06/2022

## 2023-01-04 NOTE — Assessment & Plan Note (Addendum)
Update CT scan.

## 2023-01-04 NOTE — Assessment & Plan Note (Signed)
Uncontrolled. Will increase gabapentin from 200mg  tid to 300mg  tid.

## 2023-01-04 NOTE — Patient Instructions (Signed)
VISIT SUMMARY:  During your visit, we discussed your ongoing issues with arachnoiditis, gastroesophageal reflux disease (GERD), chronic nausea, asthma/allergic rhinitis, and a new skin lesion. We also discussed your general health maintenance and follow-up plans.  YOUR PLAN:  -ARACHNOIDITIS: Arachnoiditis is inflammation of one of the membranes that surround and protect the nerves of the spinal cord. Your pain has worsened and spread to your shoulder blades and hips, and you're experiencing a cold burning sensation down your legs. To better manage your pain, we're increasing your Gabapentin dosage to 300mg  three times a day.  -GASTROESOPHAGEAL REFLUX DISEASE (GERD): GERD is a chronic condition where stomach acid frequently flows back into the tube connecting your mouth and stomach (esophagus). Despite taking Omeprazole, you're still experiencing a burning sensation in your chest. We'll continue with the Omeprazole treatment.  -CHRONIC NAUSEA: You've been experiencing ongoing nausea that's not well controlled. We're considering reinitiating Ondansetron if your current supply is still in date, or we'll refill your prescription if it's expired.  -ASTHMA/ALLERGIC RHINITIS: You've reported congestion, particularly at night. You're currently on Montelukast and Zyrtec, and we'll continue with these medications.  -SKIN LESIONS: You've recently had moles removed by a dermatologist and have a new pimple-like lesion on your face. We'll monitor this new lesion and you should contact the office if it doesn't resolve.  -GENERAL HEALTH MAINTENANCE: We'll update your lab work and continue with Amlodipine for blood pressure control. Your immunizations are up to date. We're also ordering a chest CT to follow up on a lung nodule.  INSTRUCTIONS:  Please follow the new treatment plan and continue taking your current medications. Monitor your new skin lesion and contact the office if it doesn't resolve. We'll follow  up in 6 months unless changes occur. If you have any questions or concerns, don't hesitate to contact the office.

## 2023-01-04 NOTE — Assessment & Plan Note (Signed)
Uncontrolled. Reinforced gerd diet.

## 2023-01-04 NOTE — Assessment & Plan Note (Signed)
Stable control on singulair and zyrtec. Continue same.

## 2023-01-04 NOTE — Progress Notes (Signed)
[id number]  Subjective:     Patient ID: [name], female    DOB: 05-Jul-1950, 72 y.o.   MRN: [id number]  Chief Complaint  Patient presents with   Follow-up    6 month    HPI  Discussed the use of AI scribe software for clinical note transcription with the patient, who gave verbal consent to proceed.  History of Present Illness   The patient, with a history of arachnoiditis, presents with worsening back pain that has now moved up to the shoulder blades. The pain is associated with a cold burning sensation down the legs. The patient reports that these symptoms have significantly affected her mobility and ability to perform daily activities such as grocery shopping. The patient is currently on gabapentin for pain management. She does not want to pursue any invasive measures.  The patient also reports significant stress due to her husband's recent health issues, including atrial fibrillation and cardiac ablation. This has added to the patient's overall stress and anxiety levels.  In addition to the back pain, the patient also reports chronic nausea and acid reflux. The patient is currently on omeprazole for the acid reflux, but reports that it only works intermittently. The patient also reports sinus congestion and is currently on montelukast and Zyrtec for this.       Health Maintenance Due  Topic Date Due   DTaP/Tdap/Td (2 - Tdap) 06/07/2018    Past Medical History:  Diagnosis Date   AKI (acute kidney injury) (HCC) 01/09/2020   Allergy    seasonal   Anxiety    Arachnoiditis    Cataract    Depression    Elevated liver enzymes    Environmental allergies    GERD (gastroesophageal reflux disease)    H/O being hospitalized 03/2012   x1 week for nausea   History of chicken pox    Hyperlipidemia    Hypertension    Migraine with typical aura    resolved years ago   Nausea    chronic nausea    Neuromuscular disorder (HCC)    Pneumonia 03/02/2022    Past Surgical History:   Procedure Laterality Date   ABDOMINOPLASTY  07/11/09   EUS N/A 05/05/2012   Procedure: UPPER ENDOSCOPIC ULTRASOUND (EUS) LINEAR;  Surgeon: Theda Belfast, MD;  Location: [facility];  Service: Endoscopy;  Laterality: N/A;   LAPAROSCOPIC CHOLECYSTECTOMY  2010   NASAL SINUS SURGERY     "Has had 2-3 surgeries over the years"   UMBILICAL HERNIA REPAIR N/A 10/06/2020   Procedure: UMBILICAL HERNIA REPAIR;  Surgeon: Abigail Miyamoto, MD;  Location: [facility];  Service: General;  Laterality: N/A;  LMA ANESTHESIA    Family History  Problem Relation Age of Onset   Alzheimer's disease Mother    Hypertension Mother    Migraines Mother    Hypertension Father    Other Father        ?carotid artery aneurysm   Cancer Brother        mouth and throat   Colon cancer Neg Hx     Social History   Socioeconomic History   Marital status: Married    Spouse name: Not on file   Number of children: Not on file   Years of education: Not on file   Highest education level: 12th grade  Occupational History   Not on file  Tobacco Use   Smoking status: Former    Current packs/day: 0.00    Average packs/day: 0.3 packs/day for 8.0  years (2.0 ttl pk-yrs)    Types: Cigarettes    Start date: 08/09/1978    Quit date: 08/09/1986    Years since quitting: 36.4   Smokeless tobacco: Never  Vaping Use   Vaping status: Never Used  Substance and Sexual Activity   Alcohol use: No   Drug use: No   Sexual activity: Not Currently    Partners: Male    Birth control/protection: Other-see comments, Post-menopausal    Comment: husband had vasectomy.   Other Topics Concern   Not on file  Social History Narrative   2 step daughters- 16 and 54   She worked as an Environmental health practitioner.   Enjoys puzzles, counted cross stitch.   Completed 1 year of college   4 cats   Social Determinants of Health   Financial Resource Strain: Low Risk  (07/02/2022)   Overall Financial Resource Strain (CARDIA)     Difficulty of Paying Living Expenses: Not hard at all  Food Insecurity: No Food Insecurity (07/02/2022)   Hunger Vital Sign    Worried About Running Out of Food in the Last Year: Never true    Ran Out of Food in the Last Year: Never true  Transportation Needs: No Transportation Needs (07/02/2022)   PRAPARE - Administrator, Civil Service (Medical): No    Lack of Transportation (Non-Medical): No  Physical Activity: Inactive (07/02/2022)   Exercise Vital Sign    Days of Exercise per Week: 0 days    Minutes of Exercise per Session: 0 min  Stress: No Stress Concern Present (07/02/2022)   [name] of Occupational Health - Occupational Stress Questionnaire    Feeling of Stress : Only a little  Social Connections: Moderately Isolated (07/02/2022)   Social Connection and Isolation Panel [NHANES]    Frequency of Communication with Friends and Family: More than three times a week    Frequency of Social Gatherings with Friends and Family: Patient declined    Attends Religious Services: Never    Database administrator or Organizations: No    Attends Banker Meetings: Never    Marital Status: Married  Catering manager Violence: Not At Risk (04/23/2022)   Humiliation, Afraid, Rape, and Kick questionnaire    Fear of Current or Ex-Partner: No    Emotionally Abused: No    Physically Abused: No    Sexually Abused: No    Outpatient Medications Prior to Visit  Medication Sig Dispense Refill   amLODipine (NORVASC) 5 MG tablet TAKE 1 TABLET (5 MG TOTAL) BY MOUTH DAILY. 90 tablet 1   cetirizine (ZYRTEC) 10 MG tablet Take 10 mg by mouth daily.     clonazePAM (KLONOPIN) 1 MG tablet TAKE 1 TABLET BY MOUTH THREE TIMES A DAY AS NEEDED FOR ANXIETY 90 tablet 0   escitalopram (LEXAPRO) 10 MG tablet Take 1 tablet (10 mg total) by mouth daily.     mirtazapine (REMERON) 45 MG tablet Take 45 mg by mouth at bedtime.      Misc Natural Products (NEURIVA PO) Take by mouth.     montelukast  (SINGULAIR) 10 MG tablet TAKE 1 TABLET BY MOUTH EVERYDAY AT BEDTIME 90 tablet 1   Multiple Vitamin (MULTIVITAMIN) capsule Take 1 capsule by mouth daily. Dynamic brain     omeprazole (PRILOSEC) 40 MG capsule TAKE 1 CAPSULE (40 MG TOTAL) BY MOUTH IN THE MORNING AND AT BEDTIME. 180 capsule 1   ondansetron (ZOFRAN-ODT) 4 MG disintegrating tablet TAKE 1 TABLET BY MOUTH EVERY  8 HOURS AS NEEDED FOR NAUSEA AND VOMITING 30 tablet 0   traZODone (DESYREL) 50 MG tablet Take 50-150 mg by mouth at bedtime as needed for sleep.      gabapentin (NEURONTIN) 100 MG capsule Take 2 capsules (200 mg total) by mouth 3 (three) times daily. 270 capsule 0   albuterol (VENTOLIN HFA) 108 (90 Base) MCG/ACT inhaler Inhale 1-2 puffs into the lungs every 6 (six) hours as needed. 8 g 2   fluticasone furoate-vilanterol (BREO ELLIPTA) 200-25 MCG/ACT AEPB Inhale 1 puff into the lungs daily. 1 each 5   No facility-administered medications prior to visit.    Allergies  Allergen Reactions   Compazine [Prochlorperazine]     hyperactivity   Morphine And Codeine Hives and Itching   Trintellix [Vortioxetine] Nausea And Vomiting    ROS    See HPI Objective:    Physical Exam Constitutional:      General: She is not in acute distress.    Appearance: Normal appearance. She is well-developed.  HENT:     Head: Normocephalic and atraumatic.     Right Ear: External ear normal.     Left Ear: External ear normal.  Eyes:     General: No scleral icterus. Neck:     Thyroid: No thyromegaly.  Cardiovascular:     Rate and Rhythm: Normal rate and regular rhythm.     Heart sounds: Normal heart sounds. No murmur heard. Pulmonary:     Effort: Pulmonary effort is normal. No respiratory distress.     Breath sounds: Normal breath sounds. No wheezing.  Musculoskeletal:        General: No swelling.     Cervical back: Neck supple.  Skin:    General: Skin is warm and dry.  Neurological:     Mental Status: She is alert and oriented to  person, place, and time.  Psychiatric:        Mood and Affect: Mood normal.        Behavior: Behavior normal.        Thought Content: Thought content normal.        Judgment: Judgment normal.      BP (!) 132/59 (BP Location: Right Arm, Patient Position: Sitting, Cuff Size: Normal)   Pulse 68   Temp 98.2 F (36.8 C) (Oral)   Resp 16   Ht 5\' 3"  (1.6 m)   Wt 166 lb 3.2 oz (75.4 kg)   LMP 03/29/1993 Comment: 12/25/13 Pt states last cycle has been about 20 yrs ago.  SpO2 94%   BMI 29.44 kg/m  Wt Readings from Last 3 Encounters:  01/04/23 166 lb 3.2 oz (75.4 kg)  07/26/22 171 lb (77.6 kg)  07/20/22 170 lb 9.6 oz (77.4 kg)       Assessment & Plan:   Problem List Items Addressed This Visit       Unprioritized   OSA (obstructive sleep apnea)    She is still working on adjusting her mask/tubing for her BIPAP. This is being managed by pulmonology.       Lung nodules    Update CT scan.      Relevant Orders   CT Chest Wo Contrast   Hypertriglyceridemia    Lab Results  Component Value Date   CHOL 214 (H) 01/06/2022   HDL 53.90 01/06/2022   LDLCALC 128 (H) 01/06/2022   LDLDIRECT 110.0 12/30/2020   TRIG 159.0 (H) 01/06/2022   CHOLHDL 4 01/06/2022         Relevant  Orders   Lipid panel   Comp Met (CMET)   Hypertension    At goal on amlodipine, continue same.       GERD (gastroesophageal reflux disease)    Uncontrolled. Reinforced gerd diet.        Chronic nausea    Tried zofran without significant improvement in her symptoms.      Arachnoiditis - Primary    Uncontrolled. Will increase gabapentin from 200mg  tid to 300mg  tid.      Relevant Medications   gabapentin (NEURONTIN) 300 MG capsule   Anxiety and depression    Anxiety is increased- has upcoming appointment with psychiatry.      Allergic rhinitis    Stable control on singulair and zyrtec. Continue same.       I have discontinued Emma-Louise Khalsa's fluticasone furoate-vilanterol, albuterol, and  gabapentin. I am also having her start on gabapentin. Additionally, I am having her maintain her clonazePAM, traZODone, mirtazapine, multivitamin, escitalopram, ondansetron, omeprazole, amLODipine, Misc Natural Products (NEURIVA PO), cetirizine, and montelukast.  Meds ordered this encounter  Medications   gabapentin (NEURONTIN) 300 MG capsule    Sig: Take 1 capsule (300 mg total) by mouth 3 (three) times daily.    Dispense:  270 capsule    Refill:  1    Order Specific Question:   Supervising Provider    Answer:   Danise Edge A [[id number]]

## 2023-01-05 LAB — LIPID PANEL
Cholesterol: 199 mg/dL (ref 0–200)
HDL: 50.2 mg/dL (ref >39.00)
LDL Cholesterol: 112 mg/dL — ABNORMAL HIGH (ref 0–99)
NonHDL: 148.31
Total CHOL/HDL Ratio: 4
Triglycerides: 184 mg/dL — ABNORMAL HIGH (ref 0.0–149.0)
VLDL: 36.8 mg/dL (ref 0.0–40.0)

## 2023-01-05 LAB — COMPREHENSIVE METABOLIC PANEL
ALT: 18 U/L (ref 0–35)
AST: 19 U/L (ref 0–37)
Albumin: 4.2 g/dL (ref 3.5–5.2)
Alkaline Phosphatase: 89 U/L (ref 39–117)
BUN: 15 mg/dL (ref 6–23)
CO2: 28 meq/L (ref 19–32)
Calcium: 9.4 mg/dL (ref 8.4–10.5)
Chloride: 104 meq/L (ref 96–112)
Creatinine, Ser: 0.94 mg/dL (ref 0.40–1.20)
GFR: 60.83 mL/min (ref >60.00)
Glucose, Bld: 101 mg/dL — ABNORMAL HIGH (ref 70–99)
Potassium: 4.1 meq/L (ref 3.5–5.1)
Sodium: 141 meq/L (ref 135–145)
Total Bilirubin: 0.4 mg/dL (ref 0.2–1.2)
Total Protein: 6.3 g/dL (ref 6.0–8.3)

## 2023-01-28 ENCOUNTER — Telehealth (HOSPITAL_BASED_OUTPATIENT_CLINIC_OR_DEPARTMENT_OTHER): Payer: Self-pay

## 2023-02-04 ENCOUNTER — Other Ambulatory Visit: Payer: Self-pay | Admitting: Family

## 2023-02-07 ENCOUNTER — Encounter: Payer: Self-pay | Admitting: Family

## 2023-02-14 ENCOUNTER — Telehealth (INDEPENDENT_AMBULATORY_CARE_PROVIDER_SITE_OTHER): Payer: Medicare Other | Admitting: Family

## 2023-02-14 DIAGNOSIS — J329 Chronic sinusitis, unspecified: Secondary | ICD-10-CM

## 2023-02-14 MED ORDER — AMOXICILLIN-POT CLAVULANATE 875-125 MG PO TABS: 1.0000 | ORAL_TABLET | Freq: Two times a day (BID) | ORAL | 0 refills | Status: DC

## 2023-02-14 NOTE — Progress Notes (Signed)
MyChart Video Visit    Virtual Visit via Video Note    Patient location: Home. Patient and provider in visit Provider location: Office  I discussed the limitations of evaluation and management by telemedicine and the availability of in person appointments. The patient expressed understanding and agreed to proceed.  Visit Date: 02/14/2023  Today's healthcare provider: Lemont Fillers, NP     Subjective:    Patient ID: [name], female    DOB: 1951/01/03, 72 y.o.   MRN: [id number]  Chief Complaint  Patient presents with   Cough    Still having persistent cough   Sinus Problem    Sinus pain and pressure     Cough  Sinus Problem Associated symptoms include coughing.    Patient is a 72 yr old female who presents today to discuss multiple symptoms. She reports that she developed sinus congestion, cough, fatigue about 2 weeks ago.  Has been using zyrtec, ibuprofen, delsym prn with minimal improvement in symptoms.    She has not tested for covid and she has not seen any other provides during this visit.   Past Medical History:  Diagnosis Date   AKI (acute kidney injury) (HCC) 01/09/2020   Allergy    seasonal   Anxiety    Arachnoiditis    Cataract    Depression    Elevated liver enzymes    Environmental allergies    GERD (gastroesophageal reflux disease)    H/O being hospitalized 03/2012   x1 week for nausea   History of chicken pox    Hyperlipidemia    Hypertension    Migraine with typical aura    resolved years ago   Nausea    chronic nausea    Neuromuscular disorder (HCC)    Pneumonia 03/02/2022    Past Surgical History:  Procedure Laterality Date   ABDOMINOPLASTY  07/11/09   EUS N/A 05/05/2012   Procedure: UPPER ENDOSCOPIC ULTRASOUND (EUS) LINEAR;  Surgeon: Theda Belfast, MD;  Location: [facility];  Service: Endoscopy;  Laterality: N/A;   LAPAROSCOPIC CHOLECYSTECTOMY  2010   NASAL SINUS SURGERY     "Has had 2-3 surgeries over the years"    UMBILICAL HERNIA REPAIR N/A 10/06/2020   Procedure: UMBILICAL HERNIA REPAIR;  Surgeon: Abigail Miyamoto, MD;  Location: [facility];  Service: General;  Laterality: N/A;  LMA ANESTHESIA    Family History  Problem Relation Age of Onset   Alzheimer's disease Mother    Hypertension Mother    Migraines Mother    Hypertension Father    Other Father        ?carotid artery aneurysm   Cancer Brother        mouth and throat   Colon cancer Neg Hx     Social History   Socioeconomic History   Marital status: Married    Spouse name: Not on file   Number of children: Not on file   Years of education: Not on file   Highest education level: Some college, no degree  Occupational History   Not on file  Tobacco Use   Smoking status: Former    Current packs/day: 0.00    Average packs/day: 0.3 packs/day for 8.0 years (2.0 ttl pk-yrs)    Types: Cigarettes    Start date: 08/09/1978    Quit date: 08/09/1986    Years since quitting: 36.5   Smokeless tobacco: Never  Vaping Use   Vaping status: Never Used  Substance and Sexual Activity  Alcohol use: No   Drug use: No   Sexual activity: Not Currently    Partners: Male    Birth control/protection: Other-see comments, Post-menopausal    Comment: husband had vasectomy.   Other Topics Concern   Not on file  Social History Narrative   2 step daughters- 93 and 70   She worked as an Environmental health practitioner.   Enjoys puzzles, counted cross stitch.   Completed 1 year of college   4 cats   Social Determinants of Health   Financial Resource Strain: Low Risk  (02/12/2023)   Overall Financial Resource Strain (CARDIA)    Difficulty of Paying Living Expenses: Not hard at all  Food Insecurity: No Food Insecurity (02/12/2023)   Hunger Vital Sign    Worried About Running Out of Food in the Last Year: Never true    Ran Out of Food in the Last Year: Never true  Transportation Needs: No Transportation Needs (02/12/2023)   PRAPARE -  Administrator, Civil Service (Medical): No    Lack of Transportation (Non-Medical): No  Physical Activity: Inactive (02/12/2023)   Exercise Vital Sign    Days of Exercise per Week: 0 days    Minutes of Exercise per Session: 0 min  Stress: No Stress Concern Present (02/12/2023)   [name] of Occupational Health - Occupational Stress Questionnaire    Feeling of Stress : Only a little  Social Connections: Moderately Isolated (02/12/2023)   Social Connection and Isolation Panel [NHANES]    Frequency of Communication with Friends and Family: Three times a week    Frequency of Social Gatherings with Friends and Family: Once a week    Attends Religious Services: Never    Database administrator or Organizations: No    Attends Banker Meetings: Never    Marital Status: Married  Catering manager Violence: Not At Risk (04/23/2022)   Humiliation, Afraid, Rape, and Kick questionnaire    Fear of Current or Ex-Partner: No    Emotionally Abused: No    Physically Abused: No    Sexually Abused: No    Outpatient Medications Prior to Visit  Medication Sig Dispense Refill   amLODipine (NORVASC) 5 MG tablet TAKE 1 TABLET (5 MG TOTAL) BY MOUTH DAILY. 90 tablet 1   cetirizine (ZYRTEC) 10 MG tablet Take 10 mg by mouth daily.     clonazePAM (KLONOPIN) 1 MG tablet TAKE 1 TABLET BY MOUTH THREE TIMES A DAY AS NEEDED FOR ANXIETY 90 tablet 0   escitalopram (LEXAPRO) 10 MG tablet Take 1 tablet (10 mg total) by mouth daily.     gabapentin (NEURONTIN) 300 MG capsule Take 1 capsule (300 mg total) by mouth 3 (three) times daily. 270 capsule 1   mirtazapine (REMERON) 45 MG tablet Take 45 mg by mouth at bedtime.      Misc Natural Products (NEURIVA PO) Take by mouth.     montelukast (SINGULAIR) 10 MG tablet TAKE 1 TABLET BY MOUTH EVERYDAY AT BEDTIME 90 tablet 1   Multiple Vitamin (MULTIVITAMIN) capsule Take 1 capsule by mouth daily. Dynamic brain     omeprazole (PRILOSEC) 40 MG capsule  TAKE 1 CAPSULE (40 MG TOTAL) BY MOUTH IN THE MORNING AND AT BEDTIME. 180 capsule 1   ondansetron (ZOFRAN-ODT) 4 MG disintegrating tablet TAKE 1 TABLET BY MOUTH EVERY 8 HOURS AS NEEDED FOR NAUSEA AND VOMITING 30 tablet 0   traZODone (DESYREL) 50 MG tablet Take 50-150 mg by mouth at bedtime as needed for  sleep.      gabapentin (NEURONTIN) 100 MG capsule TAKE 2 CAPSULES BY MOUTH 3 TIMES DAILY. 270 capsule 0   No facility-administered medications prior to visit.    Allergies  Allergen Reactions   Compazine [Prochlorperazine]     hyperactivity   Morphine And Codeine Hives and Itching   Trintellix [Vortioxetine] Nausea And Vomiting    Review of Systems  Respiratory:  Positive for cough.        Objective:    Physical Exam Constitutional:      General: She is not in acute distress.    Appearance: Normal appearance. She is well-developed.  HENT:     Head: Normocephalic and atraumatic.  Eyes:     General: No scleral icterus. Cardiovascular:     Rate and Rhythm: Normal rate.     Heart sounds: No murmur heard. Pulmonary:     Effort: Pulmonary effort is normal.     Comments: Coarse cough noted Musculoskeletal:     Cervical back: Neck supple.  Neurological:     Mental Status: She is alert and oriented to person, place, and time.  Psychiatric:        Mood and Affect: Mood normal.        Behavior: Behavior normal.        Thought Content: Thought content normal.        Judgment: Judgment normal.     LMP 03/29/1993 Comment: 12/25/13 Pt states last cycle has been about 20 yrs ago. Wt Readings from Last 3 Encounters:  01/04/23 166 lb 3.2 oz (75.4 kg)  07/26/22 171 lb (77.6 kg)  07/20/22 170 lb 9.6 oz (77.4 kg)       Assessment & Plan:   Problem List Items Addressed This Visit       Unprioritized   Sinusitis - Primary    Symptoms seem most consistent with sinusitis at this point. Advised pt to begin augmentin bid. Continue delsym, ibuprofen prn.  Call if symptoms worsen or  if symptoms are not at improved in 4 days to schedule an in person visit.        Relevant Medications   amoxicillin-clavulanate (AUGMENTIN) 875-125 MG tablet    I am having [name] start on amoxicillin-clavulanate. I am also having her maintain her clonazePAM, traZODone, mirtazapine, multivitamin, escitalopram, ondansetron, omeprazole, amLODipine, Misc Natural Products (NEURIVA PO), cetirizine, montelukast, and gabapentin.  Meds ordered this encounter  Medications   amoxicillin-clavulanate (AUGMENTIN) 875-125 MG tablet    Sig: Take 1 tablet by mouth 2 (two) times daily.    Dispense:  20 tablet    Refill:  0    Order Specific Question:   Supervising Provider    Answer:   Danise Edge A [[id number]]    I discussed the assessment and treatment plan with the patient. The patient was provided an opportunity to ask questions and all were answered. The patient agreed with the plan and demonstrated an understanding of the instructions.   The patient was advised to call back or seek an in-person evaluation if the symptoms worsen or if the condition fails to improve as anticipated.    Lemont Fillers, NP [facility] [phone] (phone) [phone] (fax)  [facility]

## 2023-02-14 NOTE — Assessment & Plan Note (Signed)
Symptoms seem most consistent with sinusitis at this point. Advised pt to begin augmentin bid. Continue delsym, ibuprofen prn.  Call if symptoms worsen or if symptoms are not at improved in 4 days to schedule an in person visit.

## 2023-02-15 ENCOUNTER — Ambulatory Visit: Payer: Medicare Other | Admitting: Family

## 2023-02-22 ENCOUNTER — Telehealth: Payer: Self-pay | Admitting: General Practice

## 2023-02-22 ENCOUNTER — Ambulatory Visit: Payer: Medicare Other | Admitting: General Practice

## 2023-02-22 ENCOUNTER — Encounter: Payer: Self-pay | Admitting: General Practice

## 2023-02-22 ENCOUNTER — Ambulatory Visit (HOSPITAL_BASED_OUTPATIENT_CLINIC_OR_DEPARTMENT_OTHER)
Admission: RE | Admit: 2023-02-22 | Discharge: 2023-02-22 | Disposition: A | Discharge status: Home / Self Care | Payer: Medicare Other | Source: Ambulatory Visit | Attending: General Practice | Admitting: General Practice

## 2023-02-22 ENCOUNTER — Other Ambulatory Visit (HOSPITAL_BASED_OUTPATIENT_CLINIC_OR_DEPARTMENT_OTHER): Payer: Self-pay

## 2023-02-22 ENCOUNTER — Telehealth: Payer: Self-pay | Admitting: Family

## 2023-02-22 VITALS — BP 137/69 | HR 95 | Temp 99.5°F | Resp 16 | Ht 63.0 in | Wt 173.0 lb

## 2023-02-22 DIAGNOSIS — R053 Chronic cough: Secondary | ICD-10-CM | POA: Insufficient documentation

## 2023-02-22 DIAGNOSIS — R918 Other nonspecific abnormal finding of lung field: Secondary | ICD-10-CM | POA: Diagnosis not present

## 2023-02-22 MED ORDER — ALBUTEROL SULFATE HFA 108 (90 BASE) MCG/ACT IN AERS
2.0000 | INHALATION_SPRAY | RESPIRATORY_TRACT | 0 refills | Status: DC | PRN
Start: 2023-02-22 — End: 2023-05-03
  Filled 2023-02-22: qty 6.7, 25d supply, fill #0

## 2023-02-22 MED ORDER — PREDNISONE 10 MG PO TABS
ORAL_TABLET | ORAL | 0 refills | Status: AC
Start: 2023-02-22 — End: 2023-03-02
  Filled 2023-02-22: qty 20, 8d supply, fill #0

## 2023-02-22 MED ORDER — AZITHROMYCIN 250 MG PO TABS: ORAL_TABLET | ORAL | 0 refills | Status: AC

## 2023-02-22 MED ORDER — ALBUTEROL SULFATE (2.5 MG/3ML) 0.083% IN NEBU
2.5000 mg | INHALATION_SOLUTION | Freq: Once | RESPIRATORY_TRACT | Status: AC
Start: 2023-02-22 — End: ?

## 2023-02-22 MED ORDER — DOXYCYCLINE HYCLATE 100 MG PO TABS
100.0000 mg | ORAL_TABLET | Freq: Two times a day (BID) | ORAL | 0 refills | Status: AC
Start: 2023-02-22 — End: 2023-03-01

## 2023-02-22 NOTE — Progress Notes (Deleted)
   Acute Office Visit  Subjective:     Patient ID: [name], female    DOB: 20-Apr-1950, 72 y.o.   MRN: [id number]   HPI  [name] is 72 year old female, patient of Sandford Craze, NP, presents today for a acute visit. She had a video visit on 02/14/23 with her PCP for sinusitis and was prescribed Augmentin BID for 10 days.   Today she reports, originally her symptoms started three weeks ago with fever, congestion, cough, ear pain. She was seen on 11/18 and started the antibiotics however she reports that she is feeling worse. She does not have the fever anymore. Congestion, cough, wheezing, shortness of breath with exertion and headaches continues. She did a home covid test yesterday which was negative.   She has tried OTC Delsym and still taking Augmentin.    Review of Systems  HENT:  Positive for congestion, sinus pain and sore throat.   Respiratory:  Positive for cough, shortness of breath and wheezing. Negative for sputum production.   Cardiovascular:  Negative for chest pain.  Neurological:  Positive for headaches.        Objective:    BP 137/69 (BP Location: Right Arm, Patient Position: Sitting, Cuff Size: Normal)   Pulse 95   Temp 99.5 F (37.5 C) (Oral)   Resp 16   Ht 5\' 3"  (1.6 m)   Wt 173 lb (78.5 kg)   LMP 03/29/1993 Comment: 12/25/13 Pt states last cycle has been about 20 yrs ago.  SpO2 91%   BMI 30.65 kg/m  BP Readings from Last 3 Encounters:  02/22/23 137/69  01/04/23 (!) 132/59  07/26/22 122/72      Physical Exam Vitals and nursing note reviewed.  Constitutional:      Appearance: Normal appearance.  HENT:     Right Ear: Tympanic membrane, ear canal and external ear normal.     Left Ear: Tympanic membrane, ear canal and external ear normal.  Cardiovascular:     Rate and Rhythm: Normal rate and regular rhythm.     Pulses: Normal pulses.     Heart sounds: Normal heart sounds.  Pulmonary:     Effort: Pulmonary effort is normal.     Breath  sounds: Wheezing present.  Neurological:     Mental Status: She is alert and oriented to person, place, and time.  Psychiatric:        Mood and Affect: Mood normal.        Behavior: Behavior normal.        Thought Content: Thought content normal.        Judgment: Judgment normal.     No results found for any visits on 02/22/23.      Assessment & Plan:  ***  No follow-ups on file.  Modesto Charon, NP

## 2023-02-22 NOTE — Progress Notes (Signed)
Established Patient Office Visit  Subjective   Patient ID: Kimaya Pipia, female    DOB: February 25, 1951  Age: 72 y.o. MRN: [id number]   HPI  Gwynn Hein is 72 year old female, patient of Sandford Craze, NP, presents today for a acute visit. She had a video visit on 02/14/23 with her PCP for sinusitis and was prescribed Augmentin BID for 10 days.   Today she reports, originally her symptoms started three weeks ago with fever, congestion, cough, ear pain. She was seen on 11/18 and started the antibiotics however she reports that she is feeling worse. She does not have the fever anymore. Congestion, cough, wheezing, shortness of breath with exertion and headaches continues. She did a home covid test two days ago which was negative.   She has tried OTC Delsym and still taking Augmentin has a few days worth left. She has a history of tobacco use but quit back in 1988.  She had a chest x-ray done in February which showed mild persistent or recurrent patchy density in both lower lungs; bronchial thickening; markings could represent a combination of active inflammatory disease and chronic scarring.   Patient Active Problem List   Diagnosis Date Noted   Persistent cough for 3 weeks or longer 02/22/2023   Acute pain of right knee 07/05/2022   Mild intermittent acute asthmatic bronchitis 05/24/2022   Lung nodules 05/10/2022   OSA (obstructive sleep apnea) 05/10/2022   Snoring 03/11/2022   Obesity (BMI 30.0-34.9) 07/07/2021   Effusion of joint of left shoulder 04/21/2021   Preventative health care 12/30/2020   Thrombocytopenia (HCC) 01/09/2020   Sinusitis 10/19/2018   Hypertension    Hypertriglyceridemia 06/15/2015   Allergic rhinitis 10/16/2014   GERD (gastroesophageal reflux disease) 10/16/2014   Reactive airway disease 09/11/2014   Chronic nausea 12/25/2013   Anxiety and depression 12/25/2013   Arachnoiditis    Umbilical hernia 08/09/2011   Past Medical History:  Diagnosis Date   AKI  (acute kidney injury) (HCC) 01/09/2020   Allergy    seasonal   Anxiety    Arachnoiditis    Cataract    Depression    Elevated liver enzymes    Environmental allergies    GERD (gastroesophageal reflux disease)    H/O being hospitalized 03/2012   x1 week for nausea   History of chicken pox    Hyperlipidemia    Hypertension    Migraine with typical aura    resolved years ago   Nausea    chronic nausea    Neuromuscular disorder (HCC)    Pneumonia 03/02/2022   Allergies  Allergen Reactions   Compazine [Prochlorperazine]     hyperactivity   Morphine And Codeine Hives and Itching   Trintellix [Vortioxetine] Nausea And Vomiting         07/05/2022   10:28 AM 04/23/2022    1:04 PM 04/06/2021   11:51 AM  Depression screen PHQ 2/9  Decreased Interest 0 0 0  Down, Depressed, Hopeless 0 0 0  PHQ - 2 Score 0 0 0        No data to display            Review of Systems  Constitutional:  Negative for chills and fever.  HENT:  Positive for congestion, ear pain and sore throat.   Respiratory:  Positive for cough, shortness of breath and wheezing. Negative for sputum production.        Shortness of breath exertion.  Cardiovascular:  Negative for chest pain and  palpitations.  Neurological:  Positive for headaches.      Objective:     BP 137/69 (BP Location: Right Arm, Patient Position: Sitting, Cuff Size: Normal)   Pulse 95   Temp 99.5 F (37.5 C) (Oral)   Resp 16   Ht 5\' 3"  (1.6 m)   Wt 173 lb (78.5 kg)   LMP 03/29/1993 Comment: 12/25/13 Pt states last cycle has been about 20 yrs ago.  SpO2 91%   BMI 30.65 kg/m     Physical Exam Vitals and nursing note reviewed.  Constitutional:      Appearance: Normal appearance.  HENT:     Right Ear: Tympanic membrane, ear canal and external ear normal.     Left Ear: Tympanic membrane, ear canal and external ear normal.     Nose: Nose normal.     Mouth/Throat:     Mouth: Mucous membranes are moist.  Cardiovascular:     Rate  and Rhythm: Normal rate and regular rhythm.     Pulses: Normal pulses.     Heart sounds: Normal heart sounds.  Pulmonary:     Effort: Pulmonary effort is normal.     Breath sounds: Wheezing present.  Neurological:     Mental Status: She is alert and oriented to person, place, and time.  Psychiatric:        Mood and Affect: Mood normal.        Behavior: Behavior normal.        Thought Content: Thought content normal.        Judgment: Judgment normal.      No results found for any visits on 02/22/23.     The 10-year ASCVD risk score (Arnett DK, et al., 2019) is: 17.7%    Assessment & Plan:  Persistent cough for 3 weeks or longer Assessment & Plan: Uncontrolled. Differentials include pneumonia, chronic bronchitis. Reviewed chest x-ray from February 2024. Reviewed notes from pulmonology.   Albuterol 2.5 mg neb treatment given in office today. Tolerated well.   Given the length of her symptoms, Chest x-ray ordered STAT. Will change or add antibiotic therapy once chest x-ray results come back.   Start Prednisone 10 mg 4 tablets (40 mg) for two days, 3 tablets (30 mg) for two days, 2 tablets (20 mg) for two days and 1 tablet (10 mg) for two days.   Start Albuterol Inhaler 2 puffs every 4 hours as needed.   Recommended to scheduled follow up with Pulmonology.   Orders: -     DG Chest 2 View; Future -     Albuterol Sulfate HFA; Inhale 2 puffs into the lungs every 4 (four) hours as needed for shortness of breath.  Dispense: 6.7 g; Refill: 0 -     Albuterol Sulfate  Other orders -     predniSONE; Take 4 tablets (40 mg total) by mouth daily for 2 days, THEN 3 tablets (30 mg total) daily for 2 days, THEN 2 tablets (20 mg total) daily for 2 days, and THEN 1 tablet (10 mg total) daily for 2 days.  Dispense: 20 tablet; Refill: 0     Return if symptoms worsen or fail to improve.    [place]esto Charon, NP

## 2023-02-22 NOTE — Telephone Encounter (Addendum)
Stat Chest x-ray revealed pneumonia. Attempted to call patient to discuss results. Unable to get in touch. Left message asking patient to check my chart message. Asked patient to stop Augmentin. RX sent for Doxycycline 100 mg BID for 7 days and z-pack.  Follow up with PCP in 1 week or sooner if not better or worse.

## 2023-02-22 NOTE — Telephone Encounter (Signed)
Pt called and explained that she was seen for an infection on the 18th and was prescribed amoxicillin. She stated that she doesn't feel that it's working and would like cough medicine instead. Please call and advise pt.

## 2023-02-22 NOTE — Telephone Encounter (Signed)
Patient needs in office visit please.

## 2023-02-22 NOTE — Addendum Note (Signed)
Addended by: [place] Charon on: 02/22/2023 04:26 PM   Modules accepted: Orders

## 2023-02-22 NOTE — Assessment & Plan Note (Addendum)
Uncontrolled. Differentials include pneumonia, chronic bronchitis. Reviewed chest x-ray from February 2024. Reviewed notes from pulmonology.   Albuterol 2.5 mg neb treatment given in office today. Tolerated well.   Given the length of her symptoms, Chest x-ray ordered STAT. Will change or add antibiotic therapy once chest x-ray results come back.   Start Prednisone 10 mg 4 tablets (40 mg) for two days, 3 tablets (30 mg) for two days, 2 tablets (20 mg) for two days and 1 tablet (10 mg) for two days.   Start Albuterol Inhaler 2 puffs every 4 hours as needed.   Recommended to scheduled follow up with Pulmonology.

## 2023-02-22 NOTE — Patient Instructions (Addendum)
Complete xray(s) prior to leaving today. I will notify you of your results once received.   Start Prednisone 10 mg 4 tablets (40 mg) for two days, 3 tablets (30 mg) for two days, 2 tablets (20 mg) for two days and 1 tablet (10 mg) for two days.   Start Albuterol Inhaler 2 puffs every 4 hours as needed.   You can try a few things over the counter to help with your symptoms including:  Cough: Delsym or Robitussin (get the off brand, works just as well) Chest Congestion: Mucinex (plain) Nasal Congestion/Ear Pressure/Sinus Pressure: Try using Flonase (fluticasone) nasal spray. Instill 1 spray in each nostril twice daily. This can be purchased over the counter. Body aches, fevers, headache: Ibuprofen (not to exceed 2400 mg in 24 hours) or Acetaminophen-Tylenol (not to exceed 3000 mg in 24 hours) Runny Nose/Throat Drainage/Sneezing/Itchy or Watery Eyes: An antihistamine such as Zyrtec, Claritin, Xyzal, Allegra  Please schedule follow up with Pulmonology.   It was a pleasure meeting you!

## 2023-02-22 NOTE — Telephone Encounter (Signed)
Patient scheduled to be seen at 1 pm

## 2023-03-09 ENCOUNTER — Ambulatory Visit: Payer: Medicare Other

## 2023-03-09 ENCOUNTER — Ambulatory Visit: Payer: Medicare Other | Admitting: Pulmonary Disease

## 2023-03-09 ENCOUNTER — Encounter: Payer: Self-pay | Admitting: Pulmonary Disease

## 2023-03-09 VITALS — BP 114/60 | HR 76 | Temp 98.4°F | Ht 61.0 in | Wt 171.2 lb

## 2023-03-09 DIAGNOSIS — R0982 Postnasal drip: Secondary | ICD-10-CM

## 2023-03-09 DIAGNOSIS — G4733 Obstructive sleep apnea (adult) (pediatric): Secondary | ICD-10-CM

## 2023-03-09 DIAGNOSIS — R059 Cough, unspecified: Secondary | ICD-10-CM | POA: Diagnosis not present

## 2023-03-09 DIAGNOSIS — K219 Gastro-esophageal reflux disease without esophagitis: Secondary | ICD-10-CM

## 2023-03-09 DIAGNOSIS — J189 Pneumonia, unspecified organism: Secondary | ICD-10-CM | POA: Diagnosis not present

## 2023-03-09 MED ORDER — PREDNISONE 10 MG PO TABS: 40.0000 mg | ORAL_TABLET | Freq: Every day | ORAL | 0 refills | Status: AC

## 2023-03-09 MED ORDER — ESOMEPRAZOLE MAGNESIUM 40 MG PO PACK: 40.0000 mg | PACK | Freq: Two times a day (BID) | ORAL | 12 refills | Status: DC

## 2023-03-09 MED ORDER — FAMOTIDINE 20 MG PO TABS: 20.0000 mg | ORAL_TABLET | Freq: Every day | ORAL | Status: DC

## 2023-03-09 NOTE — Progress Notes (Signed)
[name]    [id number]    1950-09-26  Primary Care Physician:O'Sullivan, Efraim Kaufmann, NP  Referring Physician: Sandford Craze, NP [address] [place],  [place] [address]  Chief complaint: Chronic cough  HPI: 72 y.o. with history of hypertension, allergies, chronic headache, sacral radiculopathy with epidural fibrosis.  Complains of chronic cough for the past 3 months.  Treated with Z-Pak, Tessalon, prednisone taper with no improvement in March 2020, this was followed by Augmentin, albuterol inhaler and second round of prednisone and early April.  She was also evaluated in the ED on 07/10/2019 with chest x-ray showing minimal basal atelectasis.  Has constant irritation, congestion at the back of the throat.  Has uncontrolled rhinitis, postnasal drip and acid reflux for which she takes Flonase [which does not help much], Zyrtec, omeprazole once a day  She has history of severe allergies, rhinitis, postnasal drip.  She had sinus surgery at age 24 which resulted in a nasal perforation.   Pets: 4 cats.  She is allergic to cats Occupation: Retired Environmental health practitioner Exposures: Had mold in the previous home.  She moved out of this home in the year 2000.  No ongoing exposures with mold, hot tub, Jacuzzi.  No down pillows or comforters. Smoking history: 4-pack-year smoker.  Quit in 1990 Travel history: Originally from [place].  Previously lived in [place] Relevant family history: No significant family history of lung disease  Interval history: She developed community-acquired pneumonia in the right lung in November 2024 requiring multiple rounds of antibiotics.  CT showed right lower lobe consolidation which generally resolved on follow-up Overall she is improved  She had a sleep study which showed severe sleep apnea and a CPAP/BiPAP titration and is here for review and discussion.  Outpatient Encounter Medications as of 03/09/2023  Medication Sig    albuterol (VENTOLIN HFA) 108 (90 Base) MCG/ACT inhaler Inhale 2 puffs into the lungs every 4 (four) hours as needed for shortness of breath.   amLODipine (NORVASC) 5 MG tablet TAKE 1 TABLET (5 MG TOTAL) BY MOUTH DAILY.   cetirizine (ZYRTEC) 10 MG tablet Take 10 mg by mouth daily.   clonazePAM (KLONOPIN) 1 MG tablet TAKE 1 TABLET BY MOUTH THREE TIMES A DAY AS NEEDED FOR ANXIETY   escitalopram (LEXAPRO) 10 MG tablet Take 1 tablet (10 mg total) by mouth daily.   esomeprazole (NEXIUM) 40 MG packet Take 40 mg by mouth in the morning and at bedtime.   famotidine (PEPCID) 20 MG tablet Take 1 tablet (20 mg total) by mouth at bedtime.   gabapentin (NEURONTIN) 300 MG capsule Take 1 capsule (300 mg total) by mouth 3 (three) times daily.   mirtazapine (REMERON) 45 MG tablet Take 45 mg by mouth at bedtime.    Misc Natural Products (NEURIVA PO) Take by mouth.   montelukast (SINGULAIR) 10 MG tablet TAKE 1 TABLET BY MOUTH EVERYDAY AT BEDTIME   Multiple Vitamin (MULTIVITAMIN) capsule Take 1 capsule by mouth daily. Dynamic brain   predniSONE (DELTASONE) 10 MG tablet Take 4 tablets (40 mg total) by mouth daily with breakfast for 5 days.   traZODone (DESYREL) 50 MG tablet Take 50-150 mg by mouth at bedtime as needed for sleep.    Facility-Administered Encounter Medications as of 03/09/2023  Medication   albuterol (PROVENTIL) (2.5 MG/3ML) 0.083% nebulizer solution 2.5 mg   Physical Exam: Blood pressure 126/76, pulse 64, temperature 98.4 F (36.9 C), temperature source Oral, height 5\' 1"  (  1.549 m), weight 171 lb 9.6 oz (77.8 kg), last menstrual period 03/29/1993, SpO2 94 %. Gen:      No acute distress HEENT:  EOMI, sclera anicteric Neck:     No masses; no thyromegaly Lungs:    Clear to auscultation bilaterally; normal respiratory effort CV:         Regular rate and rhythm; no murmurs Abd:      + bowel sounds; soft, non-tender; no palpable masses, no distension Ext:    No edema; adequate peripheral  perfusion Skin:      Warm and dry; no rash Neuro: alert and oriented x 3 Psych: normal mood and affect   Data Reviewed: Imaging: Chest x-ray 07/10/2019-minimal left basilar scarring/atelectasis.    High-resolution CT 08/17/2019-no evidence of ILD, mild scarring at the lung base-postinfectious, coronary artery disease, aortic atherosclerosis.  CT chest 02/12/2022-small right effusion, irregular masslike consolidation in the right lower lobe, small pericardial effusion  CT chest 05/04/2022-near complete interval resolution of right lower lobe masslike consolidation cluster of nodules in the posterior right upper lobe measuring 4 mm linear opacity in the right lower lobe, resolution of centrilobular nodules. I have reviewed the images personally.  PFTs 07/25/2019 FVC 2.84 (102%), FEV1 2.42 (115%), F/F 85, TLC 5.19 [110%], DLCO 16.91 [94%] Normal test  Labs: Respiratory allergy profile 5/40/21-IgE 25, RAST panel-negative CBC 07/31/2019-WBC 9.3, eos 4%, absolute eosinophil count 372  Sleep: Home sleep study 04/13/2022-Severe OSA, AHI 47.2, desaturation to 69% Titration study 07/05/2022-trial BiPAP 19/15  Assessment:  Pneumonia Persistent cough and headache despite treatment with Augmentin and azithromycin. No current sputum production. -Repeat chest x-ray today to assess resolution of pneumonia. -Additional course of prednisone.  Gastroesophageal Reflux Disease (GERD) Reports severe acid reflux despite taking omeprazole twice daily. -Change to Nexium twice daily for two weeks. -Add Pepcid at night before sleep.  Postnasal Drip Chronic issue, currently on Zyrtec. -Change to chlorpheniramine 1 tablet three times a day. -Add Nasonex two squirts twice a day.  Follow-up Monitor response to changes in medication regimen.   Severe OSA On Bipap 19/15.    Chronic cough Likely upper airway cough from postnasal drip, rhinitis, GERD. May have mild reactive airway disease given PFTs with no  overt obstruction but improvement in mid flow rates post bronchodilators and mildly elevated peripheral eosinophils. CT reviewed with no significant ILD Management as above  Plan/Recommendations: Chest x-ray, prednisone Nexium, Pepcid Chlorpheniramine, Zyrtec for postnasal drip  Chilton Greathouse MD [facility] 03/09/2023, 1:23 PM  CC: Sandford Craze, NP

## 2023-03-09 NOTE — Patient Instructions (Signed)
VISIT SUMMARY:  During today's visit, we discussed your persistent cough and headache, which have not improved despite completing your previous antibiotic treatment. We also addressed your ongoing issues with acid reflux and postnasal drip.  YOUR PLAN:  -PNEUMONIA: Pneumonia is an infection that inflames the air sacs in one or both lungs. Since your symptoms have not improved, we will repeat a chest x-ray today to check if the pneumonia has resolved. We may also consider another course of prednisone to help with inflammation.  -GASTROESOPHAGEAL REFLUX DISEASE (GERD): GERD is a condition where stomach acid frequently flows back into the tube connecting your mouth and stomach. Since omeprazole has not been effective, we are switching you to Nexium twice daily for two weeks and adding Pepcid at night before sleep to better manage your symptoms.  -POSTNASAL DRIP: Postnasal drip occurs when excess mucus from the nasal passages drips down the back of the throat. We are changing your medication to chlorpheniramine, one tablet three times a day, and adding Nasonex, two squirts twice a day, to help alleviate your symptoms. These medications are available over the counter  INSTRUCTIONS:  Please follow up to monitor your response to the new medication regimen. Ensure you complete the chest x-ray today and report any changes in your symptoms.

## 2023-03-10 ENCOUNTER — Other Ambulatory Visit: Payer: Self-pay | Admitting: Pulmonary Disease

## 2023-03-28 ENCOUNTER — Encounter: Payer: Self-pay | Admitting: Pulmonary Disease

## 2023-04-04 NOTE — Telephone Encounter (Signed)
 Called the pt to discuss her symptoms. Had to [facility].

## 2023-04-11 ENCOUNTER — Other Ambulatory Visit: Payer: Self-pay | Admitting: Family

## 2023-04-11 DIAGNOSIS — J309 Allergic rhinitis, unspecified: Secondary | ICD-10-CM

## 2023-04-16 ENCOUNTER — Encounter (INDEPENDENT_AMBULATORY_CARE_PROVIDER_SITE_OTHER): Payer: Medicare Other | Admitting: Family

## 2023-04-16 DIAGNOSIS — K582 Mixed irritable bowel syndrome: Secondary | ICD-10-CM

## 2023-04-18 DIAGNOSIS — K582 Mixed irritable bowel syndrome: Secondary | ICD-10-CM

## 2023-04-19 MED ORDER — CHOLESTYRAMINE 4 G PO PACK: 4.0000 g | PACK | Freq: Three times a day (TID) | ORAL | 12 refills | Status: DC

## 2023-04-19 NOTE — Addendum Note (Signed)
Addended by: Sandford Craze on: 04/19/2023 10:17 AM   Modules accepted: Orders

## 2023-04-19 NOTE — Telephone Encounter (Signed)
Please see the MyChart message reply(ies) for my assessment and plan.  The patient gave consent for this Medical Advice Message and is aware that it may result in a bill to their insurance company as well as the possibility that this may result in a co-payment or deductible. They are an established patient, but are not seeking medical advice exclusively about a problem treated during an in person or video visit in the last 7 days. I did not recommend an in person or video visit within 7 days of my reply.  I spent a total of 5 minutes cumulative provider time within 7 days through [employer].  Nance Pear, NP

## 2023-04-26 ENCOUNTER — Ambulatory Visit (INDEPENDENT_AMBULATORY_CARE_PROVIDER_SITE_OTHER): Payer: Medicare Other

## 2023-04-26 VITALS — Ht 61.0 in | Wt 166.0 lb

## 2023-04-26 DIAGNOSIS — Z0000 Encounter for general adult medical examination without abnormal findings: Secondary | ICD-10-CM

## 2023-04-26 DIAGNOSIS — Z1231 Encounter for screening mammogram for malignant neoplasm of breast: Secondary | ICD-10-CM

## 2023-04-26 NOTE — Patient Instructions (Addendum)
Ms. Hyden , Thank you for taking time to come for your Medicare Wellness Visit. I appreciate your ongoing commitment to your health goals. Please review the following plan we discussed and let me know if I can assist you in the future.   Referrals/Orders/Follow-Ups/Clinician Recommendations:   This is a list of the screening recommended for you and due dates:  Health Maintenance  Topic Date Due   DTaP/Tdap/Td vaccine (2 - Tdap) 06/07/2018   Mammogram  02/24/2023   Medicare Annual Wellness Visit  04/25/2024   Colon Cancer Screening  08/06/2025   Pneumonia Vaccine  Completed   Flu Shot  Completed   DEXA scan (bone density measurement)  Completed   COVID-19 Vaccine  Completed   Hepatitis C Screening  Completed   Zoster (Shingles) Vaccine  Completed   HPV Vaccine  Aged Out    Advanced directives: (Copy Requested) Please bring a copy of your health care power of attorney and living will to the office to be added to your chart at your convenience.  Next Medicare Annual Wellness Visit scheduled for next year: Yes

## 2023-04-26 NOTE — Progress Notes (Signed)
Subjective:   [name] is a 73 y.o. female who presents for Medicare Annual (Subsequent) preventive examination.  Visit Complete: Virtual I connected with  Letica Giaimo on 04/26/23 by a audio enabled telemedicine application and verified that I am speaking with the correct person using two identifiers.  Patient Location: Home  Provider Location: Home Office  I discussed the limitations of evaluation and management by telemedicine. The patient expressed understanding and agreed to proceed.  Vital Signs: Because this visit was a virtual/telehealth visit, some criteria may be missing or patient reported. Any vitals not documented were not able to be obtained and vitals that have been documented are patient reported.   Cardiac Risk Factors include: advanced age (>70men, >85 women);hypertension     Objective:    Today's Vitals   04/26/23 1309  Weight: 166 lb (75.3 kg)  Height: 5\' 1"  (1.549 m)   Body mass index is 31.37 kg/m.     04/26/2023    1:22 PM 07/05/2022    8:13 PM 04/23/2022    1:04 PM 04/06/2021   11:47 AM 10/06/2020   10:50 AM 10/01/2020   11:14 AM 01/09/2020    2:41 AM  Advanced Directives  Does Patient Have a Medical Advance Directive? Yes No Yes Yes Yes Yes No  Type of Estate agent of [place];Living will  Healthcare Power of [place];Living will Healthcare Power of [employer] of [place];Living will   Does patient want to make changes to medical advance directive?     No - Patient declined No - Patient declined   Copy of Healthcare Power of Attorney in Chart? No - copy requested  No - copy requested No - copy requested     Would patient like information on creating a medical advance directive?  No - Patient declined     No - Patient declined    Current Medications (verified) Outpatient Encounter Medications as of 04/26/2023  Medication Sig   albuterol (VENTOLIN HFA) 108 (90 Base) MCG/ACT inhaler Inhale 2 puffs into the lungs  every 4 (four) hours as needed for shortness of breath.   amLODipine (NORVASC) 5 MG tablet TAKE 1 TABLET (5 MG TOTAL) BY MOUTH DAILY.   cetirizine (ZYRTEC) 10 MG tablet Take 10 mg by mouth daily.   cholestyramine (QUESTRAN) 4 g packet Take 1 packet (4 g total) by mouth 3 (three) times daily with meals. Mix packet with 8 oz of fluid   clonazePAM (KLONOPIN) 1 MG tablet TAKE 1 TABLET BY MOUTH THREE TIMES A DAY AS NEEDED FOR ANXIETY   escitalopram (LEXAPRO) 10 MG tablet Take 1 tablet (10 mg total) by mouth daily.   esomeprazole (NEXIUM) 40 MG capsule Take 1 capsule (40 mg total) by mouth 2 (two) times daily before a meal.   famotidine (PEPCID) 20 MG tablet Take 1 tablet (20 mg total) by mouth at bedtime.   gabapentin (NEURONTIN) 300 MG capsule Take 1 capsule (300 mg total) by mouth 3 (three) times daily.   mirtazapine (REMERON) 45 MG tablet Take 45 mg by mouth at bedtime.    Misc Natural Products (NEURIVA PO) Take by mouth.   montelukast (SINGULAIR) 10 MG tablet Take 1 tablet (10 mg total) by mouth at bedtime.   Multiple Vitamin (MULTIVITAMIN) capsule Take 1 capsule by mouth daily. Dynamic brain   traZODone (DESYREL) 50 MG tablet Take 50-150 mg by mouth at bedtime as needed for sleep.    Facility-Administered Encounter Medications as of 04/26/2023  Medication  albuterol (PROVENTIL) (2.5 MG/3ML) 0.083% nebulizer solution 2.5 mg    Allergies (verified) Compazine [prochlorperazine], Morphine and codeine, and Trintellix [vortioxetine]   History: Past Medical History:  Diagnosis Date   AKI (acute kidney injury) (HCC) 01/09/2020   Allergy    seasonal   Anxiety    Arachnoiditis    Cataract    Depression    Elevated liver enzymes    Environmental allergies    GERD (gastroesophageal reflux disease)    H/O being hospitalized 03/2012   x1 week for nausea   History of chicken pox    Hyperlipidemia    Hypertension    Migraine with typical aura    resolved years ago   Nausea    chronic  nausea    Neuromuscular disorder (HCC)    Pneumonia 03/02/2022   Past Surgical History:  Procedure Laterality Date   ABDOMINOPLASTY  07/11/09   EUS N/A 05/05/2012   Procedure: UPPER ENDOSCOPIC ULTRASOUND (EUS) LINEAR;  Surgeon: Theda Belfast, MD;  Location: [facility];  Service: Endoscopy;  Laterality: N/A;   LAPAROSCOPIC CHOLECYSTECTOMY  2010   NASAL SINUS SURGERY     "Has had 2-3 surgeries over the years"   UMBILICAL HERNIA REPAIR N/A 10/06/2020   Procedure: UMBILICAL HERNIA REPAIR;  Surgeon: Abigail Miyamoto, MD;  Location: [facility];  Service: General;  Laterality: N/A;  LMA ANESTHESIA   Family History  Problem Relation Age of Onset   Alzheimer's disease Mother    Hypertension Mother    Migraines Mother    Hypertension Father    Other Father        ?carotid artery aneurysm   Cancer Brother        mouth and throat   Colon cancer Neg Hx    Social History   Socioeconomic History   Marital status: Married    Spouse name: Not on file   Number of children: Not on file   Years of education: Not on file   Highest education level: Some college, no degree  Occupational History   Not on file  Tobacco Use   Smoking status: Former    Current packs/day: 0.00    Average packs/day: 0.3 packs/day for 8.0 years (2.0 ttl pk-yrs)    Types: Cigarettes    Start date: 08/09/1978    Quit date: 08/09/1986    Years since quitting: 36.7   Smokeless tobacco: Never  Vaping Use   Vaping status: Never Used  Substance and Sexual Activity   Alcohol use: No   Drug use: No   Sexual activity: Not Currently    Partners: Male    Birth control/protection: Other-see comments, Post-menopausal    Comment: husband had vasectomy.   Other Topics Concern   Not on file  Social History Narrative   2 step daughters- 79 and 61   She worked as an Environmental health practitioner.   Enjoys puzzles, counted cross stitch.   Completed 1 year of college   4 cats   Social Drivers of Health    Financial Resource Strain: Low Risk  (04/26/2023)   Overall Financial Resource Strain (CARDIA)    Difficulty of Paying Living Expenses: Not hard at all  Food Insecurity: No Food Insecurity (04/26/2023)   Hunger Vital Sign    Worried About Running Out of Food in the Last Year: Never true    Ran Out of Food in the Last Year: Never true  Transportation Needs: No Transportation Needs (04/26/2023)   PRAPARE - Transportation  Lack of Transportation (Medical): No    Lack of Transportation (Non-Medical): No  Physical Activity: Inactive (04/26/2023)   Exercise Vital Sign    Days of Exercise per Week: 0 days    Minutes of Exercise per Session: 0 min  Stress: No Stress Concern Present (04/26/2023)   [name] of Occupational Health - Occupational Stress Questionnaire    Feeling of Stress : Not at all  Social Connections: Moderately Isolated (04/26/2023)   Social Connection and Isolation Panel [NHANES]    Frequency of Communication with Friends and Family: More than three times a week    Frequency of Social Gatherings with Friends and Family: More than three times a week    Attends Religious Services: Never    Database administrator or Organizations: No    Attends Engineer, structural: Never    Marital Status: Married    Tobacco Counseling Counseling given: Not Answered   Clinical Intake:  Pre-visit preparation completed: Yes  Pain : No/denies pain     BMI - recorded: 31.37 Nutritional Status: BMI > 30  Obese Nutritional Risks: None Diabetes: No  How often do you need to have someone help you when you read instructions, pamphlets, or other written materials from your doctor or pharmacy?: 1 - Never  Interpreter Needed?: No  Information entered by :: Theresa Mulligan LPN   Activities of Daily Living    04/26/2023    1:18 PM  In your present state of health, do you have any difficulty performing the following activities:  Hearing? 0  Vision? 0  Difficulty  concentrating or making decisions? 0  Walking or climbing stairs? 0  Dressing or bathing? 0  Doing errands, shopping? 0  Preparing Food and eating ? N  Using the Toilet? N  In the past six months, have you accidently leaked urine? N  Do you have problems with loss of bowel control? N  Managing your Medications? N  Managing your Finances? N  Housekeeping or managing your Housekeeping? N    Patient Care Team: Sandford Craze, NP as PCP - General (Internal Medicine) Alfredia Ferguson, MD (Inactive) as Referring Physician (Plastic Surgery) Sarita Bottom, MD as Referring Physician (Neurology) Charna Elizabeth, MD as Consulting Physician (Gastroenterology)  Indicate any recent Medical Services you may have received from other than [facility] providers in the past year (date may be approximate).     Assessment:   This is a routine wellness examination for Allecia.  Hearing/Vision screen Hearing Screening - Comments:: Denies hearing difficulties   Vision Screening - Comments:: Wears rx glasses - up to date with routine eye exams with  [facility]   Goals Addressed               This Visit's Progress     Live a long life (pt-stated)         Depression Screen    04/26/2023    1:15 PM 07/05/2022   10:28 AM 04/23/2022    1:04 PM 04/06/2021   11:51 AM 08/05/2020    9:36 AM 09/20/2019   11:50 AM 06/27/2018    9:51 AM  PHQ 2/9 Scores  PHQ - 2 Score 0 0 0 0 2 1 2   PHQ- 9 Score     8  10    Fall Risk    04/26/2023    1:20 PM 03/09/2023   12:57 PM 07/05/2022   10:27 AM 04/16/2022    8:57 AM 04/06/2021   11:49 AM  Fall Risk   Falls in the past year? 0 0 1 0 0  Number falls in past yr: 0  0 0 0  Injury with Fall? 0  0 0 0  Risk for fall due to : No Fall Risks  No Fall Risks No Fall Risks   Follow up Falls prevention discussed  Falls evaluation completed Falls evaluation completed Falls prevention discussed    MEDICARE RISK AT HOME: Medicare Risk at Home Any stairs in or around the  home?: Yes If so, are there any without handrails?: No Home free of loose throw rugs in walkways, pet beds, electrical cords, etc?: Yes Adequate lighting in your home to reduce risk of falls?: Yes Life alert?: No Use of a cane, walker or w/c?: Yes Grab bars in the bathroom?: Yes Shower chair or bench in shower?: No Elevated toilet seat or a handicapped toilet?: Yes  TIMED UP AND GO:  Was the test performed?  No    Cognitive Function:    09/09/2017   11:37 AM  MMSE - Mini Mental State Exam  Orientation to time 5  Orientation to Place 5  Registration 3  Attention/ Calculation 5  Recall 3  Language- name 2 objects 2  Language- repeat 1  Language- follow 3 step command 3  Language- read & follow direction 1  Write a sentence 1  Copy design 1  Total score 30        04/26/2023    1:22 PM 04/23/2022    1:09 PM  6CIT Screen  What Year? 0 points 0 points  What month? 0 points 0 points  What time? 0 points 0 points  Count back from 20 0 points 0 points  Months in reverse 0 points 0 points  Repeat phrase 0 points 0 points  Total Score 0 points 0 points    Immunizations Immunization History  Administered Date(s) Administered   Fluad Quad(high Dose 65+) 01/01/2021, 12/19/2021   Influenza, High Dose Seasonal PF 01/09/2016, 12/15/2016, 12/14/2017, 12/01/2018, 11/21/2022   Influenza,inj,Quad PF,6+ Mos 12/25/2013, 12/13/2014   Influenza-Unspecified 12/05/2019, 12/19/2021   Moderna Covid-19 Fall Seasonal Vaccine 55yrs & older 11/21/2022   Moderna Sars-Covid-2 Vaccination 12/19/2021   PFIZER(Purple Top)SARS-COV-2 Vaccination 05/03/2019, 05/24/2019, 12/05/2019   Pfizer Covid-19 Vaccine Bivalent Booster 53yrs & up 01/17/2021   Pfizer(Comirnaty)Fall Seasonal Vaccine 12 years and older 12/27/2021   Pneumococcal Conjugate-13 02/17/2018   Pneumococcal Polysaccharide-23 12/13/2014, 03/02/2022   RSV,unspecified 12/19/2021   Td 06/06/2008   Zoster Recombinant(Shingrix) 12/11/2018,  03/26/2019   Zoster, Live 01/15/2011    TDAP status: Due, Education has been provided regarding the importance of this vaccine. Advised may receive this vaccine at local pharmacy or Health Dept. Aware to provide a copy of the vaccination record if obtained from local pharmacy or Health Dept. Verbalized acceptance and understanding.  Flu Vaccine status: Up to date  Pneumococcal vaccine status: Up to date  Covid-19 vaccine status: Completed vaccines  Qualifies for Shingles Vaccine? Yes   Zostavax completed Yes   Shingrix Completed?: Yes  Screening Tests Health Maintenance  Topic Date Due   DTaP/Tdap/Td (2 - Tdap) 06/07/2018   MAMMOGRAM  02/24/2023   Medicare Annual Wellness (AWV)  04/25/2024   Colonoscopy  08/06/2025   Pneumonia Vaccine 62+ Years old  Completed   INFLUENZA VACCINE  Completed   DEXA SCAN  Completed   COVID-19 Vaccine  Completed   Hepatitis C Screening  Completed   Zoster Vaccines- Shingrix  Completed   HPV VACCINES  Aged  Out    Health Maintenance  Health Maintenance Due  Topic Date Due   DTaP/Tdap/Td (2 - Tdap) 06/07/2018   MAMMOGRAM  02/24/2023    Colorectal cancer screening: Type of screening: Colonoscopy. Completed 08/07/15. Repeat every 10 years  Mammogram status: Ordered 04/26/23. Pt provided with contact info and advised to call to schedule appt.   Bone Density status: Completed 05/05/21. Results reflect: Bone density results: OSTEOPENIA. Repeat every   years.     Additional Screening:  Hepatitis C Screening: does qualify; Completed 06/13/15  Vision Screening: Recommended annual ophthalmology exams for early detection of glaucoma and other disorders of the eye. Is the patient up to date with their annual eye exam?  Yes  Who is the provider or what is the name of the office in which the patient attends annual eye exams? [facility] If pt is not established with a provider, would they like to be referred to a provider to establish care? No .    Dental Screening: Recommended annual dental exams for proper oral hygiene    Community Resource Referral / Chronic Care Management:  CRR required this visit?  No   CCM required this visit?  No     Plan:     I have personally reviewed and noted the following in the patient's chart:   Medical and social history Use of alcohol, tobacco or illicit drugs  Current medications and supplements including opioid prescriptions. Patient is not currently taking opioid prescriptions. Functional ability and status Nutritional status Physical activity Advanced directives List of other physicians Hospitalizations, surgeries, and ER visits in previous 12 months Vitals Screenings to include cognitive, depression, and falls Referrals and appointments  In addition, I have reviewed and discussed with patient certain preventive protocols, quality metrics, and best practice recommendations. A written personalized care plan for preventive services as well as general preventive health recommendations were provided to patient.     Tillie Rung, LPN   1/61/0960   After Visit Summary: (MyChart) Due to this being a telephonic visit, the after visit summary with patients personalized plan was offered to patient via MyChart   Nurse Notes: None

## 2023-05-02 ENCOUNTER — Telehealth: Payer: Self-pay | Admitting: Family

## 2023-05-02 ENCOUNTER — Ambulatory Visit: Payer: Self-pay | Admitting: Family

## 2023-05-02 NOTE — Telephone Encounter (Signed)
Copied from CRM [id number]. Topic: Clinical - Red Word Triage >> May 02, 2023 11:19 AM Adele Barthel wrote: Red Word that prompted transfer to Nurse Triage: Patient has history of spinal arachnoiditis and her pain has been getting worse. Pain is 7 out of 10, yesterday she could barely move and pain level was a 10. Having hard time with mobility and sometimes requires help getting out bed. She is also experiencing breathing issues, heavy chest, coughing, headache, wheezing during the day/night(can hear the sound in the throat).Diagnosed with pneumonia 2 months ago, symptoms will improve for a day or 2 and then return.

## 2023-05-02 NOTE — Telephone Encounter (Signed)
Reason for Disposition . SEVERE coughing spells (e.g., whooping sound after coughing, vomiting after coughing)  Answer Assessment - Initial Assessment Questions 1. LOCATION: "Where does it hurt?"       Below throat between breasts 2. RADIATION: "Does the pain go anywhere else?" (e.g., into neck, jaw, arms, back)     No  3. ONSET: "When did the chest pain begin?" (Minutes, hours or days)      Months ongoing  4. PATTERN: "Does the pain come and go, or has it been constant since it started?"  "Does it get worse with exertion?"       5. DURATION: "How long does it last" (e.g., seconds, minutes, hours)     Ongoing for months  6. SEVERITY: "How bad is the pain?"  (e.g., Scale 1-10; mild, moderate, or severe)    - MILD (1-3): doesn't interfere with normal activities     - MODERATE (4-7): interferes with normal activities or awakens from sleep    - SEVERE (8-10): excruciating pain, unable to do any normal activities        7. CARDIAC RISK FACTORS: "Do you have any history of heart problems or risk factors for heart disease?" (e.g., angina, prior heart attack; diabetes, high blood pressure, high cholesterol, smoker, or strong family history of heart disease)     History  8. PULMONARY RISK FACTORS: "Do you have any history of lung disease?"  (e.g., blood clots in lung, asthma, emphysema, birth control pills)    Recent Pneumonia  9. CAUSE: "What do you think is causing the chest pain?"     Pneumonia congestion  10. OTHER SYMPTOMS: "Do you have any other symptoms?" (e.g., dizziness, nausea, vomiting, sweating, fever, difficulty breathing, cough)       Heaviness like someone is putting pressure on chest Coughing throughout the middle of the night Yesterday was most horrible day of her life  Headache everyday worse with coughing  Answer Assessment - Initial Assessment Questions 1. ONSET: "When did the cough begin?"      Ongoing for one month  2. SEVERITY: "How bad is the cough today?"       Constant  3. SPUTUM: "Describe the color of your sputum" (none, dry cough; clear, white, yellow, green)     Unable to cough it up  4. HEMOPTYSIS: "Are you coughing up any blood?" If so ask: "How much?" (flecks, streaks, tablespoons, etc.)     none 5. DIFFICULTY BREATHING: "Are you having difficulty breathing?" If Yes, ask: "How bad is it?" (e.g., mild, moderate, severe)    - MILD: No SOB at rest, mild SOB with walking, speaks normally in sentences, can lie down, no retractions, pulse < 100.    - MODERATE: SOB at rest, SOB with minimal exertion and prefers to sit, cannot lie down flat, speaks in phrases, mild retractions, audible wheezing, pulse 100-120.    - SEVERE: Very SOB at rest, speaks in single words, struggling to breathe, sitting hunched forward, retractions, pulse > 120      No shortness of breath; wheezing yesterday  6. FEVER: "Do you have a fever?" If Yes, ask: "What is your temperature, how was it measured, and when did it start?"     No   7. CARDIAC HISTORY: "Do you have any history of heart disease?" (e.g., heart attack, congestive heart failure)      Hypertension  8. LUNG HISTORY: "Do you have any history of lung disease?"  (e.g., pulmonary embolus, asthma, emphysema)  Recent diagnosis of pneumonia  10. OTHER SYMPTOMS: "Do you have any other symptoms?" (e.g., runny nose, wheezing, chest pain)       Chest pressure, wheezing yesterday  Protocols used: Chest Pain-A-AH, Cough - Acute Productive-A-AH

## 2023-05-02 NOTE — Telephone Encounter (Signed)
Copied from CRM [id number]. Topic: Clinical - Request for Lab/Test Order >> May 02, 2023 11:15 AM Adele Barthel wrote: Reason for CRM: Patient is requesting a full work up, with urinalysis and blood work. CB# [phone]

## 2023-05-02 NOTE — Telephone Encounter (Addendum)
Copied from CRM [id number]. Topic: Clinical - Red Word Triage >> May 02, 2023 11:19 AM Adele Barthel wrote: Red Word that prompted transfer to Nurse Triage: Patient has history of spinal arachnoiditis and her pain has been getting worse. Pain is 7 out of 10, yesterday she could barely move and pain level was a 10. Having hard time with mobility and sometimes requires help getting out bed. She is also experiencing breathing issues, heavy chest, coughing, headache, wheezing during the day/night(can hear the sound in the throat).Diagnosed with pneumonia 2 months ago, symptoms will improve for a day or 2 and then return.    Chief Complaint: severe cough Symptoms: chest congestion that feels like heaviness, headache worse with coughing  Frequency: greater than 1 month Pertinent Negatives: Patient denies shortness of breath or wheezing at time of triage  Disposition: [] ED /[] Urgent Care (no appt availability in office) / [x] Appointment(In office/virtual)/ []  [facility]/ [] Home Care/ [] Refused Recommended Disposition /[] [facility]/ []  Follow-up with PCP Additional Notes: The patient reported symptoms related to her diagnosis of pneumonia that have been ongoing greater than 1 month.  She has a severe cough, a headache that worsens with coughing, chest congestion that feels like pressure.  She reported that the chest pressure is ongoing and unchanged.  She is unable to cogh up any phlegm but feels the congestion in her chest.  Her cough is causing her chronic pain to worsen.  She has history of spinal arachnoiditis. She requested to see her pcp for follow up for further treatment recommendations as she is tired of feeling this way.  She was scheduled for a next day appointment.

## 2023-05-03 ENCOUNTER — Ambulatory Visit (INDEPENDENT_AMBULATORY_CARE_PROVIDER_SITE_OTHER): Payer: Medicare Other | Admitting: Family

## 2023-05-03 VITALS — BP 122/58 | HR 61 | Temp 98.1°F | Resp 16 | Ht 61.0 in | Wt 170.0 lb

## 2023-05-03 DIAGNOSIS — G039 Meningitis, unspecified: Secondary | ICD-10-CM | POA: Diagnosis not present

## 2023-05-03 DIAGNOSIS — R5383 Other fatigue: Secondary | ICD-10-CM

## 2023-05-03 DIAGNOSIS — Z79899 Other long term (current) drug therapy: Secondary | ICD-10-CM

## 2023-05-03 DIAGNOSIS — R053 Chronic cough: Secondary | ICD-10-CM | POA: Diagnosis not present

## 2023-05-03 MED ORDER — PREGABALIN 75 MG PO CAPS: 75.0000 mg | ORAL_CAPSULE | Freq: Two times a day (BID) | ORAL | 0 refills | Status: DC

## 2023-05-03 NOTE — Progress Notes (Signed)
 Subjective:     Patient ID: [name], female    DOB: 07/09/1950, 73 y.o.   MRN: [id number]  Chief Complaint  Patient presents with   Cough    Patient complains of persistent cough on and off   Tinnitus    Patient complains of left ear ringing     Cough    Discussed the use of AI scribe software for clinical note transcription with the patient, who gave verbal consent to proceed.  History of Present Illness   [name] is a 73 year old female with arachnoiditis who presents with worsening pain and functional limitations. She is accompanied by her husbandl who insisted on her coming to the appointment.  She experiences severe pain due to arachnoiditis, radiating from her lower back down to her ankles and up to her shoulder blades. The pain is described as 'agonizing' and significantly impacts her mobility, sometimes requiring assistance to move from the couch or bed. Her condition is progressively worsening, and she has not been seeing a specialist since her previous provider retired. She also reports sharp pain in her tailbone, necessitating frequent position changes.  She has a history of pneumonia lasting two months, during which she was treated with two rounds of antibiotics and cough medicines. Despite treatment, she continues to experience hoarseness, ear ringing, and headaches. She has a persistent cough for the past three months, which is improving and which she plans to review in follow up with her pulmonologist.   Husband concerned about her thyroid  function, as it has not been checked in a long time, and she experiences fatigue.   She has been on gabapentin , which was increased from 200 mg, and reports that it initially helped but is now less effective. She has previously been on hydrocodone  and fentanyl , which she discontinued due to side effects and ineffectiveness over time. She also takes clonazepam , prescribed by another provider (her psychiatrist Dr. Emilio Aurora).  She and her husband are concerned that her weight is concentrated in her  middle, which she believes contributes to her back issues. She struggles with mobility due to pain and weight, making it difficult to exercise, although she has been trying to eat less to manage her weight.     Lab Results  Component Value Date   CHOL 199 01/04/2023   HDL 50.20 01/04/2023   LDLCALC 112 (H) 01/04/2023   LDLDIRECT 110.0 12/30/2020   TRIG 184.0 (H) 01/04/2023   CHOLHDL 4 01/04/2023       Health Maintenance Due  Topic Date Due   DTaP/Tdap/Td (2 - Tdap) 06/07/2018   MAMMOGRAM  02/24/2023    Past Medical History:  Diagnosis Date   AKI (acute kidney injury) (HCC) 01/09/2020   Allergy    seasonal   Anxiety    Arachnoiditis    Cataract    Depression    Elevated liver enzymes    Environmental allergies    GERD (gastroesophageal reflux disease)    H/O being hospitalized 03/2012   x1 week for nausea   History of chicken pox    Hyperlipidemia    Hypertension    Migraine with typical aura    resolved years ago   Nausea    chronic nausea    Neuromuscular disorder (HCC)    Pneumonia 03/02/2022    Past Surgical History:  Procedure Laterality Date   ABDOMINOPLASTY  07/11/09   EUS N/A 05/05/2012   Procedure: UPPER ENDOSCOPIC ULTRASOUND (EUS) LINEAR;  Surgeon: Belvie JONETTA Just,  MD;  Location: [facility];  Service: Endoscopy;  Laterality: N/A;   LAPAROSCOPIC CHOLECYSTECTOMY  2010   NASAL SINUS SURGERY     Has had 2-3 surgeries over the years   UMBILICAL HERNIA REPAIR N/A 10/06/2020   Procedure: UMBILICAL HERNIA REPAIR;  Surgeon: Vernetta Berg, MD;  Location: [facility];  Service: General;  Laterality: N/A;  LMA ANESTHESIA    Family History  Problem Relation Age of Onset   Alzheimer's disease Mother    Hypertension Mother    Migraines Mother    Hypertension Father    Other Father        ?carotid artery aneurysm   Cancer Brother        mouth and throat    Colon cancer Neg Hx     Social History   Socioeconomic History   Marital status: Married    Spouse name: Not on file   Number of children: Not on file   Years of education: Not on file   Highest education level: Some college, no degree  Occupational History   Not on file  Tobacco Use   Smoking status: Former    Current packs/day: 0.00    Average packs/day: 0.3 packs/day for 8.0 years (2.0 ttl pk-yrs)    Types: Cigarettes    Start date: 08/09/1978    Quit date: 08/09/1986    Years since quitting: 36.7   Smokeless tobacco: Never  Vaping Use   Vaping status: Never Used  Substance and Sexual Activity   Alcohol use: No   Drug use: No   Sexual activity: Not Currently    Partners: Male    Birth control/protection: Other-see comments, Post-menopausal    Comment: husband had vasectomy.   Other Topics Concern   Not on file  Social History Narrative   2 step daughters- 45 and 5   She worked as an environmental health practitioner.   Enjoys puzzles, counted cross stitch.   Completed 1 year of college   4 cats   Social Drivers of Health   Financial Resource Strain: Low Risk  (05/03/2023)   Overall Financial Resource Strain (CARDIA)    Difficulty of Paying Living Expenses: Not hard at all  Food Insecurity: No Food Insecurity (05/03/2023)   Hunger Vital Sign    Worried About Running Out of Food in the Last Year: Never true    Ran Out of Food in the Last Year: Never true  Transportation Needs: No Transportation Needs (05/03/2023)   PRAPARE - Administrator, Civil Service (Medical): No    Lack of Transportation (Non-Medical): No  Physical Activity: Inactive (05/03/2023)   Exercise Vital Sign    Days of Exercise per Week: 0 days    Minutes of Exercise per Session: 0 min  Stress: Stress Concern Present (05/03/2023)   [employer] of Occupational Health - Occupational Stress Questionnaire    Feeling of Stress : Rather much  Social Connections: Unknown (05/03/2023)   Social  Connection and Isolation Panel [NHANES]    Frequency of Communication with Friends and Family: Twice a week    Frequency of Social Gatherings with Friends and Family: Patient declined    Attends Religious Services: Never    Database Administrator or Organizations: No    Attends Banker Meetings: Never    Marital Status: Married  Recent Concern: Social Connections - Moderately Isolated (04/26/2023)   Social Connection and Isolation Panel [NHANES]    Frequency of Communication with Friends and Family:  More than three times a week    Frequency of Social Gatherings with Friends and Family: More than three times a week    Attends Religious Services: Never    Database Administrator or Organizations: No    Attends Banker Meetings: Never    Marital Status: Married  Catering Manager Violence: Not At Risk (04/26/2023)   Humiliation, Afraid, Rape, and Kick questionnaire    Fear of Current or Ex-Partner: No    Emotionally Abused: No    Physically Abused: No    Sexually Abused: No    Outpatient Medications Prior to Visit  Medication Sig Dispense Refill   amLODipine  (NORVASC ) 5 MG tablet TAKE 1 TABLET (5 MG TOTAL) BY MOUTH DAILY. 90 tablet 1   cetirizine  (ZYRTEC ) 10 MG tablet Take 10 mg by mouth daily.     clonazePAM  (KLONOPIN ) 1 MG tablet TAKE 1 TABLET BY MOUTH THREE TIMES A DAY AS NEEDED FOR ANXIETY 90 tablet 0   escitalopram  (LEXAPRO ) 10 MG tablet Take 1 tablet (10 mg total) by mouth daily.     esomeprazole  (NEXIUM ) 40 MG capsule Take 1 capsule (40 mg total) by mouth 2 (two) times daily before a meal. 60 capsule 2   famotidine  (PEPCID ) 20 MG tablet Take 1 tablet (20 mg total) by mouth at bedtime.     mirtazapine  (REMERON ) 45 MG tablet Take 45 mg by mouth at bedtime.      Misc Natural Products (NEURIVA PO) Take by mouth.     montelukast  (SINGULAIR ) 10 MG tablet Take 1 tablet (10 mg total) by mouth at bedtime. 90 tablet 0   Multiple Vitamin (MULTIVITAMIN) capsule Take 1  capsule by mouth daily. Dynamic brain     traZODone  (DESYREL ) 50 MG tablet Take 50-150 mg by mouth at bedtime as needed for sleep.      gabapentin  (NEURONTIN ) 300 MG capsule Take 1 capsule (300 mg total) by mouth 3 (three) times daily. 270 capsule 1   albuterol  (VENTOLIN  HFA) 108 (90 Base) MCG/ACT inhaler Inhale 2 puffs into the lungs every 4 (four) hours as needed for shortness of breath. 6.7 g 0   cholestyramine  (QUESTRAN ) 4 g packet Take 1 packet (4 g total) by mouth 3 (three) times daily with meals. Mix packet with 8 oz of fluid 60 each 12   Facility-Administered Medications Prior to Visit  Medication Dose Route Frequency Provider Last Rate Last Admin   albuterol  (PROVENTIL ) (2.5 MG/3ML) 0.083% nebulizer solution 2.5 mg  2.5 mg Nebulization Once         Allergies  Allergen Reactions   Compazine [Prochlorperazine]     hyperactivity   Morphine And Codeine  Hives and Itching   Trintellix [Vortioxetine] Nausea And Vomiting    Review of Systems  Respiratory:  Positive for cough.       See HPI Objective:    Physical Exam Constitutional:      General: She is not in acute distress.    Appearance: Normal appearance. She is well-developed.  HENT:     Head: Normocephalic and atraumatic.     Right Ear: Tympanic membrane, ear canal and external ear normal.     Left Ear: Tympanic membrane, ear canal and external ear normal.  Eyes:     General: No scleral icterus. Neck:     Thyroid : No thyromegaly.  Cardiovascular:     Rate and Rhythm: Normal rate and regular rhythm.     Heart sounds: Normal heart sounds. No murmur heard. Pulmonary:  Effort: Pulmonary effort is normal. No respiratory distress.     Breath sounds: Normal breath sounds. No wheezing.  Musculoskeletal:     Cervical back: Neck supple.  Skin:    General: Skin is warm and dry.  Neurological:     Mental Status: She is alert and oriented to person, place, and time.  Psychiatric:        Mood and Affect: Mood normal.         Behavior: Behavior normal.        Thought Content: Thought content normal.        Judgment: Judgment normal.      BP (!) 122/58 (BP Location: Right Arm, Patient Position: Sitting, Cuff Size: Normal)   Pulse 61   Temp 98.1 F (36.7 C) (Oral)   Resp 16   Ht 5' 1 (1.549 m)   Wt 170 lb (77.1 kg)   LMP 03/29/1993 Comment: 12/25/13 Pt states last cycle has been about 20 yrs ago.  SpO2 94%   BMI 32.12 kg/m  Wt Readings from Last 3 Encounters:  05/03/23 170 lb (77.1 kg)  04/26/23 166 lb (75.3 kg)  03/09/23 171 lb 3.2 oz (77.7 kg)       Assessment & Plan:   Problem List Items Addressed This Visit       Unprioritized   Persistent cough for 3 weeks or longer   Improving.  Pt is advised pt to keep upcoming appointment for follow up with pulmonology.       Arachnoiditis   Pain uncontrolled despite gabapentin . Will d/c gabapentin  and give trial of lyrica . Controlled substance contract is signed and will update UDS.       Other Visit Diagnoses       Fatigue, unspecified type    -  Primary   Relevant Orders   TSH   Comp Met (CMET)   CBC w/Diff     High risk medication use       Relevant Orders   DRUG MONITORING, PANEL 8 WITH CONFIRMATION, URINE       I have discontinued Darice Pane gabapentin , albuterol , and cholestyramine . I am also having her start on pregabalin . Additionally, I am having her maintain her clonazePAM , traZODone , mirtazapine , multivitamin, escitalopram , amLODipine , Misc Natural Products (NEURIVA PO), cetirizine , famotidine , esomeprazole , and montelukast . We will continue to administer albuterol .  Meds ordered this encounter  Medications   pregabalin  (LYRICA ) 75 MG capsule    Sig: Take 1 capsule (75 mg total) by mouth 2 (two) times daily.    Dispense:  60 capsule    Refill:  0    Supervising Provider:   DOMENICA BLACKBIRD A [[id number]]

## 2023-05-03 NOTE — Assessment & Plan Note (Signed)
Pain uncontrolled despite gabapentin. Will d/c gabapentin and give trial of lyrica. Controlled substance contract is signed and will update UDS.

## 2023-05-03 NOTE — Patient Instructions (Signed)
 VISIT SUMMARY:  Today, we discussed several of your ongoing health concerns, including your chronic pain from arachnoiditis, persistent cough, thyroid  function, cholesterol levels, weight management, and other symptoms. We have made some changes to your medication and ordered tests to better understand your current health status.  YOUR PLAN:  -ARACHNOIDITIS: Arachnoiditis is a painful condition caused by inflammation of the arachnoid, one of the membranes that surround and protect the nerves of the spinal cord. We will discontinue Gabapentin  and start you on Lyrica  to help manage your pain. Please sign the controlled substance contract for Lyrica .  -CHRONIC COUGH: You have had a persistent cough for three months. Although your lung examination was normal, it is important to keep your upcoming appointment with the pulmonologist for further evaluation.  -THYROID  FUNCTION: We will check your thyroid  function with a complete blood panel, as it has not been tested recently and you have been experiencing fatigue.  -HYPERLIPIDEMIA: Hyperlipidemia means having high levels of lipids (fats) in your blood, which can increase the risk of heart disease. We will include a lipid panel in your blood tests to monitor your cholesterol levels.  -WEIGHT MANAGEMENT: We discussed your weight management, especially the difficulty in exercising due to pain. We talked about the potential future use of Ozempic for weight loss, but noted current insurance limitations. Continue to focus on your diet and increase physical activity as much as you can tolerate.  -TINNITUS: Tinnitus is the perception of noise or ringing in the ears. Although no specific plan was discussed, reassurance was provided.  INSTRUCTIONS:  Please follow up in 3 months to assess your response to Lyrica  and review the results of your blood tests. Keep your upcoming appointment with the pulmonologist for further evaluation of your chronic cough.

## 2023-05-03 NOTE — Assessment & Plan Note (Signed)
Improving.  Pt is advised pt to keep upcoming appointment for follow up with pulmonology.

## 2023-05-04 LAB — CBC WITH DIFFERENTIAL/PLATELET
Basophils Absolute: 0.2 10*3/uL — ABNORMAL HIGH (ref 0.0–0.1)
Basophils Relative: 2 % (ref 0.0–3.0)
Eosinophils Absolute: 0.5 10*3/uL (ref 0.0–0.7)
Eosinophils Relative: 4.8 % (ref 0.0–5.0)
HCT: 39.1 % (ref 36.0–46.0)
Hemoglobin: 13.2 g/dL (ref 12.0–15.0)
Lymphocytes Relative: 31.1 % (ref 12.0–46.0)
Lymphs Abs: 3 10*3/uL (ref 0.7–4.0)
MCHC: 33.7 g/dL (ref 30.0–36.0)
MCV: 92.6 fL (ref 78.0–100.0)
Monocytes Absolute: 0.8 10*3/uL (ref 0.1–1.0)
Monocytes Relative: 8.2 % (ref 3.0–12.0)
Neutro Abs: 5.1 10*3/uL (ref 1.4–7.7)
Neutrophils Relative %: 53.9 % (ref 43.0–77.0)
Platelets: 150 10*3/uL (ref 150.0–400.0)
RBC: 4.23 Mil/uL (ref 3.87–5.11)
RDW: 14.1 % (ref 11.5–15.5)
WBC: 9.6 10*3/uL (ref 4.0–10.5)

## 2023-05-04 LAB — COMPREHENSIVE METABOLIC PANEL
ALT: 13 U/L (ref 0–35)
AST: 16 U/L (ref 0–37)
Albumin: 3.8 g/dL (ref 3.5–5.2)
Alkaline Phosphatase: 70 U/L (ref 39–117)
BUN: 16 mg/dL (ref 6–23)
CO2: 25 meq/L (ref 19–32)
Calcium: 8.8 mg/dL (ref 8.4–10.5)
Chloride: 106 meq/L (ref 96–112)
Creatinine, Ser: 0.94 mg/dL (ref 0.40–1.20)
GFR: 60.69 mL/min (ref >60.00)
Glucose, Bld: 77 mg/dL (ref 70–99)
Potassium: 4.1 meq/L (ref 3.5–5.1)
Sodium: 141 meq/L (ref 135–145)
Total Bilirubin: 0.3 mg/dL (ref 0.2–1.2)
Total Protein: 6.7 g/dL (ref 6.0–8.3)

## 2023-05-04 LAB — TSH: TSH: 2.33 u[IU]/mL (ref 0.35–5.50)

## 2023-05-05 ENCOUNTER — Encounter: Payer: Self-pay | Admitting: Nurse Practitioner

## 2023-05-05 ENCOUNTER — Telehealth: Payer: Self-pay | Admitting: Family

## 2023-05-05 LAB — DRUG MONITORING, PANEL 8 WITH CONFIRMATION, URINE
6 Acetylmorphine: NEGATIVE ng/mL (ref <10)
Alcohol Metabolites: NEGATIVE ng/mL (ref <500)
Alphahydroxyalprazolam: NEGATIVE ng/mL (ref <25)
Alphahydroxymidazolam: NEGATIVE ng/mL (ref <50)
Alphahydroxytriazolam: NEGATIVE ng/mL (ref <50)
Aminoclonazepam: 1051 ng/mL — ABNORMAL HIGH (ref <25)
Amphetamines: NEGATIVE ng/mL (ref <500)
Benzodiazepines: POSITIVE ng/mL — AB (ref <100)
Buprenorphine, Urine: NEGATIVE ng/mL (ref <5)
Cocaine Metabolite: NEGATIVE ng/mL (ref <150)
Creatinine: 112.2 mg/dL (ref >=20.0)
Hydroxyethylflurazepam: NEGATIVE ng/mL (ref <50)
Lorazepam: NEGATIVE ng/mL (ref <50)
MDMA: NEGATIVE ng/mL (ref <500)
Marijuana Metabolite: NEGATIVE ng/mL (ref <20)
Nordiazepam: NEGATIVE ng/mL (ref <50)
Opiates: NEGATIVE ng/mL (ref <100)
Oxazepam: NEGATIVE ng/mL (ref <50)
Oxidant: NEGATIVE ug/mL (ref <200)
Oxycodone: NEGATIVE ng/mL (ref <100)
Temazepam: NEGATIVE ng/mL (ref <50)
pH: 6.5 (ref 4.5–9.0)

## 2023-05-05 LAB — DM TEMPLATE

## 2023-05-05 NOTE — Telephone Encounter (Signed)
 Patient would like to change where she is having her mammogram done at she would like to have it done at the  [facility] because its closer and she is more familiar with the ppl there she has to have another mammogram order done with that office on it in order to have the mammogram done and she would like a call back when this has been done

## 2023-05-06 ENCOUNTER — Telehealth: Payer: Self-pay

## 2023-05-06 NOTE — Telephone Encounter (Signed)
 Copied from CRM [id number]. Topic: Clinical - Lab/Test Results >> May 06, 2023  1:18 PM Kita Perish H wrote: Reason for CRM: Patient is calling for an explanation of labs done on 2/04, please reach out to patient, thanks.  Timberlyn [phone]

## 2023-05-07 ENCOUNTER — Encounter: Payer: Self-pay | Admitting: Family

## 2023-05-09 ENCOUNTER — Other Ambulatory Visit: Payer: Self-pay | Admitting: Family

## 2023-05-09 ENCOUNTER — Encounter: Payer: Self-pay | Admitting: Family

## 2023-05-09 DIAGNOSIS — Z1231 Encounter for screening mammogram for malignant neoplasm of breast: Secondary | ICD-10-CM

## 2023-05-09 MED ORDER — AZITHROMYCIN 250 MG PO TABS: ORAL_TABLET | ORAL | 0 refills | Status: AC

## 2023-05-09 NOTE — Telephone Encounter (Signed)
 Patient received message about normal results

## 2023-05-17 ENCOUNTER — Encounter: Payer: Self-pay | Admitting: Family

## 2023-05-17 ENCOUNTER — Inpatient Hospital Stay (HOSPITAL_BASED_OUTPATIENT_CLINIC_OR_DEPARTMENT_OTHER): Admission: RE | Admit: 2023-05-17 | Payer: Medicare Other | Source: Ambulatory Visit

## 2023-05-17 DIAGNOSIS — G4733 Obstructive sleep apnea (adult) (pediatric): Secondary | ICD-10-CM

## 2023-05-18 ENCOUNTER — Ambulatory Visit: Payer: Medicare Other | Admitting: Pulmonary Disease

## 2023-05-18 MED ORDER — ZEPBOUND 2.5 MG/0.5ML ~~LOC~~ SOAJ: 2.5000 mg | SUBCUTANEOUS | 0 refills | Status: DC

## 2023-05-18 NOTE — Addendum Note (Signed)
Addended by: Sandford Craze on: 05/18/2023 05:41 PM   Modules accepted: Orders

## 2023-05-20 ENCOUNTER — Encounter (INDEPENDENT_AMBULATORY_CARE_PROVIDER_SITE_OTHER): Payer: Self-pay | Admitting: Family

## 2023-05-20 DIAGNOSIS — J019 Acute sinusitis, unspecified: Secondary | ICD-10-CM | POA: Diagnosis not present

## 2023-05-20 MED ORDER — AMOXICILLIN-POT CLAVULANATE 875-125 MG PO TABS: 1.0000 | ORAL_TABLET | Freq: Two times a day (BID) | ORAL | 0 refills | Status: AC

## 2023-05-20 NOTE — Telephone Encounter (Signed)

## 2023-05-23 DIAGNOSIS — L219 Seborrheic dermatitis, unspecified: Secondary | ICD-10-CM | POA: Diagnosis not present

## 2023-05-23 NOTE — Telephone Encounter (Signed)
 Patient confirmed she did receive message and she is taking the antibiotics. She is starting to feel better but still significant congestion. Patient was advised if not better after completing treatment, she needs ov.

## 2023-05-25 ENCOUNTER — Encounter: Payer: Medicare Other | Admitting: Family

## 2023-05-25 ENCOUNTER — Other Ambulatory Visit: Payer: Self-pay | Admitting: Family

## 2023-05-25 DIAGNOSIS — G4733 Obstructive sleep apnea (adult) (pediatric): Secondary | ICD-10-CM

## 2023-05-25 NOTE — Telephone Encounter (Signed)
 Can you please forward to the PA Team?

## 2023-05-26 ENCOUNTER — Ambulatory Visit (HOSPITAL_BASED_OUTPATIENT_CLINIC_OR_DEPARTMENT_OTHER)
Admission: RE | Admit: 2023-05-26 | Discharge: 2023-05-26 | Disposition: A | Discharge status: Home / Self Care | Payer: Medicare Other | Source: Ambulatory Visit | Attending: Family | Admitting: Family

## 2023-05-26 DIAGNOSIS — Z1231 Encounter for screening mammogram for malignant neoplasm of breast: Secondary | ICD-10-CM | POA: Diagnosis not present

## 2023-06-01 ENCOUNTER — Telehealth: Payer: Self-pay | Admitting: *Deleted

## 2023-06-01 ENCOUNTER — Other Ambulatory Visit: Payer: Self-pay | Admitting: Family

## 2023-06-01 NOTE — Telephone Encounter (Signed)
 Prior auth started via cover my meds.  Awaiting determination  Key: [id number]

## 2023-06-02 NOTE — Telephone Encounter (Signed)
 PA team already working on this

## 2023-06-02 NOTE — Telephone Encounter (Signed)
 Requesting: Lyrica 75mg   Contract: 05/03/23 UDS: 05/03/23 Last Visit: 05/03/23 Next Visit:  08/02/23 Last Refill: 05/03/23 #60 and 0RF   Please Advise

## 2023-06-06 NOTE — Telephone Encounter (Signed)
 Outcome Approved on March 6 by [facility] Medicare 2017 NCPDP Request Reference Number: [id number]. ZEPBOUND INJ 2.5/0.5 is approved through 03/28/2024. Your patient may now fill this prescription and it will be covered. Effective Date: 06/02/2023 Authorization Expiration Date: 03/28/2024

## 2023-06-13 MED ORDER — GABAPENTIN 300 MG PO CAPS: 300.0000 mg | ORAL_CAPSULE | Freq: Three times a day (TID) | ORAL | 2 refills | Status: DC

## 2023-06-13 MED ORDER — GABAPENTIN 100 MG PO CAPS: 100.0000 mg | ORAL_CAPSULE | Freq: Three times a day (TID) | ORAL | 3 refills | Status: DC

## 2023-06-13 NOTE — Addendum Note (Signed)
 Addended by: Sandford Craze on: 06/13/2023 12:17 PM   Modules accepted: Orders

## 2023-06-21 ENCOUNTER — Encounter: Payer: Self-pay | Admitting: Family

## 2023-06-21 ENCOUNTER — Other Ambulatory Visit: Payer: Self-pay | Admitting: Family

## 2023-06-21 DIAGNOSIS — G039 Meningitis, unspecified: Secondary | ICD-10-CM

## 2023-06-21 DIAGNOSIS — G4733 Obstructive sleep apnea (adult) (pediatric): Secondary | ICD-10-CM

## 2023-06-23 MED ORDER — AMLODIPINE BESYLATE 5 MG PO TABS: 5.0000 mg | ORAL_TABLET | Freq: Every day | ORAL | 1 refills | Status: DC

## 2023-06-23 NOTE — Addendum Note (Signed)
 Addended by: Sandford Craze on: 06/23/2023 04:24 PM   Modules accepted: Orders

## 2023-06-27 MED ORDER — ZEPBOUND 5 MG/0.5ML ~~LOC~~ SOAJ: 5.0000 mg | SUBCUTANEOUS | 2 refills | Status: DC

## 2023-06-27 NOTE — Addendum Note (Signed)
 Addended by: Sandford Craze on: 06/27/2023 08:36 AM   Modules accepted: Orders

## 2023-06-29 NOTE — Addendum Note (Signed)
 Addended by: Sandford Craze on: 06/29/2023 03:51 PM   Modules accepted: Orders

## 2023-06-30 ENCOUNTER — Encounter: Payer: Self-pay | Admitting: Family

## 2023-07-01 ENCOUNTER — Telehealth: Payer: Self-pay

## 2023-07-01 ENCOUNTER — Ambulatory Visit: Admitting: Family

## 2023-07-01 NOTE — Telephone Encounter (Signed)
 Initial Comment Caller states she has a 840 AM appt and she cant make it due to her daughter being in the ER. Patient request to speak to RN No Additional Comment Please call pt back to reschedule. Hours provided. Translation No Disp. Time Lamount Cohen Time) Disposition Final User 07/01/2023 7:07:56 AM General Information Provided Yes Gean Maidens Final Disposition 07/01/2023 7:07:56 AM General Information Provided Yes Gean Maidens

## 2023-07-01 NOTE — Telephone Encounter (Signed)
 I didn't cancel appt for today because it looks like her appt is actually at 10:40am this morning.

## 2023-07-02 NOTE — Telephone Encounter (Signed)
 Please contact pt to reschedule this appointment.

## 2023-07-04 NOTE — Telephone Encounter (Signed)
 Appointment scheduled for tomorrow.

## 2023-07-05 ENCOUNTER — Ambulatory Visit (INDEPENDENT_AMBULATORY_CARE_PROVIDER_SITE_OTHER): Admitting: Family

## 2023-07-05 VITALS — BP 139/70 | HR 61 | Temp 97.8°F | Resp 16 | Ht 61.0 in | Wt 164.0 lb

## 2023-07-05 DIAGNOSIS — R21 Rash and other nonspecific skin eruption: Secondary | ICD-10-CM

## 2023-07-05 DIAGNOSIS — F32A Depression, unspecified: Secondary | ICD-10-CM | POA: Diagnosis not present

## 2023-07-05 DIAGNOSIS — F419 Anxiety disorder, unspecified: Secondary | ICD-10-CM

## 2023-07-05 DIAGNOSIS — Z79899 Other long term (current) drug therapy: Secondary | ICD-10-CM | POA: Diagnosis not present

## 2023-07-05 DIAGNOSIS — G039 Meningitis, unspecified: Secondary | ICD-10-CM | POA: Diagnosis not present

## 2023-07-05 MED ORDER — CLONAZEPAM 1 MG PO TABS: 0.5000 mg | ORAL_TABLET | Freq: Two times a day (BID) | ORAL | Status: AC | PRN

## 2023-07-05 MED ORDER — TRAMADOL HCL 50 MG PO TABS: 50.0000 mg | ORAL_TABLET | Freq: Two times a day (BID) | ORAL | 0 refills | Status: DC | PRN

## 2023-07-05 NOTE — Assessment & Plan Note (Signed)
 New- intermittent. Mostly resolved today.   - Use hydrocortisone cream for itch relief. - Consider Claritin for itch relief. - Avoid Benadryl due to sedative effects. - Switch to free and clear detergents and unscented dryer sheets. - Monitor for worsening or more frequent rashes.

## 2023-07-05 NOTE — Assessment & Plan Note (Addendum)
 Chronic arachnoiditis with severe pain limiting mobility. Gabapentin provides partial relief. Lyrica ineffective and she has already discontinued Lyrica. Discussed risks of narcotics and clonazepam combination.   She is aware and is agreeable to cut her clonazepam dose from 1mg  bid to 0.5mg  bid and notify her psychiatrist of this recommendation.  - Will prescribe tramadol 50mg  BID. Advised OK to take with tylenol 650mg .  Reviewed medical record and she has tolerated tramadol in the past.   -D/C trazadone. - Monitor for increased sedation and falls. - Continue gabapentin 400 mg TID. -controlled substance contract is signed today. - obtain UDS.  -follow up in 1 month for re-evaluation.

## 2023-07-05 NOTE — Assessment & Plan Note (Signed)
 Followed by Dr. Evelene Croon.  Decrease klonopin with addition of tramadol, d/c trazodone to decrease sedation and risk of serotonin syndrome.

## 2023-07-05 NOTE — Patient Instructions (Signed)
 VISIT SUMMARY:  During today's visit, we discussed your ongoing pain management for chronic arachnoiditis, as well as your recent issues with itchy rashes and headaches. We reviewed your current medications and made some adjustments to better manage your symptoms. We also discussed your chronic allergic rhinitis and how to improve your symptom control.  YOUR PLAN:  -ARACHNOIDITIS: Arachnoiditis is a painful condition caused by inflammation of the arachnoid, one of the membranes that surround and protect the nerves of the spinal cord. We will start you on tramadol for pain relief and reduce your clonazepam dosage by half to 0.5mg  twice daily as needed. minimize sedation risks. Continue taking gabapentin 400 mg three times a day. Stop trazodone. Please monitor for increased sedation and falls.  -PRURITUS AND RASH: Pruritus refers to severe itching, and your rashes may be linked to your gabapentin use. Continue using hydrocortisone cream for itch relief and consider taking Claritin. Avoid using Benadryl due to its sedative effects. Switch to free and clear detergents and unscented dryer sheets. Monitor for any worsening or more frequent rashes.  -ALLERGIC RHINITIS: Allergic rhinitis is inflammation of the inside of your nose caused by an allergen, such as pollen, dust, mold, or flakes of skin from certain animals. Continue taking Singulair and use Claritin for symptom relief. If Claritin is not sufficient, consider adding Flonase.  INSTRUCTIONS:  Please schedule a follow-up appointment in three months to monitor your new medication regimen, especially the adjustments to tramadol and clonazepam.

## 2023-07-05 NOTE — Progress Notes (Signed)
 Subjective:     Patient ID: [name], female    DOB: 1950/09/21, 73 y.o.   MRN: [id number]  Chief Complaint  Patient presents with   Back Pain    Here for pain management    HPI  Discussed the use of AI scribe software for clinical note transcription with the patient, who gave verbal consent to proceed.  History of Present Illness  [name] is a 73 year old female with chronic arachnoiditis who presents for pain management. She is accompanied by her husband, Rocky Link.  She has been experiencing chronic pain due to arachnoiditis for the past fourteen years. Lyrica was not effective, leading to its discontinuation. She is currently taking gabapentin, 400 mg three times a day, which helps alleviate the severe, crying pain but does not completely resolve it. Her pain is rated as a four out of ten when sitting and not moving, and it worsens with movement. She uses a walker due to balance issues and fear of falling when experiencing pain. She has not been out of the house for a month due to the severity of her symptoms. She has been using heating pads, a heated recliner, and vibration to manage her pain. She has also tried to make her sofa more comfortable by adding various supports. She has a history of using fentanyl patches up to 100 mg, which she discontinued due to severe side effects, including being in a 'zombie-like' state.  She experiences itchy rashes, which she suspects may be related to her gabapentin use, although she has been on it for a long time. She has been using hydrocortisone cream to manage the itching and has noticed red patches that turn black and blue from scratching. She has also experienced a breakout of bumps on her chest.  She has been experiencing headaches, which she describes as unusual and not related to her allergies. She typically uses ibuprofen or Excedrin Migraine for relief. She also takes Claritin and Singulair for her allergies, although nasal sprays are  ineffective due to a deviated septum.     Health Maintenance Due  Topic Date Due   DTaP/Tdap/Td (2 - Tdap) 06/07/2018   COVID-19 Vaccine (7 - Pfizer risk 2024-25 season) 05/24/2023    Past Medical History:  Diagnosis Date   AKI (acute kidney injury) (HCC) 01/09/2020   Allergy    seasonal   Anxiety    Arachnoiditis    Cataract    Depression    Elevated liver enzymes    Environmental allergies    GERD (gastroesophageal reflux disease)    H/O being hospitalized 03/2012   x1 week for nausea   History of chicken pox    Hyperlipidemia    Hypertension    Migraine with typical aura    resolved years ago   Nausea    chronic nausea    Neuromuscular disorder (HCC)    Pneumonia 03/02/2022    Past Surgical History:  Procedure Laterality Date   ABDOMINOPLASTY  07/11/09   EUS N/A 05/05/2012   Procedure: UPPER ENDOSCOPIC ULTRASOUND (EUS) LINEAR;  Surgeon: Theda Belfast, MD;  Location: [facility];  Service: Endoscopy;  Laterality: N/A;   LAPAROSCOPIC CHOLECYSTECTOMY  2010   NASAL SINUS SURGERY     "Has had 2-3 surgeries over the years"   UMBILICAL HERNIA REPAIR N/A 10/06/2020   Procedure: UMBILICAL HERNIA REPAIR;  Surgeon: Abigail Miyamoto, MD;  Location: [facility];  Service: General;  Laterality: N/A;  LMA ANESTHESIA  Family History  Problem Relation Age of Onset   Alzheimer's disease Mother    Hypertension Mother    Migraines Mother    Hypertension Father    Other Father        ?carotid artery aneurysm   Cancer Brother        mouth and throat   Colon cancer Neg Hx     Social History   Socioeconomic History   Marital status: Married    Spouse name: Not on file   Number of children: Not on file   Years of education: Not on file   Highest education level: Some college, no degree  Occupational History   Not on file  Tobacco Use   Smoking status: Former    Current packs/day: 0.00    Average packs/day: 0.3 packs/day for 8.0 years (2.0 ttl  pk-yrs)    Types: Cigarettes    Start date: 08/09/1978    Quit date: 08/09/1986    Years since quitting: 36.9   Smokeless tobacco: Never  Vaping Use   Vaping status: Never Used  Substance and Sexual Activity   Alcohol use: No   Drug use: No   Sexual activity: Not Currently    Partners: Male    Birth control/protection: Other-see comments, Post-menopausal    Comment: husband had vasectomy.   Other Topics Concern   Not on file  Social History Narrative   2 step daughters- 83 and 75   She worked as an Environmental health practitioner.   Enjoys puzzles, counted cross stitch.   Completed 1 year of college   4 cats   Social Drivers of Health   Financial Resource Strain: Low Risk  (05/03/2023)   Overall Financial Resource Strain (CARDIA)    Difficulty of Paying Living Expenses: Not hard at all  Food Insecurity: No Food Insecurity (05/03/2023)   Hunger Vital Sign    Worried About Running Out of Food in the Last Year: Never true    Ran Out of Food in the Last Year: Never true  Transportation Needs: No Transportation Needs (05/03/2023)   PRAPARE - Administrator, Civil Service (Medical): No    Lack of Transportation (Non-Medical): No  Physical Activity: Inactive (05/03/2023)   Exercise Vital Sign    Days of Exercise per Week: 0 days    Minutes of Exercise per Session: 0 min  Stress: Stress Concern Present (05/03/2023)   [name] of Occupational Health - Occupational Stress Questionnaire    Feeling of Stress : Rather much  Social Connections: Unknown (05/03/2023)   Social Connection and Isolation Panel [NHANES]    Frequency of Communication with Friends and Family: Twice a week    Frequency of Social Gatherings with Friends and Family: Patient declined    Attends Religious Services: Never    Database administrator or Organizations: No    Attends Engineer, structural: Never    Marital Status: Married  Recent Concern: Social Connections - Moderately Isolated (04/26/2023)    Social Connection and Isolation Panel [NHANES]    Frequency of Communication with Friends and Family: More than three times a week    Frequency of Social Gatherings with Friends and Family: More than three times a week    Attends Religious Services: Never    Database administrator or Organizations: No    Attends Banker Meetings: Never    Marital Status: Married  Catering manager Violence: Not At Risk (04/26/2023)   Humiliation, Afraid, Rape, and  Kick questionnaire    Fear of Current or Ex-Partner: No    Emotionally Abused: No    Physically Abused: No    Sexually Abused: No    Outpatient Medications Prior to Visit  Medication Sig Dispense Refill   amLODipine (NORVASC) 5 MG tablet Take 1 tablet (5 mg total) by mouth daily. 90 tablet 1   escitalopram (LEXAPRO) 10 MG tablet Take 1 tablet (10 mg total) by mouth daily.     gabapentin (NEURONTIN) 100 MG capsule Take 1 capsule (100 mg total) by mouth 3 (three) times daily. Take with 300 mg cap TID 90 capsule 3   gabapentin (NEURONTIN) 300 MG capsule Take 1 capsule (300 mg total) by mouth 3 (three) times daily. Take with 100mg  cap TID 90 capsule 2   mirtazapine (REMERON) 45 MG tablet Take 45 mg by mouth at bedtime.      Misc Natural Products (NEURIVA PO) Take by mouth.     montelukast (SINGULAIR) 10 MG tablet Take 1 tablet (10 mg total) by mouth at bedtime. 90 tablet 0   Multiple Vitamin (MULTIVITAMIN) capsule Take 1 capsule by mouth daily. Dynamic brain     tirzepatide (ZEPBOUND) 5 MG/0.5ML Pen Inject 5 mg into the skin once a week. 2 mL 2   clonazePAM (KLONOPIN) 1 MG tablet TAKE 1 TABLET BY MOUTH THREE TIMES A DAY AS NEEDED FOR ANXIETY 90 tablet 0   traZODone (DESYREL) 50 MG tablet Take 50-150 mg by mouth at bedtime as needed for sleep.      cetirizine (ZYRTEC) 10 MG tablet Take 10 mg by mouth daily.     famotidine (PEPCID) 20 MG tablet Take 1 tablet (20 mg total) by mouth at bedtime.     Facility-Administered Medications  Prior to Visit  Medication Dose Route Frequency Provider Last Rate Last Admin   albuterol (PROVENTIL) (2.5 MG/3ML) 0.083% nebulizer solution 2.5 mg  2.5 mg Nebulization Once         Allergies  Allergen Reactions   Compazine [Prochlorperazine]     hyperactivity   Morphine And Codeine Hives and Itching   Trintellix [Vortioxetine] Nausea And Vomiting    ROS See HPI    Objective:    Physical Exam Constitutional:      General: She is not in acute distress.    Appearance: Normal appearance. She is well-developed.  HENT:     Head: Normocephalic and atraumatic.     Right Ear: External ear normal.     Left Ear: External ear normal.  Eyes:     General: No scleral icterus. Neck:     Thyroid: No thyromegaly.  Cardiovascular:     Rate and Rhythm: Normal rate and regular rhythm.     Heart sounds: Normal heart sounds. No murmur heard. Pulmonary:     Effort: Pulmonary effort is normal. No respiratory distress.     Breath sounds: Normal breath sounds. No wheezing.  Musculoskeletal:        General: No swelling.     Cervical back: Neck supple.  Skin:    General: Skin is warm and dry.  Neurological:     Mental Status: She is alert and oriented to person, place, and time.  Psychiatric:        Mood and Affect: Mood normal.        Behavior: Behavior normal.        Thought Content: Thought content normal.        Judgment: Judgment normal.      BP  139/70 (BP Location: Right Arm, Patient Position: Sitting, Cuff Size: Normal)   Pulse 61   Temp 97.8 F (36.6 C) (Oral)   Resp 16   Ht 5\' 1"  (1.549 m)   Wt 164 lb (74.4 kg)   LMP 03/29/1993 Comment: 12/25/13 Pt states last cycle has been about 20 yrs ago.  SpO2 94%   BMI 30.99 kg/m  Wt Readings from Last 3 Encounters:  07/05/23 164 lb (74.4 kg)  05/03/23 170 lb (77.1 kg)  04/26/23 166 lb (75.3 kg)       Assessment & Plan:   Problem List Items Addressed This Visit       Unprioritized   Skin rash   New- intermittent. Mostly  resolved today.   - Use hydrocortisone cream for itch relief. - Consider Claritin for itch relief. - Avoid Benadryl due to sedative effects. - Switch to free and clear detergents and unscented dryer sheets. - Monitor for worsening or more frequent rashes.      Arachnoiditis - Primary   Chronic arachnoiditis with severe pain limiting mobility. Gabapentin provides partial relief. Lyrica ineffective and she has already discontinued Lyrica. Discussed risks of narcotics and clonazepam combination.   She is aware and is agreeable to cut her clonazepam dose from 1mg  bid to 0.5mg  bid and notify her psychiatrist of this recommendation.  - Will prescribe tramadol 50mg  BID. Advised OK to take with tylenol 650mg .  Reviewed medical record and she has tolerated tramadol in the past.   -D/C trazadone. - Monitor for increased sedation and falls. - Continue gabapentin 400 mg TID. -controlled substance contract is signed today. - obtain UDS.  -follow up in 1 month for re-evaluation.       Relevant Medications   traMADol (ULTRAM) 50 MG tablet   Other Relevant Orders   DRUG MONITORING, PANEL 8 WITH CONFIRMATION, URINE   Anxiety and depression   Followed by Dr. Evelene Croon.  Decrease klonopin with addition of tramadol, d/c trazodone to decrease sedation and risk of serotonin syndrome.        I have discontinued Adaleen Hulgan traZODone, cetirizine, and famotidine. I have also changed her clonazePAM. Additionally, I am having her start on traMADol. Lastly, I am having her maintain her mirtazapine, multivitamin, escitalopram, Misc Natural Products (NEURIVA PO), montelukast, gabapentin, gabapentin, amLODipine, and Zepbound. We will continue to administer albuterol.  Meds ordered this encounter  Medications   traMADol (ULTRAM) 50 MG tablet    Sig: Take 1 tablet (50 mg total) by mouth every 12 (twelve) hours as needed.    Dispense:  60 tablet    Refill:  0    Supervising Provider:   Danise Edge A [[id number]]    clonazePAM (KLONOPIN) 1 MG tablet    Sig: Take 0.5 tablets (0.5 mg total) by mouth 2 (two) times daily as needed for anxiety. TAKE 1 TABLET BY MOUTH THREE TIMES A DAY AS NEEDED FOR ANXIETY    Supervising MD:  Danise Edge   DEA: [id number]    Supervising Provider:   Danise Edge A [phone]

## 2023-07-06 ENCOUNTER — Encounter: Payer: Self-pay | Admitting: Family

## 2023-07-07 LAB — DRUG MONITORING, PANEL 8 WITH CONFIRMATION, URINE
6 Acetylmorphine: NEGATIVE ng/mL (ref <10)
Alcohol Metabolites: NEGATIVE ng/mL (ref <500)
Alphahydroxyalprazolam: NEGATIVE ng/mL (ref <25)
Alphahydroxymidazolam: NEGATIVE ng/mL (ref <50)
Alphahydroxytriazolam: NEGATIVE ng/mL (ref <50)
Aminoclonazepam: 813 ng/mL — ABNORMAL HIGH (ref <25)
Amphetamines: NEGATIVE ng/mL (ref <500)
Benzodiazepines: POSITIVE ng/mL — AB (ref <100)
Buprenorphine, Urine: NEGATIVE ng/mL (ref <5)
Cocaine Metabolite: NEGATIVE ng/mL (ref <150)
Creatinine: 135.3 mg/dL (ref >=20.0)
Hydroxyethylflurazepam: NEGATIVE ng/mL (ref <50)
Lorazepam: NEGATIVE ng/mL (ref <50)
MDMA: NEGATIVE ng/mL (ref <500)
Marijuana Metabolite: NEGATIVE ng/mL (ref <20)
Nordiazepam: NEGATIVE ng/mL (ref <50)
Opiates: NEGATIVE ng/mL (ref <100)
Oxazepam: NEGATIVE ng/mL (ref <50)
Oxidant: NEGATIVE ug/mL (ref <200)
Oxycodone: NEGATIVE ng/mL (ref <100)
Temazepam: NEGATIVE ng/mL (ref <50)
pH: 6.5 (ref 4.5–9.0)

## 2023-07-07 LAB — DM TEMPLATE

## 2023-07-14 ENCOUNTER — Other Ambulatory Visit: Payer: Self-pay | Admitting: Family

## 2023-07-14 DIAGNOSIS — J309 Allergic rhinitis, unspecified: Secondary | ICD-10-CM

## 2023-07-26 DIAGNOSIS — H26493 Other secondary cataract, bilateral: Secondary | ICD-10-CM | POA: Diagnosis not present

## 2023-07-26 DIAGNOSIS — Z961 Presence of intraocular lens: Secondary | ICD-10-CM | POA: Diagnosis not present

## 2023-07-26 DIAGNOSIS — H524 Presbyopia: Secondary | ICD-10-CM | POA: Diagnosis not present

## 2023-07-31 ENCOUNTER — Other Ambulatory Visit: Payer: Self-pay | Admitting: Family

## 2023-07-31 DIAGNOSIS — G039 Meningitis, unspecified: Secondary | ICD-10-CM

## 2023-08-01 NOTE — Telephone Encounter (Signed)
 Rx sent.

## 2023-08-02 ENCOUNTER — Ambulatory Visit: Payer: Self-pay | Admitting: *Deleted

## 2023-08-02 ENCOUNTER — Telehealth (INDEPENDENT_AMBULATORY_CARE_PROVIDER_SITE_OTHER): Admitting: Family

## 2023-08-02 ENCOUNTER — Ambulatory Visit: Payer: Medicare Other | Admitting: Family

## 2023-08-02 VITALS — Ht 61.0 in | Wt 159.0 lb

## 2023-08-02 DIAGNOSIS — J309 Allergic rhinitis, unspecified: Secondary | ICD-10-CM

## 2023-08-02 DIAGNOSIS — E66811 Obesity, class 1: Secondary | ICD-10-CM | POA: Diagnosis not present

## 2023-08-02 NOTE — Telephone Encounter (Signed)
  Chief Complaint: sinus headache- severe sinus, facial pain Symptoms: headache, facial pain, congestion Frequency: started yesterday- woke with it today Pertinent Negatives: Patient denies fever Disposition: [] ED /[] Urgent Care (no appt availability in office) / [x] Appointment(In office/virtual)/ []  [facility]/ [] Home Care/ [] Refused Recommended Disposition /[] [facility]/ []  Follow-up with PCP   Copied from CRM [id number]. Topic: Clinical - Red Word Triage >> Aug 02, 2023  9:11 AM [place] A wrote: [facility] that prompted transfer to Nurse Triage: Patient is having a headache due to severe allegies. Patient said she went to sleep lastnight with headache and woke up with headache. Reason for Disposition  [1] SEVERE headache (e.g., excruciating) AND [2] not improved after 2 hours of pain medicine  Answer Assessment - Initial Assessment Questions 1. LOCATION: "Where does it hurt?"      Face pain 2. ONSET: "When did the headache start?" (Minutes, hours or days)      Started last night- and woke with it today 3. PATTERN: "Does the pain come and go, or has it been constant since it started?"     constant 4. SEVERITY: "How bad is the pain?" and "What does it keep you from doing?"  (e.g., Scale 1-10; mild, moderate, or severe)   - MILD (1-3): doesn't interfere with normal activities    - MODERATE (4-7): interferes with normal activities or awakens from sleep    - SEVERE (8-10): excruciating pain, unable to do any normal activities        8/10 5. RECURRENT SYMPTOM: "Have you ever had headaches before?" If Yes, ask: "When was the last time?" and "What happened that time?"      Yes- 2 days ago 6. CAUSE: "What do you think is causing the headache?"     Sinus headache 7. MIGRAINE: "Have you been diagnosed with migraine headaches?" If Yes, ask: "Is this headache similar?"      na 8. HEAD INJURY: "Has there been any recent injury to the head?"      na 9. OTHER SYMPTOMS: "Do  you have any other symptoms?" (fever, stiff neck, eye pain, sore throat, cold symptoms)     Cough, sinus drainage  Protocols used: Headache-A-AH

## 2023-08-02 NOTE — Progress Notes (Signed)
 MyChart Video Visit   Wt Readings from Last 3 Encounters:  08/02/23 159 lb (72.1 kg)  07/05/23 164 lb (74.4 kg)  05/03/23 170 lb (77.1 kg)    Virtual Visit via Video Note    Patient location: Home. Patient and provider in visit Provider location: Office  I discussed the limitations of evaluation and management by telemedicine and the availability of in person appointments. The patient expressed understanding and agreed to proceed.  Visit Date: 08/02/2023  Today's healthcare provider: Rochele Christmas, NP     Subjective:    Patient ID: [name], female    DOB: [birth year], 73 y.o.   MRN: [id number]  Chief Complaint  Patient presents with   Facial Pain    Complains of sinus pressure and pain   Nasal Congestion    Complains of nasal congestion with post nasal drip   Obesity    On Zepbound      HPI [name] is a 73 year old female who presents with allergy symptoms and sinus headaches.  She experiences pressure in her temples and postnasal drip, particularly at night, leading to coughing and occasional vomiting of phlegm. Symptoms worsen at night due to congestion and postnasal drip. She takes cough medicine and Zyrtec , which alleviates her symptoms. She has not consulted pulmonary specialists as her cough is inconsistent and related to congestion.  She has been on Zepbound  since February without issues of nausea or constipation. She has lost 11 pounds since starting Zepbound , improving the fit of her clothes and reducing pressure on her back and tailbone. Her current weight is 159 pounds.  She uses a walker for mobility and exercises by walking around her house. She monitors her blood pressure at home, believing it to be within the normal range, possibly slightly elevated. She stays indoors due to allergies but plans to go outside as pollen levels decrease.  Past Medical History:  Diagnosis Date   AKI (acute kidney injury) (HCC) 01/09/2020   Allergy     seasonal   Anxiety    Arachnoiditis    Cataract    Depression    Elevated liver enzymes    Environmental allergies    GERD (gastroesophageal reflux disease)    H/O being hospitalized 03/2012   x1 week for nausea   History of chicken pox    Hyperlipidemia    Hypertension    Migraine with typical aura    resolved years ago   Nausea    chronic nausea    Neuromuscular disorder (HCC)    Pneumonia 03/02/2022    Past Surgical History:  Procedure Laterality Date   ABDOMINOPLASTY  07/11/09   EUS N/A 05/05/2012   Procedure: UPPER ENDOSCOPIC ULTRASOUND (EUS) LINEAR;  Surgeon: Almeda Aris, MD;  Location: [facility];  Service: Endoscopy;  Laterality: N/A;   LAPAROSCOPIC CHOLECYSTECTOMY  2010   NASAL SINUS SURGERY     "Has had 2-3 surgeries over the years"   UMBILICAL HERNIA REPAIR N/A 10/06/2020   Procedure: UMBILICAL HERNIA REPAIR;  Surgeon: Oza Blumenthal, MD;  Location: [facility];  Service: General;  Laterality: N/A;  LMA ANESTHESIA    Family History  Problem Relation Age of Onset   Alzheimer's disease Mother    Hypertension Mother    Migraines Mother    Hypertension Father    Other Father        ?carotid artery aneurysm   Cancer Brother        mouth and throat  Colon cancer Neg Hx     Social History   Socioeconomic History   Marital status: Married    Spouse name: Not on file   Number of children: Not on file   Years of education: Not on file   Highest education level: Some college, no degree  Occupational History   Not on file  Tobacco Use   Smoking status: Former    Current packs/day: 0.00    Average packs/day: 0.3 packs/day for 8.0 years (2.0 ttl pk-yrs)    Types: Cigarettes    Start date: 08/09/1978    Quit date: 08/09/1986    Years since quitting: 37.0   Smokeless tobacco: Never  Vaping Use   Vaping status: Never Used  Substance and Sexual Activity   Alcohol use: No   Drug use: No   Sexual activity: Not Currently    Partners: Male     Birth control/protection: Other-see comments, Post-menopausal    Comment: husband had vasectomy.   Other Topics Concern   Not on file  Social History Narrative   2 step daughters- 35 and 53   She worked as an Environmental health practitioner.   Enjoys puzzles, counted cross stitch.   Completed 1 year of college   4 cats   Social Drivers of Health   Financial Resource Strain: Low Risk  (05/03/2023)   Overall Financial Resource Strain (CARDIA)    Difficulty of Paying Living Expenses: Not hard at all  Food Insecurity: No Food Insecurity (05/03/2023)   Hunger Vital Sign    Worried About Running Out of Food in the Last Year: Never true    Ran Out of Food in the Last Year: Never true  Transportation Needs: No Transportation Needs (05/03/2023)   PRAPARE - Administrator, Civil Service (Medical): No    Lack of Transportation (Non-Medical): No  Physical Activity: Inactive (05/03/2023)   Exercise Vital Sign    Days of Exercise per Week: 0 days    Minutes of Exercise per Session: 0 min  Stress: Stress Concern Present (05/03/2023)   [name] of Occupational Health - Occupational Stress Questionnaire    Feeling of Stress : Rather much  Social Connections: Unknown (05/03/2023)   Social Connection and Isolation Panel [NHANES]    Frequency of Communication with Friends and Family: Twice a week    Frequency of Social Gatherings with Friends and Family: Patient declined    Attends Religious Services: Never    Database administrator or Organizations: No    Attends Engineer, structural: Never    Marital Status: Married  Recent Concern: Social Connections - Moderately Isolated (04/26/2023)   Social Connection and Isolation Panel [NHANES]    Frequency of Communication with Friends and Family: More than three times a week    Frequency of Social Gatherings with Friends and Family: More than three times a week    Attends Religious Services: Never    Database administrator or  Organizations: No    Attends Banker Meetings: Never    Marital Status: Married  Catering manager Violence: Not At Risk (04/26/2023)   Humiliation, Afraid, Rape, and Kick questionnaire    Fear of Current or Ex-Partner: No    Emotionally Abused: No    Physically Abused: No    Sexually Abused: No    Outpatient Medications Prior to Visit  Medication Sig Dispense Refill   amLODipine  (NORVASC ) 5 MG tablet Take 1 tablet (5 mg total) by mouth daily. 90  tablet 1   clonazePAM  (KLONOPIN ) 1 MG tablet Take 0.5 tablets (0.5 mg total) by mouth 2 (two) times daily as needed for anxiety. TAKE 1 TABLET BY MOUTH THREE TIMES A DAY AS NEEDED FOR ANXIETY     escitalopram  (LEXAPRO ) 10 MG tablet Take 1 tablet (10 mg total) by mouth daily.     gabapentin  (NEURONTIN ) 100 MG capsule Take 1 capsule (100 mg total) by mouth 3 (three) times daily. Take with 300 mg cap TID 90 capsule 3   gabapentin  (NEURONTIN ) 300 MG capsule Take 1 capsule (300 mg total) by mouth 3 (three) times daily. Take with 100mg  cap TID 90 capsule 2   mirtazapine  (REMERON ) 45 MG tablet Take 45 mg by mouth at bedtime.      Misc Natural Products (NEURIVA PO) Take by mouth.     montelukast  (SINGULAIR ) 10 MG tablet TAKE 1 TABLET BY MOUTH EVERYDAY AT BEDTIME 90 tablet 0   Multiple Vitamin (MULTIVITAMIN) capsule Take 1 capsule by mouth daily. Dynamic brain     tirzepatide  (ZEPBOUND ) 5 MG/0.5ML Pen Inject 5 mg into the skin once a week. 2 mL 2   traMADol  (ULTRAM ) 50 MG tablet TAKE 1 TABLET BY MOUTH EVERY 12 HOURS AS NEEDED. 60 tablet 0   Facility-Administered Medications Prior to Visit  Medication Dose Route Frequency Provider Last Rate Last Admin   albuterol  (PROVENTIL ) (2.5 MG/3ML) 0.083% nebulizer solution 2.5 mg  2.5 mg Nebulization Once         Allergies  Allergen Reactions   Compazine [Prochlorperazine]     hyperactivity   Morphine And Codeine  Hives and Itching   Trintellix [Vortioxetine] Nausea And Vomiting    ROS See HPI     Objective:    Physical Exam Constitutional:      General: She is not in acute distress.    Appearance: Normal appearance. She is well-developed.  HENT:     Head: Normocephalic and atraumatic.     Right Ear: External ear normal.     Left Ear: External ear normal.  Eyes:     General: No scleral icterus. Neck:     Thyroid : No thyromegaly.  Pulmonary:     Effort: Pulmonary effort is normal.  Musculoskeletal:     Cervical back: Neck supple.  Skin:    General: Skin is warm and dry.  Neurological:     Mental Status: She is alert and oriented to person, place, and time.  Psychiatric:        Mood and Affect: Mood normal.        Behavior: Behavior normal.        Thought Content: Thought content normal.        Judgment: Judgment normal.     Ht 5\' 1"  (1.549 m)   Wt 159 lb (72.1 kg)   LMP 03/29/1993 Comment: 12/25/13 Pt states last cycle has been about 20 yrs ago.  BMI 30.04 kg/m  Wt Readings from Last 3 Encounters:  08/02/23 159 lb (72.1 kg)  07/05/23 164 lb (74.4 kg)  05/03/23 170 lb (77.1 kg)       Assessment & Plan:   Problem List Items Addressed This Visit       Unprioritized   Obesity (BMI 30.0-34.9) - Primary    Obesity management with Zepbound  ongoing, resulting in 11-pound weight loss since February. No adverse effects reported. Improved back and tailbone pressure, able to exercise with walker. - Continue Zepbound  for obesity management. - Schedule face-to-face appointment in early July for tramadol   refill.      Allergic rhinitis    Chronic allergic rhinitis with headaches, temple pressure, postnasal drip, and nocturnal cough. Zyrtec  provides some relief. - Continue Zyrtec  as needed for allergy symptoms. - Consider pulmonary consultation if cough persists.       I am having [name] maintain her mirtazapine , multivitamin, escitalopram , Misc Natural Products (NEURIVA PO), gabapentin , gabapentin , amLODipine , Zepbound , clonazePAM , montelukast , and  traMADol . We will continue to administer albuterol .  No orders of the defined types were placed in this encounter.   I discussed the assessment and treatment plan with the patient. The patient was provided an opportunity to ask questions and all were answered. The patient agreed with the plan and demonstrated an understanding of the instructions.   The patient was advised to call back or seek an in-person evaluation if the symptoms worsen or if the condition fails to improve as anticipated.   Rochele Christmas, NP [facility] [phone] (phone) [phone] (fax)  [facility]

## 2023-08-02 NOTE — Assessment & Plan Note (Signed)
  Chronic allergic rhinitis with headaches, temple pressure, postnasal drip, and nocturnal cough. Zyrtec  provides some relief. - Continue Zyrtec  as needed for allergy symptoms. - Consider pulmonary consultation if cough persists.

## 2023-08-02 NOTE — Patient Instructions (Signed)
 VISIT SUMMARY:  During your visit, we discussed your ongoing allergy symptoms, headaches, and weight management. You have been experiencing pressure in your temples, postnasal drip, and a nocturnal cough, which are somewhat relieved by Zyrtec . You have also successfully lost 11 pounds since starting Zepbound , which has improved your mobility and reduced pressure on your back and tailbone.  YOUR PLAN:  OBESITY: You have been managing your weight with Zepbound  and have lost 11 pounds since February, which has improved your overall comfort and mobility. -Continue taking Zepbound  as prescribed. -Schedule a face-to-face appointment in early July for a tramadol  refill.  ALLERGIC RHINITIS: You have chronic allergic rhinitis causing headaches, temple pressure, postnasal drip, and a nocturnal cough. Zyrtec  provides some relief. -Continue taking Zyrtec  as needed for your allergy symptoms. -Consider seeing a pulmonary specialist if your cough continues.

## 2023-08-02 NOTE — Assessment & Plan Note (Signed)
  Obesity management with Zepbound  ongoing, resulting in 11-pound weight loss since February. No adverse effects reported. Improved back and tailbone pressure, able to exercise with walker. - Continue Zepbound  for obesity management. - Schedule face-to-face appointment in early July for tramadol  refill.

## 2023-08-20 ENCOUNTER — Encounter: Payer: Self-pay | Admitting: Family

## 2023-08-27 ENCOUNTER — Other Ambulatory Visit: Payer: Self-pay | Admitting: Family

## 2023-08-27 DIAGNOSIS — G039 Meningitis, unspecified: Secondary | ICD-10-CM

## 2023-08-27 DIAGNOSIS — J309 Allergic rhinitis, unspecified: Secondary | ICD-10-CM

## 2023-09-13 ENCOUNTER — Other Ambulatory Visit: Payer: Self-pay | Admitting: Family

## 2023-09-20 ENCOUNTER — Other Ambulatory Visit: Payer: Self-pay | Admitting: Family

## 2023-09-26 ENCOUNTER — Other Ambulatory Visit: Payer: Self-pay | Admitting: Family

## 2023-09-26 DIAGNOSIS — G039 Meningitis, unspecified: Secondary | ICD-10-CM

## 2023-09-26 NOTE — Telephone Encounter (Signed)
 Requesting: tramadol  50mg   Contract:  07/05/23 UDS: 07/05/23 Last Visit: 08/02/23 Next Visit: None Last Refill: 08/28/23 #60 and 0RF   Please Advise

## 2023-09-28 ENCOUNTER — Ambulatory Visit: Admitting: Family

## 2023-10-07 ENCOUNTER — Ambulatory Visit: Admitting: Family

## 2023-10-08 ENCOUNTER — Other Ambulatory Visit: Payer: Self-pay | Admitting: Family

## 2023-10-08 DIAGNOSIS — G039 Meningitis, unspecified: Secondary | ICD-10-CM

## 2023-10-09 MED ORDER — GABAPENTIN 300 MG PO CAPS: 300.0000 mg | ORAL_CAPSULE | Freq: Three times a day (TID) | ORAL | 5 refills | Status: AC

## 2023-10-14 ENCOUNTER — Ambulatory Visit (INDEPENDENT_AMBULATORY_CARE_PROVIDER_SITE_OTHER): Admitting: Family

## 2023-10-14 ENCOUNTER — Other Ambulatory Visit: Payer: Self-pay | Admitting: Family

## 2023-10-14 VITALS — BP 133/69 | HR 64 | Temp 98.6°F | Resp 16 | Ht 61.0 in | Wt 156.0 lb

## 2023-10-14 DIAGNOSIS — E663 Overweight: Secondary | ICD-10-CM

## 2023-10-14 DIAGNOSIS — J309 Allergic rhinitis, unspecified: Secondary | ICD-10-CM

## 2023-10-14 DIAGNOSIS — R0683 Snoring: Secondary | ICD-10-CM

## 2023-10-14 DIAGNOSIS — R911 Solitary pulmonary nodule: Secondary | ICD-10-CM

## 2023-10-14 DIAGNOSIS — I10 Essential (primary) hypertension: Secondary | ICD-10-CM | POA: Diagnosis not present

## 2023-10-14 DIAGNOSIS — F419 Anxiety disorder, unspecified: Secondary | ICD-10-CM

## 2023-10-14 DIAGNOSIS — D696 Thrombocytopenia, unspecified: Secondary | ICD-10-CM

## 2023-10-14 DIAGNOSIS — R110 Nausea: Secondary | ICD-10-CM | POA: Diagnosis not present

## 2023-10-14 DIAGNOSIS — G039 Meningitis, unspecified: Secondary | ICD-10-CM | POA: Diagnosis not present

## 2023-10-14 DIAGNOSIS — Z6829 Body mass index (BMI) 29.0-29.9, adult: Secondary | ICD-10-CM

## 2023-10-14 DIAGNOSIS — R918 Other nonspecific abnormal finding of lung field: Secondary | ICD-10-CM | POA: Diagnosis not present

## 2023-10-14 DIAGNOSIS — F32A Depression, unspecified: Secondary | ICD-10-CM

## 2023-10-14 MED ORDER — GABAPENTIN 100 MG PO CAPS: 100.0000 mg | ORAL_CAPSULE | Freq: Three times a day (TID) | ORAL | 1 refills | Status: DC

## 2023-10-14 MED ORDER — TIRZEPATIDE-WEIGHT MANAGEMENT 7.5 MG/0.5ML ~~LOC~~ SOLN: 7.5000 mg | SUBCUTANEOUS | 2 refills | Status: DC

## 2023-10-14 NOTE — Assessment & Plan Note (Signed)
 Stable.  Not currently on zofran . Just uses some otc pepcid  prn.

## 2023-10-14 NOTE — Assessment & Plan Note (Addendum)
 Sleeping better- had bipap- could not tolerate. Declines further evaluation.  Continue to work on weight loss.

## 2023-10-14 NOTE — Assessment & Plan Note (Signed)
 Did not complete CT. Will update.

## 2023-10-14 NOTE — Assessment & Plan Note (Addendum)
 Lab Results  Component Value Date   WBC 9.6 05/03/2023   HGB 13.2 05/03/2023   HCT 39.1 05/03/2023   MCV 92.6 05/03/2023   PLT 150.0 05/03/2023   Last platelet count normal.

## 2023-10-14 NOTE — Assessment & Plan Note (Signed)
 Managed by psychiatry. On lexapro  and remeron .

## 2023-10-14 NOTE — Progress Notes (Signed)
 Subjective:     Patient ID: [name], female    DOB: February 23, 1951, 73 y.o.   MRN: [id number]  Chief Complaint  Patient presents with   Follow-up    Says she is still having back pain and trouble walking even with the tramadol  and gabap.    HPI  Discussed the use of AI scribe software for clinical note transcription with the patient, who gave verbal consent to proceed.  History of Present Illness [name] is a 73 year old female who presents for follow-up. She is accompanied by her husband.  She experiences chronic back pain, managed with tramadol  twice daily and occasionally an additional dose in the mid-afternoon. Gabapentin  300 mg and 100 mg are taken three times a day. Tramadol  provides partial relief.  She has sleep apnea diagnosed via a home sleep study. CPAP mask use is difficult due to discomfort and frequent awakenings. Snoring occurs only when supine, and her partner notes no longer observing apneic episodes. No further treatment options have been pursued.  She has experienced weight loss, previously weighing 171 pounds, but has not lost weight recently despite dietary changes. She is on Zepbound  and has reduced snack and sweet intake.  She experiences chronic nausea and takes Pepcid  for heartburn, described as a burning sensation in her throat. Pepcid  is taken as needed, sometimes twice a day.  She is mostly homebound, using a walker for mobility and taking walks around her house for exercise.      Health Maintenance Due  Topic Date Due   DTaP/Tdap/Td (2 - Tdap) 06/07/2018    Past Medical History:  Diagnosis Date   AKI (acute kidney injury) (HCC) 01/09/2020   Allergy    seasonal   Anxiety    Arachnoiditis    Cataract    Depression    Elevated liver enzymes    Environmental allergies    GERD (gastroesophageal reflux disease)    H/O being hospitalized 03/2012   x1 week for nausea   History of chicken pox    Hyperlipidemia    Hypertension    Migraine  with typical aura    resolved years ago   Nausea    chronic nausea    Neuromuscular disorder (HCC)    Pneumonia 03/02/2022    Past Surgical History:  Procedure Laterality Date   ABDOMINOPLASTY  07/11/09   EUS N/A 05/05/2012   Procedure: UPPER ENDOSCOPIC ULTRASOUND (EUS) LINEAR;  Surgeon: Belvie JONETTA Just, MD;  Location: [facility];  Service: Endoscopy;  Laterality: N/A;   LAPAROSCOPIC CHOLECYSTECTOMY  2010   NASAL SINUS SURGERY     Has had 2-3 surgeries over the years   UMBILICAL HERNIA REPAIR N/A 10/06/2020   Procedure: UMBILICAL HERNIA REPAIR;  Surgeon: Vernetta Berg, MD;  Location: [facility];  Service: General;  Laterality: N/A;  LMA ANESTHESIA    Family History  Problem Relation Age of Onset   Alzheimer's disease Mother    Hypertension Mother    Migraines Mother    Hypertension Father    Other Father        ?carotid artery aneurysm   Cancer Brother        mouth and throat   Colon cancer Neg Hx     Social History   Socioeconomic History   Marital status: Married    Spouse name: Not on file   Number of children: Not on file   Years of education: Not on file   Highest education level: 12th grade  Occupational History   Not on file  Tobacco Use   Smoking status: Former    Current packs/day: 0.00    Average packs/day: 0.3 packs/day for 8.0 years (2.0 ttl pk-yrs)    Types: Cigarettes    Start date: 08/09/1978    Quit date: 08/09/1986    Years since quitting: 37.2   Smokeless tobacco: Never  Vaping Use   Vaping status: Never Used  Substance and Sexual Activity   Alcohol use: No   Drug use: No   Sexual activity: Not Currently    Partners: Male    Birth control/protection: Other-see comments, Post-menopausal    Comment: husband had vasectomy.   Other Topics Concern   Not on file  Social History Narrative   2 step daughters- 55 and 7   She worked as an Environmental health practitioner.   Enjoys puzzles, counted cross stitch.   Completed 1 year of  college   4 cats   Social Drivers of Health   Financial Resource Strain: Low Risk  (10/06/2023)   Overall Financial Resource Strain (CARDIA)    Difficulty of Paying Living Expenses: Not hard at all  Food Insecurity: No Food Insecurity (10/06/2023)   Hunger Vital Sign    Worried About Running Out of Food in the Last Year: Never true    Ran Out of Food in the Last Year: Never true  Transportation Needs: No Transportation Needs (10/06/2023)   PRAPARE - Administrator, Civil Service (Medical): No    Lack of Transportation (Non-Medical): No  Physical Activity: Inactive (10/06/2023)   Exercise Vital Sign    Days of Exercise per Week: 0 days    Minutes of Exercise per Session: Not on file  Stress: No Stress Concern Present (10/06/2023)   [name] of Occupational Health - Occupational Stress Questionnaire    Feeling of Stress: Only a little  Social Connections: Unknown (10/06/2023)   Social Connection and Isolation Panel    Frequency of Communication with Friends and Family: Once a week    Frequency of Social Gatherings with Friends and Family: Patient declined    Attends Religious Services: Never    Database administrator or Organizations: No    Attends Engineer, structural: Not on file    Marital Status: Married  Catering manager Violence: Not At Risk (04/26/2023)   Humiliation, Afraid, Rape, and Kick questionnaire    Fear of Current or Ex-Partner: No    Emotionally Abused: No    Physically Abused: No    Sexually Abused: No    Outpatient Medications Prior to Visit  Medication Sig Dispense Refill   amLODipine  (NORVASC ) 5 MG tablet Take 1 tablet (5 mg total) by mouth daily. 90 tablet 1   clonazePAM  (KLONOPIN ) 1 MG tablet Take 0.5 tablets (0.5 mg total) by mouth 2 (two) times daily as needed for anxiety. TAKE 1 TABLET BY MOUTH THREE TIMES A DAY AS NEEDED FOR ANXIETY     escitalopram  (LEXAPRO ) 10 MG tablet Take 1 tablet (10 mg total) by mouth daily.      gabapentin  (NEURONTIN ) 300 MG capsule Take 1 capsule (300 mg total) by mouth 3 (three) times daily. 90 capsule 5   mirtazapine  (REMERON ) 45 MG tablet Take 45 mg by mouth at bedtime.      Misc Natural Products (NEURIVA PO) Take by mouth.     Multiple Vitamin (MULTIVITAMIN) capsule Take 1 capsule by mouth daily. Dynamic brain     traMADol  (ULTRAM ) 50 MG tablet  TAKE 1 TABLET BY MOUTH EVERY 12 HOURS AS NEEDED 60 tablet 0   gabapentin  (NEURONTIN ) 100 MG capsule Take 100 mg by mouth in the morning, at noon, and at bedtime.     montelukast  (SINGULAIR ) 10 MG tablet TAKE 1 TABLET BY MOUTH EVERYDAY AT BEDTIME 90 tablet 0   tirzepatide  (ZEPBOUND ) 5 MG/0.5ML Pen INJECT 5 MG SUBCUTANEOUSLY WEEKLY 2 mL 2   Facility-Administered Medications Prior to Visit  Medication Dose Route Frequency Provider Last Rate Last Admin   albuterol  (PROVENTIL ) (2.5 MG/3ML) 0.083% nebulizer solution 2.5 mg  2.5 mg Nebulization Once         Allergies  Allergen Reactions   Compazine [Prochlorperazine]     hyperactivity   Morphine And Codeine  Hives and Itching   Trintellix [Vortioxetine] Nausea And Vomiting    ROS See HPI    Objective:    Physical Exam Constitutional:      General: She is not in acute distress.    Appearance: Normal appearance. She is well-developed.  HENT:     Head: Normocephalic and atraumatic.     Right Ear: External ear normal.     Left Ear: External ear normal.  Eyes:     General: No scleral icterus. Neck:     Thyroid : No thyromegaly.  Cardiovascular:     Rate and Rhythm: Normal rate and regular rhythm.     Heart sounds: Normal heart sounds. No murmur heard. Pulmonary:     Effort: Pulmonary effort is normal. No respiratory distress.     Breath sounds: Normal breath sounds. No wheezing.  Musculoskeletal:     Cervical back: Neck supple.  Skin:    General: Skin is warm and dry.  Neurological:     Mental Status: She is alert and oriented to person, place, and time.  Psychiatric:         Mood and Affect: Mood normal.        Behavior: Behavior normal.        Thought Content: Thought content normal.        Judgment: Judgment normal.      BP 133/69   Pulse 64   Temp 98.6 F (37 C) (Oral)   Resp 16   Ht 5' 1 (1.549 m)   Wt 156 lb (70.8 kg)   LMP 03/29/1993 Comment: 12/25/13 Pt states last cycle has been about 20 yrs ago.  SpO2 95%   BMI 29.48 kg/m  Wt Readings from Last 3 Encounters:  10/14/23 156 lb (70.8 kg)  08/02/23 159 lb (72.1 kg)  07/05/23 164 lb (74.4 kg)       Assessment & Plan:   Problem List Items Addressed This Visit       Unprioritized   Thrombocytopenia (HCC) - Primary   Lab Results  Component Value Date   WBC 9.6 05/03/2023   HGB 13.2 05/03/2023   HCT 39.1 05/03/2023   MCV 92.6 05/03/2023   PLT 150.0 05/03/2023    Last platelet count normal.       Snoring   Sleeping better- had bipap- could not tolerate. Declines further evaluation.  Continue to work on weight loss.      Overweight with body mass index (BMI) of 29 to 29.9 in adult   Weight loss has leveled off. Will increase tirzepatide  from 5mg  to 7.5mg .       Lung nodules   Did not complete CT. Will update.      Hypertension   BP Readings from Last 3 Encounters:  10/14/23 133/69  07/05/23 139/70  05/03/23 (!) 122/58   BP stable on amlodipine .       Chronic nausea   Stable.  Not currently on zofran . Just uses some otc pepcid  prn.       Arachnoiditis   Relevant Medications   gabapentin  (NEURONTIN ) 100 MG capsule   Anxiety and depression   Managed by psychiatry. On lexapro  and remeron .       Allergic rhinitis   Well controlled since she stays in the house mostly.       Other Visit Diagnoses       Lung nodule       Relevant Orders   CT Chest Wo Contrast       I have discontinued Monisha Bermea's montelukast  and Zepbound . I have also changed her gabapentin . Additionally, I am having her maintain her mirtazapine , multivitamin, escitalopram , Misc Natural  Products (NEURIVA PO), amLODipine , clonazePAM , traMADol , and gabapentin . We will continue to administer albuterol .  Meds ordered this encounter  Medications   DISCONTD: tirzepatide  7.5 MG/0.5ML injection vial    Sig: Inject 7.5 mg into the skin once a week.    Dispense:  2 mL    Refill:  2    Supervising Provider:   DOMENICA BLACKBIRD A [[id number]]   gabapentin  (NEURONTIN ) 100 MG capsule    Sig: Take 1 capsule (100 mg total) by mouth in the morning, at noon, and at bedtime.    Dispense:  270 capsule    Refill:  1    Supervising Provider:   DOMENICA BLACKBIRD A [[id number]]

## 2023-10-14 NOTE — Assessment & Plan Note (Signed)
 Well controlled since she stays in the house mostly.

## 2023-10-14 NOTE — Assessment & Plan Note (Signed)
 BP Readings from Last 3 Encounters:  10/14/23 133/69  07/05/23 139/70  05/03/23 (!) 122/58   BP stable on amlodipine .

## 2023-10-15 DIAGNOSIS — Z6829 Body mass index (BMI) 29.0-29.9, adult: Secondary | ICD-10-CM | POA: Insufficient documentation

## 2023-10-15 DIAGNOSIS — E663 Overweight: Secondary | ICD-10-CM | POA: Insufficient documentation

## 2023-10-15 MED ORDER — TIRZEPATIDE 7.5 MG/0.5ML ~~LOC~~ SOAJ: 7.5000 mg | SUBCUTANEOUS | 0 refills | Status: DC

## 2023-10-15 NOTE — Patient Instructions (Signed)
 VISIT SUMMARY:  Today, we discussed your chronic back pain, sleep apnea, weight management, lung nodules, chronic nausea and heartburn, and mental health. We made some adjustments to your medications and planned follow-up steps.  YOUR PLAN:  CHRONIC BACK PAIN: You have chronic back pain managed with tramadol  and gabapentin . -Continue taking tramadol  and gabapentin  as prescribed. -Your gabapentin  prescription has been refilled for 90 days.  OBSTRUCTIVE SLEEP APNEA: You have severe obstructive sleep apnea and have difficulty using CPAP and BiPAP. Weight loss may have improved your symptoms. -No changes to your current treatment plan for sleep apnea.  WEIGHT MANAGEMENT: You have experienced a plateau in your weight loss despite dietary changes and are currently on Zepbound . -Increase your Zepbound  dosage to 7.5 mg.  LUNG NODULES: Previous CT showed right upper lung nodules, likely post-pneumonia. -A noncontrast chest CT has been ordered to evaluate your lung nodules.  CHRONIC NAUSEA AND HEARTBURN: You experience chronic nausea and heartburn, which is managed with Pepcid . -Continue taking Pepcid  as needed for heartburn.  MENTAL HEALTH MANAGEMENT: You are under psychiatric care and taking clonazepam , mirtazapine , and escitalopram . -Continue your current mental health medications as prescribed.  GENERAL HEALTH MAINTENANCE: Your preventive care is up to date, and you are not taking Singulair  regularly due to reduced allergen exposure. -Singulair  has been removed from your medication list.  FOLLOW-UP: We need to assess your response to the increased Zepbound  dosage and update labs. -Schedule a follow-up appointment in three months. -Plan for lab work at your next visit if needed.

## 2023-10-15 NOTE — Assessment & Plan Note (Signed)
 Weight loss has leveled off. Will increase tirzepatide  from 5mg  to 7.5mg .

## 2023-10-17 ENCOUNTER — Other Ambulatory Visit (HOSPITAL_COMMUNITY): Payer: Self-pay

## 2023-10-17 ENCOUNTER — Telehealth: Payer: Self-pay

## 2023-10-17 NOTE — Telephone Encounter (Signed)
 Ozempic/Mounjaro  is approved exclusively as an adjunct to diet and exercise to improve glycemic  control in adults with type 2 diabetes mellitus. A review of patient's medical chart reveals no  documented diagnosis of type 2 diabetes or an A1C indicative of diabetes. Therefore, they do not  currently meet the criteria for prior authorization of this medication. If clinically appropriate, alternative  options such as Saxenda, Zepbound , or Georjean may be considered for this patient.  Patient has current approved PA for Zepbound  that expires 03/28/24. Current co-pay is $0.00.

## 2023-10-19 DIAGNOSIS — L309 Dermatitis, unspecified: Secondary | ICD-10-CM | POA: Diagnosis not present

## 2023-10-19 DIAGNOSIS — L814 Other melanin hyperpigmentation: Secondary | ICD-10-CM | POA: Diagnosis not present

## 2023-10-19 DIAGNOSIS — H61002 Unspecified perichondritis of left external ear: Secondary | ICD-10-CM | POA: Diagnosis not present

## 2023-10-19 DIAGNOSIS — L820 Inflamed seborrheic keratosis: Secondary | ICD-10-CM | POA: Diagnosis not present

## 2023-10-19 DIAGNOSIS — L821 Other seborrheic keratosis: Secondary | ICD-10-CM | POA: Diagnosis not present

## 2023-10-19 DIAGNOSIS — L578 Other skin changes due to chronic exposure to nonionizing radiation: Secondary | ICD-10-CM | POA: Diagnosis not present

## 2023-10-19 DIAGNOSIS — D225 Melanocytic nevi of trunk: Secondary | ICD-10-CM | POA: Diagnosis not present

## 2023-10-23 ENCOUNTER — Other Ambulatory Visit: Payer: Self-pay | Admitting: Family

## 2023-10-23 DIAGNOSIS — J309 Allergic rhinitis, unspecified: Secondary | ICD-10-CM

## 2023-10-23 DIAGNOSIS — G039 Meningitis, unspecified: Secondary | ICD-10-CM

## 2023-10-24 ENCOUNTER — Encounter: Payer: Self-pay | Admitting: Family

## 2023-10-24 DIAGNOSIS — G4733 Obstructive sleep apnea (adult) (pediatric): Secondary | ICD-10-CM

## 2023-10-24 DIAGNOSIS — G039 Meningitis, unspecified: Secondary | ICD-10-CM

## 2023-10-24 MED ORDER — TIRZEPATIDE-WEIGHT MANAGEMENT 7.5 MG/0.5ML ~~LOC~~ SOLN: 7.5000 mg | SUBCUTANEOUS | 2 refills | Status: DC

## 2023-10-24 NOTE — Telephone Encounter (Addendum)
 Requesting: tramadol  Contract:07/05/23 UDS:07/05/23 Last Visit:10/14/23 Next Visit:01/18/24 Last Refill:09/26/23  Please Advise

## 2023-10-27 ENCOUNTER — Telehealth: Payer: Self-pay | Admitting: Neurology

## 2023-10-27 MED ORDER — TRAMADOL HCL 50 MG PO TABS: 50.0000 mg | ORAL_TABLET | Freq: Two times a day (BID) | ORAL | 0 refills | Status: DC | PRN

## 2023-10-27 NOTE — Telephone Encounter (Signed)
 Copied from CRM #[id number]. Topic: Clinical - Medication Question >> Oct 27, 2023 10:12 AM [name] wrote: Reason for CRM: Patient called in regarding prescription traMADol  (ULTRAM ) 50 MG table , stated the pharmacy called her stating that prescription has been denied, patient would like a call from the nurse explaining why as she is out and needs a refill

## 2023-10-27 NOTE — Telephone Encounter (Signed)
 Message sent to provider, patient was advised a few days ago this was not filled due to refilling too early.

## 2023-10-27 NOTE — Addendum Note (Signed)
 Addended by: DARYL SETTER on: 10/27/2023 03:18 PM   Modules accepted: Orders

## 2023-10-27 NOTE — Telephone Encounter (Signed)
 Pt called back because she hadn't heard back. I did explain that her message was received and re-iterated the turn around time policy for messages sent before 3p.

## 2023-11-02 ENCOUNTER — Encounter: Payer: Self-pay | Admitting: Family

## 2023-11-04 NOTE — Telephone Encounter (Signed)
 Patient advised CT requested is for one year follow up/ monitoring of lung nodule

## 2023-11-08 ENCOUNTER — Encounter: Payer: Self-pay | Admitting: Family

## 2023-11-09 ENCOUNTER — Encounter: Payer: Self-pay | Admitting: Family

## 2023-11-16 ENCOUNTER — Ambulatory Visit (HOSPITAL_BASED_OUTPATIENT_CLINIC_OR_DEPARTMENT_OTHER)

## 2023-11-26 ENCOUNTER — Other Ambulatory Visit: Payer: Self-pay | Admitting: Family

## 2023-11-26 DIAGNOSIS — G039 Meningitis, unspecified: Secondary | ICD-10-CM

## 2023-11-29 ENCOUNTER — Encounter: Payer: Self-pay | Admitting: Family

## 2023-11-29 DIAGNOSIS — K5900 Constipation, unspecified: Secondary | ICD-10-CM

## 2023-11-29 NOTE — Telephone Encounter (Signed)
 Initial Comment Caller states she is all out of her Tramadol  50MG  Rx ad is requesting a refill. Caller confirms she is not experiencing any current Sx. Translation No Disp. Time Titus Time) Disposition Final User 11/26/2023 11:13:03 AM Send To Nurse Acey Muzzy, RN, Dorothyann 11/26/2023 5:22:37 PM FINAL ATTEMPT MADE - message left Yes McDonald, RN, Hadassah 11/26/2023 5:22:47 PM Send to RN Final Attempt Vallery Medicine, RN, Hadassah Final Disposition 11/26/2023 5:22:37 PM FINAL ATTEMPT MADE - message left Yes McDonald, RN, Hadassah

## 2023-12-08 ENCOUNTER — Ambulatory Visit (HOSPITAL_BASED_OUTPATIENT_CLINIC_OR_DEPARTMENT_OTHER)
Admission: RE | Admit: 2023-12-08 | Discharge: 2023-12-08 | Disposition: A | Discharge status: Home / Self Care | Source: Ambulatory Visit | Attending: Family | Admitting: Family

## 2023-12-08 ENCOUNTER — Ambulatory Visit: Payer: Self-pay | Admitting: Family

## 2023-12-08 DIAGNOSIS — K529 Noninfective gastroenteritis and colitis, unspecified: Secondary | ICD-10-CM | POA: Diagnosis not present

## 2023-12-08 DIAGNOSIS — K5900 Constipation, unspecified: Secondary | ICD-10-CM | POA: Insufficient documentation

## 2023-12-17 ENCOUNTER — Other Ambulatory Visit: Payer: Self-pay | Admitting: Family

## 2023-12-24 ENCOUNTER — Other Ambulatory Visit: Payer: Self-pay | Admitting: Family

## 2023-12-24 DIAGNOSIS — G039 Meningitis, unspecified: Secondary | ICD-10-CM

## 2023-12-25 ENCOUNTER — Encounter: Payer: Self-pay | Admitting: Family

## 2023-12-27 NOTE — Telephone Encounter (Signed)
 Refill sent on 12/26/23

## 2024-01-05 ENCOUNTER — Encounter: Payer: Self-pay | Admitting: Family

## 2024-01-05 DIAGNOSIS — K5900 Constipation, unspecified: Secondary | ICD-10-CM

## 2024-01-06 ENCOUNTER — Ambulatory Visit (HOSPITAL_BASED_OUTPATIENT_CLINIC_OR_DEPARTMENT_OTHER)
Admission: RE | Admit: 2024-01-06 | Discharge: 2024-01-06 | Disposition: A | Discharge status: Home / Self Care | Source: Ambulatory Visit | Attending: Family | Admitting: Family

## 2024-01-06 ENCOUNTER — Ambulatory Visit: Payer: Self-pay | Admitting: Family

## 2024-01-06 DIAGNOSIS — K5900 Constipation, unspecified: Secondary | ICD-10-CM | POA: Diagnosis not present

## 2024-01-06 NOTE — Telephone Encounter (Signed)

## 2024-01-17 ENCOUNTER — Other Ambulatory Visit: Payer: Self-pay | Admitting: Family

## 2024-01-17 DIAGNOSIS — G4733 Obstructive sleep apnea (adult) (pediatric): Secondary | ICD-10-CM

## 2024-01-17 MED ORDER — TIRZEPATIDE-WEIGHT MANAGEMENT 7.5 MG/0.5ML ~~LOC~~ SOLN
7.5000 mg | SUBCUTANEOUS | 0 refills | Status: DC
Start: 2024-01-17 — End: 2024-01-17

## 2024-01-17 MED ORDER — ZEPBOUND 7.5 MG/0.5ML ~~LOC~~ SOAJ: 7.5000 mg | SUBCUTANEOUS | 0 refills | Status: DC

## 2024-01-17 NOTE — Telephone Encounter (Signed)
 Copied from CRM [id number]. Topic: Clinical - Medication Refill >> Jan 17, 2024 12:39 PM Alfonso ORN wrote: Medication: tirzepatide  7.5 MG/0.5ML injection vial  Has the patient contacted their pharmacy? No   This is the patient's preferred pharmacy:  [id number] [address], [place] - [address] [place] [address] Phone: [phone] Fax: [phone]   Is this the correct pharmacy for this prescription? Yes If no, delete pharmacy and type the correct one.   Has the prescription been filled recently? Yes  Is the patient out of the medication? Yes  Has the patient been seen for an appointment in the last year OR does the patient have an upcoming appointment? Yes  Can we respond through MyChart? Yes

## 2024-01-18 ENCOUNTER — Ambulatory Visit: Admitting: Family

## 2024-01-18 VITALS — BP 115/61 | HR 63 | Temp 98.5°F | Resp 16 | Ht 61.0 in | Wt 147.0 lb

## 2024-01-18 DIAGNOSIS — I10 Essential (primary) hypertension: Secondary | ICD-10-CM

## 2024-01-18 DIAGNOSIS — F32A Depression, unspecified: Secondary | ICD-10-CM

## 2024-01-18 DIAGNOSIS — G039 Meningitis, unspecified: Secondary | ICD-10-CM

## 2024-01-18 DIAGNOSIS — L989 Disorder of the skin and subcutaneous tissue, unspecified: Secondary | ICD-10-CM | POA: Diagnosis not present

## 2024-01-18 DIAGNOSIS — Z6827 Body mass index (BMI) 27.0-27.9, adult: Secondary | ICD-10-CM

## 2024-01-18 DIAGNOSIS — G4733 Obstructive sleep apnea (adult) (pediatric): Secondary | ICD-10-CM

## 2024-01-18 DIAGNOSIS — E663 Overweight: Secondary | ICD-10-CM

## 2024-01-18 DIAGNOSIS — F419 Anxiety disorder, unspecified: Secondary | ICD-10-CM

## 2024-01-18 NOTE — Assessment & Plan Note (Signed)
 BP Readings from Last 3 Encounters:  01/18/24 115/61  10/14/23 133/69  07/05/23 139/70   BP stable on amlodipine .

## 2024-01-18 NOTE — Assessment & Plan Note (Signed)
 She is not using her BIPAP.  Declines further evaluation.

## 2024-01-18 NOTE — Progress Notes (Unsigned)
 Subjective:     Patient ID: [name], female    DOB: Sep 14, 1950, 73 y.o.   MRN: [id number]  Chief Complaint  Patient presents with   Arachnoiditis    Here for follow up, on tramadol . Last CSC and UDS 07/05/23    HPI  Discussed the use of AI scribe software for clinical note transcription with the patient, who gave verbal consent to proceed.  History of Present Illness  [name] is a 73 year old female who presents for a follow-up on her medications. She is accompanied by her husband to today's visit. She manages chronic pain with tramadol , taking an extra dose only on very bad days. She emphasizes responsible use of the medication.   She experiences sinus congestion with morning hoarseness and stuffed sinuses, which resolve without complications. She does not use her BiPAP machine for sleep apnea as it falls off, but she sleeps well without it and no longer stops breathing during sleep. S  he has lost weight from 171 pounds to 143 pounds with assistance from Zepbound .  This weight loss has alleviated her back pain and reduced her need for a walker. Her bowel movements alternate between diarrhea and constipation, managed with Miralax and Dulcolax, showing improvement over the past two weeks. She is taking Remeron , believed to be prescribed by Dr. Vincente and maintains a regular eating schedule with healthy meals. She has a persistent sore on her ear that remains painful despite treatment. She is experiencing emotional distress following her brother's passing, feeling guilty for not visiting him due to her back pain.      Health Maintenance Due  Topic Date Due   DTaP/Tdap/Td (2 - Tdap) 06/07/2018   COVID-19 Vaccine (9 - 2025-26 season) 11/28/2023    Past Medical History:  Diagnosis Date   AKI (acute kidney injury) 01/09/2020   Allergy    seasonal   Anxiety    Arachnoiditis    Cataract    Depression    Elevated liver enzymes    Environmental allergies    GERD  (gastroesophageal reflux disease)    H/O being hospitalized 03/2012   x1 week for nausea   History of chicken pox    Hyperlipidemia    Hypertension    Migraine with typical aura    resolved years ago   Nausea    chronic nausea    Neuromuscular disorder (HCC)    Pneumonia 03/02/2022    Past Surgical History:  Procedure Laterality Date   ABDOMINOPLASTY  07/11/09   EUS N/A 05/05/2012   Procedure: UPPER ENDOSCOPIC ULTRASOUND (EUS) LINEAR;  Surgeon: Belvie JONETTA Just, MD;  Location: [facility];  Service: Endoscopy;  Laterality: N/A;   LAPAROSCOPIC CHOLECYSTECTOMY  2010   NASAL SINUS SURGERY     Has had 2-3 surgeries over the years   UMBILICAL HERNIA REPAIR N/A 10/06/2020   Procedure: UMBILICAL HERNIA REPAIR;  Surgeon: Vernetta Berg, MD;  Location: [facility];  Service: General;  Laterality: N/A;  LMA ANESTHESIA    Family History  Problem Relation Age of Onset   Alzheimer's disease Mother    Hypertension Mother    Migraines Mother    Hypertension Father    Other Father        ?carotid artery aneurysm   Cancer Brother        mouth and throat   Colon cancer Neg Hx     Social History   Socioeconomic History   Marital status: Married    Spouse  name: Not on file   Number of children: Not on file   Years of education: Not on file   Highest education level: 12th grade  Occupational History   Not on file  Tobacco Use   Smoking status: Former    Current packs/day: 0.00    Average packs/day: 0.3 packs/day for 8.0 years (2.0 ttl pk-yrs)    Types: Cigarettes    Start date: 08/09/1978    Quit date: 08/09/1986    Years since quitting: 37.4   Smokeless tobacco: Never  Vaping Use   Vaping status: Never Used  Substance and Sexual Activity   Alcohol use: No   Drug use: No   Sexual activity: Not Currently    Partners: Male    Birth control/protection: Other-see comments, Post-menopausal    Comment: husband had vasectomy.   Other Topics Concern   Not on file   Social History Narrative   2 step daughters- 20 and 60   She worked as an Environmental health practitioner.   Enjoys puzzles, counted cross stitch.   Completed 1 year of college   4 cats   Social Drivers of Health   Financial Resource Strain: Low Risk  (10/06/2023)   Overall Financial Resource Strain (CARDIA)    Difficulty of Paying Living Expenses: Not hard at all  Food Insecurity: No Food Insecurity (10/06/2023)   Hunger Vital Sign    Worried About Running Out of Food in the Last Year: Never true    Ran Out of Food in the Last Year: Never true  Transportation Needs: No Transportation Needs (10/06/2023)   PRAPARE - Administrator, Civil Service (Medical): No    Lack of Transportation (Non-Medical): No  Physical Activity: Inactive (10/06/2023)   Exercise Vital Sign    Days of Exercise per Week: 0 days    Minutes of Exercise per Session: Not on file  Stress: No Stress Concern Present (10/06/2023)   [name] of Occupational Health - Occupational Stress Questionnaire    Feeling of Stress: Only a little  Social Connections: Unknown (10/06/2023)   Social Connection and Isolation Panel    Frequency of Communication with Friends and Family: Once a week    Frequency of Social Gatherings with Friends and Family: Patient declined    Attends Religious Services: Never    Database administrator or Organizations: No    Attends Engineer, structural: Not on file    Marital Status: Married  Catering manager Violence: Not At Risk (04/26/2023)   Humiliation, Afraid, Rape, and Kick questionnaire    Fear of Current or Ex-Partner: No    Emotionally Abused: No    Physically Abused: No    Sexually Abused: No    Outpatient Medications Prior to Visit  Medication Sig Dispense Refill   amLODipine  (NORVASC ) 5 MG tablet TAKE 1 TABLET (5 MG TOTAL) BY MOUTH DAILY. 90 tablet 1   clonazePAM  (KLONOPIN ) 1 MG tablet Take 0.5 tablets (0.5 mg total) by mouth 2 (two) times daily as needed for  anxiety. TAKE 1 TABLET BY MOUTH THREE TIMES A DAY AS NEEDED FOR ANXIETY     escitalopram  (LEXAPRO ) 10 MG tablet Take 1 tablet (10 mg total) by mouth daily.     gabapentin  (NEURONTIN ) 100 MG capsule Take 1 capsule (100 mg total) by mouth in the morning, at noon, and at bedtime. 270 capsule 1   gabapentin  (NEURONTIN ) 300 MG capsule Take 1 capsule (300 mg total) by mouth 3 (three) times daily. 90  capsule 5   mirtazapine  (REMERON ) 45 MG tablet Take 45 mg by mouth at bedtime.      Misc Natural Products (NEURIVA PO) Take by mouth.     Multiple Vitamin (MULTIVITAMIN) capsule Take 1 capsule by mouth daily. Dynamic brain     tirzepatide  (ZEPBOUND ) 7.5 MG/0.5ML Pen Inject 7.5 mg into the skin once a week. 2 mL 0   traMADol  (ULTRAM ) 50 MG tablet TAKE 1 TABLET BY MOUTH EVERY 12 HOURS AS NEEDED 60 tablet 0   Facility-Administered Medications Prior to Visit  Medication Dose Route Frequency Provider Last Rate Last Admin   albuterol  (PROVENTIL ) (2.5 MG/3ML) 0.083% nebulizer solution 2.5 mg  2.5 mg Nebulization Once         Allergies  Allergen Reactions   Compazine [Prochlorperazine]     hyperactivity   Morphine And Codeine  Hives and Itching   Trintellix [Vortioxetine] Nausea And Vomiting    ROS    See HPI Objective:    Physical Exam Constitutional:      General: She is not in acute distress.    Appearance: Normal appearance. She is well-developed.  HENT:     Head: Normocephalic and atraumatic.     Right Ear: External ear normal.     Left Ear: External ear normal.  Eyes:     General: No scleral icterus. Neck:     Thyroid : No thyromegaly.  Cardiovascular:     Rate and Rhythm: Normal rate and regular rhythm.     Heart sounds: Normal heart sounds. No murmur heard. Pulmonary:     Effort: Pulmonary effort is normal. No respiratory distress.     Breath sounds: Normal breath sounds. No wheezing.  Musculoskeletal:     Cervical back: Neck supple.  Skin:    General: Skin is warm and dry.      Comments: Sore noted top of left ear  Neurological:     Mental Status: She is alert and oriented to person, place, and time.  Psychiatric:        Mood and Affect: Mood normal.        Behavior: Behavior normal.        Thought Content: Thought content normal.        Judgment: Judgment normal.      BP 115/61 (BP Location: Right Arm, Patient Position: Sitting, Cuff Size: Small)   Pulse 63   Temp 98.5 F (36.9 C) (Oral)   Resp 16   Ht 5' 1 (1.549 m)   Wt 147 lb (66.7 kg)   LMP 03/29/1993 Comment: 12/25/13 Pt states last cycle has been about 20 yrs ago.  SpO2 94%   BMI 27.78 kg/m  Wt Readings from Last 3 Encounters:  01/18/24 147 lb (66.7 kg)  10/14/23 156 lb (70.8 kg)  08/02/23 159 lb (72.1 kg)       Assessment & Plan:   Problem List Items Addressed This Visit       Unprioritized   Skin lesion   Advised pt to follow back up with dermatology since lesion is non-healing.       Overweight with body mass index (BMI) of 27 to 27.9 in adult    Significant weight loss of 30 pounds, BMI 27. Weight loss improved back pain and mobility. Current Zepbound  dose effective, higher dose may worsen GI symptoms.  - Continue current weight management plan.  - Discuss with Dr. Vincente the potential impact of Remeron  on weight loss journey.        OSA (obstructive  sleep apnea)   She is not using her BIPAP.  Declines further evaluation.       Hypertension   BP Readings from Last 3 Encounters:  01/18/24 115/61  10/14/23 133/69  07/05/23 139/70   BP stable on amlodipine .        Arachnoiditis - Primary   Continues tramadol  prn with ongoing help with her pain management. UDS and contract up to date.      Anxiety and depression   Overall stable- management per psychiatry.        I am having Bonnie Overdorf maintain her mirtazapine , multivitamin, escitalopram , Misc Natural Products (NEURIVA PO), clonazePAM , gabapentin , gabapentin , amLODipine , traMADol , and Zepbound . We will continue to  administer albuterol .  No orders of the defined types were placed in this encounter.

## 2024-01-18 NOTE — Assessment & Plan Note (Signed)
 Continues tramadol  prn. UDS and contract up to date.

## 2024-01-19 DIAGNOSIS — H524 Presbyopia: Secondary | ICD-10-CM | POA: Diagnosis not present

## 2024-01-19 DIAGNOSIS — Z961 Presence of intraocular lens: Secondary | ICD-10-CM | POA: Diagnosis not present

## 2024-01-19 DIAGNOSIS — H26493 Other secondary cataract, bilateral: Secondary | ICD-10-CM | POA: Diagnosis not present

## 2024-01-19 DIAGNOSIS — Z135 Encounter for screening for eye and ear disorders: Secondary | ICD-10-CM | POA: Diagnosis not present

## 2024-01-19 DIAGNOSIS — L989 Disorder of the skin and subcutaneous tissue, unspecified: Secondary | ICD-10-CM | POA: Insufficient documentation

## 2024-01-19 NOTE — Patient Instructions (Addendum)
 VISIT SUMMARY:  Today, we reviewed your medications and discussed several health concerns, including chronic pain, weight loss, sleep apnea, bowel issues, a persistent ear sore, and emotional distress.  YOUR PLAN:  CHRONIC PAIN: You are managing your chronic pain with tramadol  and occasionally take an extra dose on very bad days. -Continue taking tramadol  as prescribed.  OBESITY: You have lost 30 pounds, which has improved your back pain and mobility. Your current weight management plan is effective. -Continue with your current weight management plan. -Discuss with Doctor Kirt the potential impact of Remeron  on your weight loss journey.  OBSTRUCTIVE SLEEP APNEA: You are not using your BiPAP machine post weight loss and report improved sleep without it. -Consider a follow-up with the pulmonary team for a repeat sleep study to assess if your sleep apnea has resolved.  CONSTIPATION AND DIARRHEA: You experience alternating constipation and diarrhea, likely related to your medications, and manage it with Miralax and Dulcolax. -Continue using Miralax and Dulcolax as needed for bowel regulation.  CHRONIC left EAR LESION: You have a persistent sore on your ear that has not improved with topical treatment. -Contact your dermatologist to discuss the lack of improvement and consider a biopsy or removal of the lesion.  DEPRESSION: You are managing your depression with Remeron  but have concerns about its impact on your appetite and weight. -Discuss with Dr. Vincente the continuation of Remeron  in the context of your weight loss and mental health.

## 2024-01-19 NOTE — Assessment & Plan Note (Signed)
 Advised pt to follow back up with dermatology since lesion is non-healing.

## 2024-01-19 NOTE — Assessment & Plan Note (Signed)
  Significant weight loss of 30 pounds, BMI 27. Weight loss improved back pain and mobility. Current Zepbound  dose effective, higher dose may worsen GI symptoms.  - Continue current weight management plan.  - Discuss with Dr. Vincente the potential impact of Remeron  on weight loss journey.

## 2024-01-19 NOTE — Assessment & Plan Note (Signed)
 Overall stable- management per psychiatry.

## 2024-01-21 ENCOUNTER — Encounter: Payer: Self-pay | Admitting: Family

## 2024-01-23 ENCOUNTER — Other Ambulatory Visit: Payer: Self-pay | Admitting: Family

## 2024-01-23 ENCOUNTER — Other Ambulatory Visit: Payer: Self-pay

## 2024-01-23 DIAGNOSIS — G039 Meningitis, unspecified: Secondary | ICD-10-CM

## 2024-01-24 ENCOUNTER — Other Ambulatory Visit: Payer: Self-pay | Admitting: Family

## 2024-01-24 DIAGNOSIS — G039 Meningitis, unspecified: Secondary | ICD-10-CM

## 2024-01-25 ENCOUNTER — Other Ambulatory Visit (HOSPITAL_BASED_OUTPATIENT_CLINIC_OR_DEPARTMENT_OTHER): Payer: Self-pay

## 2024-01-25 MED ORDER — TRAMADOL HCL 50 MG PO TABS
50.0000 mg | ORAL_TABLET | Freq: Two times a day (BID) | ORAL | 0 refills | Status: DC | PRN
  Filled 2024-01-25: qty 60, 30d supply, fill #0

## 2024-02-02 ENCOUNTER — Encounter: Payer: Self-pay | Admitting: Family

## 2024-02-02 DIAGNOSIS — L989 Disorder of the skin and subcutaneous tissue, unspecified: Secondary | ICD-10-CM

## 2024-02-03 ENCOUNTER — Telehealth: Payer: Self-pay | Admitting: Family

## 2024-02-03 DIAGNOSIS — J309 Allergic rhinitis, unspecified: Secondary | ICD-10-CM

## 2024-02-03 MED ORDER — MONTELUKAST SODIUM 10 MG PO TABS: 10.0000 mg | ORAL_TABLET | Freq: Every day | ORAL | 1 refills | Status: AC

## 2024-02-03 NOTE — Telephone Encounter (Signed)
 Medication: Montelukast  10MG  - not found on current active or outside med recs

## 2024-02-03 NOTE — Telephone Encounter (Signed)
**Note De-identified  [place] Obfuscation** Please advise 

## 2024-02-03 NOTE — Telephone Encounter (Signed)
 Copied from CRM [id number]. Topic: Clinical - Medication Refill >> Feb 03, 2024  9:53 AM Victoria A wrote: Medication: Montelukast  10MG   Has the patient contacted their pharmacy? Yes (Agent: If no, request that the patient contact the pharmacy for the refill. If patient does not wish to contact the pharmacy document the reason why and proceed with request.) (Agent: If yes, when and what did the pharmacy advise?)  This is the patient's preferred pharmacy:  [id number] [address], [place] - [address] [place]  [address] Phone: [phone] Fax: [phone]  Is this the correct pharmacy for this prescription? Yes If no, delete pharmacy and type the correct one.   Has the prescription been filled recently? No  Is the patient out of the medication? No Has 5 left  Has the patient been seen for an appointment in the last year OR does the patient have an upcoming appointment? Yes  Can we respond through MyChart? Yes  Agent: Please be advised that Rx refills may take up to 3 business days. We ask that you follow-up with your pharmacy.

## 2024-02-07 ENCOUNTER — Other Ambulatory Visit: Payer: Self-pay | Admitting: Family

## 2024-02-25 ENCOUNTER — Other Ambulatory Visit: Payer: Self-pay | Admitting: Family

## 2024-02-25 MED ORDER — TRAMADOL HCL 50 MG PO TABS
50.0000 mg | ORAL_TABLET | Freq: Two times a day (BID) | ORAL | 0 refills | Status: DC | PRN
  Filled 2024-02-25: qty 60, 30d supply, fill #0

## 2024-02-25 NOTE — Telephone Encounter (Signed)
 Requesting: tramadol  Contract: Yes UDS: 07/05/23 Last Visit: 01/18/2024 Next Visit: 04/20/2024 Last Refill: 01/25/24  Please Advise

## 2024-02-26 ENCOUNTER — Other Ambulatory Visit (HOSPITAL_BASED_OUTPATIENT_CLINIC_OR_DEPARTMENT_OTHER): Payer: Self-pay

## 2024-02-26 ENCOUNTER — Other Ambulatory Visit: Payer: Self-pay | Admitting: Family

## 2024-02-26 DIAGNOSIS — G039 Meningitis, unspecified: Secondary | ICD-10-CM

## 2024-02-27 ENCOUNTER — Other Ambulatory Visit (HOSPITAL_BASED_OUTPATIENT_CLINIC_OR_DEPARTMENT_OTHER): Payer: Self-pay

## 2024-02-28 ENCOUNTER — Other Ambulatory Visit (HOSPITAL_BASED_OUTPATIENT_CLINIC_OR_DEPARTMENT_OTHER): Payer: Self-pay

## 2024-03-08 ENCOUNTER — Other Ambulatory Visit: Payer: Self-pay | Admitting: Family

## 2024-03-08 DIAGNOSIS — G039 Meningitis, unspecified: Secondary | ICD-10-CM

## 2024-03-09 ENCOUNTER — Encounter: Payer: Self-pay | Admitting: Family

## 2024-03-09 ENCOUNTER — Telehealth: Payer: Self-pay | Admitting: Family

## 2024-03-09 NOTE — Telephone Encounter (Signed)
 Pt dropped off handicap placard form for pcp to fill out. Please call pt when forms are ready to be picked up.

## 2024-03-12 NOTE — Telephone Encounter (Signed)
 Form in the back for provider to sign, provider not in office today

## 2024-03-26 ENCOUNTER — Other Ambulatory Visit: Payer: Self-pay | Admitting: Family

## 2024-03-26 MED ORDER — TRAMADOL HCL 50 MG PO TABS
50.0000 mg | ORAL_TABLET | Freq: Two times a day (BID) | ORAL | 0 refills | Status: DC | PRN
  Filled 2024-03-26 – 2024-03-27 (×2): qty 60, 30d supply, fill #0

## 2024-03-27 ENCOUNTER — Other Ambulatory Visit (HOSPITAL_BASED_OUTPATIENT_CLINIC_OR_DEPARTMENT_OTHER): Payer: Self-pay

## 2024-04-05 ENCOUNTER — Encounter: Payer: Self-pay | Admitting: Family

## 2024-04-06 ENCOUNTER — Telehealth: Admitting: Family

## 2024-04-06 DIAGNOSIS — J329 Chronic sinusitis, unspecified: Secondary | ICD-10-CM

## 2024-04-06 MED ORDER — AMOXICILLIN-POT CLAVULANATE 875-125 MG PO TABS: 1.0000 | ORAL_TABLET | Freq: Two times a day (BID) | ORAL | 0 refills | Status: DC

## 2024-04-06 NOTE — Telephone Encounter (Signed)
Patient scheduled for 2 pm

## 2024-04-06 NOTE — Progress Notes (Unsigned)
 "   MyChart Video Visit    Virtual Visit via Video Note    Patient location: Home. Patient and provider in visit Provider location: Office  I discussed the limitations of evaluation and management by telemedicine and the availability of in person appointments. The patient expressed understanding and agreed to proceed.  Visit Date: 04/06/2024  Today's healthcare provider: Eleanor GORMAN Ponto, NP     Subjective:    Patient ID: [name], female    DOB: February 17, 1951, 74 y.o.   MRN: [id number]  Chief Complaint  Patient presents with   Sinus Problem    Patient complains of sinus congestion   Cough    Patient complains of cough   Sore Throat    Patient complains of sore throat    HPI  Past Medical History:  Diagnosis Date   AKI (acute kidney injury) 01/09/2020   Allergy    seasonal   Anxiety    Arachnoiditis    Cataract    Depression    Elevated liver enzymes    Environmental allergies    GERD (gastroesophageal reflux disease)    H/O being hospitalized 03/2012   x1 week for nausea   History of chicken pox    Hyperlipidemia    Hypertension    Migraine with typical aura    resolved years ago   Nausea    chronic nausea    Neuromuscular disorder (HCC)    Pneumonia 03/02/2022    Past Surgical History:  Procedure Laterality Date   ABDOMINOPLASTY  07/11/09   EUS N/A 05/05/2012   Procedure: UPPER ENDOSCOPIC ULTRASOUND (EUS) LINEAR;  Surgeon: Belvie JONETTA Just, MD;  Location: [facility];  Service: Endoscopy;  Laterality: N/A;   LAPAROSCOPIC CHOLECYSTECTOMY  2010   NASAL SINUS SURGERY     Has had 2-3 surgeries over the years   UMBILICAL HERNIA REPAIR N/A 10/06/2020   Procedure: UMBILICAL HERNIA REPAIR;  Surgeon: Vernetta Berg, MD;  Location:  SURGERY CENTER;  Service: General;  Laterality: N/A;  LMA ANESTHESIA    Family History  Problem Relation Age of Onset   Alzheimer's disease Mother    Hypertension Mother    Migraines Mother    Hypertension  Father    Other Father        ?carotid artery aneurysm   Cancer Brother        mouth and throat   Colon cancer Neg Hx     Social History   Socioeconomic History   Marital status: Married    Spouse name: Not on file   Number of children: Not on file   Years of education: Not on file   Highest education level: 12th grade  Occupational History   Not on file  Tobacco Use   Smoking status: Former    Current packs/day: 0.00    Average packs/day: 0.3 packs/day for 8.0 years (2.0 ttl pk-yrs)    Types: Cigarettes    Start date: 08/09/1978    Quit date: 08/09/1986    Years since quitting: 37.6   Smokeless tobacco: Never  Vaping Use   Vaping status: Never Used  Substance and Sexual Activity   Alcohol use: No   Drug use: No   Sexual activity: Not Currently    Partners: Male    Birth control/protection: Other-see comments, Post-menopausal    Comment: husband had vasectomy.   Other Topics Concern   Not on file  Social History Narrative   2 step daughters- 21 and 37   She worked  as an environmental health practitioner.   Enjoys puzzles, counted cross stitch.   Completed 1 year of college   4 cats   Social Drivers of Health   Tobacco Use: Medium Risk (04/26/2023)   Patient History    Smoking Tobacco Use: Former    Smokeless Tobacco Use: Never    Passive Exposure: Not on file  Financial Resource Strain: Low Risk (10/06/2023)   Overall Financial Resource Strain (CARDIA)    Difficulty of Paying Living Expenses: Not hard at all  Food Insecurity: No Food Insecurity (10/06/2023)   Epic    Worried About Programme Researcher, Broadcasting/film/video in the Last Year: Never true    Ran Out of Food in the Last Year: Never true  Transportation Needs: No Transportation Needs (10/06/2023)   Epic    Lack of Transportation (Medical): No    Lack of Transportation (Non-Medical): No  Physical Activity: Inactive (10/06/2023)   Exercise Vital Sign    Days of Exercise per Week: 0 days    Minutes of Exercise per Session: Not on  file  Stress: No Stress Concern Present (10/06/2023)   [employer] of Occupational Health - Occupational Stress Questionnaire    Feeling of Stress: Only a little  Social Connections: Unknown (10/06/2023)   Social Connection and Isolation Panel    Frequency of Communication with Friends and Family: Once a week    Frequency of Social Gatherings with Friends and Family: Patient declined    Attends Religious Services: Never    Database Administrator or Organizations: No    Attends Engineer, Structural: Not on file    Marital Status: Married  Catering Manager Violence: Not At Risk (04/26/2023)   Humiliation, Afraid, Rape, and Kick questionnaire    Fear of Current or Ex-Partner: No    Emotionally Abused: No    Physically Abused: No    Sexually Abused: No  Depression (PHQ2-9): Low Risk (10/14/2023)   Depression (PHQ2-9)    PHQ-2 Score: 2  Alcohol Screen: Low Risk (04/26/2023)   Alcohol Screen    Last Alcohol Screening Score (AUDIT): 0  Housing: Unknown (10/06/2023)   Epic    Unable to Pay for Housing in the Last Year: No    Number of Times Moved in the Last Year: Not on file    Homeless in the Last Year: No  Utilities: Not At Risk (04/26/2023)   AHC Utilities    Threatened with loss of utilities: No  Health Literacy: Adequate Health Literacy (04/26/2023)   B1300 Health Literacy    Frequency of need for help with medical instructions: Never    Outpatient Medications Prior to Visit  Medication Sig Dispense Refill   amLODipine  (NORVASC ) 5 MG tablet TAKE 1 TABLET (5 MG TOTAL) BY MOUTH DAILY. 90 tablet 1   escitalopram  (LEXAPRO ) 10 MG tablet Take 1 tablet (10 mg total) by mouth daily.     gabapentin  (NEURONTIN ) 100 MG capsule Take 1 capsule (100 mg total) by mouth in the morning, at noon, and at bedtime. 270 capsule 1   gabapentin  (NEURONTIN ) 300 MG capsule Take 1 capsule (300 mg total) by mouth 3 (three) times daily. 90 capsule 5   mirtazapine  (REMERON ) 45 MG tablet Take 45 mg  by mouth at bedtime.      montelukast  (SINGULAIR ) 10 MG tablet Take 1 tablet (10 mg total) by mouth at bedtime. 90 tablet 1   Multiple Vitamin (MULTIVITAMIN) capsule Take 1 capsule by mouth daily. Dynamic brain  tirzepatide  (ZEPBOUND ) 7.5 MG/0.5ML Pen INJECT 7.5 MG SUBCUTANEOUSLY WEEKLY 6 mL 0   traMADol  (ULTRAM ) 50 MG tablet Take 1 tablet (50 mg total) by mouth every 12 (twelve) hours as needed. 60 tablet 0   clonazePAM  (KLONOPIN ) 1 MG tablet Take 0.5 tablets (0.5 mg total) by mouth 2 (two) times daily as needed for anxiety. TAKE 1 TABLET BY MOUTH THREE TIMES A DAY AS NEEDED FOR ANXIETY     Misc Natural Products (NEURIVA PO) Take by mouth.     traMADol  (ULTRAM ) 50 MG tablet TAKE 1 TABLET BY MOUTH EVERY 12 HOURS AS NEEDED 60 tablet 0   traMADol  (ULTRAM ) 50 MG tablet TAKE 1 TABLET BY MOUTH EVERY 12 HOURS AS NEEDED 60 tablet 0   Facility-Administered Medications Prior to Visit  Medication Dose Route Frequency Provider Last Rate Last Admin   albuterol  (PROVENTIL ) (2.5 MG/3ML) 0.083% nebulizer solution 2.5 mg  2.5 mg Nebulization Once         Allergies[1]  ROS     Objective:    Physical Exam  LMP 03/29/1993 Comment: 12/25/13 Pt states last cycle has been about 20 yrs ago. Wt Readings from Last 3 Encounters:  01/18/24 147 lb (66.7 kg)  10/14/23 156 lb (70.8 kg)  08/02/23 159 lb (72.1 kg)       Assessment & Plan:   Problem List Items Addressed This Visit   None   I am having [name] maintain her mirtazapine , multivitamin, escitalopram , Misc Natural Products (NEURIVA PO), clonazePAM , gabapentin , gabapentin , amLODipine , traMADol , montelukast , Zepbound , traMADol , and traMADol . We will continue to administer albuterol .  No orders of the defined types were placed in this encounter.   I discussed the assessment and treatment plan with the patient. The patient was provided an opportunity to ask questions and all were answered. The patient agreed with the plan and demonstrated an  understanding of the instructions.   The patient was advised to call back or seek an in-person evaluation if the symptoms worsen or if the condition fails to improve as anticipated.  I provided *** minutes of face-to-face time during this encounter.   Eleanor GORMAN Ponto, NP [facility] [phone] (phone) [phone] (fax)  [facility]     [1]  Allergies Allergen Reactions   Compazine [Prochlorperazine]     hyperactivity   Morphine And Codeine  Hives and Itching   Trintellix [Vortioxetine] Nausea And Vomiting   "

## 2024-04-06 NOTE — Telephone Encounter (Signed)
 Please schedule virtual visit this afternoon.

## 2024-04-08 NOTE — Patient Instructions (Signed)
" °  VISIT SUMMARY: During your visit, we discussed your ongoing sinus congestion and facial pressure that you have been experiencing for the past seven days. You also mentioned having a pulsating headache, sore throat, and nausea. Despite using over-the-counter medications, your symptoms have not improved.  YOUR PLAN: -ACUTE SINUSITIS: Acute sinusitis is an infection or inflammation of the sinuses that can cause congestion, facial pressure, headache, sore throat, and nausea. Since your symptoms have persisted for seven days and over-the-counter medications have not been effective, I have prescribed Augmentin , which is an antibiotic. Please take it as directed. Be aware that it may cause yeast infections, and we discussed management options for this. Additionally, I recommend using nasal saline, taking warm showers, and increasing your fluid intake to help alleviate your symptoms. For pain relief, you can use Tylenol , but avoid taking additional acetaminophen  if you are using DayQuil or NyQuil. Mucinex  and Tylenol  or Motrin can be used as alternatives to DayQuil or NyQuil. Continue taking Zyrtec  for congestion relief. Monitor your symptoms and report back if there is no improvement after seven days.  INSTRUCTIONS: Please follow the prescribed treatment plan and monitor your symptoms. If there is no improvement after seven days, report back to us  for further evaluation.                    "

## 2024-04-08 NOTE — Assessment & Plan Note (Signed)
" °  Symptoms persistent for seven days. Over-the-counter medications ineffective. Augmentin  considered for treatment. Discussed potential yeast infection as a side effect and management options. - Prescribed Augmentin , sent prescription to [facility]. - Advised nasal saline, warm showers, increased fluid intake. - Recommended Tylenol  for pain, avoid additional acetaminophen  with DayQuil/Nyquil. - Suggested Mucinex  and Tylenol  or Motrin as alternatives to DayQuil/Nyquil. - Advised Zyrtec  for congestion relief. - Instructed to monitor symptoms, report if no improvement after seven days. "

## 2024-04-09 NOTE — Telephone Encounter (Unsigned)
 Copied from CRM #[id number]. Topic: General - Other >> Apr 09, 2024 12:15 PM Rachelle R wrote: Reason for CRM: Patient dropped off for for handicap placard to be filled out on 03/09/24. Has not received a callback advising if the form is ready. Patient is requesting a call to verify the status.  Patient can be reached at [phone]

## 2024-04-11 ENCOUNTER — Other Ambulatory Visit (HOSPITAL_BASED_OUTPATIENT_CLINIC_OR_DEPARTMENT_OTHER): Payer: Self-pay | Admitting: Family

## 2024-04-11 DIAGNOSIS — Z1231 Encounter for screening mammogram for malignant neoplasm of breast: Secondary | ICD-10-CM

## 2024-04-12 ENCOUNTER — Other Ambulatory Visit: Payer: Self-pay | Admitting: Family

## 2024-04-12 DIAGNOSIS — G039 Meningitis, unspecified: Secondary | ICD-10-CM

## 2024-04-13 ENCOUNTER — Telehealth: Payer: Self-pay

## 2024-04-13 MED ORDER — ZEPBOUND 7.5 MG/0.5ML ~~LOC~~ SOAJ: 7.5000 mg | SUBCUTANEOUS | 0 refills | Status: DC

## 2024-04-13 NOTE — Telephone Encounter (Signed)
 Copied from CRM [id number]. Topic: Clinical - Medication Refill >> Apr 13, 2024 12:52 PM [name] wrote: Medication: tirzepatide  (ZEPBOUND ) 7.5 MG/0.5ML Pen  Has the patient contacted their pharmacy? Yes (Agent: If no, request that the patient contact the pharmacy for the refill. If patient does not wish to contact the pharmacy document the reason why and proceed with request.) (Agent: If yes, when and what did the pharmacy advise?)  This is the patient's preferred pharmacy:  [id number] [address] [place], [place] - [address] [place] [address] Phone: [phone] Fax: [phone]    Is this the correct pharmacy for this prescription? Yes If no, delete pharmacy and type the correct one.   Has the prescription been filled recently? Yes  Is the patient out of the medication? Yes  Has the patient been seen for an appointment in the last year OR does the patient have an upcoming appointment? Yes  Can we respond through MyChart? Yes  Agent: Please be advised that Rx refills may take up to 3 business days. We ask that you follow-up with your pharmacy.

## 2024-04-17 ENCOUNTER — Other Ambulatory Visit: Payer: Self-pay

## 2024-04-17 ENCOUNTER — Other Ambulatory Visit (HOSPITAL_BASED_OUTPATIENT_CLINIC_OR_DEPARTMENT_OTHER): Payer: Self-pay

## 2024-04-17 MED ORDER — ZEPBOUND 7.5 MG/0.5ML ~~LOC~~ SOAJ
7.5000 mg | SUBCUTANEOUS | 0 refills | Status: AC
  Filled 2024-04-17: qty 2, 28d supply, fill #0
  Filled 2024-04-21: qty 6, 84d supply, fill #0

## 2024-04-18 ENCOUNTER — Telehealth (HOSPITAL_BASED_OUTPATIENT_CLINIC_OR_DEPARTMENT_OTHER): Payer: Self-pay

## 2024-04-18 ENCOUNTER — Telehealth: Payer: Self-pay

## 2024-04-18 ENCOUNTER — Other Ambulatory Visit (HOSPITAL_BASED_OUTPATIENT_CLINIC_OR_DEPARTMENT_OTHER): Payer: Self-pay

## 2024-04-18 ENCOUNTER — Other Ambulatory Visit (HOSPITAL_COMMUNITY): Payer: Self-pay

## 2024-04-18 NOTE — Telephone Encounter (Signed)
 Refills sent yesterday

## 2024-04-18 NOTE — Telephone Encounter (Signed)
 Pharmacy Patient Advocate Encounter   Received notification from Pt Calls Messages that prior authorization for Zepbound  7.5 mg/0.5 ml auto injectors is required/requested.   Insurance verification completed.   The patient is insured through [employer].   Per test claim: Per test claim, medication is not covered due to plan/benefit exclusion, PA not submitted at this time

## 2024-04-18 NOTE — Telephone Encounter (Signed)
 Copied from CRM #[id number]. Topic: Clinical - Medication Refill >> Apr 18, 2024 11:24 AM Zebedee SAUNDERS wrote: Medication: tirzepatide  (ZEPBOUND ) 7.5 MG/0.5ML Pen  Has the patient contacted their pharmacy? Yes (Agent: If no, request that the patient contact the pharmacy for the refill. If patient does not wish to contact the pharmacy document the reason why and proceed with request.) (Agent: If yes, when and what did the pharmacy advise?)Pharmacy need script  This is the patient's preferred pharmacy:   [facility] [place] [facility] [address] [place] [address] Phone: [phone] Fax: [phone]  Is this the correct pharmacy for this prescription? Yes If no, delete pharmacy and type the correct one.   Has the prescription been filled recently? No  Is the patient out of the medication? Yes  Has the patient been seen for an appointment in the last year OR does the patient have an upcoming appointment? Yes  Can we respond through MyChart? Yes  Agent: Please be advised that Rx refills may take up to 3 business days. We ask that you follow-up with your pharmacy.

## 2024-04-20 ENCOUNTER — Other Ambulatory Visit (HOSPITAL_COMMUNITY): Payer: Self-pay

## 2024-04-20 ENCOUNTER — Ambulatory Visit: Admitting: Family

## 2024-04-22 ENCOUNTER — Other Ambulatory Visit: Payer: Self-pay

## 2024-04-24 ENCOUNTER — Other Ambulatory Visit: Payer: Self-pay

## 2024-04-24 ENCOUNTER — Telehealth: Payer: Self-pay

## 2024-04-24 ENCOUNTER — Other Ambulatory Visit: Payer: Self-pay | Admitting: Family

## 2024-04-24 ENCOUNTER — Other Ambulatory Visit (HOSPITAL_BASED_OUTPATIENT_CLINIC_OR_DEPARTMENT_OTHER): Payer: Self-pay

## 2024-04-24 DIAGNOSIS — G039 Meningitis, unspecified: Secondary | ICD-10-CM

## 2024-04-24 MED ORDER — TRAMADOL HCL 50 MG PO TABS: 50.0000 mg | ORAL_TABLET | Freq: Two times a day (BID) | ORAL | 0 refills | Status: DC | PRN

## 2024-04-24 MED ORDER — ZEPBOUND 7.5 MG/0.5ML ~~LOC~~ SOAJ
7.5000 mg | SUBCUTANEOUS | 0 refills | Status: AC
  Filled 2024-04-24: qty 2, 28d supply, fill #0
  Filled 2024-04-29: qty 6, 84d supply, fill #0

## 2024-04-24 NOTE — Telephone Encounter (Signed)
 Copied from CRM #[id number]. Topic: Clinical - Medication Refill >> Apr 24, 2024 12:54 PM Nessti S wrote: Medication: traMADol  (ULTRAM ) 50 MG tablet  Has the patient contacted their pharmacy? Yes (Agent: If no, request that the patient contact the pharmacy for the refill. If patient does not wish to contact the pharmacy document the reason why and proceed with request.) (Agent: If yes, when and what did the pharmacy advise?)  This is the patient's preferred pharmacy:  [facility] [place] - [facility] [address] [place] [address] Phone: [phone] Fax: [phone]  Is this the correct pharmacy for this prescription? Yes If no, delete pharmacy and type the correct one.   Has the prescription been filled recently? No  Is the patient out of the medication?No. Has 1 left  Has the patient been seen for an appointment in the last year OR does the patient have an upcoming appointment? Yes  Can we respond through MyChart? Yes  Agent: Please be advised that Rx refills may take up to 3 business days. We ask that you follow-up with your pharmacy.

## 2024-04-24 NOTE — Telephone Encounter (Signed)
Rx. Filled.

## 2024-04-24 NOTE — Telephone Encounter (Signed)
 Medication refill was sent in to [facility]. Thanks

## 2024-04-25 ENCOUNTER — Other Ambulatory Visit (HOSPITAL_COMMUNITY): Payer: Self-pay

## 2024-04-25 ENCOUNTER — Other Ambulatory Visit (HOSPITAL_BASED_OUTPATIENT_CLINIC_OR_DEPARTMENT_OTHER): Payer: Self-pay

## 2024-04-27 ENCOUNTER — Ambulatory Visit: Admitting: *Deleted

## 2024-04-27 VITALS — Ht 61.0 in | Wt 137.0 lb

## 2024-04-27 DIAGNOSIS — Z0000 Encounter for general adult medical examination without abnormal findings: Secondary | ICD-10-CM | POA: Diagnosis not present

## 2024-04-27 NOTE — Patient Instructions (Addendum)
 Ms. Bastien,  Thank you for taking the time for your Medicare Wellness Visit. I appreciate your continued commitment to your health goals. Please review the care plan we discussed, and feel free to reach out if I can assist you further.  Please note that Annual Wellness Visits do not include a physical exam. Some assessments may be limited, especially if the visit was conducted virtually. If needed, we may recommend an in-person follow-up with your provider.  Ongoing Care Seeing your primary care provider every 3 to 6 months helps us  monitor your health and provide consistent, personalized care.   Eleanor Ponto, NP:  05/02/24 1pm;   05/23/24 1:40pm Medicare AWV:  04/29/25 3pm, mychart video visit  Referrals If a referral was made during today's visit and you haven't received any updates within two weeks, please contact the referred provider directly to check on the status.  Mammogram [employer] [facility] [place]) : 05/28/24 1:20pm Bone Density ([facility]): [phone], you may want to see if they can set it up the same day as your mammogram.  Recommended Screenings:  You will need to get the following vaccines at your local pharmacy: Tetanus.  The pharmacy did not have any date for a tetanus booster for you so you are due for that.  Health Maintenance  Topic Date Due   DTaP/Tdap/Td vaccine (2 - Tdap) 06/07/2018   Medicare Annual Wellness Visit  04/25/2024   COVID-19 Vaccine (10 - Pfizer risk 2025-26 season) 08/04/2024   Breast Cancer Screening  05/25/2025   Colon Cancer Screening  08/06/2025   Pneumococcal Vaccine for age over 49  Completed   Flu Shot  Completed   Osteoporosis screening with Bone Density Scan  Completed   Hepatitis C Screening  Completed   Zoster (Shingles) Vaccine  Completed   Meningitis B Vaccine  Aged Out       04/27/2024    3:13 PM  Advanced Directives  Does Patient Have a Medical Advance Directive? Yes  Type of Estate Agent of  [place];Living will  Does patient want to make changes to medical advance directive? No - Patient declined  Copy of Healthcare Power of Attorney in Chart? No - copy requested  Once completed and notarized, you may return a copy of your Advanced Directive(s) by either of the following: Bring a copy of your health care power of attorney and living will to the office to be added to your chart at your convenience. You can mail a copy to [facility] [address] [place], [place] [address] or email to [email or web address]   Vision: Annual vision screenings are recommended for early detection of glaucoma, cataracts, and diabetic retinopathy. These exams can also reveal signs of chronic conditions such as diabetes and high blood pressure.  Dental: Annual dental screenings help detect early signs of oral cancer, gum disease, and other conditions linked to overall health, including heart disease and diabetes.  Please see the attached documents for additional preventive care recommendations.

## 2024-04-27 NOTE — Progress Notes (Signed)
 "  Please attest this visit in the absence of patient primary care provider.   Chief Complaint  Patient presents with   Medicare Wellness     Subjective:   [name] is a 74 y.o. female who presents for a Medicare Annual Wellness Visit.  Visit info / Clinical Intake: Medicare Wellness Visit Type:: Subsequent Annual Wellness Visit Persons participating in visit and providing information:: patient Medicare Wellness Visit Mode:: Telephone If telephone:: video error Since this visit was completed virtually, some vitals may be partially provided or unavailable. Missing vitals are due to the limitations of the virtual format.: Unable to obtain vitals - no equipment If Telephone or Video please confirm:: I connected with patient using audio/video enable telemedicine. I verified patient identity with two identifiers, discussed telehealth limitations, and patient agreed to proceed. Patient Location:: home Provider Location:: office Interpreter Needed?: No Pre-visit prep was completed: yes AWV questionnaire completed by patient prior to visit?: no Living arrangements:: lives with spouse/significant other Patient's Overall Health Status Rating: (!) fair Typical amount of pain: (!) a lot Does pain affect daily life?: (!) yes Are you currently prescribed opioids?: no  Dietary Habits and Nutritional Risks How many meals a day?: 3 Eats fruit and vegetables daily?: yes Most meals are obtained by: preparing own meals In the last 2 weeks, have you had any of the following?: (!) nausea, vomiting, diarrhea (has intermittent diarrhea / constipation. Taking Miralax) Diabetic:: no  Functional Status Activities of Daily Living (to include ambulation/medication): Independent Ambulation: Independent with device- listed below Home Assistive Devices/Equipment: Walker (specify Type) Medication Administration: Independent Home Management (perform basic housework or laundry): Needs assistance (comment)  (pt does what she can when she is physically able. Spouse helps with chores / meals) Manage your own finances?: yes Primary transportation is: family / friends Concerns about vision?: no *vision screening is required for [facility]* (Up to date with [facility]) Concerns about hearing?: no  Fall Screening Falls in the past year?: 0 Number of falls in past year: 0 Was there an injury with Fall?: 0 Fall Risk Category Calculator: 0 Patient Fall Risk Level: Low Fall Risk  Fall Risk Patient at Risk for Falls Due to: Impaired mobility Fall risk Follow up: Falls evaluation completed  Home and Transportation Safety: All rugs have non-skid backing?: yes All stairs or steps have railings?: yes Grab bars in the bathtub or shower?: yes Have non-skid surface in bathtub or shower?: yes Good home lighting?: yes Regular seat belt use?: yes Hospital stays in the last year:: no  Cognitive Assessment Difficulty concentrating, remembering, or making decisions? : no Will 6CIT or Mini Cog be Completed: yes What year is it?: 0 points What month is it?: 3 points (stated February) Give patient an address phrase to remember (5 components): [address], [place]  About what time is it?: 0 points Count backwards from 20 to 1: 0 points Say the months of the year in reverse: 0 points Repeat the address phrase from earlier: 0 points 6 CIT Score: 3 points  Advance Directives (For Healthcare) Does Patient Have a Medical Advance Directive?: Yes Does patient want to make changes to medical advance directive?: No - Patient declined Type of Advance Directive: Healthcare Power of [place]; Living will Copy of Healthcare Power of Attorney in Chart?: No - copy requested Copy of Living Will in Chart?: No - copy requested  Reviewed/Updated  Reviewed/Updated: Reviewed All (Medical, Surgical, Family, Medications, Allergies, Care Teams, Patient Goals)    Allergies (verified) Compazine [prochlorperazine],  Morphine and codeine , and Trintellix [vortioxetine]   Current Medications (verified) Outpatient Encounter Medications as of 04/27/2024  Medication Sig   amLODipine  (NORVASC ) 5 MG tablet TAKE 1 TABLET (5 MG TOTAL) BY MOUTH DAILY.   clonazePAM  (KLONOPIN ) 1 MG tablet Take 0.5 tablets (0.5 mg total) by mouth 2 (two) times daily as needed for anxiety. TAKE 1 TABLET BY MOUTH THREE TIMES A DAY AS NEEDED FOR ANXIETY   escitalopram  (LEXAPRO ) 10 MG tablet Take 1 tablet (10 mg total) by mouth daily.   gabapentin  (NEURONTIN ) 100 MG capsule TAKE 1 CAPSULE (100 MG TOTAL) BY MOUTH IN THE MORNING, AT NOON, AND AT BEDTIME.   gabapentin  (NEURONTIN ) 300 MG capsule Take 1 capsule (300 mg total) by mouth 3 (three) times daily.   mirtazapine  (REMERON ) 45 MG tablet Take 45 mg by mouth at bedtime.    montelukast  (SINGULAIR ) 10 MG tablet Take 1 tablet (10 mg total) by mouth at bedtime.   Multiple Vitamin (MULTIVITAMIN) capsule Take 1 capsule by mouth daily. Dynamic brain   OVER THE COUNTER MEDICATION Take 2 capsules by mouth daily. NEUROCEPT   polyethylene glycol (MIRALAX / GLYCOLAX) 17 g packet Take 17 g by mouth every morning.   tirzepatide  (ZEPBOUND ) 7.5 MG/0.5ML Pen Inject 7.5 mg into the skin once a week.   traMADol  (ULTRAM ) 50 MG tablet TAKE 1 TABLET BY MOUTH EVERY 12 HOURS AS NEEDED   [DISCONTINUED] amoxicillin -clavulanate (AUGMENTIN ) 875-125 MG tablet Take 1 tablet by mouth 2 (two) times daily.   [DISCONTINUED] Misc Natural Products (NEURIVA PO) Take by mouth.   Facility-Administered Encounter Medications as of 04/27/2024  Medication   albuterol  (PROVENTIL ) (2.5 MG/3ML) 0.083% nebulizer solution 2.5 mg    History: Past Medical History:  Diagnosis Date   AKI (acute kidney injury) 01/09/2020   Allergy    seasonal   Anxiety    Arachnoiditis    Cataract    Depression    Elevated liver enzymes    Environmental allergies    GERD (gastroesophageal reflux disease)    H/O being hospitalized 03/2012   x1  week for nausea   History of chicken pox    Hyperlipidemia    Hypertension    Migraine with typical aura    resolved years ago   Nausea    chronic nausea    Neuromuscular disorder (HCC)    Pneumonia 03/02/2022   Past Surgical History:  Procedure Laterality Date   ABDOMINOPLASTY  07/11/09   EUS N/A 05/05/2012   Procedure: UPPER ENDOSCOPIC ULTRASOUND (EUS) LINEAR;  Surgeon: Belvie JONETTA Just, MD;  Location: [facility];  Service: Endoscopy;  Laterality: N/A;   LAPAROSCOPIC CHOLECYSTECTOMY  2010   NASAL SINUS SURGERY     Has had 2-3 surgeries over the years   UMBILICAL HERNIA REPAIR N/A 10/06/2020   Procedure: UMBILICAL HERNIA REPAIR;  Surgeon: Vernetta Berg, MD;  Location:  SUR[facility]ERY [facility]ENTER;  Service: General;  Laterality: N/A;  LMA ANESTHESIA   Family History  Problem Relation Age of Onset   Alzheimer's disease Mother    Hypertension Mother    Migraines Mother    Hypertension Father    Other Father        ?carotid artery aneurysm   Cancer Brother        mouth and throat   Colon cancer Neg Hx    Social History   Occupational History   Not on file  Tobacco Use   Smoking status: Former    Current packs/day: 0.00    Average  packs/day: 0.3 packs/day for 8.0 years (2.0 ttl pk-yrs)    Types: Cigarettes    Start date: 08/09/1978    Quit date: 08/09/1986    Years since quitting: 37.7   Smokeless tobacco: Never  Vaping Use   Vaping status: Never Used  Substance and Sexual Activity   Alcohol use: No   Drug use: No   Sexual activity: Not Currently    Partners: Male    Birth control/protection: Other-see comments, Post-menopausal    Comment: husband had vasectomy.    Tobacco Counseling Counseling given: Not Answered  SDOH Screenings   Food Insecurity: No Food Insecurity (04/27/2024)  Housing: Low Risk (04/27/2024)  Transportation Needs: No Transportation Needs (04/27/2024)  Utilities: Not At Risk (04/27/2024)  Alcohol Screen: Low Risk (04/26/2023)  Depression  (PHQ2-9): Low Risk (04/27/2024)  Financial Resource Strain: Low Risk (10/06/2023)  Physical Activity: Sufficiently Active (04/27/2024)  Social Connections: Unknown (04/27/2024)  Stress: No Stress Concern Present (04/27/2024)  Tobacco Use: Medium Risk (04/27/2024)  Health Literacy: Adequate Health Literacy (04/26/2023)   See flowsheets for full screening details  Depression Screen PHQ 2 & 9 Depression Scale- Over the past 2 weeks, how often have you been bothered by any of the following problems? Little interest or pleasure in doing things: 0 Feeling down, depressed, or hopeless (PHQ Adolescent also includes...irritable): 1 PHQ-2 Total Score: 1 Trouble falling or staying asleep, or sleeping too much: 0 Feeling tired or having little energy: 1 (medication sometimes makes her sleepy) Poor appetite or overeating (PHQ Adolescent also includes...weight loss): 0 Feeling bad about yourself - or that you are a failure or have let yourself or your family down: 1 (because I can't do all that I used to do) Trouble concentrating on things, such as reading the newspaper or watching television (PHQ Adolescent also includes...like school work): 0 Moving or speaking so slowly that other people could have noticed. Or the opposite - being so fidgety or restless that you have been moving around a lot more than usual: 0 Thoughts that you would be better off dead, or of hurting yourself in some way: 0 PHQ-9 Total Score: 3 If you checked off any problems, how difficult have these problems made it for you to do your work, take care of things at home, or get along with other people?: Not difficult at all  Depression Treatment Depression Interventions/Treatment : Currently on Treatment     Goals Addressed   None          Objective:    Today's Vitals   04/27/24 1507  Weight: 137 lb (62.1 kg)  Height: 5' 1 (1.549 m)   Body mass index is 25.89 kg/m.  Hearing/Vision screen No results  found. Immunizations and Health Maintenance Health Maintenance  Topic Date Due   DTaP/Tdap/Td (2 - Tdap) 06/07/2018   COVID-19 Vaccine (10 - Pfizer risk 2025-26 season) 08/04/2024   Medicare Annual Wellness (AWV)  04/27/2025   Mammogram  05/25/2025   Colonoscopy  08/06/2025   Pneumococcal Vaccine: 50+ Years  Completed   Influenza Vaccine  Completed   Bone Density Scan  Completed   Hepatitis C Screening  Completed   Zoster Vaccines- Shingrix   Completed   Meningococcal B Vaccine  Aged Out        Assessment/Plan:  This is a routine wellness examination for [name].  Patient Care Team: Daryl Setter, NP as PCP - General (Internal Medicine) Verneda LELON Mix, MD (Inactive) as Referring Physician (Plastic Surgery) Reino Niels CROME, MD as Referring  Physician (Neurology) Kristie Lamprey, MD as Consulting Physician (Gastroenterology) [place], Tawni CROME, MD as Referring Physician (Dermatology)  I have personally reviewed and noted the following in the patients chart:   Medical and social history Use of alcohol, tobacco or illicit drugs  Current medications and supplements including opioid prescriptions. Functional ability and status Nutritional status Physical activity Advanced directives List of other physicians Hospitalizations, surgeries, and ER visits in previous 12 months Vitals Screenings to include cognitive, depression, and falls Referrals and appointments  No orders of the defined types were placed in this encounter.  In addition, I have reviewed and discussed with patient certain preventive protocols, quality metrics, and best practice recommendations. A written personalized care plan for preventive services as well as general preventive health recommendations were provided to patient.   Lolita Libra, CMA   04/27/2024   Return in 1 year (on 04/27/2025).  After Visit Summary: (MyChart) Due to this being a telephonic visit, the after visit summary with patients  personalized plan was offered to patient via MyChart   Nurse Notes: HM Addressed: Vaccines Given today: Tetanus Mammogram scheduled DEXA ordered  "

## 2024-04-30 ENCOUNTER — Other Ambulatory Visit (HOSPITAL_BASED_OUTPATIENT_CLINIC_OR_DEPARTMENT_OTHER): Payer: Self-pay

## 2024-05-01 ENCOUNTER — Other Ambulatory Visit (HOSPITAL_COMMUNITY): Payer: Self-pay

## 2024-05-01 ENCOUNTER — Telehealth: Payer: Self-pay

## 2024-05-01 NOTE — Telephone Encounter (Signed)
.  Pharmacy Patient Advocate Encounter   Received notification from Physician's Office that prior authorization for Zepbound  is required/requested.   Insurance verification completed.   The patient is insured through [employer].   Per test claim: Per test claim, medication is not covered due to plan/benefit exclusion, PA not submitted at this time

## 2024-05-02 ENCOUNTER — Ambulatory Visit: Admitting: Family

## 2024-05-03 ENCOUNTER — Other Ambulatory Visit (HOSPITAL_BASED_OUTPATIENT_CLINIC_OR_DEPARTMENT_OTHER): Payer: Self-pay

## 2024-05-03 ENCOUNTER — Ambulatory Visit: Admitting: Family Medicine

## 2024-05-23 ENCOUNTER — Ambulatory Visit: Admitting: Family

## 2024-05-28 ENCOUNTER — Ambulatory Visit (HOSPITAL_BASED_OUTPATIENT_CLINIC_OR_DEPARTMENT_OTHER)

## 2024-09-26 ENCOUNTER — Ambulatory Visit: Admitting: Physician Assistant

## 2025-04-29 ENCOUNTER — Ambulatory Visit
